# Patient Record
Sex: Female | Born: 1935 | Race: White | Hispanic: No | Smoking: Never smoker
Health system: Southern US, Community
[De-identification: ages and names within clinical notes are randomized; demographics above are authoritative.]

## PROBLEM LIST (undated history)

## (undated) DIAGNOSIS — E43 Unspecified severe protein-calorie malnutrition: Secondary | ICD-10-CM

## (undated) DIAGNOSIS — K449 Diaphragmatic hernia without obstruction or gangrene: Secondary | ICD-10-CM

## (undated) DIAGNOSIS — D369 Benign neoplasm, unspecified site: Secondary | ICD-10-CM

## (undated) DIAGNOSIS — E785 Hyperlipidemia, unspecified: Secondary | ICD-10-CM

## (undated) DIAGNOSIS — Z800 Family history of malignant neoplasm of digestive organs: Secondary | ICD-10-CM

## (undated) DIAGNOSIS — I10 Essential (primary) hypertension: Secondary | ICD-10-CM

## (undated) DIAGNOSIS — I4892 Unspecified atrial flutter: Secondary | ICD-10-CM

## (undated) DIAGNOSIS — Z950 Presence of cardiac pacemaker: Secondary | ICD-10-CM

## (undated) DIAGNOSIS — I495 Sick sinus syndrome: Secondary | ICD-10-CM

## (undated) DIAGNOSIS — K5730 Diverticulosis of large intestine without perforation or abscess without bleeding: Secondary | ICD-10-CM

## (undated) DIAGNOSIS — I480 Paroxysmal atrial fibrillation: Secondary | ICD-10-CM

## (undated) HISTORY — DX: Hyperlipidemia, unspecified: E78.5

## (undated) HISTORY — DX: Presence of cardiac pacemaker: Z95.0

## (undated) HISTORY — PX: ABDOMINAL SURGERY: SHX537

## (undated) HISTORY — DX: Unspecified atrial flutter: I48.92

## (undated) HISTORY — DX: Paroxysmal atrial fibrillation: I48.0

## (undated) HISTORY — DX: Sick sinus syndrome: I49.5

## (undated) HISTORY — PX: ESOPHAGOGASTRODUODENOSCOPY: SHX1529

## (undated) HISTORY — DX: Family history of malignant neoplasm of digestive organs: Z80.0

## (undated) HISTORY — DX: Diverticulosis of large intestine without perforation or abscess without bleeding: K57.30

## (undated) HISTORY — DX: Diaphragmatic hernia without obstruction or gangrene: K44.9

## (undated) HISTORY — DX: Benign neoplasm, unspecified site: D36.9

## (undated) HISTORY — DX: Essential (primary) hypertension: I10

## (undated) HISTORY — PX: PARTIAL HYSTERECTOMY: SHX80

---

## 2001-06-19 ENCOUNTER — Ambulatory Visit (HOSPITAL_COMMUNITY): Admission: RE | Admit: 2001-06-19 | Discharge: 2001-06-19 | Payer: Self-pay | Admitting: Family Medicine

## 2001-06-19 ENCOUNTER — Encounter: Payer: Self-pay | Admitting: Family Medicine

## 2001-11-23 ENCOUNTER — Inpatient Hospital Stay (HOSPITAL_COMMUNITY): Admission: EM | Admit: 2001-11-23 | Discharge: 2001-11-25 | Payer: Self-pay | Admitting: Emergency Medicine

## 2001-11-24 ENCOUNTER — Encounter: Payer: Self-pay | Admitting: Cardiology

## 2001-11-28 ENCOUNTER — Ambulatory Visit (HOSPITAL_COMMUNITY): Admission: RE | Admit: 2001-11-28 | Discharge: 2001-11-28 | Payer: Self-pay | Admitting: Internal Medicine

## 2002-05-09 ENCOUNTER — Ambulatory Visit (HOSPITAL_COMMUNITY): Admission: RE | Admit: 2002-05-09 | Discharge: 2002-05-09 | Payer: Self-pay | Admitting: Internal Medicine

## 2002-06-26 ENCOUNTER — Ambulatory Visit (HOSPITAL_COMMUNITY): Admission: RE | Admit: 2002-06-26 | Discharge: 2002-06-26 | Payer: Self-pay | Admitting: Family Medicine

## 2002-06-26 ENCOUNTER — Encounter: Payer: Self-pay | Admitting: Family Medicine

## 2003-08-28 ENCOUNTER — Encounter: Payer: Self-pay | Admitting: Family Medicine

## 2003-08-28 ENCOUNTER — Ambulatory Visit (HOSPITAL_COMMUNITY): Admission: RE | Admit: 2003-08-28 | Discharge: 2003-08-28 | Payer: Self-pay | Admitting: Family Medicine

## 2003-11-14 HISTORY — PX: HIATAL HERNIA REPAIR: SHX195

## 2004-03-16 ENCOUNTER — Ambulatory Visit (HOSPITAL_COMMUNITY): Admission: RE | Admit: 2004-03-16 | Discharge: 2004-03-16 | Payer: Self-pay | Admitting: Internal Medicine

## 2004-09-09 ENCOUNTER — Ambulatory Visit (HOSPITAL_COMMUNITY): Admission: RE | Admit: 2004-09-09 | Discharge: 2004-09-09 | Payer: Self-pay | Admitting: Family Medicine

## 2005-08-25 ENCOUNTER — Ambulatory Visit (HOSPITAL_COMMUNITY): Admission: RE | Admit: 2005-08-25 | Discharge: 2005-08-25 | Payer: Self-pay | Admitting: Family Medicine

## 2005-10-27 ENCOUNTER — Ambulatory Visit (HOSPITAL_COMMUNITY): Admission: RE | Admit: 2005-10-27 | Discharge: 2005-10-27 | Payer: Self-pay | Admitting: Family Medicine

## 2005-11-17 ENCOUNTER — Encounter: Admission: RE | Admit: 2005-11-17 | Discharge: 2005-11-17 | Payer: Self-pay | Admitting: Surgery

## 2005-11-28 ENCOUNTER — Emergency Department (HOSPITAL_COMMUNITY): Admission: EM | Admit: 2005-11-28 | Discharge: 2005-11-28 | Payer: Self-pay | Admitting: Emergency Medicine

## 2006-04-03 ENCOUNTER — Inpatient Hospital Stay (HOSPITAL_COMMUNITY): Admission: RE | Admit: 2006-04-03 | Discharge: 2006-04-06 | Payer: Self-pay | Admitting: Surgery

## 2006-06-28 HISTORY — PX: RADIOFREQUENCY ABLATION: SHX2290

## 2006-11-02 ENCOUNTER — Ambulatory Visit (HOSPITAL_COMMUNITY): Admission: RE | Admit: 2006-11-02 | Discharge: 2006-11-02 | Payer: Self-pay | Admitting: Family Medicine

## 2007-02-08 ENCOUNTER — Ambulatory Visit (HOSPITAL_COMMUNITY): Admission: RE | Admit: 2007-02-08 | Discharge: 2007-02-08 | Payer: Self-pay | Admitting: Family Medicine

## 2008-04-30 ENCOUNTER — Ambulatory Visit (HOSPITAL_COMMUNITY): Admission: RE | Admit: 2008-04-30 | Discharge: 2008-04-30 | Payer: Self-pay | Admitting: Family Medicine

## 2009-01-08 ENCOUNTER — Ambulatory Visit (HOSPITAL_COMMUNITY): Admission: RE | Admit: 2009-01-08 | Discharge: 2009-01-08 | Payer: Self-pay | Admitting: Family Medicine

## 2009-02-17 ENCOUNTER — Encounter (INDEPENDENT_AMBULATORY_CARE_PROVIDER_SITE_OTHER): Payer: Self-pay | Admitting: *Deleted

## 2009-04-22 ENCOUNTER — Ambulatory Visit (HOSPITAL_COMMUNITY): Admission: RE | Admit: 2009-04-22 | Discharge: 2009-04-22 | Payer: Self-pay | Admitting: Family Medicine

## 2010-07-29 ENCOUNTER — Ambulatory Visit: Payer: Self-pay | Admitting: Internal Medicine

## 2010-08-15 ENCOUNTER — Ambulatory Visit (HOSPITAL_COMMUNITY): Admission: RE | Admit: 2010-08-15 | Discharge: 2010-08-15 | Payer: Self-pay | Admitting: Family Medicine

## 2010-08-17 ENCOUNTER — Ambulatory Visit (HOSPITAL_COMMUNITY): Admission: RE | Admit: 2010-08-17 | Discharge: 2010-08-17 | Payer: Self-pay | Admitting: Internal Medicine

## 2010-08-17 ENCOUNTER — Ambulatory Visit: Payer: Self-pay | Admitting: Internal Medicine

## 2010-08-17 DIAGNOSIS — K219 Gastro-esophageal reflux disease without esophagitis: Secondary | ICD-10-CM | POA: Insufficient documentation

## 2010-08-17 HISTORY — PX: COLONOSCOPY: SHX174

## 2010-08-19 ENCOUNTER — Encounter: Payer: Self-pay | Admitting: Internal Medicine

## 2010-08-23 ENCOUNTER — Ambulatory Visit (HOSPITAL_COMMUNITY): Admission: RE | Admit: 2010-08-23 | Discharge: 2010-08-23 | Payer: Self-pay | Admitting: Internal Medicine

## 2010-08-30 ENCOUNTER — Encounter: Payer: Self-pay | Admitting: Internal Medicine

## 2010-12-04 ENCOUNTER — Encounter: Payer: Self-pay | Admitting: Family Medicine

## 2010-12-13 NOTE — Letter (Signed)
Summary: EGD/TCS ORDER  EGD/TCS ORDER   Imported By: Ave Filter 07/29/2010 14:33:45  _____________________________________________________________________  External Attachment:    Type:   Image     Comment:   External Document

## 2010-12-13 NOTE — Letter (Signed)
Summary: DR Carolynn Sayers REFERRAL  DR Carolynn Sayers REFERRAL   Imported By: Ave Filter 08/30/2010 10:08:12  _____________________________________________________________________  External Attachment:    Type:   Image     Comment:   External Document

## 2010-12-13 NOTE — Letter (Signed)
Summary: Patient Notice, Colon Biopsy Results  [facility]  [address]   [place], [place] [address]   Phone: [phone]  Fax: [phone]       August 19, 2010   Anne Anthony [address] [place], [place]  [address] 07-27-1936    Dear [name],  I am pleased to inform you that the biopsies taken during your recent colonoscopy did not show any evidence of cancer upon pathologic examination.  Additional information/recommendations:  You should have a repeat colonoscopy examination  in 5 years.  Please call us if you are having persistent problems or have questions about your condition that have not been fully answered at this time.  Sincerely,    R. Roetta Sessions MD, FACP [facility] Ph: [phone]    Fax: [phone]   Appended Document: Patient Notice, Colon Biopsy Results letter mailed to pt  Appended Document: Patient Notice, Colon Biopsy Results REMINDER IN COMPUTER

## 2010-12-13 NOTE — Assessment & Plan Note (Signed)
Summary: GERD/ACID REFLUX/SS   Visit Type:  Initial Consult Primary Care Provider:  golding  Chief Complaint:  gerd.  History of Present Illness: 75 year old lady referred by Dr. Phillips Odor  to further evaluate "heartburn". Long history of GERD. History of hiatal hernia and paraesophageal hernia. Status post attempted laparoscopic repair by Dr. Carolynn Sayers at [facility] back in 2005; procedure had to be aborted because of  ST segment changes on EKG caused by pneumoperitoneum. A gastropexy was performed with a fixation type gastrostomy tube as a fall back maneuver..  Prior history of dysphagia / status post Maloney dilation by me. Currently not having any  dysphagia whatsoever. She denies typical reflux symptoms. She does state that over the past year or so she's had trouble with intermittent episodes of postprandial vomiting and nausea. The latter symptom may persist for a day or so. She cites 2-3 episodes monthly. At other times, she remains really entirely asymptomatic. She has been on Prilosec 20 mg orally daily for the last year or so and this agent  has not affected her symptoms in any way. She denies constipation,  rectal bleeding or melena. She has personal history of a colonic adenoma removed back in 2003 by me. She has a positive family history of a  brother diagnosed with colon cancer at [place] age. It is notable she has not had a followup colonoscopy.  Preventive Screening-Counseling & Management  Alcohol-Tobacco     Smoking Status: never  Current Medications (verified): 1)  Simvastatin 40 Mg Tabs (Simvastatin) .... Once Daily 2)  Amlodipine Besylate 5 Mg Tabs (Amlodipine Besylate) .... Once Daily 3)  Metformin Hcl 500 Mg Tabs (Metformin Hcl) .... Once Daily 4)  Lisinopril 10 Mg Tabs (Lisinopril) .... Once Daily 5)  Aspirin 81 Mg Tbec (Aspirin) .... Once Daily 6)  Prilosec 20 Mg Cpdr (Omeprazole) .... Once Daily 7)  Fish Oil 1000 Mg Caps (Omega-3 Fatty Acids) .... Once Daily 8)  Vitamin D  3  400iu .... Once Daily  Allergies (verified): No Known Drug Allergies  Past History:  Past Medical History: skin cancer Diabetes GERD Hyperlipidemia Hypertension  Past Surgical History: tubal ligation partial hyst diaphram ? heart surgery [facility]cs 6 years ago  Family History: Father:deceased  Mother:deceased  Siblings: 4 brothers, no sisters No FH of Colon Cancer:  Social History: Marital Status: widow Children: 4 Occupation: retired Patient has never smoked.  Alcohol Use - no Smoking Status:  never  Vital Signs:  Patient profile:   75 year old female Height:      64 inches Weight:      121 pounds BMI:     20.84 Temp:     98.8 degrees F oral Pulse rate:   80 / minute BP sitting:   128 / 80  (left arm) Cuff size:   regular  Vitals Entered By: Hendricks Limes LPN (July 29, 2010 11:05 AM)  Physical Exam  General:  Well developed, well nourished, no acute distress. Eyes:  no sckeral icterus Neck:  Supple; no masses or thyromegaly. Lungs:  clear Heart:  rrs m/g/r Abdomen:  flat; bs , soft, nontendrr s mass or organomeagaly  Impression & Recommendations: Impression: Very pleasant 75 year old lady with a history of intermittent postprandial vomiting and protracted nausea. She is status post partial repair of hiatal hernia and paraesophageal  hernia with a gastropexy and fixation gastrostomy over at [facility] several years ago. She has no dysphagia. Her symptoms are somewhat unusual for gastroesophageal reflux disease although she may well have this  disease. Her symptoms really do not seem biliary to me. She has no lower GI tract symptoms. She is overdue for surveillance colonoscopy.  Recommendations: Diagnostic EGD and surveillance colonoscopy in the very near future. The risks, benefits, limitations, alternatives and imponderables have been reviewed. Her questions were answered. She is agreeable. We'll make further recommendations in the very near future.  Thank  to Dr. Phillips Odor for his kind referral of this nicely lady once again.  Appended Document: Orders Update    Clinical Lists Changes  Problems: Added new problem of GERD (ICD-530.81) Added new problem of NEOPLASM, MALIGNANT, COLORECTAL, FAMILY HX (ICD-V16.0) Orders: Added new Service order of New Patient Level IV (61607) - Signed

## 2011-03-31 NOTE — Discharge Summary (Signed)
NAME:  Anne Anthony, Anne Anthony               ACCOUNT NO.:  [id number]   MEDICAL RECORD NO.:  [id number]          PATIENT TYPE:  INP   LOCATION:  1608                         FACILITY:  [facility]   PHYSICIAN:  Sandria Bales. Ezzard Standing, M.D.  DATE OF BIRTH:  August 29, 1936   DATE OF ADMISSION:  04/03/2006  DATE OF DISCHARGE:  04/06/2006                                 DISCHARGE SUMMARY   DISCHARGE DIAGNOSES:  1.  Large hiatal hernia.  2.  Paroxysmal atrial flutter.  3.  Hypertension.  4.  Hyperlipidemia.   OPERATIONS PERFORMED:  Patient had laparoscopic repair of hiatal hernia with  biologic mesh and upper endoscopy on the 22nd of May 2007.   HISTORY OF ILLNESS:  Anne Anthony is a 75 year old white female who  underwent an attempt at hiatal hernia repair by Dr. Francee Gentile at [facility] in May of 2005.  Apparently during the procedure she had a cardiac  difficulty which limited the procedure he could do.  He ended up putting a  single stitch on the posterior crura and endoscopic gastrostomy, but could  not actually repair her hernia.   At that time the hiatal hernia measured 4 x 7 cm.  She has, however, had  progressive symptoms of dysphagia, bloating, nausea, and upper GI which  shows that about 80% of her stomach is now in this hiatal hernia.   She comes for intensive hiatal hernia repair during this hospitalization.  She has been evaluated by Dr. Yates Decamp and found to have paroxysmal atrial  tachycardia/fibrillation.  Has been placed on Coumadin for this which she  stopped about five days before surgery.   On the day of surgery she presented to the hospital.  Her coags were checked  and returned to normal with a PT of 59, INR 1.2, PTT of 27.  Her hemoglobin  was 14, hematocrit 42.   She was taken to the operating room where she underwent attempted  laparoscopic repair of her hiatal hernia.  What was found at the time of  surgery was a large hiatal defect which measured about 6 x 7 cm in  size.  Her stomach probably 70-80% of it was above this hiatus.  The gastrostomy  tube that had been placed stretch on the stomach but it had done very little  as far as preventing herniation of the stomach above the diaphragm into the  hiatal hernia.   I spent several hours trying to get down the stomach and mobilize and  visualize the esophagus but basically the esophagus and proximal stomach  were densely adhered in the mediastinum well above the upper part of the  heart and working through this hiatus laparoscopically.  It seemed  impractical to me to try to reduce the entire stomach or even lengthen the  esophagus enough to get it down to the hiatus.  I did endoscope the patient  during the procedure.  This showed an angulation of the esophagus into the  stomach and actually retained food material in the proximal stomach ouch.   Therefore, I reduced as much of the stomach down  as I could below the  hiatus.  I was estimating that I had about 70-80% of the stomach below the  hiatus, maybe 20-30% above the hiatus, and then closed the hiatus with an 8  x 12 cm patch of MTF mesh which is a biologic mesh.   This was also sewn to the stomach to try to prevent it from herniating back  up into this hiatus.   The patient tolerated procedure well, but was kept in the intensive care  unit for 24 hours because of her prior cardiac difficulty.  On the first  postoperative day I did an upper GI.  This showed poor distal gastric  motility, angulation of her distal esophagus, a majority of her stomach  still remained above her hiatus.  So what stomach I thought I had reduced  was not reduce as much as I thought but the stomach did seem to empty  through the hiatal repair.   She was started on liquids on the second postoperative day.  By the third  postoperative day I placed her on a puree diet.  I think she is ready to go  home.  Her temperature is 98.1, pulse is 66, blood pressure is 137/71.   Her  white blood count was down to 11,200, hemoglobin 11.6, hematocrit 34.   Her lungs are clear.  Her incisions look good.  She is, again, tolerating  the puree diet and she is ready for discharge.   DISCHARGE INSTRUCTIONS:  Resuming her home medications which include her  Coumadin on a previous dose, the Toprol XL 100 mg p.o. daily, Lipitor 20 mg  q.h.s., and Prevacid 30 mg twice a day.  She is given Vicodin for pain.  She  is going to see me back in a week for staple removal.  She should make  appointment to see Dr. Jacinto Halim within seven to 14 days.  I think she has an  appointment the 2nd of June at [facility] for check her coags.   I certainly think as an overall summary that an abdominal approach to her  hiatal hernia is impracticable and if there was ever a consideration for  trying to reduce or get her stomach back down below her diaphragm it would  take a thoracic approach which at her age with her cardiac disease I do not  think it would be very desirable and probably keeping her some type of  modified diet which is liquid and pureed food may be the best as far as to  keep her stomach empty and limit her symptoms.  I think she also needs to be  on long-term proton-pump inhibitors and we discussed this with her.      Sandria Bales. Ezzard Standing, M.D.  Electronically Signed     DHN/MEDQ  D:  04/06/2006  T:  04/06/2006  Job:  [id number]   cc:   Patrica Duel, M.D.  Fax: [phone]   Cristy Hilts. Jacinto Halim, MD  Fax: [phone]

## 2011-03-31 NOTE — Op Note (Signed)
NAME:  Anne Anthony, Anne Anthony                         ACCOUNT NO.:  [id number]   MEDICAL RECORD NO.:  [id number]                   PATIENT TYPE:  AMB   LOCATION:  DAY                                  FACILITY:  [facility]   PHYSICIAN:  R. Roetta Sessions, M.D.              DATE OF BIRTH:  Mar 12, 1936   DATE OF PROCEDURE:  03/16/2004  DATE OF DISCHARGE:                                 OPERATIVE REPORT   PROCEDURE:  Diagnostic esophagogastroduodenoscopy.   INDICATIONS FOR PROCEDURE:  Patient is a 75 year old lady with a history of  progressive early satiety and dysphagia.  In January, 2003 I saw this lady  for dysphagia.  I felt she had an elastic-appearing LES on EGD.  She  underwent passage of a 33 French Maloney dilator with good improvement until  the past few months.  EGD is now being done.  This approach has been  discussed with the patient at length.  The potential risks, benefits and  alternatives have been reviewed, questions answered.   PROCEDURE NOTE:  The O2 saturation, blood pressure, pulse, respirations were  monitored throughout the entire procedure.   CONSCIOUS SEDATION:  1. Versed 2 mg IV.  2. Demerol 50 mg IV.  3. Cetacaine spray for topical oropharyngeal anesthesia.   INSTRUMENT:  Olympus video endoscopy system.   FINDINGS ON EXAMINATION OF THE TUBULAR ESOPHAGUS:  Revealed subjectively a  dilated esophagus and with lack of peristalsis the EG junction was wide  open.  There was no evidence of ring, stricture, neoplasm.  The mucosa  appeared normal.  EG junction easily traversed.  The remaining stomach,  colon, gastric cavity was empty, it insufflated.  There appeared to be a  large at least diaphragmatic hernia with a diaphragmatic hiatus noted down  at the level of the antrum.  There was a good part of the partial proximal  stomach, folded up back, best seen retroflexed proximally.  The mucosa  appeared normal.  The pylorus was identified and appeared patent, was easily  traversed.  Examination of the bulb and second portion revealed no  abnormalities.   THERAPEUTIC/DIAGNOSTIC MANEUVERS PERFORMED:  None.   Patient tolerated the procedure well, was __________ endoscopy.   IMPRESSION:  1. Somewhat dilated aperistaltic esophagus (endoscopic observation), normal     appearing mucosa.  2. Large hernia.  She may have a mixed hiatal and paraesophageal hernia.     Mucosa of the stomach appeared normal with patent pylorus, normal D1 and     D2.   RECOMMENDATIONS:  1. __________ esophagram/upper GI series to further evaluate the gross     anatomy of her upper GI tract.  Will plan for later today.  2. Will make further recommendations in the near future.      ___________________________________________  Jonathon Bellows, M.D.   RMR/MEDQ  D:  03/16/2004  T:  03/16/2004  Job:  [id number]   cc:   Patrica Duel, M.D.  [address], [address]  [place]  [place] [address]  Fax: [phone]

## 2011-03-31 NOTE — Op Note (Signed)
[facility]  Patient:    Anne Anthony, Anne Anthony Visit Number: [id number] MRN: [id number]          Service Type: END Location: DAY Attending Physician:  Jonathon Bellows Dictated by:   Roetta Sessions, M.D. Proc. Date: 05/09/02 Admit Date:  05/09/2002 Discharge Date: 05/09/2002   CC:         Patrica Duel, M.D.   Operative Report  PROCEDURE:  Colonoscopy and snare polypectomy.  ENDOSCOPIST:  Roetta Sessions, M.D.  INDICATIONS FOR PROCEDURE:  The patient is a 75 year old lady with no lower GI tract symptoms, no prior imaging of her colon, whose younger brother has a history of undergoing a colonic resection for colorectal carcinoma. Colonoscopy is now being done as a high-risk screening maneuver.  This approach has been discussed with the patient previously and again today at the bedside.  Potential risks, benefits and alternatives have been reviewed; the patient is agreeable.  Please see my handwritten H&P for more information.  I feel she is low risk for conscious sedation.  DESCRIPTION OF PROCEDURE:  O2 saturation, blood pressure, pulse and respirations were monitored throughout the entire procedure.  CONSCIOUS SEDATION:  Versed 3 mg IV, Demerol 75 mg IV in divided doses.  INSTRUMENT:  Olympus video chip colonoscopy.  FINDINGS:  Digital rectal exam revealed no abnormalities.  ENDOSCOPIC FINDINGS:  Prep was good.  Rectum:  Examination of the rectal mucosa including a retroflexed view of the anal verge revealed only internal hemorrhoids.  Colon:  Colonic mucosa was surveyed from the rectosigmoid junction through the left, transverse and right colon to the area of the appendiceal orifice, ileocecal valve and cecum.  These structures were well-seen and photographed. From this level, the scope was slowly withdrawn and all previously mentioned mucosal surfaces were again seen.  At 45 cm, there was a 0.75-cm sessile polyp on a fold; please see photos.   This was engaged with a snare and removed with snare cautery and recovered through the scope without difficulty.  No other abnormalities were observed.  The patient tolerated the procedure well and was reacted in endoscopy.  IMPRESSION: 1. Internal hemorrhoids, otherwise, normal rectum. 2. Polyp at 45 cm, removed with snare cautery.  Remainder of colonic mucosa    appeared normal.  RECOMMENDATIONS: 1. No aspirin or arthritis medications for the next 10 days. 2. Follow up on pathology. 3. Further recommendations to follow. Dictated by:   Roetta Sessions, M.D. Attending Physician:  Jonathon Bellows DD:  05/09/02 TD:  05/12/02 Job: [id number]

## 2011-03-31 NOTE — H&P (Signed)
[place]. [facility]  Patient:    Anne Anthony, Anne Anthony Visit Number: [id number] MRN: [id number]          Service Type: MED Location: [phone] Attending Physician:  Ruta Hinds Dictated by:   [facility], [facility]. Admit Date:  11/23/2001 Discharge Date: 11/25/2001   CC:         Jonell Cluck, M.D.  Dr. Marcy Salvo, GI, [place], [place]  Delrae Rend, M.D.  at [facility]   History and Physical  ADMITTING DIAGNOSES: 1. Unstable angina. 2. Hyperlipidemia. 3. Electrocardiogram going in and out of left bundle branch block.  DISCHARGE DIAGNOSES: 1. Chest pain questionable etiology.    a. Status post cardiac catheterization by Dr. Pearletha Furl. Alanda Amass on       November 25, 2001 with no significant coronary artery disease and normal       left ventricular function. 2. Hyperlipidemia. 3. Electrocardiogram going in and out of left bundle branch block.  HISTORY OF PRESENT ILLNESS:  Anne Anthony is a very pleasant 75 year old white female with no known CAD who was seen by Dr. Jacinto Halim several months ago in follow-up of an abnormal EKG.  She says that after seeing him she had an electrocardiogram and Cardiolite both performed, which apparently were negative.  She has no hypertension, no diabetes, no tobacco use, no family history of heart disease but does have hyperlipidemia on Lipitor.  She presented on November 23, 2001.  During that previous night at about 7 p.m., she was eating dinner when she had the onset of chest pain under both breasts with a drawing sensation up into her neck.  She did have some diaphoresis but no shortness of breath.  She developed some nausea and then had two episodes of emesis.  Pain continued under the left breast for about 30 to 60 minutes duration.  She had gone to the bathroom to remove her bra because it felt like it was tight and squeezing.  At that point, she had been at a restaurant  eating dinner.  However, they then went home.  After she got home, she felt fine until she went to bed.  She then woke up at some point during the night but was not having any chest pain or other symptoms.  When she woke up on the morning of November 23, 2001, she felt fine but her daughters wanted her to see Dr. Jonell Cluck and follow up of her episode. Her performed EKG and was concerned about her EKG and chest pain and planned for transfer to [facility].  In [facility] Emergency Room, she was without complaints at that point.  She said that she was having some more episodes of these about every two weeks.  She says they always occur while eating.  She works in [employer] and never has any chest discomfort or other associated symptoms when she is physically exerting herself there.  On exam at that time, her heart rate was 70, blood pressure 138/71.  No significant findings on her physical exam.  The EKG from June 2002 showed left bundle branch block.  The EKG from earlier the morning of admission on November 23, 2001 at 11:47 showed no left bundle branch block but did showed deep T-wave inversions in leads, II, III, aVF, V3 through V6.  Follow-up EKG later on November 23, 2001 on 12:19 showed a left bundle branch block similar to the one on May 06, 2001.  At that point, labs were pending.  At this point, she was seen and evaluated by Dr. Pearletha Furl. Alanda Amass.  It was felt that her symptoms were quite concerning for angina.  EKG showed her going in and out of her left bundle branch block.  As well as she was without bundle branch block, she had deep T-waves which were most likely secondary to polarization changes but could not rule out ischemia.  It was felt that given her significant symptoms and EKG changes that definitive cardiac catheterization was necessary.  As well, she had a negative Cardiolite this past summer and continued  to have problems with chest pain.  At that point, she was treated with aspirin, Plavix, IV heparin, Lopressor, and Lipitor, as well as prophylactically treated with a proton pump.  We checked serial enzymes to rule out MI and she was scheduled for cardiac catheterization on Monday.  HOSPITAL COURSE:  On November 24, 2001, she was without complaints and had no further chest pain.  Laboratories remained stable including negative enzymes. Exam remained benign.  She remained on medications and was awaiting her catheterization on Monday.  On November 25, 2001, Anne Anthony underwent cardiac catheterization by Dr. Pearletha Furl. Alanda Amass.  She was found to have an essentially normal coronaries. She does have some calcifications of the ostial LAD, as well as a 30% proximal mid-LAD lesion.  No other significant disease.  Normal LV function with an EF of 55%.  No MR.  At that time, it was felt that Dr. Pearletha Furl. Alanda Amass that her chest pain was most likely GI in etiology.  He felt that she did not need follow up with cardiology.  She continued her bed rest postcatheterization.  If her groin remained stable, she will be discharged home later this afternoon.  She will need to follow up with Dr. Jonell Cluck and also with gastroenterology in [place], [place].  We will send her home with a baby aspirin, add a proton pump inhibitor in addition to her Lipitor, which was her only previous medication.  She can follow up with [place] only on a p.r.n. basis.  CONSULTANTS:  None.  PROCEDURES:  Cardiac catheterization November 25, 2001 by Dr. Pearletha Furl. Alanda Amass.  This showed essentially normal coronary arteries.  There was some calcification of the proximal LAD.  There was a 30% stenosis in the proximal  mid-LAD.  No other significant CAD.  She had normal LV function with EF of 50-55%.  No MR.  LABORATORY DATA:  TSH normal at 0.909.  Lipid profile shows total cholesterol of 151, triglycerides  76, HDL 51, and LDL 85.  Cardiac enzymes negative x 3 with CKs of 77, 69, 58.  MB of 1.0, 1.2, and less than 0.3.  Troponin is 0.02, 0.01, and 0.01.  On admission, white count 6.9, hemoglobin 13.1, hematocrit 38.5, platelet 313,000.  These remained stable throughout the hospitalization. On September 15, 2002, PT 14.3, INR 1.2, and PTT 32.  On admission, sodium 140, potassium 3.7, glucose 101, BUN 12, creatinine 0.8.  These values all remained stable and normal throughout the hospitalization.  EKG showed intermittent left bundle branch block.  At the time of admission, her EKG from earlier that morning at Dr. Azzie Glatter office, which had been obtained on November 23, 2001 at 11:47, showed no left bundle branch block but did show deep T-wave inversion in leads II, III, aVF, as well as V3 through V6.  Follow-up EKG in  the emergency room on November 23, 2001 at 12:19 shows a left bundle branch block with no significant T-wave changes.  No significant compared to old EKG of May 06, 2001.  Chest x-ray November 24, 2001 shows COPD, slight cardiomegaly, and probable large hiatal hernia.  DISCHARGE MEDICATIONS: 1. Aspirin 81 mg q.d. 2. Lipitor 20 mg q.d. 3. Nexium 40 mg q.d.  ACTIVITY:  No strenuous activity or lifting greater than five pounds or driving for three days.  DIET:  Low salt, low fat, low cholesterol diet.  WOUND CARE:  May gently wash the groin with water and soap.  Call the office at [phone] if any bloody increase or pain in the groin.  FOLLOW-UP:  Follow up with Dr. Jacinto Halim only on a p.r.n. basis.  Call Dr. Azzie Glatter office today to get an appointment to see him in the next one to two weeks.  Also call Dr. Marcy Salvo for gastroenterology evaluation for the first available appointment. Dictated by:   [facility], [facility]. Attending Physician:  Ruta Hinds DD:  11/25/01 TD:  11/25/01 Job: [id number]

## 2011-03-31 NOTE — Op Note (Signed)
NAME:  Anne Anthony, Anne Anthony               ACCOUNT NO.:  [id number]   MEDICAL RECORD NO.:  [id number]          PATIENT TYPE:  INP   LOCATION:  0160                         FACILITY:  [facility]   PHYSICIAN:  Sandria Bales. Ezzard Standing, M.D.  DATE OF BIRTH:  1935-12-19   DATE OF PROCEDURE:  04/03/2006  DATE OF DISCHARGE:                                 OPERATIVE REPORT   PREOPERATIVE DIAGNOSIS:  Large recurrent hiatal hernia.   POSTOPERATIVE DIAGNOSIS:  Large recurrent hiatal hernia with extensive  mediastinal adhesions and approximately 6 x 7 cm hiatal hernia.   PROCEDURE:  Laparoscopic repair of hiatal hernia with 8 x 12 cm piece of MTF  mesh and upper endoscopy.   SURGEON:  Dr. Ezzard Standing   FIRST ASSISTANT:  Dr. Wenda Low   ANESTHESIA:  General with approximately 40 mL of 0.25% Marcaine in the port  sites.   INDICATION FOR PROCEDURE:  Anne Anthony is a 75 year old white female, who  underwent an attempted laparoscopic hiatal hernia repair by Dr. Francee Gentile  in May 2005.  During the procedure, apparently she had some cardiac  difficulty which had made him terminate the procedure before he could  complete it.  He was able to reduce the hiatal hernia.  He put a single  stitch in the posterior crura and then put an endoscopic gastrostomy tube  but could do no other repair of the hernia.   At that time, the hernia measured approximately 4 x 7 cm in his operative  note.  The patient has continued to have symptoms with nausea, vomiting,  burping, and upper GI shows approximately 80% of the stomach through a  hiatal hernia.  She now comes for attempted repair of this hiatal hernia and  reduction of her stomach back into her abdominal cavity.  The indication and  potential risks were explained to the patient.   Potential risks include but are not limited to bleeding, infection,  perforation or injury to the bowel, the inability to reduce the hiatal  hernia, the possibility of another gastrostomy tube,  and she understands  cardiac potential risks as she has been seen by Dr. Zetta Bills, who feels  comfortable she will be able to tolerate surgery.   OPERATIVE NOTE:  The patient placed in a supine position, given a general  endotracheal anesthetic.  Her abdomen was prepped with Betadine solution and  drapes.  She had a Foley catheter in place.  I accessed the abdominal cavity  through an infraumbilical laparoscopic incision in which I placed a 10 mm  laparoscope through a 12 mm Hasson trocar which I secured with a 0 Vicryl  suture.  I then placed a 10 mm port in her right upper quadrant, a 10 mm  port in her right paramedian quadrant, a 10 mm port in her left paramedian  at the old gastrostomy site, and a 5 mm port lateral.  Before placing the  port at the gastrostomy site, I did laparoscope her and took down her prior  gastrostomy tube placement with harmonic scalpel.  It was noted this had  done very little  to prevent the stomach from herniating back up into the  chest.   Then I took down adhesions which were actually not very significant in her  upper abdomen which involved both the undersurface of the liver, up around  the hiatus, and then I got into the mediastinum, and she had a fairly large  hiatal hernia.   I estimated measurement of the hiatal hernia was about 6 x 7 cm (this is the  defect in the diaphragm - the mediastinal sac that the stomach occupied was  much larger), so this was larger than what Dr. Carolynn Sayers had seen 2 years ago.  I was able to reduce approximately 60% of her stomach into her abdominal  cavity, but a lot of her stomach remained densely adhesed in her  mediastinum.  I first went along the lesser curvature side of the stomach  and dissected this way for awhile.  Then I went onto the greater curvature  of her stomach.  I then encircled her stomach and used the penrose for  exposure and dissection.  Somewhat interestingly it appeared that her left  gastric  vessels were pulled up through her hiatus, and this was all  plastered posteriorly.   We probably spent some 2-1/2 to 3 hours dissecting the stomach from the  mediastinum, going first up the lesser curvature, then up the greater  curvature in the mediastinum trying to get the stomach down, but I was never  able to mobilize the upper edges of the stomach or her esophagus which were  extremely high in her medistinum.   At this time, to help identify the esophagogastric junction, Dr. Daphine Deutscher  broke scrub and upper endoscoped the patient.  We switched places and I  upper endoscoped the patient.   I found several things.  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  Secondly, she had a lot of retained  food in her stomach, though she had been NPO.  I am guessing that a large  amount of her stomach in her chest had been very poor at gastric emptying  because of its tortuous nature.  Thirdly, she did have some hyperplastic  polyps in this proximal gastric pouch probably from chronic stasis.  I did  not biopsy or do anything to these polyps because of [place] doing our dissection  on the outside of the stomach.  Fourthly, I was able to get down to the  duodenum and cannulate what I think was the first portion of the duodenum  which was fairly unremarkable.  Her distal stomach was unremarkable, too.  Where I had taken down the gastrostomy, too, was not appreciated on  endoscopy.   At this point, it was clear that her esophagus was really high, probably up  midway in her mediastinum.  It would not be practicable to mobilize the  esophagus enough to get the esophagus down to the hiatus of her diaphragm.  We also thought there was a significant risk of causing an injury either to  the esophagus or stomach in doing so, and then we probably dissected 80% of the stomach free but again,  this prompt top 20% was really an almost  inaccessible area by going laparoscopically through her hiatal hernia.  So  at this point, I felt that the best position was to take what we had as far  as reduced stomach, use  a piece of absorbable mesh around her hiatus, and  make this close the hiatal hernia and tack this down to the stomach to  hopefully fix this piece of stomach so there was only a small part of the  stomach above her diaphragm.  This would then empty better, in a straighter  position when she sits up and eats.   I used an 8 x 12 cm piece of MTF mesh.  I tacked this using Tevdek sutures  along the diaphragm anteriorly but above the right crus laterally.  I then  actually clipped sort of a stellate star in the mesh and passed it  posteriorly so actually it encircled this whole kind of gastric pouch.   There appeared to be plenty of room that I had not made it so tight that  actually it was in itself was going to cause an obstruction.  I then tacked  it anteriorly to the stomach, again, hoping to fix even though I was leaving  some of the stomach above the diaphragm; I thought I had reduced at least  80% and tried to leave this as tube of stomach so this would then empty into  a more distal stomach which hopefully would empty.   I had some thoughts about placing a gastrostomy tube, but my concern was  since I had this mesh in there, I did a fairly large dissection of her  mediastinum which would have some fluid which would accumulate that seat  either the mesh or the fluid and contaminate it, as we had not had any  obvious esophageal or gastric injury and thought that a gastrostomy tube  would add very little as far as fixating the stomach, though it would give  [place] access to her stomach if she were to have some problems with this repair.   For the same reason that the gastrostomy tube did not seem to work, I  thought pexing her stomach would add nothing either and, again, we  were  really hoping that her distal stomach would empty in a normal fashion as  long as food presented to itself that far down.   So I then irrigated out the abdominal cavity.  Again, I had closed this; I  used the MTF mesh with the 2 row Tevdek suture, and I used 2-0 Vicryl suture  on the anterior stomach wall.   The patient tolerated the procedure well, was transported to the recovery  room in good condition.  Sponge and needle counts were correct at the end of  the case.  Each trocar was removed in turn.  The Nathanson retractor which  was used to retract the liver was removed.  The umbilical port closed with 0  Vicryl suture, the skin port was closed with skin staples and injected with  0.25% Marcaine.      Sandria Bales. Ezzard Standing, M.D.  Electronically Signed     DHN/MEDQ  D:  04/03/2006  T:  04/03/2006  Job:  [id number]   cc:   Patrica Duel, M.D.  Fax: [phone]   Cristy Hilts. Jacinto Halim, MD  Fax: [phone]

## 2011-03-31 NOTE — Cardiovascular Report (Signed)
[place]. [facility]  Patient:    Anne Anthony, Anne Anthony Visit Number: [id number] MRN: [id number]          Service Type: MED Location: [phone] Attending Physician:  Ruta Hinds Dictated by:   Pearletha Furl Alanda Amass, M.D. Proc. Date: 11/25/01 Admit Date:  11/23/2001   CC:         Jonell Cluck, M.D., c/o [facility], [place], [place]  Dr. Marline Backbone, c/o [facility], [place], [place]  Dr. Ilene Qua, [place] Office, [facility] Heart   Cardiac Catheterization  PROCEDURES:  Retrograde central aortic catheterization; selective coronary angiography by Judkins technique; left ventricular angiogram, RAO, LAO projection; abdominal aortic angiogram, midstream PA projection.  DESCRIPTION OF PROCEDURE:  The patient was brought to the second floor CHEST PAIN lab in a postabsorptive state after 5 mg Valium p.o. premedication.  She was given 2 mg of Nubain for sedation in the laboratory.  Catheterization was done with 6 [place], 4 cm tapered preformed Cordis coronary and pigtail catheters.  The procedure was done through a 6 [place] short Daig sidearm sheath that we placed in the CFRA with a single anterior puncture under modified Seldinger technique under 1% Xylocaine anesthesia.  LV angiogram was done in the RAO and LAO projection at 25 cc, 14 cc per second, and 20 cc, 12 cc per second, and pullback pressure at the CA showing no gradient across the aortic valve.  Abdominal aortic angiogram in the midstream PA projection was done at 25 cc, 20 cc per second above the level of the renal arteries and demonstrated patent celiac and SMA axis.  Normal single renal arteries bilaterally.  Very minimal atherosclerotic change in the mid-infrarenal abdominal aorta but otherwise smooth, no aneurysm or stenosis.  Normal common and external iliacs bilaterally with good runoff and intact hypogastric arteries.  The renal arteries were single and normal  bilaterally.  Catheters were removed, sidearm sheath was flushed.  The patient was brought to the holding area for sheath removal and pressure hemostasis.  Heparin was on hold at the catheterization lab, and ACT was 160 post procedure.  She tolerated the procedure well.  RESULTS:  Pressures:  LV:  134/0; LVEDP 16 mmHg.  CA:  134/55 mmHg.  There was no gradient across the aortic valve on catheter pullback.  Fluoroscopy revealed 2+ segmental calcification of the proximal LAD that was nonobstructive.  There was no significant coronary or intracardiac calcification.  The main left coronary was normal.  The left anterior descending was widely patent and smooth with no significant obstruction.  There was a mild 30% eccentric, smooth narrowing at the level of the second septal perforator at the junction of the proximal third of the LAD. There was good flow to the remainder of the LAD, which coursed to the apex of the heart and was normal.  There was a large twin first diagonal arising from the proximal third of the LAD just after its origin.  It trifurcated distally and was widely patent and normal.  The circumflex was a nondominant vessel with a moderate-sized normal OM-1 from the proximal portion, a small OM-2 from the midportion, and a normal distal circumflex and PAVG branch.  The right coronary was a moderately large, dominant vessel with a normal PDA arising just after the acute margin, normal PLA giving off an AV node branch, and normal RV branches from the midportion.  It was a widely patent, smooth, normal artery.  There was no coronary spasm visualized,  and there was good flow throughout the coronary tree bilaterally.  DISCUSSION:  This 75 year old married mother of four with four grandchildren is a nonsmoker.  She has a history of mild hypertension, hyperlipidemia on therapy, and is a nonsmoker.  She was seen by Dr. Nobie Putnam for atypical chest pain and predominant upper  GI reflux symptoms and possible symptoms of esophageal spasm or stricture.  She did, however, have intermittent left bundle branch block on EKG and with normal conduction had marked T-wave inversion.  It is not clear whether this was secondary to repolarization from her intermittent LBBB or related to ischemia.  Fortunately, the patient has essentially normal coronary arteries.  Left ventricle is essentially normal with an EF of 50-55% and no mitral regurgitation.  Chest x-ray shows a large hiatal hernia.  I would recommend empiric GI therapy at this point, reassurance about her coronary status, and probable consideration for GI consultation.  CATHETERIZATION DIAGNOSES: 1. Chest pain, etiology not determined, probable upper GI etiology. 2. Hiatal hernia on x-ray with symptoms of gastroesophageal reflux disease and    possible stricture. 3. Mild hypertension with normal renal arteries. 4. Essentially normal coronary arteries and left ventricle. 5. Hyperlipidemia, normalized on medical therapy. Dictated by:   Pearletha Furl Alanda Amass, M.D. Attending Physician:  Ruta Hinds DD:  11/26/71 TD:  11/25/01 Job: [id number]

## 2011-03-31 NOTE — Op Note (Signed)
[facility]  Patient:    Anne Anthony, Anne Anthony Visit Number: [id number] MRN: [id number]          Service Type: END Location: DAY Attending Physician:  Jonathon Bellows Dictated by:   Roetta Sessions, M.D. Proc. Date: 11/28/01 Admit Date:  11/28/2001 Discharge Date: 11/28/2001   CC:         Patrica Duel, M.D.   Operative Report  PROCEDURE:  Esophagogastroduodenoscopy with [facility] dilation.  GASTROENTEROLOGIST:  Roetta Sessions, M.D.  INDICATIONS:  The patient is an 75 year old lady who complains of intermittent esophageal silence and has had some recent atypical chest pain.  She was seen by cardiology and describes undergoing cardiac catheterization just three days ago down in [place], and she was told that she did not have any significant coronary disease.  She was started on Nexium three days ago.  She cannot tell any difference in her symptoms.  She currently does not have any chest pain this morning.  She denies any typical reflux symptoms.  Gallbladder remains intact.  Recent labs demonstrate essentially normal CBC and normal LFTs.  EGD is now being done to further evaluate here chest pain and esophageal dysphagia.  [name].  Potential risks, benefits, and alternatives have been reviewed and questions answered. She is agreeable.  Please see my handwritten H&P for more information.  PROCEDURE NOTE:  O2 saturation, blood pressure, and pulse for this patient were monitored throughout the entire procedure.  CONSCIOUS SEDATION:  Versed 3 mg IV, Demerol 50 mg IV in divided doses.  INSTRUMENT:  Olympus video chip gastroscope.  FINDINGS:   Examination of the tubular esophagus revealed no mucosal abnormality.  EG junction did have somewhat of an elastic feel, but no definite ring stricture was seen.  There was no evidence of esophagitis, neoplasm, Barretts esophagus.  STOMACH:  Gastric cavity was insufflated  well with air and was empty. Thorough examination of gastric mucosa including a retroflexed view of the proximal stomach, esophagogastric junction demonstrated only a moderate size hiatal hernia.  Pylorus was patent.  DUODENUM: Examination of the bulb revealed posterior bulbar erosions, no frank ulcer or crater.  Second portion appeared normal.  THERAPEUTIC/DIAGNOSTIC MANEUVERS PERFORMED:  A 56-French Maloney dilator was placed to full insertion with good patient tolerance without blood on the dilator.  Look back revealed on apparent complications related to passage of the dilator.  The patient tolerated the procedure well and was reacted in endoscopy.  IMPRESSION: 1. Slightly elastic-appearing ileus.  No definite ring or stricture seen.    The remainder of the esophagus appeared normal status post passage of    56-French [facility] dilator. 2. Moderately large size hiatal hernia.  The remainder of her stomach appeared    normal. 3. Posterior bulbar erosions of uncertain significance.  Remainder of the    bulb and second portion appeared normal.  RECOMMENDATIONS: 1. Continue Nexium 40 mg orally daily. 2. Check H. pylori serologies today. 3. Followup appointment with [place] in two to three weeks to assess her progress. 4. Further recommendations to follow.Dictated by:   Roetta Sessions, M.D.  Attending Physician:  Jonathon Bellows DD:  11/28/01 TD:  11/30/01 Job: [id number]

## 2011-03-31 NOTE — H&P (Signed)
NAME:  Anne Anthony, Anne Anthony                           ACCOUNT NO.:  [id number]   MEDICAL RECORD NO.:  [id number]                  PATIENT TYPE:   LOCATION:                                       FACILITY:   PHYSICIAN:  R. Roetta Sessions, M.D.              DATE OF BIRTH:  10-26-1936   DATE OF ADMISSION:  03/15/2004  DATE OF DISCHARGE:                                HISTORY & PHYSICAL   CHIEF COMPLAINT:  Early satiety, nausea, and regurgitation.   HISTORY:  [name] is a 75 year old lady who came to see me with a  three-month history of progressive early satiety and dysphagia, who feels  that food does not go all the way down, pointing to her lower retrosternal  area.  She has to regurgitate and vomit.  She says the Nexium is not  working.  She does not really feel that she has typical reflux symptoms.  I  last saw this lady on May 09, 2002, at which time I performed a colonoscopy  for high-risk colorectal cancer screening.  She was found to have a polyp at  45 cm that turned up to be an adenoma.  She is due for surveillance exam in  2006.  Prior to that, EGD with [facility] dilation for dysphagia on November 28, 2001 demonstrated slightly elastic-appearing LES.  No definite ring or  stricture.  I passed a 44 French Maloney dilator, and this was associated  with improvement in dysphagia until the past few months.  It is notable that  she had a large hiatal hernia.  H. pylori serologies were negative.  LFTs  were normal back at that time.  Gallbladder is out.  Has not had any melena  or rectal bleeding.  No odynophagia.  Has not lose any weight, per her  report.  She actually weighs the same as she did in February, 2003.   PAST MEDICAL HISTORY:  Significant for chest pain with negative cardiac  workup.  Past medical history otherwise hypercholesterolemia.  Status post  tubal ligation, partial hysterectomy previously.  Colonoscopy as outlined  above.   CURRENT MEDICATIONS:  1. Lipitor  10 mg daily.  2. Nexium 40 mg daily.  3. Toprol XL 50 mg daily.   CARDIOLOGIST:  Richard A. Alanda Amass, M.D.   FAMILY HISTORY:  Negative for chronic GI or liver disease except for one  brother who had a resection for colon cancer.   SOCIAL HISTORY:  Patient has been married for 46 years with four children.  She has been employed with [employer].  No tobacco or alcohol.   REVIEW OF SYSTEMS:  No chest pain on exertion.  No dyspnea.  No fever,  chills, change in weight.   PHYSICAL EXAMINATION:  VITAL SIGNS:  Weight 153.  Temp 97.4, BP 130/62,  pulse 60.  GENERAL:  A pleasant 75 year old lady resting comfortably.  SKIN:  Warm and dry.  HEENT:  No scleral icterus.  NECK:  JVD is not prominent.  LUNGS:  Clear to auscultation.  HEART:  Regular rate and rhythm without murmur, rub or gallop.  ABDOMEN:  Nondistended.  Positive bowel sounds.  Soft, nontender.  No  appreciable mass or organomegaly.  EXTREMITIES:  No edema.   IMPRESSION:  [name] is a 75 year old lady with recurrent  esophageal dysphagia, early satiety.  Nexium not making much difference with  her symptomatology.  I was concerned in 2003 about the possibility of early  evolving achalasia, but she did have a nice response to passing of a Maloney  dilator, so barium-filled esophagram, or other studies were held off.  In  this setting, she needs to go ahead and have another EGD, depending on what  is found.  May need further evaluation in the way of a barium-filled  esophagram +/- esophageal manometry.  Further recommendations to follow.     ___________________________________________                                         Jonathon Bellows, M.D.   RMR/MEDQ  D:  03/15/2004  T:  03/15/2004  Job:  [id number]   cc:   Patrica Duel, M.D.  [address]  [place]  [place] [address]  Fax: [phone]

## 2011-06-14 HISTORY — PX: OTHER SURGICAL HISTORY: SHX169

## 2011-06-26 ENCOUNTER — Other Ambulatory Visit (HOSPITAL_COMMUNITY): Payer: Self-pay | Admitting: Cardiovascular Disease

## 2011-06-26 ENCOUNTER — Ambulatory Visit (HOSPITAL_COMMUNITY)
Admission: RE | Admit: 2011-06-26 | Discharge: 2011-06-26 | Disposition: A | Discharge status: Home / Self Care | Payer: No Typology Code available for payment source | Source: Ambulatory Visit | Attending: Cardiovascular Disease | Admitting: Cardiovascular Disease

## 2011-06-26 DIAGNOSIS — Z01810 Encounter for preprocedural cardiovascular examination: Secondary | ICD-10-CM

## 2011-06-26 DIAGNOSIS — Z01818 Encounter for other preprocedural examination: Secondary | ICD-10-CM | POA: Insufficient documentation

## 2011-06-29 ENCOUNTER — Ambulatory Visit (HOSPITAL_COMMUNITY)
Admission: RE | Admit: 2011-06-29 | Discharge: 2011-06-30 | Disposition: A | Discharge status: Home / Self Care | Payer: No Typology Code available for payment source | Source: Ambulatory Visit | Attending: Cardiovascular Disease | Admitting: Cardiovascular Disease

## 2011-06-29 DIAGNOSIS — I495 Sick sinus syndrome: Secondary | ICD-10-CM | POA: Insufficient documentation

## 2011-06-29 DIAGNOSIS — Z01818 Encounter for other preprocedural examination: Secondary | ICD-10-CM | POA: Insufficient documentation

## 2011-06-29 DIAGNOSIS — I471 Supraventricular tachycardia, unspecified: Secondary | ICD-10-CM | POA: Insufficient documentation

## 2011-06-29 DIAGNOSIS — I10 Essential (primary) hypertension: Secondary | ICD-10-CM | POA: Insufficient documentation

## 2011-06-29 DIAGNOSIS — E785 Hyperlipidemia, unspecified: Secondary | ICD-10-CM | POA: Insufficient documentation

## 2011-06-29 DIAGNOSIS — E119 Type 2 diabetes mellitus without complications: Secondary | ICD-10-CM | POA: Insufficient documentation

## 2011-06-29 LAB — GLUCOSE, CAPILLARY
Glucose-Capillary: 113 mg/dL — ABNORMAL HIGH (ref 70–99)
Glucose-Capillary: 106 mg/dL — ABNORMAL HIGH (ref 70–99)
Glucose-Capillary: 101 mg/dL — ABNORMAL HIGH (ref 70–99)

## 2011-06-29 LAB — SURGICAL PCR SCREEN: Staphylococcus aureus: NEGATIVE

## 2011-06-30 ENCOUNTER — Ambulatory Visit (HOSPITAL_COMMUNITY): Payer: No Typology Code available for payment source

## 2011-06-30 LAB — GLUCOSE, CAPILLARY
Glucose-Capillary: 124 mg/dL — ABNORMAL HIGH (ref 70–99)
Glucose-Capillary: 100 mg/dL — ABNORMAL HIGH (ref 70–99)

## 2011-07-03 NOTE — Op Note (Signed)
NAME:  Anne Anthony, Anne Anthony               ACCOUNT NO.:  [id number]  MEDICAL RECORD NO.:  [id number]  LOCATION:  2009                         FACILITY:  [facility]  PHYSICIAN:  Thurmon Fair, MD     DATE OF BIRTH:  [birth year]  DATE OF PROCEDURE: DATE OF DISCHARGE:                              OPERATIVE REPORT   PROCEDURES PERFORMED: 1. Implantation of new dual-chamber permanent pacemaker. 2. Left upper extremity venogram. 3. Fluoroscopy. 4. Moderate sedation.  REASON FOR THE PROCEDURE: 1. Sinus node dysfunction with symptomatic bradycardia. 2. Paroxysmal atrial tachycardia with the bradycardia due to necessary     medications. 3. Tachycardia-bradycardia syndrome.  [name] is a 75 year old woman with recurrent episodes of paroxysmal supraventricular tachycardia, the treatment of which has been difficult due to marked bradycardia and sinus pauses in response to necessary medications.  After risks and benefits of the procedure were discussed, the patient provided informed consent, was brought to the cardiac cath lab in the fasting state.  The left pectoral area was prepped and draped in usual sterile fashion.  Local anesthesia of 1% lidocaine was administered.  A 6-cm horizontal incision was made parallel with and roughly 2-3 cm caudal to the left clavicle.  Using electrocautery and blunt dissection, a prepectoral pocket was created to the level of the pectoralis major muscle fascia.  The pocket was inspected for hemostasis which was found to be excellent.  An antibiotic soaked sponge was placed in the pocket.  Attempts at access to the subclavian vein were unsuccessful and on one occasion suspicion of a pulmonary injury with air being pulled back then the syringe occurred.  A left upper extremity venogram was performed and subsequently successful access to the left subclavian vein was obtained with two separate venipunctures.  Two separate J-tipped guidewires were placed and  subsequently exchanged for 7-French safe sheath pocket.  The ventricular lead was advanced to level of the mid to apical right ventricular septum.  The active fixation helix was deployed and there was prominent current of injury.  Satisfactory sensing and pacing parameters were noted.  The safe sheath was peeled away and the lead was secured in place using 2-0 silk.  The pacing at maximum device output did not produce any diaphragmatic/phrenic nerve stimulation.  In the similar fashion, the right atrial lead was advanced to level of right atrial appendage.  The active fixation helix was deployed and there was prominent current of injury.  Pacing at maximum device output did not produce any diaphragmatic/phrenic nerve stimulation. Satisfactory sensing and pacing parameters were encountered.  The safe sheath was peeled away and the lead was secured in place using 2-0 silk.  The antibiotic soaked sponge was removed from the pocket and the pocket was flushed with copious amounts of antibiotic solution after which reinspection fills showed excellent hemostasis.  The pacemaker generator was connected to ventricular lead with appropriate ventricular pacing and subsequently to the atrial lead with atrial sensed ventricular paced rhythm being noted.  The generator was then placed in the pocket with care being taken that the leads be located deep to the generator than at the entire system have a comfortable position.  The incision was then closed  in layers using two layers of 2-0 silk and cutaneous staples.  A sterile pressure dressing was then applied.  No immediate complications occurred.  At the end of the procedure, the following electronic parameters were encountered.  Right atrial lead sensed P-waves 2.1 mV, impedance 727 ohms, threshold 0.4 volts at 0.5 milliseconds pulse width.  Right ventricular lead sensed R-waves 10.3 mV, impedance 841 ohms, threshold 0.6 volts at 0.4  milliseconds pulse width.     Thurmon Fair, MD     MC/MEDQ  D:  06/29/2011  T:  06/29/2011  Job:  [id number]  cc:   [facility] [place]  Electronically Signed by Thurmon Fair M.D. on 07/03/2011 06:32:48 PM

## 2011-07-12 NOTE — Discharge Summary (Signed)
  NAME:  Anne Anthony, YTUARTE               ACCOUNT NO.:  [id number]  MEDICAL RECORD NO.:  [id number]  LOCATION:  2009                         FACILITY:  [facility]  PHYSICIAN:  Thurmon Fair, MD     DATE OF BIRTH:  27-Jul-1936  DATE OF ADMISSION:  06/29/2011 DATE OF DISCHARGE:  06/30/2011                              DISCHARGE SUMMARY   DISCHARGE DIAGNOSES: 1. Sick sinus syndrome. 2. Elective Medtronic pacemaker implant this admission. 3. Treated dyslipidemia. 4. Treated hypertension. 5. Type 2 non-insulin-dependent diabetes.  HOSPITAL COURSE:  The patient is a pleasant 75 year old female who has had episodes of presyncope.  She has had remote history of atrial flutter and ablation performed in 2006.  Echocardiogram as an outpatient showed good LV function.  She had a CardioNet monitor as an outpatient that showed bradycardia with heart rates in the 40s.  It was felt that she would be best served with a pacemaker of her sick sinus syndrome. She also had episode of PAT while wearing the monitor.  She was admitted on June 29, 2011, for an elective pacemaker implant.  This was done by [name].  She tolerated the procedure well.  Postoperatively, device checked out fine.  Chest x-ray shows no pneumothorax.  We feel she can be discharged on June 30, 2011.  She will follow up with [name] in a week for site check.  I should note that she did have a Myoview in 2010, that showed no significant ischemia.  LABS:  Chest x-ray at discharge shows pacemaker well positioned, no complications.  Telemetry shows atrial pacing.  DISPOSITION:  The patient is discharged in stable condition and will follow up with [name] as an outpatient for site check in a week.     Abelino Derrick, P.A.   ______________________________ Thurmon Fair, MD    LKK/MEDQ  D:  06/30/2011  T:  06/30/2011  Job:  [id number]  Electronically Signed by Corine Shelter P.A. on 07/10/2011 09:34:18 AM Electronically  Signed by Thurmon Fair M.D. on 07/12/2011 04:56:26 PM

## 2011-09-25 ENCOUNTER — Other Ambulatory Visit (HOSPITAL_COMMUNITY): Payer: Self-pay | Admitting: Family Medicine

## 2011-09-25 DIAGNOSIS — Z139 Encounter for screening, unspecified: Secondary | ICD-10-CM

## 2011-09-29 ENCOUNTER — Other Ambulatory Visit (HOSPITAL_COMMUNITY): Payer: PRIVATE HEALTH INSURANCE

## 2011-11-15 ENCOUNTER — Other Ambulatory Visit (HOSPITAL_COMMUNITY): Payer: PRIVATE HEALTH INSURANCE

## 2012-01-11 ENCOUNTER — Ambulatory Visit (HOSPITAL_COMMUNITY)
Admission: RE | Admit: 2012-01-11 | Discharge: 2012-01-11 | Disposition: A | Discharge status: Home / Self Care | Payer: No Typology Code available for payment source | Source: Ambulatory Visit | Attending: Family Medicine | Admitting: Family Medicine

## 2012-01-11 DIAGNOSIS — M810 Age-related osteoporosis without current pathological fracture: Secondary | ICD-10-CM | POA: Insufficient documentation

## 2012-01-11 DIAGNOSIS — Z13820 Encounter for screening for osteoporosis: Secondary | ICD-10-CM | POA: Insufficient documentation

## 2012-01-11 DIAGNOSIS — Z139 Encounter for screening, unspecified: Secondary | ICD-10-CM

## 2012-01-11 DIAGNOSIS — Z780 Asymptomatic menopausal state: Secondary | ICD-10-CM | POA: Insufficient documentation

## 2012-01-11 DIAGNOSIS — Z9079 Acquired absence of other genital organ(s): Secondary | ICD-10-CM | POA: Insufficient documentation

## 2013-01-19 ENCOUNTER — Ambulatory Visit: Payer: Self-pay | Admitting: Cardiovascular Disease

## 2013-01-19 DIAGNOSIS — I4819 Other persistent atrial fibrillation: Secondary | ICD-10-CM | POA: Insufficient documentation

## 2013-01-19 DIAGNOSIS — Z7901 Long term (current) use of anticoagulants: Secondary | ICD-10-CM

## 2013-01-19 DIAGNOSIS — Z79899 Other long term (current) drug therapy: Secondary | ICD-10-CM | POA: Insufficient documentation

## 2013-01-19 DIAGNOSIS — I4891 Unspecified atrial fibrillation: Secondary | ICD-10-CM

## 2013-03-03 ENCOUNTER — Encounter: Payer: Self-pay | Admitting: *Deleted

## 2013-04-15 ENCOUNTER — Ambulatory Visit (INDEPENDENT_AMBULATORY_CARE_PROVIDER_SITE_OTHER): Payer: No Typology Code available for payment source | Admitting: Cardiovascular Disease

## 2013-04-15 ENCOUNTER — Ambulatory Visit (INDEPENDENT_AMBULATORY_CARE_PROVIDER_SITE_OTHER): Payer: 59 | Admitting: Pharmacist Clinician (PhC)/ Clinical Pharmacy Specialist

## 2013-04-15 ENCOUNTER — Other Ambulatory Visit: Payer: Self-pay | Admitting: Cardiovascular Disease

## 2013-04-15 ENCOUNTER — Encounter: Payer: Self-pay | Admitting: Cardiovascular Disease

## 2013-04-15 VITALS — BP 110/60 | HR 66 | Resp 16 | Ht 64.0 in | Wt 130.7 lb

## 2013-04-15 DIAGNOSIS — I4891 Unspecified atrial fibrillation: Secondary | ICD-10-CM

## 2013-04-15 DIAGNOSIS — Z950 Presence of cardiac pacemaker: Secondary | ICD-10-CM

## 2013-04-15 DIAGNOSIS — Z7901 Long term (current) use of anticoagulants: Secondary | ICD-10-CM

## 2013-04-15 DIAGNOSIS — I480 Paroxysmal atrial fibrillation: Secondary | ICD-10-CM

## 2013-04-15 DIAGNOSIS — E785 Hyperlipidemia, unspecified: Secondary | ICD-10-CM

## 2013-04-15 DIAGNOSIS — I495 Sick sinus syndrome: Secondary | ICD-10-CM

## 2013-04-15 DIAGNOSIS — I10 Essential (primary) hypertension: Secondary | ICD-10-CM | POA: Insufficient documentation

## 2013-04-15 LAB — PACEMAKER DEVICE OBSERVATION

## 2013-04-15 LAB — POCT INR: INR: 1.4

## 2013-04-15 NOTE — Patient Instructions (Signed)
Your physician recommends that you schedule a follow-up appointment in: June 2015, and a pacemaker check in September 2014.

## 2013-04-15 NOTE — Progress Notes (Signed)
In clinic pacemaker check. Normal device function. No changes made this session. 

## 2013-04-16 ENCOUNTER — Ambulatory Visit: Payer: No Typology Code available for payment source

## 2013-04-17 LAB — PACEMAKER DEVICE OBSERVATION
AL IMPEDENCE PM: 548 Ohm
BATTERY VOLTAGE: 2.79 V
RV LEAD AMPLITUDE: 11.2 mv
RV LEAD IMPEDENCE PM: 567 Ohm

## 2013-04-20 ENCOUNTER — Encounter: Payer: Self-pay | Admitting: Cardiovascular Disease

## 2013-04-20 NOTE — Assessment & Plan Note (Signed)
Medtronic Adapta ADDRL1, serial number [id number], implanted on October 16th 2012. Normal device function by in office check today. Please see the separate office pacemaker check noted. Estimated battery longevity approximately 13 years. 22 episodes of mode switch recorded were all very brief under 60 seconds in duration, all consistent with atrial tachycardia. There is 87% atrial pacing in 0.1% ventricular pacing threshold directly tested today but there were no changes made to the device settings.

## 2013-04-20 NOTE — Assessment & Plan Note (Signed)
Not sure when last lipid profile was performed, will check with Dr. Phillips Odor

## 2013-04-20 NOTE — Progress Notes (Signed)
Patient ID: Jaquelyn Bitter, female   DOB: 11/06/1936, 77 y.o.   MRN: [id number] .    Reason for office visit Mrs. Krouse returns in followup for hyperlipidemia, hypertension, paroxysmal atrial fibrillation and a pacemaker check (device implant for tachycardia-bradycardia syndrome in 2012)  Been no major changes in her health since her last appointment. Chest not had any episodes of syncope, changes in exercise tolerance or complaints of chest pain.    No Known Allergies  Current Outpatient Prescriptions  Medication Sig Dispense Refill  . alendronate (FOSAMAX) 35 MG tablet Take 35 mg by mouth every 7 (seven) days. Take with a full glass of water on an empty stomach.      Marland Kitchen atenolol (TENORMIN) 25 MG tablet Take 25 mg by mouth daily.      . cholecalciferol (VITAMIN D) 400 UNITS TABS Take by mouth.      . fish oil-omega-3 fatty acids 1000 MG capsule Take 1 g by mouth daily.      Marland Kitchen lisinopril (PRINIVIL,ZESTRIL) 20 MG tablet Take 20 mg by mouth daily.      . metFORMIN (GLUCOPHAGE) 500 MG tablet Take 500 mg by mouth 2 (two) times daily with a meal. Takes 1/2 tablet daily      . omeprazole (PRILOSEC) 20 MG capsule Take 20 mg by mouth daily.      . simvastatin (ZOCOR) 20 MG tablet Take 20 mg by mouth every evening.      . warfarin (COUMADIN) 5 MG tablet Take 5 mg by mouth daily. Takes 1/2 tablet daily except for 5mg  every Tuesday and Thursday       No current facility-administered medications for this visit.    Past Medical History  Diagnosis Date  . Atrial flutter     successful radiofrequency ablation 2007  . Paroxysmal atrial fibrillation   . Tachycardia-bradycardia syndrome     permament pacermaker, dual chamber Medtronic on 06/2011  . Hypertension   . Hyperlipidemia   . Pacemaker     Past Surgical History  Procedure Laterality Date  . Permament pacemaker  06/2011    dual chamber , Medtronic Adapta serial # [id number]  last checked 02/05/2013  . Hiatal hernia repair  2005  .  Radiofrequency ablation  06/28/2006    No family history on file.  History   Social History  . Marital Status: Widowed    Spouse Name: [name]    Number of Children: 4  . Years of Education: [name]   Occupational History  . Not on file.   Social History Main Topics  . Smoking status: Never Smoker   . Smokeless tobacco: Not on file  . Alcohol Use: No  . Drug Use: No  . Sexually Active: Not on file   Other Topics Concern  . Not on file   Social History Narrative  . No narrative on file    Review of systems: The patient specifically denies any chest pain at rest exertion, dyspnea at rest or with exertion, orthopnea, paroxysmal nocturnal dyspnea, syncope, palpitations, focal neurological deficits, intermittent claudication, lower extremity edema, unexplained weight gain, cough, hemoptysis or wheezing.   PHYSICAL EXAM BP 110/60  Pulse 66  Resp 16  Ht 5\' 4"  (1.626 m)  Wt 59.285 kg (130 lb 11.2 oz)  BMI 22.42 kg/m2  General: Alert, oriented x3, no distress Head: no evidence of trauma, PERRL, EOMI, no exophtalmos or lid lag, no myxedema, no xanthelasma; normal ears, nose and oropharynx Neck: normal jugular venous pulsations and no hepatojugular  reflux; brisk carotid pulses without delay and no carotid bruits Chest: clear to auscultation, no signs of consolidation by percussion or palpation, normal fremitus, symmetrical and full respiratory excursions; left subclavian pacemaker site appears to be healthy Cardiovascular: normal position and quality of the apical impulse, regular rhythm, normal first and second heart sounds, no murmurs, rubs or gallops Abdomen: no tenderness or distention, no masses by palpation, no abnormal pulsatility or arterial bruits, normal bowel sounds, no hepatosplenomegaly Extremities: no clubbing, cyanosis or edema; 2+ radial, ulnar and brachial pulses bilaterally; 2+ right femoral, posterior tibial and dorsalis pedis pulses; 2+ left femoral, posterior tibial  and dorsalis pedis pulses; no subclavian or femoral bruits Neurological: grossly nonfocal   EKG: Atrial paced ventricular sensed rhythm; chronic right bundle branch block no changes to suggest acute coronary insufficiency   ASSESSMENT AND PLAN Atrial fibrillation Overall prevalence is low. On warfarin anticoagulation without bleeding complications. Antiarrhythmics are not indicated. Note remote history of successful atrial flutter ablation. Little in the way of structural heart disease. Normal myocardial perfusion study in 2002, has never had coronary angiography. Normal left extrasystolic function and questionable evidence of diastolic dysfunction by echocardiography in 2012. Normal left atrial size.  Hypertension Good control on current meds.  Hyperlipidemia Not sure when last lipid profile was performed, will check with Dr. Phillips Odor  Pacemaker Medtronic Adapta ADDRL1, serial number [id number], implanted on October 16th 2012. Normal device function by in office check today. Please see the separate office pacemaker check noted. Estimated battery longevity approximately 13 years. 22 episodes of mode switch recorded were all very brief under 60 seconds in duration, all consistent with atrial tachycardia. There is 87% atrial pacing in 0.1% ventricular pacing threshold directly tested today but there were no changes made to the device settings.    Junious Silk, MD, [facility] [phone] office [phone] pager

## 2013-04-20 NOTE — Assessment & Plan Note (Signed)
Good control on current meds. 

## 2013-04-20 NOTE — Assessment & Plan Note (Addendum)
Overall prevalence is low. On warfarin anticoagulation without bleeding complications. Antiarrhythmics are not indicated. Note remote history of successful atrial flutter ablation. Little in the way of structural heart disease. Normal myocardial perfusion study in 2002, has never had coronary angiography. Normal left extrasystolic function and questionable evidence of diastolic dysfunction by echocardiography in 2012. Normal left atrial size.

## 2013-04-23 ENCOUNTER — Ambulatory Visit: Payer: No Typology Code available for payment source | Admitting: Cardiovascular Disease

## 2013-04-29 ENCOUNTER — Ambulatory Visit (INDEPENDENT_AMBULATORY_CARE_PROVIDER_SITE_OTHER): Payer: 59 | Admitting: Pharmacist Clinician (PhC)/ Clinical Pharmacy Specialist

## 2013-04-29 VITALS — BP 144/72 | HR 84

## 2013-04-29 DIAGNOSIS — Z7901 Long term (current) use of anticoagulants: Secondary | ICD-10-CM

## 2013-04-29 DIAGNOSIS — I4891 Unspecified atrial fibrillation: Secondary | ICD-10-CM

## 2013-04-29 LAB — POCT INR: INR: 2.2

## 2013-05-21 ENCOUNTER — Encounter: Payer: Self-pay | Admitting: Cardiovascular Disease

## 2013-05-27 ENCOUNTER — Ambulatory Visit (INDEPENDENT_AMBULATORY_CARE_PROVIDER_SITE_OTHER): Payer: 59 | Admitting: Pharmacist Clinician (PhC)/ Clinical Pharmacy Specialist

## 2013-05-27 VITALS — BP 128/70 | HR 80

## 2013-05-27 DIAGNOSIS — I4891 Unspecified atrial fibrillation: Secondary | ICD-10-CM

## 2013-05-27 DIAGNOSIS — Z7901 Long term (current) use of anticoagulants: Secondary | ICD-10-CM

## 2013-05-27 LAB — POCT INR: INR: 3

## 2013-06-22 ENCOUNTER — Other Ambulatory Visit: Payer: Self-pay | Admitting: Cardiovascular Disease

## 2013-06-24 ENCOUNTER — Ambulatory Visit (INDEPENDENT_AMBULATORY_CARE_PROVIDER_SITE_OTHER): Payer: Medicare Other | Admitting: Pharmacist Clinician (PhC)/ Clinical Pharmacy Specialist

## 2013-06-24 VITALS — BP 144/80 | HR 84

## 2013-06-24 DIAGNOSIS — I4891 Unspecified atrial fibrillation: Secondary | ICD-10-CM

## 2013-06-24 DIAGNOSIS — Z7901 Long term (current) use of anticoagulants: Secondary | ICD-10-CM

## 2013-06-24 LAB — POCT INR: INR: 2.9

## 2013-06-26 NOTE — Telephone Encounter (Addendum)
Called patient to inquire about Atenolol dosage, LMTCB.

## 2013-06-26 NOTE — Telephone Encounter (Signed)
Called patient again to inquire dosage. Patient has been on Atenolol 25 mg since 07/2011. Per pt, Dr. [name] refilled her rx for Atenolol 50 mg. Informed patient that I would talk to Dr. Salena Saner about dosage and call her back.  Per Dr. Salena Saner, she should still be on Atenolol 25 mg. Pt will cut the 50 mg tablets she already has in half.

## 2013-07-22 ENCOUNTER — Ambulatory Visit (INDEPENDENT_AMBULATORY_CARE_PROVIDER_SITE_OTHER): Payer: Medicare Other | Admitting: Pharmacist Clinician (PhC)/ Clinical Pharmacy Specialist

## 2013-07-22 ENCOUNTER — Other Ambulatory Visit: Payer: Self-pay | Admitting: Cardiovascular Disease

## 2013-07-22 ENCOUNTER — Encounter: Payer: Self-pay | Admitting: Cardiovascular Disease

## 2013-07-22 ENCOUNTER — Ambulatory Visit (INDEPENDENT_AMBULATORY_CARE_PROVIDER_SITE_OTHER): Payer: Medicare Other | Admitting: Cardiovascular Disease

## 2013-07-22 VITALS — BP 130/80 | HR 68 | Resp 16 | Ht 64.0 in | Wt 134.6 lb

## 2013-07-22 DIAGNOSIS — I480 Paroxysmal atrial fibrillation: Secondary | ICD-10-CM

## 2013-07-22 DIAGNOSIS — I4891 Unspecified atrial fibrillation: Secondary | ICD-10-CM

## 2013-07-22 DIAGNOSIS — Z950 Presence of cardiac pacemaker: Secondary | ICD-10-CM

## 2013-07-22 DIAGNOSIS — R0609 Other forms of dyspnea: Secondary | ICD-10-CM

## 2013-07-22 DIAGNOSIS — R0600 Dyspnea, unspecified: Secondary | ICD-10-CM

## 2013-07-22 DIAGNOSIS — I10 Essential (primary) hypertension: Secondary | ICD-10-CM

## 2013-07-22 DIAGNOSIS — I495 Sick sinus syndrome: Secondary | ICD-10-CM

## 2013-07-22 DIAGNOSIS — E785 Hyperlipidemia, unspecified: Secondary | ICD-10-CM

## 2013-07-22 DIAGNOSIS — Z7901 Long term (current) use of anticoagulants: Secondary | ICD-10-CM

## 2013-07-22 LAB — PACEMAKER DEVICE OBSERVATION
AL AMPLITUDE: 4 mv
AL IMPEDENCE PM: 539 Ohm
ATRIAL PACING PM: 87.2
RV LEAD IMPEDENCE PM: 544 Ohm
VENTRICULAR PACING PM: 0.1

## 2013-07-22 NOTE — Progress Notes (Signed)
In office pacemaker interrogation. Normal device function. No changes made this session. 

## 2013-07-22 NOTE — Patient Instructions (Addendum)
Your physician has requested that you have an echocardiogram. Echocardiography is a painless test that uses sound waves to create images of your heart. It provides your doctor with information about the size and shape of your heart and how well your heart's chambers and valves are working. This procedure takes approximately one hour. There are no restrictions for this procedure.   Your physician recommends that you schedule a follow-up appointment in: 3 months for a pacemaker check, and in 12 months with [facility]

## 2013-07-27 ENCOUNTER — Encounter: Payer: Self-pay | Admitting: Cardiovascular Disease

## 2013-07-27 NOTE — Progress Notes (Signed)
Patient ID: Anne Anthony, female   DOB: June 04, 1936, 77 y.o.   MRN: [id number]     Reason for office visit Atrial fibrillation, dyspnea, pacemaker  Anne Anthony returns for followup for atrial tachycardia arrhythmias and pacemaker therapy. She was initially diagnosed with atrial flutter and had successful radiofrequency ablation for several years later returned with paroxysmal atrial fibrillation and required a dual-chamber permanent pacemaker for tachycardia-bradycardia syndrome (Medtronic).  She returns for followup and complains of dyspnea over the last 3 months. This occurs with exertion and appears to fit into an NYHA functional class II status. She also has episodes of dyspnea that appear to occur around me. She complains of occasional vertigo. She denies angina or syncope.  Previous workup includes an echocardiogram in 2012 that showed equivocal findings. There was normal left ventricular wall thickness but there was a suggestion of abnormal relaxation and borderline increase in left ventricular filling pressures. The left atrium was described as small. The right atrium was normal in size. There were no significant valvular abnormalities. She has a systolic murmur that appears to be secondary to aortic valve sclerosis. She is well treated systemic hypertension and is a very healthy weight. She has very mild diabetes on metformin monotherapy and has treated hyperlipidemia. A nuclear stress test was performed in 2002 and was normal. She has never undergone coronary angiography.  A full pacemaker check was performed in the clinic at the time of her visit. Overall findings are normal. There is roughly 87% atrial pacing which is comparable to his historical trends. There is virtually no ventricular pacing. She had occasional mode switches secondary to atrial tachyarrhythmia but the longest one lasted for only 5 seconds. There was one episode of nonsustained ventricular tachycardia or as all the  others appear to be atrial tachycardia. The episodes are much too brief to be expected to cause symptoms and did not appear to have correlation with her complaint   No Known Allergies  Current Outpatient Prescriptions  Medication Sig Dispense Refill  . alendronate (FOSAMAX) 35 MG tablet Take 35 mg by mouth every 7 (seven) days. Take with a full glass of water on an empty stomach.      Marland Kitchen atenolol (TENORMIN) 25 MG tablet Take 25 mg by mouth daily.      . cholecalciferol (VITAMIN D) 400 UNITS TABS Take by mouth.      . diazepam (VALIUM) 2 MG tablet Take 2 mg by mouth as needed.      . fish oil-omega-3 fatty acids 1000 MG capsule Take 1 g by mouth daily.      Marland Kitchen lisinopril (PRINIVIL,ZESTRIL) 20 MG tablet Take 20 mg by mouth daily.      Marland Kitchen omeprazole (PRILOSEC) 20 MG capsule Take 20 mg by mouth daily.      . simvastatin (ZOCOR) 20 MG tablet Take 20 mg by mouth every evening.      . warfarin (COUMADIN) 5 MG tablet Take 5 mg by mouth daily. Takes 1/2 tablet daily except for 5mg  every Tuesday and Thursday       No current facility-administered medications for this visit.    Past Medical History  Diagnosis Date  . Atrial flutter     successful radiofrequency ablation 2007  . Paroxysmal atrial fibrillation   . Tachycardia-bradycardia syndrome     permament pacermaker, dual chamber Medtronic on 06/2011  . Hypertension   . Hyperlipidemia   . Pacemaker     Past Surgical History  Procedure Laterality Date  .  Permament pacemaker  06/2011    dual chamber , Medtronic Adapta serial # [id number]  last checked 02/05/2013  . Hiatal hernia repair  2005  . Radiofrequency ablation  06/28/2006    No family history on file.  History   Social History  . Marital Status: Widowed    Spouse Name: [name]    Number of Children: 4  . Years of Education: [name]   Occupational History  . Not on file.   Social History Main Topics  . Smoking status: Never Smoker   . Smokeless tobacco: Not on file  . Alcohol  Use: No  . Drug Use: No  . Sexual Activity: Not on file   Other Topics Concern  . Not on file   Social History Narrative  . No narrative on file    Review of systems: The patient specifically denies any chest pain at rest or with exertion, dyspnea at rest or with exertion, orthopnea, paroxysmal nocturnal dyspnea, syncope, palpitations, focal neurological deficits, intermittent claudication, lower extremity edema, unexplained weight gain, cough, hemoptysis or wheezing.  The patient also denies abdominal pain, nausea, vomiting, dysphagia, diarrhea, constipation, polyuria, polydipsia, dysuria, hematuria, frequency, urgency, abnormal bleeding or bruising, fever, chills, unexpected weight changes, mood swings, change in skin or hair texture, change in voice quality, auditory or visual problems, allergic reactions or rashes, new musculoskeletal complaints other than usual "aches and pains".   PHYSICAL EXAM BP 130/80  Pulse 68  Resp 16  Ht 5\' 4"  (1.626 m)  Wt 134 lb 9.6 oz (61.054 kg)  BMI 23.09 kg/m2  General: Alert, oriented x3, no distress  Head: no evidence of trauma, PERRL, EOMI, no exophtalmos or lid lag, no myxedema, no xanthelasma; normal ears, nose and oropharynx  Neck: normal jugular venous pulsations and no hepatojugular reflux; brisk carotid pulses without delay and no carotid bruits  Chest: clear to auscultation, no signs of consolidation by percussion or palpation, normal fremitus, symmetrical and full respiratory excursions; left subclavian pacemaker site appears to be healthy  Cardiovascular: normal position and quality of the apical impulse, regular rhythm, normal first and second heart sounds, no murmurs, rubs or gallops  Abdomen: no tenderness or distention, no masses by palpation, no abnormal pulsatility or arterial bruits, normal bowel sounds, no hepatosplenomegaly  Extremities: no clubbing, cyanosis or edema; 2+ radial, ulnar and brachial pulses bilaterally; 2+ right  femoral, posterior tibial and dorsalis pedis pulses; 2+ left femoral, posterior tibial and dorsalis pedis pulses; no subclavian or femoral bruits  Neurological: grossly nonfocal  EKG: Atrial paced ventricular sensed rhythm with incomplete right bundle branch block    ASSESSMENT AND PLAN It is uncertain whether her dyspnea has cardiac etiology but the borderline findings on her previous echocardiogram are intriguing. I have recommended that we repeat the echocardiogram. By clinical exam I do not detect findings to suggest structural heart disease. She has normal left ventricular systolic function. She has well treated hypertension. If the echocardiogram does not explain her complaints consider and evaluation for coronary insufficiency. Dyspnea could be an angina equivalent.  Heart rate histogram distribution is fair. Her complaints do not appear to be related to chronotropic incompetence.  She has not had recent episodes of atrial fibrillation but atrial tachycardia a\is frequently recorded. The most recent clearcut episode of paroxysmal atrial fibrillation that I can find occurred roughly 2 years ago in August of 2012.  The most recent chest x-ray report that I can identify is also from August of 2012 and describes a "very large  hiatal hernia. One wonders whether this could have anything to do with her dyspnea.     Orders Placed This Encounter  Procedures  . 2D Echocardiogram with contrast   Meds ordered this encounter  Medications  . diazepam (VALIUM) 2 MG tablet    Sig: Take 2 mg by mouth as needed.    Junious Silk, MD, [facility] [phone] office [phone] pager

## 2013-08-05 ENCOUNTER — Ambulatory Visit (HOSPITAL_COMMUNITY)
Admission: RE | Admit: 2013-08-05 | Discharge: 2013-08-05 | Disposition: A | Discharge status: Home / Self Care | Payer: Medicare Other | Source: Ambulatory Visit | Attending: Cardiology | Admitting: Cardiology

## 2013-08-05 ENCOUNTER — Ambulatory Visit (INDEPENDENT_AMBULATORY_CARE_PROVIDER_SITE_OTHER): Payer: Medicare Other | Admitting: Pharmacist Clinician (PhC)/ Clinical Pharmacy Specialist

## 2013-08-05 VITALS — BP 122/70 | HR 72

## 2013-08-05 DIAGNOSIS — R0600 Dyspnea, unspecified: Secondary | ICD-10-CM

## 2013-08-05 DIAGNOSIS — I4891 Unspecified atrial fibrillation: Secondary | ICD-10-CM

## 2013-08-05 DIAGNOSIS — Z7901 Long term (current) use of anticoagulants: Secondary | ICD-10-CM

## 2013-08-05 DIAGNOSIS — R0602 Shortness of breath: Secondary | ICD-10-CM

## 2013-08-11 ENCOUNTER — Telehealth: Payer: Self-pay | Admitting: Pharmacist Clinician (PhC)/ Clinical Pharmacy Specialist

## 2013-09-02 ENCOUNTER — Ambulatory Visit (INDEPENDENT_AMBULATORY_CARE_PROVIDER_SITE_OTHER): Payer: Medicare Other | Admitting: Pharmacist Clinician (PhC)/ Clinical Pharmacy Specialist

## 2013-09-02 VITALS — BP 130/70

## 2013-09-02 DIAGNOSIS — I4891 Unspecified atrial fibrillation: Secondary | ICD-10-CM

## 2013-09-02 DIAGNOSIS — Z7901 Long term (current) use of anticoagulants: Secondary | ICD-10-CM

## 2013-09-02 LAB — POCT INR
INR: 2
INR: 2

## 2013-09-30 ENCOUNTER — Ambulatory Visit (INDEPENDENT_AMBULATORY_CARE_PROVIDER_SITE_OTHER): Payer: Medicare Other | Admitting: Pharmacist Clinician (PhC)/ Clinical Pharmacy Specialist

## 2013-09-30 VITALS — BP 124/70 | HR 76

## 2013-09-30 DIAGNOSIS — Z7901 Long term (current) use of anticoagulants: Secondary | ICD-10-CM

## 2013-09-30 DIAGNOSIS — I4891 Unspecified atrial fibrillation: Secondary | ICD-10-CM

## 2013-10-28 ENCOUNTER — Ambulatory Visit (INDEPENDENT_AMBULATORY_CARE_PROVIDER_SITE_OTHER): Payer: Medicare Other | Admitting: Pharmacist Clinician (PhC)/ Clinical Pharmacy Specialist

## 2013-10-28 VITALS — BP 124/70 | HR 76

## 2013-10-28 DIAGNOSIS — I4891 Unspecified atrial fibrillation: Secondary | ICD-10-CM

## 2013-10-28 DIAGNOSIS — Z7901 Long term (current) use of anticoagulants: Secondary | ICD-10-CM

## 2013-11-18 ENCOUNTER — Ambulatory Visit (INDEPENDENT_AMBULATORY_CARE_PROVIDER_SITE_OTHER): Payer: Medicare Other | Admitting: Pharmacist Clinician (PhC)/ Clinical Pharmacy Specialist

## 2013-11-18 VITALS — BP 142/66 | HR 76

## 2013-11-18 DIAGNOSIS — Z7901 Long term (current) use of anticoagulants: Secondary | ICD-10-CM

## 2013-11-18 DIAGNOSIS — I4891 Unspecified atrial fibrillation: Secondary | ICD-10-CM

## 2013-11-18 LAB — POCT INR: INR: 4.2

## 2013-11-20 ENCOUNTER — Encounter: Payer: Self-pay | Admitting: *Deleted

## 2013-11-28 ENCOUNTER — Telehealth: Payer: Self-pay | Admitting: *Deleted

## 2013-11-28 NOTE — Telephone Encounter (Signed)
Pt called stating that she received a letter to have her pacer checked. I don't see where she would need it bc I did not see a recall.   [place]

## 2013-11-28 NOTE — Telephone Encounter (Signed)
Appointment scheduled.

## 2013-12-02 ENCOUNTER — Ambulatory Visit (INDEPENDENT_AMBULATORY_CARE_PROVIDER_SITE_OTHER): Payer: Medicare Other | Admitting: Pharmacist Clinician (PhC)/ Clinical Pharmacy Specialist

## 2013-12-02 VITALS — BP 134/66 | HR 84

## 2013-12-02 DIAGNOSIS — I4891 Unspecified atrial fibrillation: Secondary | ICD-10-CM

## 2013-12-02 DIAGNOSIS — Z7901 Long term (current) use of anticoagulants: Secondary | ICD-10-CM

## 2013-12-02 LAB — POCT INR: INR: 2.1

## 2013-12-18 ENCOUNTER — Ambulatory Visit (INDEPENDENT_AMBULATORY_CARE_PROVIDER_SITE_OTHER): Payer: Medicare Other | Admitting: *Deleted

## 2013-12-18 DIAGNOSIS — I4891 Unspecified atrial fibrillation: Secondary | ICD-10-CM

## 2013-12-18 DIAGNOSIS — I495 Sick sinus syndrome: Secondary | ICD-10-CM

## 2013-12-18 NOTE — Patient Instructions (Addendum)
Your physician recommends that you schedule a follow-up appointment in: 3 months with the device clinic  

## 2013-12-19 LAB — MDC_IDC_ENUM_SESS_TYPE_INCLINIC
Battery Remaining Longevity: 13
Battery Voltage: 2.79 V
Brady Statistic AP VS Percent: 83.7 %
Lead Channel Pacing Threshold Amplitude: 0.5 V
Lead Channel Pacing Threshold Pulse Width: 0.4 ms
Lead Channel Sensing Intrinsic Amplitude: 11.2 mV
Lead Channel Setting Pacing Amplitude: 1.5 V
Lead Channel Setting Pacing Pulse Width: 0.4 ms
Lead Channel Setting Sensing Sensitivity: 4 mV
MDC IDC MSMT BATTERY IMPEDANCE: 136 Ohm
MDC IDC MSMT LEADCHNL RA IMPEDANCE VALUE: 574 Ohm
MDC IDC MSMT LEADCHNL RA PACING THRESHOLD AMPLITUDE: 0.5 V
MDC IDC MSMT LEADCHNL RA PACING THRESHOLD PULSEWIDTH: 0.4 ms
MDC IDC MSMT LEADCHNL RA SENSING INTR AMPL: 4 mV
MDC IDC MSMT LEADCHNL RV IMPEDANCE VALUE: 567 Ohm
MDC IDC SET LEADCHNL RV PACING AMPLITUDE: 2 V
MDC IDC STAT BRADY AP VP PERCENT: 0.1 %
MDC IDC STAT BRADY AS VP PERCENT: 0.1 % — AB
MDC IDC STAT BRADY AS VS PERCENT: 16.2 %

## 2013-12-19 NOTE — Progress Notes (Signed)
Pacemaker check in clinic. Normal device function. Thresholds, sensing, impedances consistent with previous measurements. Device programmed to maximize longevity. 48 mode switches (all <30 sec, no EGMs). 3 high ventricular rates noted---max dur. "5 sec", Max V 183---AT/NSVT (x8 bts noted). Device programmed at appropriate safety margins. Histogram distribution appropriate for patient activity level. Device programmed to optimize intrinsic conduction. Estimated longevity 13 years. Patient will follow up with the device clinic in 3 months.

## 2013-12-29 ENCOUNTER — Ambulatory Visit: Payer: Medicare Other | Admitting: Pharmacist Clinician (PhC)/ Clinical Pharmacy Specialist

## 2014-01-05 ENCOUNTER — Ambulatory Visit (INDEPENDENT_AMBULATORY_CARE_PROVIDER_SITE_OTHER): Payer: Medicare Other | Admitting: Pharmacist Clinician (PhC)/ Clinical Pharmacy Specialist

## 2014-01-05 VITALS — BP 110/60 | HR 72

## 2014-01-05 DIAGNOSIS — Z7901 Long term (current) use of anticoagulants: Secondary | ICD-10-CM

## 2014-01-05 DIAGNOSIS — I4891 Unspecified atrial fibrillation: Secondary | ICD-10-CM

## 2014-01-05 LAB — POCT INR: INR: 2.7

## 2014-02-02 ENCOUNTER — Ambulatory Visit (INDEPENDENT_AMBULATORY_CARE_PROVIDER_SITE_OTHER): Payer: Medicare Other | Admitting: Pharmacist Clinician (PhC)/ Clinical Pharmacy Specialist

## 2014-02-02 DIAGNOSIS — Z7901 Long term (current) use of anticoagulants: Secondary | ICD-10-CM

## 2014-02-02 DIAGNOSIS — I4891 Unspecified atrial fibrillation: Secondary | ICD-10-CM

## 2014-02-02 LAB — POCT INR: INR: 2.5

## 2014-02-18 ENCOUNTER — Emergency Department (HOSPITAL_COMMUNITY): Payer: Medicare Other

## 2014-02-18 ENCOUNTER — Encounter (HOSPITAL_COMMUNITY): Payer: Self-pay | Admitting: Emergency Medicine

## 2014-02-18 ENCOUNTER — Emergency Department (HOSPITAL_COMMUNITY)
Admission: EM | Admit: 2014-02-18 | Discharge: 2014-02-18 | Disposition: A | Discharge status: Home / Self Care | Payer: Medicare Other | Attending: Emergency Medicine | Admitting: Emergency Medicine

## 2014-02-18 DIAGNOSIS — M5430 Sciatica, unspecified side: Secondary | ICD-10-CM

## 2014-02-18 DIAGNOSIS — Z8739 Personal history of other diseases of the musculoskeletal system and connective tissue: Secondary | ICD-10-CM | POA: Insufficient documentation

## 2014-02-18 DIAGNOSIS — I4891 Unspecified atrial fibrillation: Secondary | ICD-10-CM | POA: Insufficient documentation

## 2014-02-18 DIAGNOSIS — Z950 Presence of cardiac pacemaker: Secondary | ICD-10-CM | POA: Insufficient documentation

## 2014-02-18 DIAGNOSIS — E785 Hyperlipidemia, unspecified: Secondary | ICD-10-CM | POA: Insufficient documentation

## 2014-02-18 DIAGNOSIS — Z79899 Other long term (current) drug therapy: Secondary | ICD-10-CM | POA: Insufficient documentation

## 2014-02-18 DIAGNOSIS — Z7901 Long term (current) use of anticoagulants: Secondary | ICD-10-CM | POA: Insufficient documentation

## 2014-02-18 DIAGNOSIS — I10 Essential (primary) hypertension: Secondary | ICD-10-CM | POA: Insufficient documentation

## 2014-02-18 MED ORDER — TRAMADOL HCL 50 MG PO TABS: 50.0000 mg | ORAL_TABLET | Freq: Four times a day (QID) | ORAL | Status: DC | PRN

## 2014-02-18 MED ORDER — PREDNISONE 10 MG PO TABS: 20.0000 mg | ORAL_TABLET | Freq: Two times a day (BID) | ORAL | Status: DC

## 2014-02-18 NOTE — ED Provider Notes (Signed)
CSN: [id number]     Arrival date & time 02/18/14  1106 History  This chart was scribed for Veryl Speak, MD by Zettie Pho, ED Scribe. This patient was seen in room APA07/APA07 and the patient's care was started at 12:03 PM.    Chief Complaint  Patient presents with  . Leg Pain   The history is provided by the patient. No language interpreter was used.   HPI Comments: Anne Anthony is a 78 y.o. female with a history of arthritis who presents to the Emergency Department complaining of an intermittent pain to the left, upper leg that she states has been ongoing for the past 2-3 weeks, but has been progressively worsening. Patient states that the pain is exacerbated with sitting up, but alleviated with rest. She denies any recent falls or potential injury or trauma to the area. She denies back pain, bowel or bladder incontinence, erythema, or swelling. Patient has a history of atrial flutter, paroxysmal atrial fibrillation, tachycardia-bradycardia syndrome, HTN, hyperlipidemia, and a pacemaker in-place.   Past Medical History  Diagnosis Date  . Atrial flutter     successful radiofrequency ablation 2007  . Paroxysmal atrial fibrillation   . Tachycardia-bradycardia syndrome     permament pacermaker, dual chamber Medtronic on 06/2011  . Hypertension   . Hyperlipidemia   . Pacemaker    Past Surgical History  Procedure Laterality Date  . Permament pacemaker  06/2011    dual chamber , Medtronic Adapta serial # [id number]  last checked 02/05/2013  . Hiatal hernia repair  2005  . Radiofrequency ablation  06/28/2006   No family history on file. History  Substance Use Topics  . Smoking status: Never Smoker   . Smokeless tobacco: Not on file  . Alcohol Use: No   OB History   Grav Para Term Preterm Abortions TAB SAB Ect Mult Living                 Review of Systems  A complete 10 system review of systems was obtained and all systems are negative except as noted in the HPI and PMH.     Allergies  Review of patient's allergies indicates no known allergies.  Home Medications   Current Outpatient Rx  Name  Route  Sig  Dispense  Refill  . atenolol (TENORMIN) 25 MG tablet   Oral   Take 25 mg by mouth daily.         Marland Kitchen Bioflavonoid Products (ESTER C PO)   Oral   Take by mouth daily.         . Calcium Carb-Cholecalciferol (CALCIUM 600 + D PO)   Oral   Take by mouth daily.         Marland Kitchen CHERATUSSIN AC 100-10 MG/5ML syrup   Oral   Take 5 mLs by mouth as needed.         . fish oil-omega-3 fatty acids 1000 MG capsule   Oral   Take 1 g by mouth daily.         . fluconazole (DIFLUCAN) 150 MG tablet   Oral   Take 1 tablet by mouth as needed.         Marland Kitchen lisinopril (PRINIVIL,ZESTRIL) 20 MG tablet   Oral   Take 20 mg by mouth daily.         Marland Kitchen omeprazole (PRILOSEC) 20 MG capsule   Oral   Take 20 mg by mouth daily.         . simvastatin (ZOCOR) 20  MG tablet   Oral   Take 20 mg by mouth every evening.         . warfarin (COUMADIN) 5 MG tablet   Oral   Take 5 mg by mouth daily. Takes 1/2 tablet daily except for 5mg  every Tuesday and Thursday          Triage Vitals: BP 162/94  Pulse 73  Temp(Src) 97.8 F (36.6 C) (Oral)  Resp 16  Ht 5\' 4"  (1.626 m)  Wt 126 lb (57.153 kg)  BMI 21.62 kg/m2  SpO2 98%  Physical Exam  Nursing note and vitals reviewed. Constitutional: She is oriented to person, place, and time. She appears well-developed and well-nourished. No distress.  HENT:  Head: Normocephalic and atraumatic.  Eyes: Conjunctivae are normal.  Neck: Normal range of motion. Neck supple.  Cardiovascular: Intact distal pulses.   Pulses:      Dorsalis pedis pulses are 2+ on the right side, and 2+ on the left side.  Pulmonary/Chest: Effort normal. No respiratory distress.  Abdominal: Soft. She exhibits no distension. There is no tenderness. There is no guarding.  Musculoskeletal: Normal range of motion.  Full range of motion of the left  leg without pain. No tenderness to palpation along the length of the leg.   Neurological: She is alert and oriented to person, place, and time.  Normal strength and sensation of bilateral lower extremities.   Skin: Skin is warm and dry.  Psychiatric: She has a normal mood and affect. Her behavior is normal.    ED Course  Procedures (including critical care time)  DIAGNOSTIC STUDIES: Oxygen Saturation is 98% on room air, normal by my interpretation.    COORDINATION OF CARE: 12:09 PM- Will order x-rays of the pelvis and left femur. Offered patient pain medication, but she declined and states it is unnecessary at this time. Discussed treatment plan with patient at bedside and patient verbalized agreement.   1:48 PM- Discussed that x-ray results indicate some degenerative changes, but are otherwise normal. Discussed that symptoms may be due to sciatica. Will discharge patient with steroids and pain medication to manage symptoms. Return precautions given. Discussed treatment plan with patient at bedside and patient verbalized agreement.    Labs Review Labs Reviewed - No data to display  Imaging Review Dg Pelvis 1-2 Views  02/18/2014   CLINICAL DATA:  pain  EXAM: PELVIS - 1-2 VIEW  COMPARISON:  None.  FINDINGS: Degenerative changes within the hips and lower lumbar spine. No evidence of acute fracture or dislocation. Phleboliths in the pelvis.  IMPRESSION: No acute osseous abnormalities.  Degenerative changes.   Electronically Signed   By: Margaree Mackintosh M.D.   On: 02/18/2014 12:59   Dg Femur Left  02/18/2014   CLINICAL DATA:  pain pain  EXAM: LEFT FEMUR - 2 VIEW  COMPARISON:  DG PELVIS 1-2 VIEWS dated 02/18/2014  FINDINGS: There is no evidence of fracture or other focal bone lesions. Joint space narrowing, mild periarticular hypertrophic spurring within the hip.  IMPRESSION: Degenerative changes without acute osseous abnormalities.   Electronically Signed   By: Margaree Mackintosh M.D.   On: 02/18/2014  13:01     EKG Interpretation None      MDM   Final diagnoses:  None    Symptoms, exam consistent with sciatica. Her x-rays are negative for other pathology. Patient is a hardy on Coumadin and I strongly doubt a DVT. She will be treated with prednisone and followups if not improving.  I personally  performed the services described in this documentation, which was scribed in my presence. The recorded information has been reviewed and is accurate.       Veryl Speak, MD 02/18/14 1409

## 2014-02-18 NOTE — Discharge Instructions (Signed)
Prednisone as prescribed.  And tramadol as prescribed as needed for pain.  If you're not improving in the next week, followup with your primary Dr. for further evaluation and return to the ER if your symptoms substantially worsen or change   Sciatica Sciatica is pain, weakness, numbness, or tingling along the path of the sciatic nerve. The nerve starts in the lower back and runs down the back of each leg. The nerve controls the muscles in the lower leg and in the back of the knee, while also providing sensation to the back of the thigh, lower leg, and the sole of your foot. Sciatica is a symptom of another medical condition. For instance, nerve damage or certain conditions, such as a herniated disk or bone spur on the spine, pinch or put pressure on the sciatic nerve. This causes the pain, weakness, or other sensations normally associated with sciatica. Generally, sciatica only affects one side of the body. CAUSES   Herniated or slipped disc.  Degenerative disk disease.  A pain disorder involving the narrow muscle in the buttocks (piriformis syndrome).  Pelvic injury or fracture.  Pregnancy.  Tumor (rare). SYMPTOMS  Symptoms can vary from mild to very severe. The symptoms usually travel from the low back to the buttocks and down the back of the leg. Symptoms can include:  Mild tingling or dull aches in the lower back, leg, or hip.  Numbness in the back of the calf or sole of the foot.  Burning sensations in the lower back, leg, or hip.  Sharp pains in the lower back, leg, or hip.  Leg weakness.  Severe back pain inhibiting movement. These symptoms may get worse with coughing, sneezing, laughing, or prolonged sitting or standing. Also, being overweight may worsen symptoms. DIAGNOSIS  Your caregiver will perform a physical exam to look for common symptoms of sciatica. He or she may ask you to do certain movements or activities that would trigger sciatic nerve pain. Other tests may  be performed to find the cause of the sciatica. These may include:  Blood tests.  X-rays.  Imaging tests, such as an MRI or CT scan. TREATMENT  Treatment is directed at the cause of the sciatic pain. Sometimes, treatment is not necessary and the pain and discomfort goes away on its own. If treatment is needed, your caregiver may suggest:  Over-the-counter medicines to relieve pain.  Prescription medicines, such as anti-inflammatory medicine, muscle relaxants, or narcotics.  Applying heat or ice to the painful area.  Steroid injections to lessen pain, irritation, and inflammation around the nerve.  Reducing activity during periods of pain.  Exercising and stretching to strengthen your abdomen and improve flexibility of your spine. Your caregiver may suggest losing weight if the extra weight makes the back pain worse.  Physical therapy.  Surgery to eliminate what is pressing or pinching the nerve, such as a bone spur or part of a herniated disk. HOME CARE INSTRUCTIONS   Only take over-the-counter or prescription medicines for pain or discomfort as directed by your caregiver.  Apply ice to the affected area for 20 minutes, 3 4 times a day for the first 48 72 hours. Then try heat in the same way.  Exercise, stretch, or perform your usual activities if these do not aggravate your pain.  Attend physical therapy sessions as directed by your caregiver.  Keep all follow-up appointments as directed by your caregiver.  Do not wear high heels or shoes that do not provide proper support.  Check  your mattress to see if it is too soft. A firm mattress may lessen your pain and discomfort. SEEK IMMEDIATE MEDICAL CARE IF:   You lose control of your bowel or bladder (incontinence).  You have increasing weakness in the lower back, pelvis, buttocks, or legs.  You have redness or swelling of your back.  You have a burning sensation when you urinate.  You have pain that gets worse when you  lie down or awakens you at night.  Your pain is worse than you have experienced in the past.  Your pain is lasting longer than 4 weeks.  You are suddenly losing weight without reason. MAKE SURE YOU:  Understand these instructions.  Will watch your condition.  Will get help right away if you are not doing well or get worse. Document Released: 10/24/2001 Document Revised: 04/30/2012 Document Reviewed: 03/10/2012 [facility] Patient Information 2014 [place].

## 2014-02-18 NOTE — ED Notes (Signed)
Patient is resting comfortably. 

## 2014-02-18 NOTE — ED Notes (Signed)
Reports radiating from left side of buttock down left leg.  Denies injury.

## 2014-03-04 NOTE — Telephone Encounter (Signed)
Encounter closed---TP 03/04/14 

## 2014-03-06 ENCOUNTER — Ambulatory Visit (INDEPENDENT_AMBULATORY_CARE_PROVIDER_SITE_OTHER): Payer: Medicare Other | Admitting: Pharmacist Clinician (PhC)/ Clinical Pharmacy Specialist

## 2014-03-06 DIAGNOSIS — I4891 Unspecified atrial fibrillation: Secondary | ICD-10-CM

## 2014-03-06 DIAGNOSIS — Z7901 Long term (current) use of anticoagulants: Secondary | ICD-10-CM

## 2014-03-06 LAB — POCT INR: INR: 4.2

## 2014-03-12 ENCOUNTER — Ambulatory Visit (INDEPENDENT_AMBULATORY_CARE_PROVIDER_SITE_OTHER): Payer: Medicare Other | Admitting: Internal Medicine

## 2014-03-12 ENCOUNTER — Encounter: Payer: Self-pay | Admitting: Internal Medicine

## 2014-03-12 VITALS — BP 137/76 | HR 79 | Temp 97.3°F | Ht 64.0 in | Wt 124.2 lb

## 2014-03-12 DIAGNOSIS — K449 Diaphragmatic hernia without obstruction or gangrene: Secondary | ICD-10-CM

## 2014-03-12 DIAGNOSIS — Z860100 Personal history of colon polyps, unspecified: Secondary | ICD-10-CM

## 2014-03-12 DIAGNOSIS — K219 Gastro-esophageal reflux disease without esophagitis: Secondary | ICD-10-CM

## 2014-03-12 NOTE — Patient Instructions (Signed)
Stop omeprazole; trial of Dexilant 60 mg daily x 3 weeks; lets [place] know in 3 weeks how it worked  Return stool sample for blood testing  May benefit from a trial of low dose Reglan in the future. - discussed side effects  Colonoscopy in 2016  May need further studies including X-rays depending on how you do with above plan.

## 2014-03-12 NOTE — Progress Notes (Signed)
Primary Care Physician:  Purvis Kilts, MD Primary Gastroenterologist:  Dr. Gala Romney  Pre-Procedure History & Physical: HPI:  Anne Anthony is a 78 y.o. female here for followup of large hiatal hernia organoaxial gastric volvulus-failed attempt at repair at [facility]., GERD. Last seen in 2011 which underwent EGD colonoscopy small adenoma removed; with family history, due for surveillance examination 2016. She has no dysphagia or odynophagia. She does have "sick attacks" from time to time when she eats sometimes -  it may last most of the day. She denies any episodes of severe abdominal pain. No melena rectal bleeding. Moves her bowels regularly. Weight 124. Has been 130 in the past. Denies any recent weight loss. She tells me labs to Dr. Delanna Ahmadi office looked good recently. No recent stool testing for occult blood. Takes omeprazole 40 mg daily with control of her typical reflux symptoms.  Past Medical History  Diagnosis Date  . Atrial flutter     successful radiofrequency ablation 2007  . Paroxysmal atrial fibrillation   . Tachycardia-bradycardia syndrome     permament pacermaker, dual chamber Medtronic on 06/2011  . Hypertension   . Hyperlipidemia   . Pacemaker   . Tubular adenoma   . Hiatal hernia   . Diverticula of colon   . Family history of colon cancer     brother    Past Surgical History  Procedure Laterality Date  . Permament pacemaker  06/2011    dual chamber , Medtronic Adapta serial # [id number]  last checked 02/05/2013  . Hiatal hernia repair  2005  . Radiofrequency ablation  06/28/2006  . Colonoscopy  08/17/2010    Dr. Gala Romney- internal hemorrhoids, o/w normal rectum, L sided diverticula, tubular adenoma  . Esophagogastroduodenoscopy  10/502011    Dr. Gala Romney- huge diaphragmatic/hiatial hernia, fundal gland polyps not manipulated o/w noral appearing gastric mucosa, patent pylorus, normal D1 & D2    Prior to Admission medications   Medication Sig Start Date End  Date Taking? Authorizing Provider  atenolol (TENORMIN) 25 MG tablet Take 25 mg by mouth daily.   Yes Historical Provider, MD  Bioflavonoid Products (ESTER C PO) Take 1 tablet by mouth daily.    Yes Historical Provider, MD  Calcium Carb-Cholecalciferol (CALCIUM 600 + D PO) Take 1 tablet by mouth daily.    Yes Historical Provider, MD  fish oil-omega-3 fatty acids 1000 MG capsule Take 1 g by mouth daily.   Yes Historical Provider, MD  lisinopril (PRINIVIL,ZESTRIL) 20 MG tablet Take 20 mg by mouth daily.   Yes Historical Provider, MD  omeprazole (PRILOSEC) 20 MG capsule Take 40 mg by mouth daily.    Yes Historical Provider, MD  simvastatin (ZOCOR) 20 MG tablet Take 20 mg by mouth every evening.   Yes Historical Provider, MD  warfarin (COUMADIN) 5 MG tablet Take 2.5-5 mg by mouth daily. Takes 1/2 tablet daily except for 5mg  every Tuesday and Friday.   Yes Historical Provider, MD  predniSONE (DELTASONE) 10 MG tablet Take 2 tablets (20 mg total) by mouth 2 (two) times daily. 02/18/14   Veryl Speak, MD  traMADol (ULTRAM) 50 MG tablet Take 1 tablet (50 mg total) by mouth every 6 (six) hours as needed. 02/18/14   Veryl Speak, MD    Allergies as of 03/12/2014  . (No Known Allergies)    No family history on file.  History   Social History  . Marital Status: Widowed    Spouse Name: [name]    Number of Children: 4  .  Years of Education: [name]   Occupational History  . Not on file.   Social History Main Topics  . Smoking status: Never Smoker   . Smokeless tobacco: Not on file  . Alcohol Use: No  . Drug Use: No  . Sexual Activity: Not on file   Other Topics Concern  . Not on file   Social History Narrative  . No narrative on file    Review of Systems: See HPI, otherwise negative ROS  Physical Exam: BP 137/76  Pulse 79  Temp(Src) 97.3 F (36.3 C) (Oral)  Ht 5\' 4"  (1.626 m)  Wt 124 lb 3.2 oz (56.337 kg)  BMI 21.31 kg/m2 General:   Alert,  Well-developed, well-nourished, pleasant and  cooperative in NAD Skin:  Intact without significant lesions or rashes. Eyes:  Sclera clear, no icterus.   Conjunctiva pink. Ears:  Normal auditory acuity. Nose:  No deformity, discharge,  or lesions. Mouth:  No deformity or lesions. Neck:  Supple; no masses or thyromegaly. No significant cervical adenopathy. Lungs:  Clear throughout to auscultation.   No wheezes, crackles, or rhonchi. No acute distress. Heart:  Regular rate and rhythm; no murmurs, clicks, rubs,  or gallops. Abdomen: Non-distended, normal bowel sounds.  Soft and nontender without appreciable mass or hepatosplenomegaly.  Pulses:  Normal pulses noted. Extremities:  Without clubbing or edema.  Impression/Plan:  Pleasant 78 year old lady with a large hiatal hernia-most of her stomach is in her chest and noted to have an organic axial rotation on upper GI series in 2011. I suspect he is experiencing symptoms related to her aberrant anatomy. Do not believe this is a surgical candidate at all. Symptoms do not sound like biliary or other etiology. No use of NSAIDs. Doubt peptic ulcer disease at this time. We mutually favored a conservative approach at the improving on her symptoms.  Recommendations:  Stop omeprazole; trial of Dexilant 60 mg daily x 3 weeks; lets [place] know in 3 weeks how it worked  Return stool sample for blood testing  May benefit from a trial of low dose Reglan in the future. - discussed side effects  Colonoscopy in 2016  May need further studies including X-rays depending on how you do with above plan.  If you ever develops severe upper abdominal or chest pain, go directly to the nearest emergency department.

## 2014-03-20 ENCOUNTER — Ambulatory Visit (INDEPENDENT_AMBULATORY_CARE_PROVIDER_SITE_OTHER): Payer: Medicare Other | Admitting: Pharmacist Clinician (PhC)/ Clinical Pharmacy Specialist

## 2014-03-20 DIAGNOSIS — I4891 Unspecified atrial fibrillation: Secondary | ICD-10-CM

## 2014-03-20 DIAGNOSIS — Z7901 Long term (current) use of anticoagulants: Secondary | ICD-10-CM

## 2014-03-20 LAB — POCT INR: INR: 3.1

## 2014-03-23 ENCOUNTER — Ambulatory Visit (INDEPENDENT_AMBULATORY_CARE_PROVIDER_SITE_OTHER): Payer: Medicare Other | Admitting: Internal Medicine

## 2014-03-23 DIAGNOSIS — Z860100 Personal history of colon polyps, unspecified: Secondary | ICD-10-CM

## 2014-03-23 LAB — IFOBT (OCCULT BLOOD): IMMUNOLOGICAL FECAL OCCULT BLOOD TEST: NEGATIVE

## 2014-03-23 NOTE — Progress Notes (Signed)
Pt return IFOBT test and it was NEGATIVE. 

## 2014-04-07 ENCOUNTER — Telehealth: Payer: Self-pay | Admitting: Internal Medicine

## 2014-04-07 MED ORDER — DEXLANSOPRAZOLE 60 MG PO CPDR
60.0000 mg | DELAYED_RELEASE_CAPSULE | Freq: Every day | ORAL | Status: DC
Start: 2014-04-07 — End: 2015-04-26

## 2014-04-07 NOTE — Telephone Encounter (Signed)
Spoke with pt- she said the dexilant was working well. She is requesting an rx sent to [place].   Dr. Gala Romney- do you want me to send this in? How many refills?

## 2014-04-07 NOTE — Telephone Encounter (Signed)
rx sent to the pharmacy. 

## 2014-04-07 NOTE — Telephone Encounter (Signed)
Pt called asking for JL. I told her that JL would be in later today after 1pm. Pt asked if I could help her with her medicines and I told her no, but I could take a message and have JL call her back. Pt said that she would call back around 130. [phone]

## 2014-04-07 NOTE — Telephone Encounter (Signed)
Refill 30 doses +11 additional refills. Thanks.

## 2014-04-10 ENCOUNTER — Ambulatory Visit (INDEPENDENT_AMBULATORY_CARE_PROVIDER_SITE_OTHER): Payer: Medicare Other | Admitting: Pharmacist Clinician (PhC)/ Clinical Pharmacy Specialist

## 2014-04-10 DIAGNOSIS — I4891 Unspecified atrial fibrillation: Secondary | ICD-10-CM

## 2014-04-10 DIAGNOSIS — Z7901 Long term (current) use of anticoagulants: Secondary | ICD-10-CM

## 2014-04-10 LAB — POCT INR: INR: 2.2

## 2014-04-21 ENCOUNTER — Other Ambulatory Visit (HOSPITAL_COMMUNITY): Payer: Self-pay | Admitting: Family Medicine

## 2014-04-21 DIAGNOSIS — Z139 Encounter for screening, unspecified: Secondary | ICD-10-CM

## 2014-05-01 ENCOUNTER — Ambulatory Visit (HOSPITAL_COMMUNITY)
Admission: RE | Admit: 2014-05-01 | Discharge: 2014-05-01 | Disposition: A | Discharge status: Home / Self Care | Payer: Medicare Other | Source: Ambulatory Visit | Attending: Family Medicine | Admitting: Family Medicine

## 2014-05-01 DIAGNOSIS — Z139 Encounter for screening, unspecified: Secondary | ICD-10-CM

## 2014-05-01 DIAGNOSIS — Z1231 Encounter for screening mammogram for malignant neoplasm of breast: Secondary | ICD-10-CM | POA: Insufficient documentation

## 2014-05-08 ENCOUNTER — Ambulatory Visit (INDEPENDENT_AMBULATORY_CARE_PROVIDER_SITE_OTHER): Payer: Medicare Other | Admitting: Pharmacist Clinician (PhC)/ Clinical Pharmacy Specialist

## 2014-05-08 DIAGNOSIS — I4891 Unspecified atrial fibrillation: Secondary | ICD-10-CM

## 2014-05-08 DIAGNOSIS — Z7901 Long term (current) use of anticoagulants: Secondary | ICD-10-CM

## 2014-05-08 LAB — POCT INR: INR: 2.3

## 2014-05-14 ENCOUNTER — Telehealth: Payer: Self-pay | Admitting: Family Medicine

## 2014-05-14 ENCOUNTER — Ambulatory Visit (INDEPENDENT_AMBULATORY_CARE_PROVIDER_SITE_OTHER): Payer: Medicare Other | Admitting: Neurology

## 2014-05-14 ENCOUNTER — Encounter: Payer: Self-pay | Admitting: Neurology

## 2014-05-14 ENCOUNTER — Telehealth: Payer: Self-pay | Admitting: Neurology

## 2014-05-14 VITALS — BP 108/68 | HR 79 | Resp 16 | Ht 63.5 in | Wt 121.0 lb

## 2014-05-14 DIAGNOSIS — M5442 Lumbago with sciatica, left side: Secondary | ICD-10-CM

## 2014-05-14 DIAGNOSIS — M549 Dorsalgia, unspecified: Secondary | ICD-10-CM | POA: Insufficient documentation

## 2014-05-14 DIAGNOSIS — M5430 Sciatica, unspecified side: Secondary | ICD-10-CM

## 2014-05-14 MED ORDER — METHYLPREDNISOLONE (PAK) 4 MG PO TABS: ORAL_TABLET | ORAL | Status: DC

## 2014-05-14 MED ORDER — GABAPENTIN 300 MG PO CAPS: ORAL_CAPSULE | ORAL | Status: DC

## 2014-05-14 NOTE — Patient Instructions (Signed)
1. Start Gabapentin 300mg : Take 1 cap at bedtime for 1 week, then increase to 1 cap twice a day 2. Start steroid pack, take as instructed on package 3. Continue muscle relaxant (Flexeril) 4. Start physical therapy 5. Followup in 6-8 weeks, call for any problems

## 2014-05-14 NOTE — Telephone Encounter (Signed)
I called patient to make her aware of physical therapy appt. She is scheduled on 05/20/2014 at 9:45am needs to arrive at 9:15am. [facility] in [place] [phone] ph [phone] fax. Order faxed/attn: Nira Conn.

## 2014-05-14 NOTE — Progress Notes (Signed)
NEUROLOGY CONSULTATION NOTE  Anne Anthony MRN: [id number] DOB: 1936/06/09  Referring provider: Dr. Sharilyn Sites Primary care provider: Dr. Sharilyn Sites  Reason for consult:  Nerve pain  Dear Dr Hilma Favors:  Thank you for your kind referral of Anne Anthony for consultation of the above symptoms. Although her history is well known to you, please allow me to reiterate it for the purpose of our medical record. The patient was accompanied to the clinic by her daughter who also provides collateral information. Records and images were personally reviewed where available.  HISTORY OF PRESENT ILLNESS: This is a very pleasant 78 year old right-handed woman with a history of paroxysmal atrial fibrillation, tachy-brady syndrome s/p pacemaker on chronic anticoagulation with Coumadin, hypertension, hyperlipidemia, presenting for severe left buttock pain radiating behind the left leg down to her ankle. Symptoms started in April 2015 when she woke up in severe pain unable to walk. She was brought to [facility] ER where xrays of the left femur and pelvis were done, showing degenerative changes without acute abnormalities.  She was given a prescription for Prednisone and Tramadol, unclear if the patient took this, she reports taking Tylenol twice a day with no relief.  The pain is worse when lying down and bending down. She reports that she has always had trouble with her back that is usually helped by chiropractor visits, however recently this has not helped.  She also has always had trouble with both legs, but more with the left leg recently.  She has intermittent numbness in the soles and toes of both feet for several years.  She denies any bowel/bladder involvement, no perineal numbness.  She denies any neck pain or arm involvement.  She denies any recent falls or heavy lifting. She denies prior similar symptoms. She denies any headaches, dizziness, diplopia, dysarthria, dysphagia.  She was started on Flexeril 10mg   TID last week but this has not helped.  She had seen her chiropractor last week and tells me an xray done of the spine had shown arthritis changes.    Laboratory Data: Lab Results  Component Value Date   INR 2.3 05/08/2014   INR 2.2 04/10/2014   INR 3.1 03/20/2014     PAST MEDICAL HISTORY: Past Medical History  Diagnosis Date  . Atrial flutter     successful radiofrequency ablation 2007  . Paroxysmal atrial fibrillation   . Tachycardia-bradycardia syndrome     permament pacermaker, dual chamber Medtronic on 06/2011  . Hypertension   . Hyperlipidemia   . Pacemaker   . Tubular adenoma   . Hiatal hernia   . Diverticula of colon   . Family history of colon cancer     brother    PAST SURGICAL HISTORY: Past Surgical History  Procedure Laterality Date  . Permament pacemaker  06/2011    dual chamber , Medtronic Adapta serial # [id number]  last checked 02/05/2013  . Hiatal hernia repair  2005  . Radiofrequency ablation  06/28/2006  . Colonoscopy  08/17/2010    Dr. Gala Romney- internal hemorrhoids, o/w normal rectum, L sided diverticula, tubular adenoma  . Esophagogastroduodenoscopy  10/502011    Dr. Gala Romney- huge diaphragmatic/hiatial hernia, fundal gland polyps not manipulated o/w noral appearing gastric mucosa, patent pylorus, normal D1 & D2  . Partial hysterectomy      MEDICATIONS: Current Outpatient Prescriptions on File Prior to Visit  Medication Sig Dispense Refill  . atenolol (TENORMIN) 25 MG tablet Take 25 mg by mouth daily.      Marland Kitchen  Bioflavonoid Products (ESTER C PO) Take 1 tablet by mouth daily.       . Calcium Carb-Cholecalciferol (CALCIUM 600 + D PO) Take 1 tablet by mouth daily.       Marland Kitchen dexlansoprazole (DEXILANT) 60 MG capsule Take 1 capsule (60 mg total) by mouth daily.  30 capsule  11  . fish oil-omega-3 fatty acids 1000 MG capsule Take 1 g by mouth daily.      Marland Kitchen lisinopril (PRINIVIL,ZESTRIL) 20 MG tablet Take 20 mg by mouth daily.      . simvastatin (ZOCOR) 20 MG tablet  Take 20 mg by mouth every evening.      . warfarin (COUMADIN) 5 MG tablet Take 2.5-5 mg by mouth daily. Takes 1/2 tablet daily except for 5mg  every Tuesday and Friday.       No current facility-administered medications on file prior to visit.    ALLERGIES: No Known Allergies  FAMILY HISTORY: Family History  Problem Relation Age of Onset  . Diabetes Mother     SOCIAL HISTORY: History   Social History  . Marital Status: Widowed    Spouse Name: [name]    Number of Children: 4  . Years of Education: [name]   Occupational History  . Not on file.   Social History Main Topics  . Smoking status: Never Smoker   . Smokeless tobacco: Not on file  . Alcohol Use: No  . Drug Use: No  . Sexual Activity: Not on file   Other Topics Concern  . Not on file   Social History Narrative  . No narrative on file    REVIEW OF SYSTEMS: Constitutional: No fevers, chills, or sweats, no generalized fatigue, change in appetite Eyes: No visual changes, double vision, eye pain Ear, nose and throat: No hearing loss, ear pain, nasal congestion, sore throat Cardiovascular: No chest pain, palpitations Respiratory:  No shortness of breath at rest or with exertion, wheezes GastrointestinaI: No nausea, vomiting, diarrhea, abdominal pain, fecal incontinence Genitourinary:  No dysuria, urinary retention or frequency Musculoskeletal:  No neck pain, +back pain Integumentary: No rash, pruritus, skin lesions Neurological: as above Psychiatric: No depression, insomnia, anxiety Endocrine: No palpitations, fatigue, diaphoresis, mood swings, change in appetite, change in weight, increased thirst Hematologic/Lymphatic:  No anemia, purpura, petechiae. Allergic/Immunologic: no itchy/runny eyes, nasal congestion, recent allergic reactions, rashes  PHYSICAL EXAM: Filed Vitals:   05/14/14 1058  BP: 108/68  Pulse: 79  Resp: 16   General: No acute distress. Pain worsened as she started to lie down and tried to sit  up. Head:  Normocephalic/atraumatic Eyes: Fundoscopic exam shows bilateral sharp discs, no vessel changes, exudates, or hemorrhages Neck: supple, no paraspinal tenderness, full range of motion Back: + tenderness to palpation on the left lower back and upper buttock region Heart: regular rate and rhythm Lungs: Clear to auscultation bilaterally. Vascular: No carotid bruits. Skin/Extremities: No rash, no edema Neurological Exam: Mental status: alert and oriented to person, place, and time, no dysarthria or aphasia, Fund of knowledge is appropriate.  Recent and remote memory are intact.  Attention and concentration are normal.    Able to name objects and repeat phrases. Cranial nerves: CN I: not tested CN II: pupils equal, round and reactive to light, visual fields intact, fundi unremarkable. CN III, IV, VI:  full range of motion, no nystagmus, no ptosis CN V: facial sensation intact CN VII: upper and lower face symmetric CN VIII: hearing intact to finger rub CN IX, X: gag intact, uvula midline CN  XI: sternocleidomastoid and trapezius muscles intact CN XII: tongue midline Bulk & Tone: normal, no fasciculations. Motor: 5/5 throughout with no pronator drift. Sensation: decreased vibration sense up to ankles bilaterally, otherwise intact to light touch, cold, pin, joint position sense.  No extinction to double simultaneous stimulation.  Romberg test negative Deep Tendon Reflexes: brisk +2 throughout, no ankle clonus, negative Hoffman's sign Plantar responses: downgoing bilaterally Cerebellar: no incoordination on finger to nose testing Gait: slow and cautious, slightly wide-based without ataxia, some difficulty with tandem walk but able Tremor: none Negative Straight leg raise test on both legs.  IMPRESSION: This is a pleasant 78 year old right-handed woman with a history of paroxysmal atrial fibrillation, tachy-brady syndrome s/p pacemaker on chronic anticoagulation with Coumadin,  presenting with a 3 month history of left buttock pain radiating behind the left leg. Motor and sensory exam normal except for mild bilateral length-dependent peripheral neuropathy.  Her reflexes are brisk throughout.  History is suggestive of left-sided sciatica, likely secondary to degenerative lumbar disease.  Physical therapy will be ordered.  Symptomatic treatment with a short course of steroids (Medrol dose pack), and maintenance with membrane stabilizing agent gabapentin will be started. She will take 300mg  qhs with slow uptitration as tolerated. Side effects were discussed. She will continue Flexeril for now, however if this continues to be ineffective, tizanidine will be considered.  If symptoms continue despite the above measures, CT lumbar and EMG/NCV will be ordered on her follow-up visit in 6-8 weeks.    Thank you for allowing me to participate in the care of this patient. Please do not hesitate to call for any questions or concerns.   Ellouise Newer, M.D.  CC: Dr. Hilma Favors

## 2014-05-14 NOTE — Telephone Encounter (Signed)
Pt called wanting to see if applying ice would be okay. C/B [phone]

## 2014-05-18 ENCOUNTER — Telehealth: Payer: Self-pay | Admitting: Neurology

## 2014-05-18 NOTE — Telephone Encounter (Signed)
Please let her know that from our office, I would increase the gabapentin earlier, take 1 cap three times a day. Our office does not prescribe any narcotics, she should call her PCP regarding this. Thanks

## 2014-05-18 NOTE — Telephone Encounter (Signed)
Pt would like something for pain she states that she is in pain wants it called in to [facility] in [place] pt phone number is [phone]

## 2014-05-18 NOTE — Telephone Encounter (Signed)
Patient was advised 05/14/14 ok to apply heat and ice alternating

## 2014-05-18 NOTE — Telephone Encounter (Signed)
Please advise 

## 2014-05-19 NOTE — Telephone Encounter (Signed)
I spoke w/ pt this morning and informed her to take 1 CAP 3 times a day for the gabapentin. Later her daughter called and asked if there is anything else she can take for the pain. She states her mother is in tears. C/b [phone]

## 2014-05-19 NOTE — Telephone Encounter (Signed)
Spoke with patients daughter advised her that we do not prescribe narcotics in this office also advised of your recommendation to increase the Gabapentin. She states that she will call he pcp for something foe pain

## 2014-05-20 ENCOUNTER — Ambulatory Visit: Payer: Medicare Other | Attending: Neurology | Admitting: Physical Therapy

## 2014-05-20 DIAGNOSIS — M545 Low back pain, unspecified: Secondary | ICD-10-CM | POA: Insufficient documentation

## 2014-05-20 DIAGNOSIS — Z950 Presence of cardiac pacemaker: Secondary | ICD-10-CM | POA: Diagnosis not present

## 2014-05-20 DIAGNOSIS — R293 Abnormal posture: Secondary | ICD-10-CM | POA: Diagnosis not present

## 2014-05-20 DIAGNOSIS — I10 Essential (primary) hypertension: Secondary | ICD-10-CM | POA: Diagnosis not present

## 2014-05-20 DIAGNOSIS — R5381 Other malaise: Secondary | ICD-10-CM | POA: Diagnosis not present

## 2014-05-20 DIAGNOSIS — M5430 Sciatica, unspecified side: Secondary | ICD-10-CM | POA: Diagnosis not present

## 2014-05-20 DIAGNOSIS — IMO0001 Reserved for inherently not codable concepts without codable children: Secondary | ICD-10-CM | POA: Insufficient documentation

## 2014-05-23 ENCOUNTER — Emergency Department (HOSPITAL_COMMUNITY)
Admission: EM | Admit: 2014-05-23 | Discharge: 2014-05-23 | Disposition: A | Discharge status: Home / Self Care | Payer: Medicare Other | Attending: Emergency Medicine | Admitting: Emergency Medicine

## 2014-05-23 ENCOUNTER — Encounter (HOSPITAL_COMMUNITY): Payer: Self-pay | Admitting: Emergency Medicine

## 2014-05-23 ENCOUNTER — Emergency Department (HOSPITAL_COMMUNITY): Payer: Medicare Other

## 2014-05-23 DIAGNOSIS — Z8719 Personal history of other diseases of the digestive system: Secondary | ICD-10-CM | POA: Insufficient documentation

## 2014-05-23 DIAGNOSIS — I4891 Unspecified atrial fibrillation: Secondary | ICD-10-CM | POA: Insufficient documentation

## 2014-05-23 DIAGNOSIS — Z950 Presence of cardiac pacemaker: Secondary | ICD-10-CM | POA: Insufficient documentation

## 2014-05-23 DIAGNOSIS — Z7901 Long term (current) use of anticoagulants: Secondary | ICD-10-CM | POA: Insufficient documentation

## 2014-05-23 DIAGNOSIS — E785 Hyperlipidemia, unspecified: Secondary | ICD-10-CM | POA: Insufficient documentation

## 2014-05-23 DIAGNOSIS — M545 Low back pain, unspecified: Secondary | ICD-10-CM | POA: Insufficient documentation

## 2014-05-23 DIAGNOSIS — I10 Essential (primary) hypertension: Secondary | ICD-10-CM | POA: Insufficient documentation

## 2014-05-23 DIAGNOSIS — B029 Zoster without complications: Secondary | ICD-10-CM

## 2014-05-23 DIAGNOSIS — Z79899 Other long term (current) drug therapy: Secondary | ICD-10-CM | POA: Insufficient documentation

## 2014-05-23 DIAGNOSIS — M5442 Lumbago with sciatica, left side: Secondary | ICD-10-CM

## 2014-05-23 DIAGNOSIS — M5430 Sciatica, unspecified side: Secondary | ICD-10-CM | POA: Insufficient documentation

## 2014-05-23 MED ORDER — HYDROCODONE-ACETAMINOPHEN 5-325 MG PO TABS
1.0000 | ORAL_TABLET | Freq: Once | ORAL | Status: AC
  Administered 2014-05-23: 1 via ORAL
  Filled 2014-05-23: qty 1

## 2014-05-23 MED ORDER — HYDROCODONE-ACETAMINOPHEN 5-325 MG PO TABS: 1.0000 | ORAL_TABLET | Freq: Four times a day (QID) | ORAL | Status: DC | PRN

## 2014-05-23 MED ORDER — ACYCLOVIR 400 MG PO TABS: 800.0000 mg | ORAL_TABLET | Freq: Every day | ORAL | Status: DC

## 2014-05-23 NOTE — ED Provider Notes (Signed)
CSN: [id number]     Arrival date & time 05/23/14  7564 History   First MD Initiated Contact with Patient 05/23/14 [id number]     Chief Complaint  Patient presents with  . Back Pain  . Leg Pain     (Consider location/radiation/quality/duration/timing/severity/associated sxs/prior Treatment) HPI Comments: Patient presents to the ED with a chief complaint of left-sided low back pain that radiates to the left leg.  She states that she has had chronic low back pain, but this pain has been worse for the past month.  She states that she has seen a neurologist and was prescribed a medrol dose pak and gabapentin with no relief.  She denies any bowel or bladder incontinence, saddle anesthesia.  Denies any fevers or chills.  The symptoms were aggravated with physical therapy.   The history is provided by the patient. No language interpreter was used.    Past Medical History  Diagnosis Date  . Atrial flutter     successful radiofrequency ablation 2007  . Paroxysmal atrial fibrillation   . Tachycardia-bradycardia syndrome     permament pacermaker, dual chamber Medtronic on 06/2011  . Hypertension   . Hyperlipidemia   . Pacemaker   . Tubular adenoma   . Hiatal hernia   . Diverticula of colon   . Family history of colon cancer     brother   Past Surgical History  Procedure Laterality Date  . Permament pacemaker  06/2011    dual chamber , Medtronic Adapta serial # [id number]  last checked 02/05/2013  . Hiatal hernia repair  2005  . Radiofrequency ablation  06/28/2006  . Colonoscopy  08/17/2010    Dr. Gala Romney- internal hemorrhoids, o/w normal rectum, L sided diverticula, tubular adenoma  . Esophagogastroduodenoscopy  10/502011    Dr. Gala Romney- huge diaphragmatic/hiatial hernia, fundal gland polyps not manipulated o/w noral appearing gastric mucosa, patent pylorus, normal D1 & D2  . Partial hysterectomy     Family History  Problem Relation Age of Onset  . Diabetes Mother    History  Substance Use  Topics  . Smoking status: Never Smoker   . Smokeless tobacco: Not on file  . Alcohol Use: No   OB History   Grav Para Term Preterm Abortions TAB SAB Ect Mult Living                 Review of Systems  All other systems reviewed and are negative.     Allergies  Review of patient's allergies indicates no known allergies.  Home Medications   Prior to Admission medications   Medication Sig Start Date End Date Taking? Authorizing Provider  atenolol (TENORMIN) 50 MG tablet Take 25 mg by mouth daily.   Yes Historical Provider, MD  Bioflavonoid Products (ESTER C PO) Take 1 tablet by mouth daily.    Yes Historical Provider, MD  Calcium Carb-Cholecalciferol (CALCIUM 600 + D PO) Take 1 tablet by mouth daily.    Yes Historical Provider, MD  dexlansoprazole (DEXILANT) 60 MG capsule Take 1 capsule (60 mg total) by mouth daily. 04/07/14  Yes Daneil Dolin, MD  fish oil-omega-3 fatty acids 1000 MG capsule Take 1 g by mouth daily.   Yes Historical Provider, MD  gabapentin (NEURONTIN) 300 MG capsule Take 300 mg by mouth 2 (two) times daily.   Yes Historical Provider, MD  lisinopril (PRINIVIL,ZESTRIL) 20 MG tablet Take 20 mg by mouth daily.   Yes Historical Provider, MD  simvastatin (ZOCOR) 20 MG tablet Take 20 mg by  mouth every evening.   Yes Historical Provider, MD  warfarin (COUMADIN) 5 MG tablet Take 2.5-5 mg by mouth daily. Takes 1/2 tablet daily except for 5mg  every Tuesday   Yes Historical Provider, MD   BP 117/70  Pulse 72  Temp(Src) 98 F (36.7 C) (Oral)  Resp 18  SpO2 100% Physical Exam  Nursing note and vitals reviewed. Constitutional: She is oriented to person, place, and time. She appears well-developed and well-nourished. No distress.  HENT:  Head: Normocephalic and atraumatic.  Eyes: Conjunctivae and EOM are normal. Right eye exhibits no discharge. Left eye exhibits no discharge. No scleral icterus.  Neck: Normal range of motion. Neck supple. No tracheal deviation present.   Cardiovascular: Normal rate, regular rhythm and normal heart sounds.  Exam reveals no gallop and no friction rub.   No murmur heard. Pulmonary/Chest: Effort normal and breath sounds normal. No respiratory distress. She has no wheezes.  Abdominal: Soft. She exhibits no distension. There is no tenderness.  Musculoskeletal: Normal range of motion.  Left lumbar paraspinal muscles tender to palpation, no bony tenderness, step-offs, or gross abnormality or deformity of spine, patient is able to ambulate, moves all extremities  Bilateral great toe extension intact Bilateral plantar/dorsiflexion intact  Neurological: She is alert and oriented to person, place, and time. She has normal reflexes.  Sensation and strength intact bilaterally Symmetrical reflexes  Skin: Skin is warm. She is not diaphoretic.  Macular rash of LLE, following dermatomal distribution, no vesicles, but possibly mild shingles.  Psychiatric: She has a normal mood and affect. Her behavior is normal. Judgment and thought content normal.    ED Course  Procedures (including critical care time) Labs Review Labs Reviewed - No data to display  Imaging Review Ct Lumbar Spine Wo Contrast  05/23/2014   CLINICAL DATA:  Low back pain and bilateral leg pain.  EXAM: CT LUMBAR SPINE WITHOUT CONTRAST  TECHNIQUE: Multidetector CT imaging of the lumbar spine was performed without intravenous contrast administration. Multiplanar CT image reconstructions were also generated.  COMPARISON:  None.  FINDINGS: The alignment is anatomic. There is disc space narrowing most pronounced at L2-L3. Moderate atheromatous change of the aortoiliac system. No worrisome osseous lesions.  Moderate osseous spurring laterally to the right at L1-2. No stenosis.  Moderate disc space narrowing with endplate sclerosis at J8-8. Foraminal protrusion on the right with far lateral spurring. Right L2 or right L3 nerve root impingement are possible.  Central protrusion at L3-4  with vacuum phenomenon on the left, posterior element hypertrophy, and mild central canal stenosis. This potentially affects the left greater than right L4 nerve roots. Foraminal narrowing is worse on the right.  Central and leftward protrusion at L4-5. Moderate posterior element hypertrophy. Moderate central canal stenosis with left greater than right L5 nerve root impingement. Slight bilateral foraminal narrowing.  Central and leftward protrusion L5-S1. Mild facet arthropathy. No definite impingement.  IMPRESSION: Multilevel spondylosis as described. No acute compression deformity or traumatic subluxation.  Potentially symptomatic neural impingement at L2-3, L3-4, and or L4-5.   Electronically Signed   By: Rolla Flatten M.D.   On: 05/23/2014 10:18     EKG Interpretation None      MDM   Final diagnoses:  Left-sided low back pain with left-sided sciatica  Shingles    Patient with low-back pain and sciatica, also with possible shingles on the LLE.  Patient seen by and discussed with Dr. Ashok Cordia.  Will order CT scan.  Treat pain.  Consider shingles, however  unknown time frame for rash appearance.  11:07 AM Back pain significantly improved with one Vicodin. Will discharge the patient to home with a prescription. We'll also treat with acyclovir or for possible shingles. Precautions given regarding shingles, and high risk populations. Recommend primary care followup. Patient is stable and ready for discharge.   Montine Circle, PA-C 05/23/14 1108

## 2014-05-23 NOTE — ED Notes (Signed)
Pt transported to CT ?

## 2014-05-23 NOTE — ED Provider Notes (Signed)
Medical screening examination/treatment/procedure(s) were conducted as a shared visit with non-physician practitioner(s) and myself.  I personally evaluated the patient during the encounter.  Pt notes hx chronic pain in legs, no acute change. States in past couple weeks increased pain left posterior/lat leg from buttock to ankle.  On exam, very erythematous lesions along region of pain, unilateral, felt c/w mild shingles rash.  Spine nt.    Mirna Mires, MD 05/23/14 [phone]

## 2014-05-23 NOTE — Discharge Instructions (Signed)
Shingles °Shingles (herpes zoster) is an infection that is caused by the same virus that causes chickenpox (varicella). The infection causes a painful skin rash and fluid-filled blisters, which eventually break open, crust over, and heal. It may occur in any area of the body, but it usually affects only one side of the body or face. The pain of shingles usually lasts about 1 month. However, some people with shingles may develop long-term (chronic) pain in the affected area of the body. °Shingles often occurs many years after the person had chickenpox. It is more common: °· In people older than 50 years. °· In people with weakened immune systems, such as those with HIV, AIDS, or cancer. °· In people taking medicines that weaken the immune system, such as transplant medicines. °· In people under great stress. °CAUSES  °Shingles is caused by the varicella zoster virus (VZV), which also causes chickenpox. After a person is infected with the virus, it can remain in the person's body for years in an inactive state (dormant). To cause shingles, the virus reactivates and breaks out as an infection in a nerve root. °The virus can be spread from person to person (contagious) through contact with open blisters of the shingles rash. It will only spread to people who have not had chickenpox. When these people are exposed to the virus, they may develop chickenpox. They will not develop shingles. Once the blisters scab over, the person is no longer contagious and cannot spread the virus to others. °SYMPTOMS  °Shingles shows up in stages. The initial symptoms may be pain, itching, and tingling in an area of the skin. This pain is usually described as burning, stabbing, or throbbing. In a few days or weeks, a painful red rash will appear in the area where the pain, itching, and tingling were felt. The rash is usually on one side of the body in a band or belt-like pattern. Then, the rash usually turns into fluid-filled blisters. They  will scab over and dry up in approximately 2-3 weeks. °Flu-like symptoms may also occur with the initial symptoms, the rash, or the blisters. These may include: °· Fever. °· Chills. °· Headache. °· Upset stomach. °DIAGNOSIS  °Your caregiver will perform a skin exam to diagnose shingles. Skin scrapings or fluid samples may also be taken from the blisters. This sample will be examined under a microscope or sent to a lab for further testing. °TREATMENT  °There is no specific cure for shingles. Your caregiver will likely prescribe medicines to help you manage the pain, recover faster, and avoid long-term problems. This may include antiviral drugs, anti-inflammatory drugs, and pain medicines. °HOME CARE INSTRUCTIONS  °· Take a cool bath or apply cool compresses to the area of the rash or blisters as directed. This may help with the pain and itching.   °· Only take over-the-counter or prescription medicines as directed by your caregiver.   °· Rest as directed by your caregiver. °· Keep your rash and blisters clean with mild soap and cool water or as directed by your caregiver.  °· Do not pick your blisters or scratch your rash. Apply an anti-itch cream or numbing creams to the affected area as directed by your caregiver. °· Keep your shingles rash covered with a loose bandage (dressing). °· Avoid skin contact with: °¨ Babies.   °¨ Pregnant women.   °¨ Children with eczema.   °¨ Elderly people with transplants.   °¨ People with chronic illnesses, such as leukemia or AIDS.   °· Wear loose-fitting clothing to help ease the   pain of material rubbing against the rash.  Keep all follow-up appointments with your caregiver.If the area involved is on your face, you may receive a referral for follow-up to a specialist, such as an eye doctor (ophthalmologist) or an ear, nose, and throat (ENT) doctor. Keeping all follow-up appointments will help you avoid eye complications, chronic pain, or disability.  SEEK IMMEDIATE MEDICAL  CARE IF:   You have facial pain, pain around the eye area, or loss of feeling on one side of your face.  You have ear pain or ringing in your ear.  You have loss of taste.  Your pain is not relieved with prescribed medicines.   Your redness or swelling spreads.   You have more pain and swelling.  Your condition is worsening or has changed.   You have a feveror persistent symptoms for more than 2-3 days.  You have a fever and your symptoms suddenly get worse. MAKE SURE YOU:  Understand these instructions.  Will watch your condition.  Will get help right away if you are not doing well or get worse. Document Released: 10/30/2005 Document Revised: 07/24/2012 Document Reviewed: 06/13/2012 [facility] Patient Information 2015 [place], [place]. This information is not intended to replace advice given to you by your health care provider. Make sure you discuss any questions you have with your health care provider. Sciatica Sciatica is pain, weakness, numbness, or tingling along the path of the sciatic nerve. The nerve starts in the lower back and runs down the back of each leg. The nerve controls the muscles in the lower leg and in the back of the knee, while also providing sensation to the back of the thigh, lower leg, and the sole of your foot. Sciatica is a symptom of another medical condition. For instance, nerve damage or certain conditions, such as a herniated disk or bone spur on the spine, pinch or put pressure on the sciatic nerve. This causes the pain, weakness, or other sensations normally associated with sciatica. Generally, sciatica only affects one side of the body. CAUSES   Herniated or slipped disc.  Degenerative disk disease.  A pain disorder involving the narrow muscle in the buttocks (piriformis syndrome).  Pelvic injury or fracture.  Pregnancy.  Tumor (rare). SYMPTOMS  Symptoms can vary from mild to very severe. The symptoms usually travel from the low back to  the buttocks and down the back of the leg. Symptoms can include:  Mild tingling or dull aches in the lower back, leg, or hip.  Numbness in the back of the calf or sole of the foot.  Burning sensations in the lower back, leg, or hip.  Sharp pains in the lower back, leg, or hip.  Leg weakness.  Severe back pain inhibiting movement. These symptoms may get worse with coughing, sneezing, laughing, or prolonged sitting or standing. Also, being overweight may worsen symptoms. DIAGNOSIS  Your caregiver will perform a physical exam to look for common symptoms of sciatica. He or she may ask you to do certain movements or activities that would trigger sciatic nerve pain. Other tests may be performed to find the cause of the sciatica. These may include:  Blood tests.  X-rays.  Imaging tests, such as an MRI or CT scan. TREATMENT  Treatment is directed at the cause of the sciatic pain. Sometimes, treatment is not necessary and the pain and discomfort goes away on its own. If treatment is needed, your caregiver may suggest:  Over-the-counter medicines to relieve pain.  Prescription medicines, such  as anti-inflammatory medicine, muscle relaxants, or narcotics.  Applying heat or ice to the painful area.  Steroid injections to lessen pain, irritation, and inflammation around the nerve.  Reducing activity during periods of pain.  Exercising and stretching to strengthen your abdomen and improve flexibility of your spine. Your caregiver may suggest losing weight if the extra weight makes the back pain worse.  Physical therapy.  Surgery to eliminate what is pressing or pinching the nerve, such as a bone spur or part of a herniated disk. HOME CARE INSTRUCTIONS   Only take over-the-counter or prescription medicines for pain or discomfort as directed by your caregiver.  Apply ice to the affected area for 20 minutes, 3-4 times a day for the first 48-72 hours. Then try heat in the same  way.  Exercise, stretch, or perform your usual activities if these do not aggravate your pain.  Attend physical therapy sessions as directed by your caregiver.  Keep all follow-up appointments as directed by your caregiver.  Do not wear high heels or shoes that do not provide proper support.  Check your mattress to see if it is too soft. A firm mattress may lessen your pain and discomfort. SEEK IMMEDIATE MEDICAL CARE IF:   You lose control of your bowel or bladder (incontinence).  You have increasing weakness in the lower back, pelvis, buttocks, or legs.  You have redness or swelling of your back.  You have a burning sensation when you urinate.  You have pain that gets worse when you lie down or awakens you at night.  Your pain is worse than you have experienced in the past.  Your pain is lasting longer than 4 weeks.  You are suddenly losing weight without reason. MAKE SURE YOU:  Understand these instructions.  Will watch your condition.  Will get help right away if you are not doing well or get worse. Document Released: 10/24/2001 Document Revised: 04/30/2012 Document Reviewed: 03/10/2012 [facility] Patient Information 2015 [place], [place]. This information is not intended to replace advice given to you by your health care provider. Make sure you discuss any questions you have with your health care provider.

## 2014-05-23 NOTE — ED Notes (Signed)
PT c/o lt lower back pain that radiates down legs.  Pt seeing neurologist for this pain.  Was given neurontin and steroids.  States that they aren't helping. States this pain has been going on for greater than 1 month.

## 2014-05-23 NOTE — ED Notes (Signed)
Pt reports that she noticed a rash on her left leg today. Red rash to lower left leg and left upper thigh.

## 2014-05-26 ENCOUNTER — Encounter: Payer: Medicare Other | Admitting: Physical Therapy

## 2014-05-26 ENCOUNTER — Telehealth: Payer: Self-pay | Admitting: Neurology

## 2014-05-26 NOTE — Telephone Encounter (Signed)
I reviewed CT lumbar, showing potential nerve impingement in several areas, likely the cause of her pain. Continue gabapentin, we can increase if she is tolerating the dose.

## 2014-05-26 NOTE — Telephone Encounter (Signed)
Spoke with patients daughter she states that her mother currently has shingles and can not continue her PT until she is cleared of that however she did not feel as though it was helping anyway. Patients daughter also thinks she needs to move forward with a Mri. Please advise

## 2014-05-26 NOTE — Telephone Encounter (Signed)
Patient already had a CT lumbar spine

## 2014-05-26 NOTE — Telephone Encounter (Signed)
Daughter called, wants to provide update on pt. CB# [phone] / Sherri S.

## 2014-05-26 NOTE — Telephone Encounter (Signed)
She has already had a Ct lumbar spine

## 2014-05-26 NOTE — Telephone Encounter (Signed)
She cannot get MRI due to pacemaker, pls schedule CT lumbar spine without contrast for back pain. Thanks

## 2014-05-28 ENCOUNTER — Encounter: Payer: Medicare Other | Admitting: Physical Therapy

## 2014-06-02 ENCOUNTER — Ambulatory Visit: Payer: Medicare Other | Admitting: Physical Therapy

## 2014-06-02 DIAGNOSIS — IMO0001 Reserved for inherently not codable concepts without codable children: Secondary | ICD-10-CM | POA: Diagnosis not present

## 2014-06-02 NOTE — Telephone Encounter (Signed)
Spoke with patient she had not yet increased to the two tablets daily advised patient to go ahead and increase to two tablets daily and give [place] a call in a week or so

## 2014-06-05 ENCOUNTER — Ambulatory Visit: Payer: Medicare Other | Admitting: Pharmacist Clinician (PhC)/ Clinical Pharmacy Specialist

## 2014-06-05 ENCOUNTER — Ambulatory Visit: Payer: Medicare Other | Admitting: *Deleted

## 2014-06-05 DIAGNOSIS — IMO0001 Reserved for inherently not codable concepts without codable children: Secondary | ICD-10-CM | POA: Diagnosis not present

## 2014-06-08 ENCOUNTER — Ambulatory Visit (INDEPENDENT_AMBULATORY_CARE_PROVIDER_SITE_OTHER): Payer: Medicare Other | Admitting: Pharmacist Clinician (PhC)/ Clinical Pharmacy Specialist

## 2014-06-08 DIAGNOSIS — I4891 Unspecified atrial fibrillation: Secondary | ICD-10-CM

## 2014-06-08 DIAGNOSIS — Z7901 Long term (current) use of anticoagulants: Secondary | ICD-10-CM

## 2014-06-08 LAB — POCT INR: INR: 5.7

## 2014-06-09 ENCOUNTER — Ambulatory Visit: Payer: Medicare Other | Admitting: Physical Therapy

## 2014-06-09 DIAGNOSIS — IMO0001 Reserved for inherently not codable concepts without codable children: Secondary | ICD-10-CM | POA: Diagnosis not present

## 2014-06-11 ENCOUNTER — Ambulatory Visit: Payer: Medicare Other | Admitting: Physical Therapy

## 2014-06-12 ENCOUNTER — Encounter (HOSPITAL_COMMUNITY): Payer: Self-pay | Admitting: Emergency Medicine

## 2014-06-12 ENCOUNTER — Emergency Department (HOSPITAL_COMMUNITY)
Admission: EM | Admit: 2014-06-12 | Discharge: 2014-06-12 | Disposition: A | Discharge status: Home / Self Care | Payer: Medicare Other | Attending: Emergency Medicine | Admitting: Emergency Medicine

## 2014-06-12 ENCOUNTER — Emergency Department (HOSPITAL_COMMUNITY): Payer: Medicare Other

## 2014-06-12 DIAGNOSIS — I4891 Unspecified atrial fibrillation: Secondary | ICD-10-CM | POA: Insufficient documentation

## 2014-06-12 DIAGNOSIS — E785 Hyperlipidemia, unspecified: Secondary | ICD-10-CM | POA: Diagnosis not present

## 2014-06-12 DIAGNOSIS — R791 Abnormal coagulation profile: Secondary | ICD-10-CM

## 2014-06-12 DIAGNOSIS — N390 Urinary tract infection, site not specified: Secondary | ICD-10-CM

## 2014-06-12 DIAGNOSIS — Z90711 Acquired absence of uterus with remaining cervical stump: Secondary | ICD-10-CM | POA: Insufficient documentation

## 2014-06-12 DIAGNOSIS — R079 Chest pain, unspecified: Secondary | ICD-10-CM | POA: Insufficient documentation

## 2014-06-12 DIAGNOSIS — I509 Heart failure, unspecified: Secondary | ICD-10-CM

## 2014-06-12 DIAGNOSIS — Z7901 Long term (current) use of anticoagulants: Secondary | ICD-10-CM | POA: Insufficient documentation

## 2014-06-12 DIAGNOSIS — K449 Diaphragmatic hernia without obstruction or gangrene: Secondary | ICD-10-CM

## 2014-06-12 DIAGNOSIS — Z9889 Other specified postprocedural states: Secondary | ICD-10-CM | POA: Insufficient documentation

## 2014-06-12 DIAGNOSIS — Z79899 Other long term (current) drug therapy: Secondary | ICD-10-CM | POA: Diagnosis not present

## 2014-06-12 DIAGNOSIS — I10 Essential (primary) hypertension: Secondary | ICD-10-CM | POA: Diagnosis not present

## 2014-06-12 DIAGNOSIS — Z950 Presence of cardiac pacemaker: Secondary | ICD-10-CM | POA: Insufficient documentation

## 2014-06-12 DIAGNOSIS — K3189 Other diseases of stomach and duodenum: Secondary | ICD-10-CM

## 2014-06-12 LAB — I-STAT TROPONIN, ED: TROPONIN I, POC: 0.01 ng/mL (ref 0.00–0.08)

## 2014-06-12 LAB — PROTIME-INR
INR: 1.65 — AB (ref 0.00–1.49)
PROTHROMBIN TIME: 19.5 s — AB (ref 11.6–15.2)

## 2014-06-12 LAB — URINALYSIS, ROUTINE W REFLEX MICROSCOPIC
BILIRUBIN URINE: NEGATIVE
Glucose, UA: NEGATIVE mg/dL
Hgb urine dipstick: NEGATIVE
KETONES UR: NEGATIVE mg/dL
NITRITE: NEGATIVE
PROTEIN: NEGATIVE mg/dL
Specific Gravity, Urine: 1.009 (ref 1.005–1.030)
UROBILINOGEN UA: 0.2 mg/dL (ref 0.0–1.0)
pH: 7 (ref 5.0–8.0)

## 2014-06-12 LAB — CBC WITH DIFFERENTIAL/PLATELET
Basophils Absolute: 0 10*3/uL (ref 0.0–0.1)
Basophils Relative: 0 % (ref 0–1)
Eosinophils Absolute: 0.1 10*3/uL (ref 0.0–0.7)
Eosinophils Relative: 1 % (ref 0–5)
HCT: 40.2 % (ref 36.0–46.0)
Hemoglobin: 13.5 g/dL (ref 12.0–15.0)
LYMPHS PCT: 27 % (ref 12–46)
Lymphs Abs: 1.9 10*3/uL (ref 0.7–4.0)
MCH: 33 pg (ref 26.0–34.0)
MCHC: 33.6 g/dL (ref 30.0–36.0)
MCV: 98.3 fL (ref 78.0–100.0)
Monocytes Absolute: 0.5 10*3/uL (ref 0.1–1.0)
Monocytes Relative: 7 % (ref 3–12)
Neutro Abs: 4.5 10*3/uL (ref 1.7–7.7)
Neutrophils Relative %: 65 % (ref 43–77)
PLATELETS: 247 10*3/uL (ref 150–400)
RBC: 4.09 MIL/uL (ref 3.87–5.11)
RDW: 13.3 % (ref 11.5–15.5)
WBC: 7 10*3/uL (ref 4.0–10.5)

## 2014-06-12 LAB — URINE MICROSCOPIC-ADD ON

## 2014-06-12 LAB — COMPREHENSIVE METABOLIC PANEL
ALT: 23 U/L (ref 0–35)
AST: 30 U/L (ref 0–37)
Albumin: 3.7 g/dL (ref 3.5–5.2)
Alkaline Phosphatase: 55 U/L (ref 39–117)
Anion gap: 14 (ref 5–15)
BUN: 18 mg/dL (ref 6–23)
CALCIUM: 10 mg/dL (ref 8.4–10.5)
CHLORIDE: 101 meq/L (ref 96–112)
CO2: 26 meq/L (ref 19–32)
Creatinine, Ser: 1.1 mg/dL (ref 0.50–1.10)
GFR calc Af Amer: 55 mL/min — ABNORMAL LOW (ref >90)
GFR, EST NON AFRICAN AMERICAN: 48 mL/min — AB (ref >90)
Glucose, Bld: 117 mg/dL — ABNORMAL HIGH (ref 70–99)
Potassium: 4.7 mEq/L (ref 3.7–5.3)
SODIUM: 141 meq/L (ref 137–147)
Total Bilirubin: 0.4 mg/dL (ref 0.3–1.2)
Total Protein: 7.1 g/dL (ref 6.0–8.3)

## 2014-06-12 LAB — LIPASE, BLOOD: Lipase: 19 U/L (ref 11–59)

## 2014-06-12 LAB — PRO B NATRIURETIC PEPTIDE: PRO B NATRI PEPTIDE: 857.5 pg/mL — AB (ref 0–450)

## 2014-06-12 MED ORDER — CIPROFLOXACIN HCL 500 MG PO TABS
500.0000 mg | ORAL_TABLET | Freq: Once | ORAL | Status: AC
  Administered 2014-06-12: 500 mg via ORAL
  Filled 2014-06-12: qty 1

## 2014-06-12 MED ORDER — CIPROFLOXACIN HCL 500 MG PO TABS: 500.0000 mg | ORAL_TABLET | Freq: Two times a day (BID) | ORAL | Status: DC

## 2014-06-12 MED ORDER — FUROSEMIDE 20 MG PO TABS: 20.0000 mg | ORAL_TABLET | Freq: Every day | ORAL | Status: DC

## 2014-06-12 MED ORDER — SODIUM CHLORIDE 0.9 % IV BOLUS (SEPSIS)
1000.0000 mL | Freq: Once | INTRAVENOUS | Status: AC
  Administered 2014-06-12: 1000 mL via INTRAVENOUS

## 2014-06-12 MED ORDER — IOHEXOL 350 MG/ML SOLN
80.0000 mL | Freq: Once | INTRAVENOUS | Status: AC | PRN
  Administered 2014-06-12: 80 mL via INTRAVENOUS

## 2014-06-12 MED ORDER — FUROSEMIDE 40 MG PO TABS
20.0000 mg | ORAL_TABLET | Freq: Once | ORAL | Status: AC
  Administered 2014-06-12: 20 mg via ORAL
  Filled 2014-06-12: qty 1

## 2014-06-12 NOTE — ED Notes (Signed)
Per Lab: BNP to be added to labs already collected.

## 2014-06-12 NOTE — ED Notes (Signed)
Complaining of Pain in Both shoulder blades. Pain did not start until patient ate.

## 2014-06-12 NOTE — Discharge Instructions (Signed)
Take cipro for 7 days.   Take lasix 20 mg daily for 2 days.   You have large hiatal hernia. Eat small frequent meals.   You need echo with your cardiologist.   You need to see your GI doctor and regular doctor.   Return to ER if you have worse chest pain, vomiting, fevers.

## 2014-06-12 NOTE — ED Notes (Signed)
Bed: FX25 Expected date:  Expected time:  Means of arrival:  Comments: EMS-shoulder blade pain

## 2014-06-12 NOTE — ED Provider Notes (Signed)
CSN: [id number]     Arrival date & time 06/12/14  1626 History   First MD Initiated Contact with Patient 06/12/14 1632     Chief Complaint  Patient presents with  . Back Pain    Upper back near shoulder blades.     (Consider location/radiation/quality/duration/timing/severity/associated sxs/prior Treatment) The history is provided by the patient.  Anne Anthony is a 78 y.o. female hx of aflutter on coumadin, HTN, pacemaker, hiatal hernia here with chest pain. She's been having intermittent chest pain and shortness of breath over the last several weeks. It is generally associated with food but does have some exertional component occasionally. She said the pain sometimes radiates to her shoulder blades. He also has generalized weakness. No abdominal pain.     Past Medical History  Diagnosis Date  . Atrial flutter     successful radiofrequency ablation 2007  . Paroxysmal atrial fibrillation   . Tachycardia-bradycardia syndrome     permament pacermaker, dual chamber Medtronic on 06/2011  . Hypertension   . Hyperlipidemia   . Pacemaker   . Tubular adenoma   . Hiatal hernia   . Diverticula of colon   . Family history of colon cancer     brother   Past Surgical History  Procedure Laterality Date  . Permament pacemaker  06/2011    dual chamber , Medtronic Adapta serial # [id number]  last checked 02/05/2013  . Hiatal hernia repair  2005  . Radiofrequency ablation  06/28/2006  . Colonoscopy  08/17/2010    Dr. Gala Romney- internal hemorrhoids, o/w normal rectum, L sided diverticula, tubular adenoma  . Esophagogastroduodenoscopy  10/502011    Dr. Gala Romney- huge diaphragmatic/hiatial hernia, fundal gland polyps not manipulated o/w noral appearing gastric mucosa, patent pylorus, normal D1 & D2  . Partial hysterectomy     Family History  Problem Relation Age of Onset  . Diabetes Mother    History  Substance Use Topics  . Smoking status: Never Smoker   . Smokeless tobacco: Not on file  .  Alcohol Use: No   OB History   Grav Para Term Preterm Abortions TAB SAB Ect Mult Living                 Review of Systems  Respiratory: Positive for shortness of breath.   Cardiovascular: Positive for chest pain.  Musculoskeletal: Positive for back pain.  All other systems reviewed and are negative.     Allergies  Review of patient's allergies indicates no known allergies.  Home Medications   Prior to Admission medications   Medication Sig Start Date End Date Taking? Authorizing Provider  acyclovir (ZOVIRAX) 400 MG tablet Take 2 tablets (800 mg total) by mouth 5 (five) times daily. 05/23/14   Montine Circle, PA-C  atenolol (TENORMIN) 50 MG tablet Take 25 mg by mouth daily.    Historical Provider, MD  Bioflavonoid Products (ESTER C PO) Take 1 tablet by mouth daily.     Historical Provider, MD  Calcium Carb-Cholecalciferol (CALCIUM 600 + D PO) Take 1 tablet by mouth daily.     Historical Provider, MD  dexlansoprazole (DEXILANT) 60 MG capsule Take 1 capsule (60 mg total) by mouth daily. 04/07/14   Daneil Dolin, MD  fish oil-omega-3 fatty acids 1000 MG capsule Take 1 g by mouth daily.    Historical Provider, MD  gabapentin (NEURONTIN) 300 MG capsule Take 300 mg by mouth 2 (two) times daily.    Historical Provider, MD  HYDROcodone-acetaminophen (NORCO/VICODIN) 5-325 MG per  tablet Take 1 tablet by mouth every 6 (six) hours as needed for moderate pain or severe pain. 05/23/14   Montine Circle, PA-C  lisinopril (PRINIVIL,ZESTRIL) 20 MG tablet Take 20 mg by mouth daily.    Historical Provider, MD  simvastatin (ZOCOR) 20 MG tablet Take 20 mg by mouth every evening.    Historical Provider, MD  warfarin (COUMADIN) 5 MG tablet Take 2.5-5 mg by mouth daily. Takes 1/2 tablet daily except for 5mg  every Tuesday    Historical Provider, MD   BP 125/55  Pulse 66  Temp(Src) 97.9 F (36.6 C) (Oral)  Resp 21  SpO2 96% Physical Exam  Nursing note and vitals reviewed. Constitutional: She is  oriented to person, place, and time.  Chronically ill, NAD   HENT:  Head: Normocephalic.  Mouth/Throat: Oropharynx is clear and moist.  Eyes: Conjunctivae are normal. Pupils are equal, round, and reactive to light.  R medial eye with subconjunctival hemorrhage, no hyphema   Neck: Normal range of motion. Neck supple.  Cardiovascular: Normal rate, regular rhythm and normal heart sounds.   Pulmonary/Chest: Effort normal and breath sounds normal. No respiratory distress. She has no wheezes. She has no rales.  Abdominal: Soft. Bowel sounds are normal. She exhibits no distension. There is no tenderness. There is no rebound and no guarding.  Musculoskeletal: Normal range of motion. She exhibits no edema.  Neurological: She is alert and oriented to person, place, and time. No cranial nerve deficit. Coordination normal.  Skin: Skin is warm and dry.  Psychiatric: She has a normal mood and affect. Her behavior is normal. Judgment and thought content normal.    ED Course  Procedures (including critical care time) Labs Review Labs Reviewed  COMPREHENSIVE METABOLIC PANEL - Abnormal; Notable for the following:    Glucose, Bld 117 (*)    GFR calc non Af Amer 48 (*)    GFR calc Af Amer 55 (*)    All other components within normal limits  PROTIME-INR - Abnormal; Notable for the following:    Prothrombin Time 19.5 (*)    INR 1.65 (*)    All other components within normal limits  URINALYSIS, ROUTINE W REFLEX MICROSCOPIC - Abnormal; Notable for the following:    Leukocytes, UA TRACE (*)    All other components within normal limits  URINE MICROSCOPIC-ADD ON - Abnormal; Notable for the following:    Bacteria, UA FEW (*)    All other components within normal limits  PRO B NATRIURETIC PEPTIDE - Abnormal; Notable for the following:    Pro B Natriuretic peptide (BNP) 857.5 (*)    All other components within normal limits  URINE CULTURE  CBC WITH DIFFERENTIAL  LIPASE, BLOOD  I-STAT TROPOININ, ED     Imaging Review Dg Chest 2 View  06/12/2014   CLINICAL DATA:  Pain and shortness of breath  EXAM: CHEST  2 VIEW  COMPARISON:  06/30/2011  FINDINGS: A large hiatal hernia is again identified with considerable food stuffs within the stomach. A pacing device is again seen. Mild cardiomegaly is again noted. No focal infiltrate or sizable effusion is seen  IMPRESSION: Large hiatal hernia.  No other focal abnormality is noted.   Electronically Signed   By: Inez Catalina M.D.   On: 06/12/2014 16:53   Ct Angio Chest Pe W/cm &/or Wo Cm  06/12/2014   CLINICAL DATA:  Bilateral shoulder blade pain (post prandial). Past history of DVT.  EXAM: CT ANGIOGRAPHY CHEST WITH CONTRAST  TECHNIQUE: Multidetector CT  imaging of the chest was performed using the standard protocol during bolus administration of intravenous contrast. Multiplanar CT image reconstructions and MIPs were obtained to evaluate the vascular anatomy.  CONTRAST:  77mL OMNIPAQUE IOHEXOL 350 MG/ML SOLN  COMPARISON:  Chest x-ray obtained earlier today 06/12/2014; CT lumbar spine 05/23/2014  FINDINGS: Mediastinum: Massive hiatal hernia. Nearly completely intra thoracic stomach. The intrathoracic portion of the stomach is diffusely distended with flocculent material. Difficult to assess for gastric volvulus in the absence of intravenous contrast. Given the large dilatation of the stomach proximal to the gastric antrum, volvulus is suspected. Unremarkable appearance of the thyroid gland. No mediastinal adenopathy.  Heart/Vascular: Adequate opacification of the pulmonary arteries to the segmental level. No central filling defect to suggest acute pulmonary embolus. Normal caliber main and central pulmonary arteries. The heart is compressed anteriorly secondary to the massively dilated hiatal hernia. Right heart dilatation. No pericardial effusion. Left subclavian approach cardiac rhythm maintenance device. Leads project over the right atrium and right ventricular  apex. Scattered atherosclerotic vascular calcifications without aneurysmal dilatation or evidence of aortic dissection.  Lungs/Pleura: Small right pleural effusion. Compressive atelectasis in the medial aspect of all lobes of both lungs secondary to the massive hiatal hernia. Right apical pleural parenchymal scarring. Interlobular septal thickening and subtle ground-glass attenuation opacity in the upper lungs most consistent with a mild interstitial pulmonary edema. No focal airspace consolidation.  Bones/Soft Tissues: No acute fracture or aggressive appearing lytic or blastic osseous lesion. T1 hemangioma.  Upper Abdomen: As above.  Otherwise, unremarkable.  Review of the MIP images confirms the above findings.  IMPRESSION: 1. Massive sliding-type hiatal hernia. Suspect an element of a gastric volvulus given the marked dilatation and fluid and gas filled into thoracic stomach proximal to the gastric antrum which is in the region of the esophageal hiatus. 2. Compressive atelectasis in the medial aspect of all lobes of the lungs secondary to the massive and dilated intra thoracic stomach. 3. Right heart enlargement. 4. Mild interstitial pulmonary edema suggests mild CHF. 5. Small right pleural effusion.   Electronically Signed   By: Jacqulynn Cadet M.D.   On: 06/12/2014 18:55     EKG Interpretation None      MDM   Final diagnoses:  None    KAYLANNI EZELLE is a 78 y.o. female here with SOB, chest pain. Consider ACS vs reflux from hiatal hernia. On coumadin so will check INR. If therapeutic, unlikely to have PE.  Will get labs, CXR, trop x 1 (symptoms for several days).   8:54 PM Xray showed large hiatal hernia. CT ordered sine INR subtherapeutic and didn't show PE. But there is massive hiatal hernia with gastric volvulus. I called [facility], who states that if she is able to tolerate PO and not hypokalemic then doesn't need admission. Tolerated fluids. BNP slightly elevated, will give 3 days of  lasix. Also possible UTI and patient has some lower abdominal pressure sensation. Will give cipro empirically and send off culture.        Wandra Arthurs, MD 06/12/14 2056

## 2014-06-14 LAB — URINE CULTURE
Colony Count: NO GROWTH
Culture: NO GROWTH

## 2014-06-15 ENCOUNTER — Encounter: Payer: Self-pay | Admitting: Cardiovascular Disease

## 2014-06-15 ENCOUNTER — Ambulatory Visit (INDEPENDENT_AMBULATORY_CARE_PROVIDER_SITE_OTHER): Payer: Medicare Other | Admitting: Pharmacist Clinician (PhC)/ Clinical Pharmacy Specialist

## 2014-06-15 ENCOUNTER — Ambulatory Visit (INDEPENDENT_AMBULATORY_CARE_PROVIDER_SITE_OTHER): Payer: Medicare Other | Admitting: *Deleted

## 2014-06-15 ENCOUNTER — Telehealth: Payer: Self-pay | Admitting: Internal Medicine

## 2014-06-15 ENCOUNTER — Ambulatory Visit (INDEPENDENT_AMBULATORY_CARE_PROVIDER_SITE_OTHER): Payer: Medicare Other | Admitting: Cardiovascular Disease

## 2014-06-15 VITALS — BP 110/60 | HR 68 | Resp 16 | Ht 63.0 in | Wt 117.3 lb

## 2014-06-15 DIAGNOSIS — I10 Essential (primary) hypertension: Secondary | ICD-10-CM

## 2014-06-15 DIAGNOSIS — R0602 Shortness of breath: Secondary | ICD-10-CM

## 2014-06-15 DIAGNOSIS — Z7901 Long term (current) use of anticoagulants: Secondary | ICD-10-CM

## 2014-06-15 DIAGNOSIS — I495 Sick sinus syndrome: Secondary | ICD-10-CM

## 2014-06-15 DIAGNOSIS — I4891 Unspecified atrial fibrillation: Secondary | ICD-10-CM

## 2014-06-15 DIAGNOSIS — I480 Paroxysmal atrial fibrillation: Secondary | ICD-10-CM

## 2014-06-15 DIAGNOSIS — Z950 Presence of cardiac pacemaker: Secondary | ICD-10-CM

## 2014-06-15 DIAGNOSIS — I482 Chronic atrial fibrillation, unspecified: Secondary | ICD-10-CM

## 2014-06-15 DIAGNOSIS — E785 Hyperlipidemia, unspecified: Secondary | ICD-10-CM

## 2014-06-15 LAB — MDC_IDC_ENUM_SESS_TYPE_INCLINIC
Battery Remaining Longevity: 11.5
Battery Voltage: 2.79 V
Brady Statistic AP VP Percent: 2.8 %
Brady Statistic AS VP Percent: 0.1 % — CL
Brady Statistic AS VS Percent: 16.4 %
Lead Channel Impedance Value: 545 Ohm
Lead Channel Impedance Value: 560 Ohm
Lead Channel Pacing Threshold Pulse Width: 0.4 ms
Lead Channel Sensing Intrinsic Amplitude: 11.2 mV
Lead Channel Sensing Intrinsic Amplitude: 5.6 mV
Lead Channel Setting Pacing Amplitude: 1.5 V
Lead Channel Setting Pacing Pulse Width: 0.4 ms
Lead Channel Setting Sensing Sensitivity: 4 mV
MDC IDC MSMT BATTERY IMPEDANCE: 184 Ohm
MDC IDC MSMT LEADCHNL RA PACING THRESHOLD AMPLITUDE: 0.5 V
MDC IDC MSMT LEADCHNL RA PACING THRESHOLD PULSEWIDTH: 0.4 ms
MDC IDC MSMT LEADCHNL RV PACING THRESHOLD AMPLITUDE: 0.75 V
MDC IDC SET LEADCHNL RV PACING AMPLITUDE: 2 V
MDC IDC STAT BRADY AP VS PERCENT: 80.9 %

## 2014-06-15 LAB — POCT INR: INR: 1.3

## 2014-06-15 NOTE — Telephone Encounter (Signed)
The daughter Lincoln Brigham) called to make OV for patient regarding a large hiatal hernia. Pt was seen recently at [facility] ED. Daughter is aware of OV with RMR ONLY per patient on 8/25 at 430. Daughter is requesting a diet sheet of foods the patient is able and not able to eat and asked for [place] to mail to her address [address], [place], [place] [address]

## 2014-06-15 NOTE — Progress Notes (Signed)
Patient ID: Anne Anthony, female   DOB: [birth year], 78 y.o.   MRN: [id number]      Reason for office visit S/P ER visit for dyspnea, atrial fibrillation, pacemaker follow up  Anne Anthony was seen in the emergency room on July 31 with complaints of interscapular pain and dyspnea for several weeks. The note states that the pain was associated with food, but that it could sometimes have an exertional component. The radiological studies did not show evidence of congestive heart failure but the BNP was mildly elevated. She was given a prescription for furosemide for 3 days and has questionable improvement.    The most striking abnormality that was identified was a very large hiatal hernia with possible gastric volvulus. This was seen on a CT angiogram of the chest that did not show evidence of pulmonary embolism. Anne Anthony has had 2 previous attempts at surgical repair of a large diaphragmatic hernia, she has been told that future surgeries would be impossible/unsuccessful. Empirical treatment for possible urinary tract infection was also provided, but the urine culture was normal.  Echocardiography performed roughly one year ago showed a left ventricular ejection fraction of 55-60% and normal diastolic function with very mild valvular abnormalities. There was no evidence of elevated filling pressures.  She was initially diagnosed with atrial flutter and had successful radiofrequency caval tricuspid isthmus ablation, but several years later returned with paroxysmal atrial fibrillation and required a dual-chamber permanent pacemaker for tachycardia-bradycardia syndrome (Medtronic, implanted August 2012).  Interrogation of her pacemaker today shows normal device function. She has approximately 84% atrial pacing and less than 3% ventricular pacing. Heart rate histogram is excellent suggesting that there is good treatment for chronotropic incompetence. She has had a low prevalence of atrial  fibrillation with an overall burden of 0.2%. Most of the episodes are measured in a few seconds or minutes, with 1 episode on July 16 that lasted for over 2 hours. Ventricular rate control is slightly subpar with an average ventricular rate of 115 bpm during the longer episodes. Of note there is no connection between the recent episodes of atrial fibrillation and her symptoms. No atrial fibrillation had occurred during the 2 weeks preceding her emergency room visit.     No Known Allergies  Current Outpatient Prescriptions  Medication Sig Dispense Refill  . atenolol (TENORMIN) 50 MG tablet Take 25 mg by mouth daily.      Marland Kitchen Bioflavonoid Products (ESTER C PO) Take 1 tablet by mouth daily.       . Calcium Carb-Cholecalciferol (CALCIUM 600 + D PO) Take 1 tablet by mouth daily.       . ciprofloxacin (CIPRO) 500 MG tablet Take 1 tablet (500 mg total) by mouth 2 (two) times daily. One po bid x 7 days  14 tablet  0  . dexlansoprazole (DEXILANT) 60 MG capsule Take 1 capsule (60 mg total) by mouth daily.  30 capsule  11  . fish oil-omega-3 fatty acids 1000 MG capsule Take 1 g by mouth daily.      . furosemide (LASIX) 20 MG tablet Take 1 tablet (20 mg total) by mouth daily.  2 tablet  0  . gabapentin (NEURONTIN) 300 MG capsule Take 300 mg by mouth 2 (two) times daily.      Marland Kitchen HYDROcodone-acetaminophen (NORCO/VICODIN) 5-325 MG per tablet Take 1 tablet by mouth every 6 (six) hours as needed for moderate pain or severe pain.  15 tablet  0  . lisinopril (PRINIVIL,ZESTRIL) 20 MG tablet Take  20 mg by mouth daily.      . simvastatin (ZOCOR) 20 MG tablet Take 20 mg by mouth every evening.      . warfarin (COUMADIN) 5 MG tablet Take 2.5-5 mg by mouth daily. Takes 1/2 tablet daily except for 5mg  every Tuesday       No current facility-administered medications for this visit.    Past Medical History  Diagnosis Date  . Atrial flutter     successful radiofrequency ablation 2007  . Paroxysmal atrial fibrillation     . Tachycardia-bradycardia syndrome     permament pacermaker, dual chamber Medtronic on 06/2011  . Hypertension   . Hyperlipidemia   . Pacemaker   . Tubular adenoma   . Hiatal hernia   . Diverticula of colon   . Family history of colon cancer     brother    Past Surgical History  Procedure Laterality Date  . Permament pacemaker  06/2011    dual chamber , Medtronic Adapta serial # [id number]  last checked 02/05/2013  . Hiatal hernia repair  2005  . Radiofrequency ablation  06/28/2006  . Colonoscopy  08/17/2010    Dr. Gala Romney- internal hemorrhoids, o/w normal rectum, L sided diverticula, tubular adenoma  . Esophagogastroduodenoscopy  10/502011    Dr. Gala Romney- huge diaphragmatic/hiatial hernia, fundal gland polyps not manipulated o/w noral appearing gastric mucosa, patent pylorus, normal D1 & D2  . Partial hysterectomy      Family History  Problem Relation Age of Onset  . Diabetes Mother     History   Social History  . Marital Status: Widowed    Spouse Name: [name]    Number of Children: 4  . Years of Education: [name]   Occupational History  . Not on file.   Social History Main Topics  . Smoking status: Never Smoker   . Smokeless tobacco: Not on file  . Alcohol Use: No  . Drug Use: No  . Sexual Activity: Not on file   Other Topics Concern  . Not on file   Social History Narrative  . No narrative on file    Review of systems: Dyspnea at rest, often after eating associated with chest discomfort that radiates between the scapulae. She is unaware of palpitations and denies dizziness or syncope. She does not have exertional chest pain on my interview. His problems with acid reflux and early satiety. Shee denies lower extremity edema. She has not had serious bleeding problems or focal neurological events. She has had a small right subconjunctival hemorrhage. Denies mood problems, major weight changes or recent allergic problem  PHYSICAL EXAM BP 110/60  Pulse 68  Resp 16  Ht  5\' 3"  (1.6 m)  Wt 117 lb 4.8 oz (53.207 kg)  BMI 20.78 kg/m2  General: Alert, oriented x3, no distress Head: no evidence of trauma, PERRL, EOMI, no exophtalmos or lid lag, no myxedema, no xanthelasma; normal ears, nose and oropharynx Neck: normal jugular venous pulsations and no hepatojugular reflux; brisk carotid pulses without delay and no carotid bruits Chest: clear to auscultation, no signs of consolidation by percussion or palpation, normal fremitus, symmetrical and full respiratory excursions Cardiovascular: normal position and quality of the apical impulse, regular rhythm, normal first and second heart sounds, no murmurs, rubs or gallops Abdomen: no tenderness or distention, no masses by palpation, no abnormal pulsatility or arterial bruits, normal bowel sounds, no hepatosplenomegaly Extremities: no clubbing, cyanosis or edema; 2+ radial, ulnar and brachial pulses bilaterally; 2+ right femoral, posterior tibial and dorsalis  pedis pulses; 2+ left femoral, posterior tibial and dorsalis pedis pulses; no subclavian or femoral bruits Neurological: grossly nonfocal   EKG: Paced ventricular sense nonspecific ST segment changes unchanged from previous tracings QTC 438 ms  Lipid Panel  No results found for this basename: chol, trig, hdl, cholhdl, vldl, ldlcalc    BMET    Component Value Date/Time   NA 141 06/12/2014 1710   K 4.7 06/12/2014 1710   CL 101 06/12/2014 1710   CO2 26 06/12/2014 1710   GLUCOSE 117* 06/12/2014 1710   BUN 18 06/12/2014 1710   CREATININE 1.10 06/12/2014 1710   CALCIUM 10.0 06/12/2014 1710   GFRNONAA 48* 06/12/2014 1710   GFRAA 55* 06/12/2014 1710     ASSESSMENT AND PLAN  In my judgment, it is unlikely that Anne Anthony's symptoms are related to her heart and is much more likely that her chest discomfort and knee are related to extremely large hiatal hernia that occupies virtually the entire bottom thirds to one half of her thoracic cavity. On the CT scan one sees  the body of the stomach interposed between the esophagus and the heart, containing large quantities of fluid and food remnants.  There is no radiological evidence of fluid overload. The only abnormality that points towards possible congestive heart failure the elevated BNP, which can be falsely elevated in older women.   It is not unreasonable to repeat an echocardiogram to confirm that her new complaints are not related to a change in cardiac status. I don't think invasive procedures either solution.  Although ventricular rate control is not perfect, the overall burden of atrial fibrillation is very low and there is no connection between her symptoms and the arrhythmic events. I don't think it is necessary to make changes to her cardiac medications. I am skeptical that long-term diuretic therapy would provide any benefit.  She has been told that further surgery for diaphragmatic hernia with the unlikely to help or impossible (one surgeon told her that her esophagus is too short to allow retraction of the stomach into her abdomen). I wonder if there are certain dietary recommendations or medications that could accelerate gastric emptying, which might help her complaints  Orders Placed This Encounter  Procedures  . EKG 12-Lead  . 2D Echocardiogram without contrast   No orders of the defined types were placed in this encounter.    Holli Humbles, MD, [place] [phone] office [phone] pager

## 2014-06-15 NOTE — Patient Instructions (Addendum)
Your physician has requested that you have an echocardiogram. Echocardiography is a painless test that uses sound waves to create images of your heart. It provides your doctor with information about the size and shape of your heart and how well your heart's chambers and valves are working. This procedure takes approximately one hour. There are no restrictions for this procedure.  Follow-up with the [facility] in 6 months - February 2016.  Dr. Sallyanne Kuster recommends that you schedule a follow-up appointment in: Yearly.

## 2014-06-15 NOTE — Telephone Encounter (Signed)
Information mailed to the pt.

## 2014-06-16 ENCOUNTER — Ambulatory Visit: Payer: Medicare Other | Attending: Neurology | Admitting: Physical Therapy

## 2014-06-16 DIAGNOSIS — I10 Essential (primary) hypertension: Secondary | ICD-10-CM | POA: Diagnosis not present

## 2014-06-16 DIAGNOSIS — M545 Low back pain, unspecified: Secondary | ICD-10-CM | POA: Insufficient documentation

## 2014-06-16 DIAGNOSIS — M5430 Sciatica, unspecified side: Secondary | ICD-10-CM | POA: Diagnosis not present

## 2014-06-16 DIAGNOSIS — R293 Abnormal posture: Secondary | ICD-10-CM | POA: Diagnosis not present

## 2014-06-16 DIAGNOSIS — R5381 Other malaise: Secondary | ICD-10-CM | POA: Diagnosis not present

## 2014-06-16 DIAGNOSIS — Z950 Presence of cardiac pacemaker: Secondary | ICD-10-CM | POA: Diagnosis not present

## 2014-06-16 DIAGNOSIS — IMO0001 Reserved for inherently not codable concepts without codable children: Secondary | ICD-10-CM | POA: Diagnosis present

## 2014-06-17 ENCOUNTER — Ambulatory Visit: Payer: Medicare Other | Admitting: Physical Therapy

## 2014-06-17 ENCOUNTER — Telehealth: Payer: Self-pay

## 2014-06-17 DIAGNOSIS — IMO0001 Reserved for inherently not codable concepts without codable children: Secondary | ICD-10-CM | POA: Diagnosis not present

## 2014-06-17 NOTE — Telephone Encounter (Signed)
It looks like she only wants to see RMR. If she is willing to see me or Magda Paganini, can put her in an urgent spot.

## 2014-06-17 NOTE — Telephone Encounter (Signed)
Pt's daughter is calling to see if we can move her appointment closer then Aug 25th. I told her RMR did not have anything sooner. All we have are urgent spots with the NP/PA. They have had to call [phone] twice for her. The hospital keeps telling her that it is something with her stomach.Please advise if you would like to bring her in sooner.

## 2014-06-19 NOTE — Progress Notes (Signed)
Pacemaker check in clinic. Normal device function. Thresholds, sensing, impedances consistent with previous measurements. Device programmed to maximize longevity. 355 mode switch episodes (0.2%)---max dur. 2 hrs 15 mins, Max A >400, Max V 208 + Warfarin. 10 high ventricular rates noted---max dur. 33 sec, Max V 197. Device programmed at appropriate safety margins. Histogram distribution appropriate for patient activity level. Device programmed to optimize intrinsic conduction. Estimated longevity 11.5 years. Patient will follow up with the [facility] in 6 months and with [facility] in 12 months.

## 2014-06-23 ENCOUNTER — Ambulatory Visit (HOSPITAL_COMMUNITY)
Admission: RE | Admit: 2014-06-23 | Discharge: 2014-06-23 | Disposition: A | Discharge status: Home / Self Care | Payer: Medicare Other | Source: Ambulatory Visit | Attending: Cardiology | Admitting: Cardiology

## 2014-06-23 DIAGNOSIS — R0989 Other specified symptoms and signs involving the circulatory and respiratory systems: Secondary | ICD-10-CM | POA: Diagnosis not present

## 2014-06-23 DIAGNOSIS — R0602 Shortness of breath: Secondary | ICD-10-CM

## 2014-06-23 DIAGNOSIS — I369 Nonrheumatic tricuspid valve disorder, unspecified: Secondary | ICD-10-CM

## 2014-06-23 DIAGNOSIS — R0609 Other forms of dyspnea: Secondary | ICD-10-CM | POA: Insufficient documentation

## 2014-06-23 NOTE — Telephone Encounter (Signed)
Pt only was RMR. She will wait for her appointment with him

## 2014-06-23 NOTE — Progress Notes (Signed)
2D Echocardiogram Complete.  06/23/2014   Anne Anthony, [place]

## 2014-06-24 ENCOUNTER — Ambulatory Visit: Payer: Medicare Other | Admitting: Physical Therapy

## 2014-06-24 DIAGNOSIS — IMO0001 Reserved for inherently not codable concepts without codable children: Secondary | ICD-10-CM | POA: Diagnosis not present

## 2014-06-29 ENCOUNTER — Ambulatory Visit (INDEPENDENT_AMBULATORY_CARE_PROVIDER_SITE_OTHER): Payer: Medicare Other | Admitting: Pharmacist Clinician (PhC)/ Clinical Pharmacy Specialist

## 2014-06-29 DIAGNOSIS — I4891 Unspecified atrial fibrillation: Secondary | ICD-10-CM

## 2014-06-29 DIAGNOSIS — Z7901 Long term (current) use of anticoagulants: Secondary | ICD-10-CM

## 2014-06-29 LAB — POCT INR: INR: 2.1

## 2014-07-01 ENCOUNTER — Ambulatory Visit: Payer: Medicare Other | Admitting: Physical Therapy

## 2014-07-01 DIAGNOSIS — IMO0001 Reserved for inherently not codable concepts without codable children: Secondary | ICD-10-CM | POA: Diagnosis not present

## 2014-07-07 ENCOUNTER — Encounter: Payer: Self-pay | Admitting: Internal Medicine

## 2014-07-07 ENCOUNTER — Ambulatory Visit (INDEPENDENT_AMBULATORY_CARE_PROVIDER_SITE_OTHER): Payer: Medicare Other | Admitting: Internal Medicine

## 2014-07-07 ENCOUNTER — Ambulatory Visit: Payer: Medicare Other | Admitting: Internal Medicine

## 2014-07-07 VITALS — BP 105/64 | HR 79 | Temp 97.9°F | Ht 64.0 in | Wt 117.4 lb

## 2014-07-07 DIAGNOSIS — K219 Gastro-esophageal reflux disease without esophagitis: Secondary | ICD-10-CM

## 2014-07-07 DIAGNOSIS — K3189 Other diseases of stomach and duodenum: Secondary | ICD-10-CM

## 2014-07-07 DIAGNOSIS — K319 Disease of stomach and duodenum, unspecified: Secondary | ICD-10-CM

## 2014-07-07 DIAGNOSIS — K449 Diaphragmatic hernia without obstruction or gangrene: Secondary | ICD-10-CM

## 2014-07-07 NOTE — Patient Instructions (Signed)
   Continue Dexilant 60 mg daily  Gastroparesis diet  I will make arrangements to refer you back to a hiatal hernia surgery expert at [facility]

## 2014-07-07 NOTE — Progress Notes (Signed)
Primary Care Physician:  Purvis Kilts, MD Primary Gastroenterologist:  Dr. Gala Romney  Pre-Procedure History & Physical: HPI:  Anne Anthony is a 78 y.o. female here for further evaluation of recurrent chest/back pain felt to be secondary to a huge hiatal hernia with organoaxial gastric rotation/chronic gastric volvulus. This lady has had long-standing issues with this problem. There was an attempt at repair over at [facility]  several years ago. Surgery had to be aborted because of cardiac instability. She subsequently had RFA ablation for atrial fibrillation. She's had a pacemaker placed. More recently, Dr. Alphonsa Overall attempted laparoscopic repair. He found the esophagus to be too short to complete definitive surgery by family report (I cannot locate those records in [facility] at this time)   She remains on Coumadin. She has symptoms of chronic early satiety and intermittent retrosternal chest pain. She's had a thorough cardiac evaluation her symptoms not felt to be cardiac in etiology.  She does have a typical GERD symptoms which are well controlled on Dexilant 60 mg daily. Her weight has dropped further from 124 back in April of this year down to 117 today. Patient reports getting full after just a couple of mouthfuls  Past Medical History  Diagnosis Date  . Atrial flutter     successful radiofrequency ablation 2007  . Paroxysmal atrial fibrillation   . Tachycardia-bradycardia syndrome     permament pacermaker, dual chamber Medtronic on 06/2011  . Hypertension   . Hyperlipidemia   . Pacemaker   . Tubular adenoma   . Hiatal hernia   . Diverticula of colon   . Family history of colon cancer     brother    Past Surgical History  Procedure Laterality Date  . Permament pacemaker  06/2011    dual chamber , Medtronic Adapta serial # [id number]  last checked 02/05/2013  . Hiatal hernia repair  2005  . Radiofrequency ablation  06/28/2006  . Colonoscopy  08/17/2010    Dr. Gala Romney- internal  hemorrhoids, o/w normal rectum, L sided diverticula, tubular adenoma  . Esophagogastroduodenoscopy  10/502011    Dr. Gala Romney- huge diaphragmatic/hiatial hernia, fundal gland polyps not manipulated o/w noral appearing gastric mucosa, patent pylorus, normal D1 & D2  . Partial hysterectomy      Prior to Admission medications   Medication Sig Start Date End Date Taking? Authorizing Provider  atenolol (TENORMIN) 50 MG tablet Take 25 mg by mouth daily.   Yes Historical Provider, MD  Bioflavonoid Products (ESTER C PO) Take 1 tablet by mouth daily.    Yes Historical Provider, MD  Calcium Carb-Cholecalciferol (CALCIUM 600 + D PO) Take 1 tablet by mouth daily.    Yes Historical Provider, MD  ciprofloxacin (CIPRO) 500 MG tablet Take 1 tablet (500 mg total) by mouth 2 (two) times daily. One po bid x 7 days 06/12/14  Yes Wandra Arthurs, MD  dexlansoprazole (DEXILANT) 60 MG capsule Take 1 capsule (60 mg total) by mouth daily. 04/07/14  Yes Daneil Dolin, MD  fish oil-omega-3 fatty acids 1000 MG capsule Take 1 g by mouth daily.   Yes Historical Provider, MD  furosemide (LASIX) 20 MG tablet Take 1 tablet (20 mg total) by mouth daily. 06/12/14  Yes Wandra Arthurs, MD  gabapentin (NEURONTIN) 300 MG capsule Take 300 mg by mouth 2 (two) times daily.   Yes Historical Provider, MD  HYDROcodone-acetaminophen (NORCO/VICODIN) 5-325 MG per tablet Take 1 tablet by mouth every 6 (six) hours as needed for moderate pain  or severe pain. 05/23/14  Yes Montine Circle, PA-C  lisinopril (PRINIVIL,ZESTRIL) 20 MG tablet Take 20 mg by mouth daily.   Yes Historical Provider, MD  simvastatin (ZOCOR) 20 MG tablet Take 20 mg by mouth every evening.   Yes Historical Provider, MD  warfarin (COUMADIN) 5 MG tablet Take 2.5-5 mg by mouth daily. Takes 1/2 tablet daily except for 5mg  every Tuesday   Yes Historical Provider, MD    Allergies as of 07/07/2014  . (No Known Allergies)    Family History  Problem Relation Age of Onset  . Diabetes Mother      History   Social History  . Marital Status: Widowed    Spouse Name: [name]    Number of Children: 4  . Years of Education: [name]   Occupational History  . Not on file.   Social History Main Topics  . Smoking status: Never Smoker   . Smokeless tobacco: Not on file  . Alcohol Use: No  . Drug Use: No  . Sexual Activity: Not on file   Other Topics Concern  . Not on file   Social History Narrative  . No narrative on file    Review of Systems: See HPI, otherwise negative ROS  Physical Exam: BP 105/64  Pulse 79  Temp(Src) 97.9 F (36.6 C) (Oral)  Ht 5\' 4"  (1.626 m)  Wt 117 lb 6.4 oz (53.252 kg)  BMI 20.14 kg/m2 General:   Alert,  pleasant and cooperative elderly lady in NAD Skin:  Intact without significant lesions or rashes. Eyes:  Sclera clear, no icterus.   Conjunctiva pink. Ears:  Normal auditory acuity. Nose:  No deformity, discharge,  or lesions. Mouth:  No deformity or lesions. Neck:  Supple; no masses or thyromegaly. No significant cervical adenopathy. Lungs:  Clear throughout to auscultation.   No wheezes, crackles, or rhonchi. No acute distress. Heart:  Regular rate and rhythm; no murmurs, clicks, rubs,  or gallops. Abdomen: Non-distended, normal bowel sounds.  Soft and nontender without appreciable mass or hepatosplenomegaly.  Pulses:  Normal pulses noted. Extremities:  Without clubbing or edema.  Impression: Very pleasant 78 year old lady with a huge hiatal hernia and organoaxial volvulus. I suspect her chest and back symptoms are related to this entity. Attempts at surgical repair previously aborted secondary to cardiac instability. Early satiety and weight loss are likely related symptoms.  Ideally, she needs a surgical repair. Her cardiac status has improved since her last attempt at surgical repair at [facility]. She likely does have a shortened esophagus. I wonder she would be a candidate for a Collis lengthening procedure.  Dietary modification and continued  acid suppression therapy will likely provide partial, and overall inadequate, control her symptoms. I feel prokinetic therapy would be contraindicated in this setting.   Recommendations:   I told her and her daughter that it would be worthwhile for her to go back over to Dr. Abran Richard to see if another attempt at surgical repair be feasible We will facilitate that referral.  In the interim, continue Dexilant 60 mg daily and a gastroparesis diet      Notice: This dictation was prepared with Dragon dictation along with smaller phrase technology. Any transcriptional errors that result from this process are unintentional and may not be corrected upon review.

## 2014-07-08 ENCOUNTER — Encounter: Payer: Medicare Other | Admitting: Physical Therapy

## 2014-07-08 NOTE — Progress Notes (Signed)
Patient is schedule with Dr. Judeth Cornfield at [facility] on Wednesday September 23rd at 9:00 am daughter is aware

## 2014-07-09 ENCOUNTER — Ambulatory Visit (INDEPENDENT_AMBULATORY_CARE_PROVIDER_SITE_OTHER): Payer: Medicare Other | Admitting: Neurology

## 2014-07-09 ENCOUNTER — Ambulatory Visit: Payer: Medicare Other | Admitting: Cardiovascular Disease

## 2014-07-09 ENCOUNTER — Encounter: Payer: Self-pay | Admitting: Neurology

## 2014-07-09 VITALS — BP 110/64 | HR 86 | Ht 63.5 in | Wt 118.0 lb

## 2014-07-09 DIAGNOSIS — M5430 Sciatica, unspecified side: Secondary | ICD-10-CM

## 2014-07-09 DIAGNOSIS — M5442 Lumbago with sciatica, left side: Secondary | ICD-10-CM

## 2014-07-09 MED ORDER — GABAPENTIN 300 MG PO CAPS: ORAL_CAPSULE | ORAL | Status: DC

## 2014-07-09 NOTE — Progress Notes (Signed)
Done

## 2014-07-09 NOTE — Patient Instructions (Signed)
1. Continue gabapentin 300mg  at bedtime 2. Continue physical therapy exercises at home 3. Follow-up in 6 months

## 2014-07-09 NOTE — Progress Notes (Signed)
NEUROLOGY FOLLOW UP OFFICE NOTE  ELYSABETH Anthony [id number]  HISTORY OF PRESENT ILLNESS: I had the pleasure of seeing Anne Anthony in follow-up in the neurology clinic on 07/09/2014.  The patient was last seen 6 weeks ago for severe left buttock pain radiating behind her left leg to the ankle. CT lumbar spine showed multilevel spondylosis with right L2 or L3 possible nerve root impingement, left greater than right poetntial L4 nerve root impingement, left greater than right L5 nerve root impingement.  She underwent PT and states that the pain in her left buttock and leg is much better. She only takes the gabapentin 300mg  at bedtime if there is pain, no side effects.  She denies any focal numbness/tingling/weakness.  No bowel/bladder incontinence.  She has been evaluated for chest/upper back pain in the ER, by Cardiology and GI, symptoms felt to be due to organoaxial gastric rotation/chronic gastric volvulus. She is scheduled to see a hiatal hernia surgeon at [facility] next month.  HPI:  This is [name] with a history of paroxysmal atrial fibrillation, tachy-brady syndrome s/p pacemaker on chronic anticoagulation with Coumadin, hypertension, hyperlipidemia, who presented for severe left buttock pain radiating behind the left leg down to her ankle. Symptoms started in April 2015 when she woke up in severe pain unable to walk. She was brought to [facility] ER where xrays of the left femur and pelvis were done, showing degenerative changes without acute abnormalities. She was given a prescription for Prednisone and Tramadol, unclear if the patient took this, she reports taking Tylenol twice a day with no relief. The pain is worse when lying down and bending down. She reports that she has always had trouble with her back that is usually helped by chiropractor visits, however recently this has not helped. She also has always had trouble with both legs, but more with the left leg recently. She has  intermittent numbness in the soles and toes of both feet for several years. She denies any bowel/bladder involvement, no perineal numbness. She denies any neck pain or arm involvement. She denies any recent falls or heavy lifting. She denies prior similar symptoms. She denies any headaches, dizziness, diplopia, dysarthria, dysphagia. She was started on Flexeril 10mg  TID last week but this has not helped. She had seen her chiropractor last week and tells me an xray done of the spine had shown arthritis changes.   PAST MEDICAL HISTORY: Past Medical History  Diagnosis Date  . Atrial flutter     successful radiofrequency ablation 2007  . Paroxysmal atrial fibrillation   . Tachycardia-bradycardia syndrome     permament pacermaker, dual chamber Medtronic on 06/2011  . Hypertension   . Hyperlipidemia   . Pacemaker   . Tubular adenoma   . Hiatal hernia   . Diverticula of colon   . Family history of colon cancer     brother    MEDICATIONS: Current Outpatient Prescriptions on File Prior to Visit  Medication Sig Dispense Refill  . atenolol (TENORMIN) 50 MG tablet Take 25 mg by mouth daily.      Marland Kitchen Bioflavonoid Products (ESTER C PO) Take 1 tablet by mouth daily.       . Calcium Carb-Cholecalciferol (CALCIUM 600 + D PO) Take 1 tablet by mouth daily.       Marland Kitchen dexlansoprazole (DEXILANT) 60 MG capsule Take 1 capsule (60 mg total) by mouth daily.  30 capsule  11  . fish oil-omega-3 fatty acids 1000 MG capsule  Take 1 g by mouth daily.      . furosemide (LASIX) 20 MG tablet Take 1 tablet (20 mg total) by mouth daily.  2 tablet  0  . lisinopril (PRINIVIL,ZESTRIL) 20 MG tablet Take 20 mg by mouth daily.      . simvastatin (ZOCOR) 20 MG tablet Take 20 mg by mouth every evening.      . warfarin (COUMADIN) 5 MG tablet Take 2.5-5 mg by mouth daily. Takes 1/2 tablet daily except for 5mg  every Tuesday      . gabapentin (NEURONTIN) 300 MG capsule Take 300 mg by mouth 2 (two) times daily.      Marland Kitchen  HYDROcodone-acetaminophen (NORCO/VICODIN) 5-325 MG per tablet Take 1 tablet by mouth every 6 (six) hours as needed for moderate pain or severe pain.  15 tablet  0   No current facility-administered medications on file prior to visit.    ALLERGIES: No Known Allergies  FAMILY HISTORY: Family History  Problem Relation Age of Onset  . Diabetes Mother     SOCIAL HISTORY: History   Social History  . Marital Status: Widowed    Spouse Name: [name]    Number of Children: 4  . Years of Education: [name]   Occupational History  . Not on file.   Social History Main Topics  . Smoking status: Never Smoker   . Smokeless tobacco: Not on file  . Alcohol Use: No  . Drug Use: No  . Sexual Activity: Not on file   Other Topics Concern  . Not on file   Social History Narrative  . No narrative on file    REVIEW OF SYSTEMS: Constitutional: No fevers, chills, or sweats, no generalized fatigue, change in appetite Eyes: No visual changes, double vision, eye pain Ear, nose and throat: No hearing loss, ear pain, nasal congestion, sore throat Cardiovascular: No chest pain, palpitations Respiratory:  No shortness of breath at rest or with exertion, wheezes GastrointestinaI: No nausea, vomiting, diarrhea, abdominal pain, fecal incontinence Genitourinary:  No dysuria, urinary retention or frequency Musculoskeletal:  No neck pain, back pain Integumentary: No rash, pruritus, skin lesions Neurological: as above Psychiatric: No depression, insomnia, anxiety Endocrine: No palpitations, fatigue, diaphoresis, mood swings, change in appetite, change in weight, increased thirst Hematologic/Lymphatic:  No anemia, purpura, petechiae. Allergic/Immunologic: no itchy/runny eyes, nasal congestion, recent allergic reactions, rashes  PHYSICAL EXAM: Filed Vitals:   07/09/14 1137  BP: 110/64  Pulse: 86   General: No acute distress Head:  Normocephalic/atraumatic Neck: supple, no paraspinal tenderness, full  range of motion Heart:  Regular rate and rhythm Lungs:  Clear to auscultation bilaterally Back: No paraspinal tenderness Skin/Extremities: No rash, no edema Neurological Exam: alert and oriented to person, place, and time. No aphasia or dysarthria. Fund of knowledge is appropriate.  Recent and remote memory are intact.  Attention and concentration are normal.    Able to name objects and repeat phrases. Cranial nerves: Pupils equal, round, reactive to light.  Fundoscopic exam unremarkable, no papilledema. Extraocular movements intact with no nystagmus. Visual fields full. Facial sensation intact. No facial asymmetry. Tongue, uvula, palate midline.  Motor: Bulk and tone normal, muscle strength 5/5 throughout with no pronator drift.  Sensation to light touch.  No extinction to double simultaneous stimulation.  Deep tendon reflexes brisk 2+ throughout, toes downgoing.  Finger to nose testing intact.  Gait narrow-based and steady, able to tandem walk adequately.  Romberg negative.  IMPRESSION: This is a pleasant 78 yo RH woman with a history  of paroxysmal atrial fibrillation, tachy-brady syndrome s/p pacemaker on chronic anticoagulation with Coumadin, who presented with symptoms suggestive of  left-sided sciatica with left buttock pain radiating behind the left leg. Her CT lumbar spine shows multilevel spondylosis with nerve impingement L>R at L4, 5.  She has had good response to physical therapy and states pain is much improved.  She will continue to take prn gabapentin 300mg  at bedtime for now.  She will follow-up in 6 months or earlier if needed.  Thank you for allowing me to participate in her care.  Please do not hesitate to call for any questions or concerns.  The duration of this appointment visit was 15 minutes of face-to-face time with the patient.  Greater than 50% of this time was spent in counseling, explanation of diagnosis, planning of further management, and coordination of care.   Anne Anthony, M.D.   CC: Dr. Hilma Favors

## 2014-07-10 ENCOUNTER — Ambulatory Visit: Payer: Medicare Other | Admitting: Physical Therapy

## 2014-07-10 DIAGNOSIS — IMO0001 Reserved for inherently not codable concepts without codable children: Secondary | ICD-10-CM | POA: Diagnosis not present

## 2014-07-15 ENCOUNTER — Ambulatory Visit: Payer: Medicare Other | Attending: Neurology | Admitting: *Deleted

## 2014-07-15 DIAGNOSIS — M5430 Sciatica, unspecified side: Secondary | ICD-10-CM | POA: Diagnosis not present

## 2014-07-15 DIAGNOSIS — IMO0001 Reserved for inherently not codable concepts without codable children: Secondary | ICD-10-CM | POA: Diagnosis present

## 2014-07-15 DIAGNOSIS — R293 Abnormal posture: Secondary | ICD-10-CM | POA: Insufficient documentation

## 2014-07-15 DIAGNOSIS — M545 Low back pain, unspecified: Secondary | ICD-10-CM | POA: Diagnosis not present

## 2014-07-15 DIAGNOSIS — I10 Essential (primary) hypertension: Secondary | ICD-10-CM | POA: Diagnosis not present

## 2014-07-15 DIAGNOSIS — R5381 Other malaise: Secondary | ICD-10-CM | POA: Diagnosis not present

## 2014-07-15 DIAGNOSIS — Z950 Presence of cardiac pacemaker: Secondary | ICD-10-CM | POA: Diagnosis not present

## 2014-07-21 ENCOUNTER — Encounter: Payer: Medicare Other | Admitting: Cardiovascular Disease

## 2014-07-22 ENCOUNTER — Ambulatory Visit: Payer: Medicare Other | Admitting: Physical Therapy

## 2014-07-22 DIAGNOSIS — IMO0001 Reserved for inherently not codable concepts without codable children: Secondary | ICD-10-CM | POA: Diagnosis not present

## 2014-07-27 ENCOUNTER — Ambulatory Visit (INDEPENDENT_AMBULATORY_CARE_PROVIDER_SITE_OTHER): Payer: Medicare Other | Admitting: Pharmacist Clinician (PhC)/ Clinical Pharmacy Specialist

## 2014-07-27 DIAGNOSIS — Z7901 Long term (current) use of anticoagulants: Secondary | ICD-10-CM

## 2014-07-27 DIAGNOSIS — I4891 Unspecified atrial fibrillation: Secondary | ICD-10-CM

## 2014-07-27 LAB — POCT INR: INR: 1.9

## 2014-07-29 ENCOUNTER — Ambulatory Visit: Payer: Medicare Other | Admitting: Physical Therapy

## 2014-07-29 DIAGNOSIS — IMO0001 Reserved for inherently not codable concepts without codable children: Secondary | ICD-10-CM | POA: Diagnosis not present

## 2014-08-12 ENCOUNTER — Encounter: Payer: Self-pay | Admitting: Cardiovascular Disease

## 2014-08-17 ENCOUNTER — Telehealth: Payer: Self-pay | Admitting: Neurology

## 2014-08-17 DIAGNOSIS — M5442 Lumbago with sciatica, left side: Secondary | ICD-10-CM

## 2014-08-17 NOTE — Telephone Encounter (Signed)
Pt called would like referral back to PT, she says her back is hurting again. CB# [phone] / Sherri S.

## 2014-08-17 NOTE — Telephone Encounter (Signed)
Yes please, thanks

## 2014-08-17 NOTE — Telephone Encounter (Signed)
Spoke with her. She has been through therapy and was d/c'd with HEP. She states she feels better today, but had a bad weekend. Do you want to refer her to ortho?

## 2014-08-20 ENCOUNTER — Encounter (HOSPITAL_COMMUNITY): Payer: Self-pay | Admitting: Emergency Medicine

## 2014-08-20 ENCOUNTER — Emergency Department (HOSPITAL_COMMUNITY)
Admission: EM | Admit: 2014-08-20 | Discharge: 2014-08-20 | Disposition: A | Discharge status: Home / Self Care | Payer: Medicare Other | Attending: Emergency Medicine | Admitting: Emergency Medicine

## 2014-08-20 DIAGNOSIS — Z85038 Personal history of other malignant neoplasm of large intestine: Secondary | ICD-10-CM | POA: Insufficient documentation

## 2014-08-20 DIAGNOSIS — Z8719 Personal history of other diseases of the digestive system: Secondary | ICD-10-CM | POA: Insufficient documentation

## 2014-08-20 DIAGNOSIS — M25552 Pain in left hip: Secondary | ICD-10-CM | POA: Diagnosis present

## 2014-08-20 DIAGNOSIS — Z950 Presence of cardiac pacemaker: Secondary | ICD-10-CM | POA: Insufficient documentation

## 2014-08-20 DIAGNOSIS — M5432 Sciatica, left side: Secondary | ICD-10-CM

## 2014-08-20 DIAGNOSIS — E785 Hyperlipidemia, unspecified: Secondary | ICD-10-CM | POA: Diagnosis not present

## 2014-08-20 DIAGNOSIS — Z79899 Other long term (current) drug therapy: Secondary | ICD-10-CM | POA: Insufficient documentation

## 2014-08-20 DIAGNOSIS — Z7901 Long term (current) use of anticoagulants: Secondary | ICD-10-CM | POA: Insufficient documentation

## 2014-08-20 DIAGNOSIS — I10 Essential (primary) hypertension: Secondary | ICD-10-CM | POA: Diagnosis not present

## 2014-08-20 DIAGNOSIS — I480 Paroxysmal atrial fibrillation: Secondary | ICD-10-CM | POA: Insufficient documentation

## 2014-08-20 LAB — CBC WITH DIFFERENTIAL/PLATELET
BASOS ABS: 0 10*3/uL (ref 0.0–0.1)
Basophils Relative: 1 % (ref 0–1)
EOS ABS: 0.1 10*3/uL (ref 0.0–0.7)
Eosinophils Relative: 1 % (ref 0–5)
HEMATOCRIT: 39.4 % (ref 36.0–46.0)
Hemoglobin: 13.3 g/dL (ref 12.0–15.0)
Lymphocytes Relative: 22 % (ref 12–46)
Lymphs Abs: 1.7 10*3/uL (ref 0.7–4.0)
MCH: 33.1 pg (ref 26.0–34.0)
MCHC: 33.8 g/dL (ref 30.0–36.0)
MCV: 98 fL (ref 78.0–100.0)
MONO ABS: 0.8 10*3/uL (ref 0.1–1.0)
Monocytes Relative: 10 % (ref 3–12)
Neutro Abs: 5 10*3/uL (ref 1.7–7.7)
Neutrophils Relative %: 66 % (ref 43–77)
Platelets: 231 10*3/uL (ref 150–400)
RBC: 4.02 MIL/uL (ref 3.87–5.11)
RDW: 12.6 % (ref 11.5–15.5)
WBC: 7.7 10*3/uL (ref 4.0–10.5)

## 2014-08-20 LAB — COMPREHENSIVE METABOLIC PANEL
ALT: 21 U/L (ref 0–35)
AST: 32 U/L (ref 0–37)
Albumin: 3.9 g/dL (ref 3.5–5.2)
Alkaline Phosphatase: 62 U/L (ref 39–117)
Anion gap: 13 (ref 5–15)
BUN: 17 mg/dL (ref 6–23)
CO2: 25 mEq/L (ref 19–32)
Calcium: 9.4 mg/dL (ref 8.4–10.5)
Chloride: 100 mEq/L (ref 96–112)
Creatinine, Ser: 1.45 mg/dL — ABNORMAL HIGH (ref 0.50–1.10)
GFR calc Af Amer: 39 mL/min — ABNORMAL LOW (ref >90)
GFR calc non Af Amer: 34 mL/min — ABNORMAL LOW (ref >90)
Glucose, Bld: 118 mg/dL — ABNORMAL HIGH (ref 70–99)
Potassium: 4.7 mEq/L (ref 3.7–5.3)
Sodium: 138 mEq/L (ref 137–147)
Total Bilirubin: 0.4 mg/dL (ref 0.3–1.2)
Total Protein: 7.3 g/dL (ref 6.0–8.3)

## 2014-08-20 LAB — ACETAMINOPHEN LEVEL: Acetaminophen (Tylenol), Serum: 26.8 ug/mL (ref 10–30)

## 2014-08-20 MED ORDER — OXYCODONE HCL 5 MG PO TABS: 5.0000 mg | ORAL_TABLET | Freq: Three times a day (TID) | ORAL | Status: DC | PRN

## 2014-08-20 MED ORDER — HYDROMORPHONE HCL 1 MG/ML IJ SOLN
1.0000 mg | Freq: Once | INTRAMUSCULAR | Status: AC
  Administered 2014-08-20: 1 mg via INTRAVENOUS
  Filled 2014-08-20: qty 1

## 2014-08-20 NOTE — ED Notes (Signed)
Patient presents from home [facility] for intermittent left sciatic nerve pain. Patient reports taking hydrocodone without relief. Reports pain 9/10. A&O x4.   VS 152/87, 80Hr, 16Resp, 98%.

## 2014-08-20 NOTE — ED Notes (Signed)
Bed: FV43 Expected date: 08/20/14 Expected time: 4:32 AM Means of arrival: Ambulance Comments: Sciatic nerve pain

## 2014-08-20 NOTE — Telephone Encounter (Signed)
Pt and her family would like to hold off on Orthopedic evaluation for now due to pt getting ready to have some abdominal surgery next month. I have cancelled appt I set up with Dr. Lynann Bologna @ [facility]. Patient would like to try therapy again before she goes in for surgery since that helped her before. I will put in another order in Epic to [facility] in [place]. They will call pt to schedule appt.

## 2014-08-20 NOTE — Discharge Instructions (Signed)
Back Exercises [name], you were seen today for sciatic nerve pain. Continue to take oxycodone at home as needed for pain. Do not take more than 3000 mg of Tylenol per day, that is less than 10 Vicodin per day. Followup with your neurologist for continued treatment. If your symptoms worsen or you develop muscle weakness, numbness or tingling, return to the emergency department immediately for repeat evaluation. Thank you. Back exercises help treat and prevent back injuries. The goal is to increase your strength in your belly (abdominal) and back muscles. These exercises can also help with flexibility. Start these exercises when told by your doctor. HOME CARE Back exercises include: Pelvic Tilt.  Lie on your back with your knees bent. Tilt your pelvis until the lower part of your back is against the floor. Hold this position 5 to 10 sec. Repeat this exercise 5 to 10 times. Knee to Chest.  Pull 1 knee up against your chest and hold for 20 to 30 seconds. Repeat this with the other knee. This may be done with the other leg straight or bent, whichever feels better. Then, pull both knees up against your chest. Sit-Ups or Curl-Ups.  Bend your knees 90 degrees. Start with tilting your pelvis, and do a partial, slow sit-up. Only lift your upper half 30 to 45 degrees off the floor. Take at least 2 to 3 seonds for each sit-up. Do not do sit-ups with your knees out straight. If partial sit-ups are difficult, simply do the above but with only tightening your belly (abdominal) muscles and holding it as told. Hip-Lift.  Lie on your back with your knees flexed 90 degrees. Push down with your feet and shoulders as you raise your hips 2 inches off the floor. Hold for 10 seconds, repeat 5 to 10 times. Back Arches.  Lie on your stomach. Prop yourself up on bent elbows. Slowly press on your hands, causing an arch in your low back. Repeat 3 to 5 times. Shoulder-Lifts.  Lie face down with arms beside your body.  Keep hips and belly pressed to floor as you slowly lift your head and shoulders off the floor. Do not overdo your exercises. Be careful in the beginning. Exercises may cause you some mild back discomfort. If the pain lasts for more than 15 minutes, stop the exercises until you see your doctor. Improvement with exercise for back problems is slow.  Document Released: 12/02/2010 Document Revised: 01/22/2012 Document Reviewed: 08/31/2011 [facility] Patient Information 2015 [place], [place]. This information is not intended to replace advice given to you by your health care provider. Make sure you discuss any questions you have with your health care provider. Sciatica Sciatica is pain, weakness, numbness, or tingling along your sciatic nerve. The nerve starts in the lower back and runs down the back of each leg. Nerve damage or certain conditions pinch or put pressure on the sciatic nerve. This causes the pain, weakness, and other discomforts of sciatica. HOME CARE  Only take medicine as told by your doctor. Apply ice to the affected area for 20 minutes. Do this 3-4 times a day for the first 48-72 hours. Then try heat in the same way. Exercise, stretch, or do your usual activities if these do not make your pain worse. Go to physical therapy as told by your doctor. Keep all doctor visits as told. Do not wear high heels or shoes that are not supportive. Get a firm mattress if your mattress is too soft to lessen pain and discomfort.  GET HELP RIGHT AWAY IF:  You cannot control when you poop (bowel movement) or pee (urinate). You have more weakness in your lower back, lower belly (pelvis), butt (buttocks), or legs. You have redness or puffiness (swelling) of your back. You have a burning feeling when you pee. You have pain that gets worse when you lie down. You have pain that wakes you from your sleep. Your pain is worse than past pain. Your pain lasts longer than 4 weeks. You are suddenly losing weight  without reason. MAKE SURE YOU:  Understand these instructions. Will watch this condition. Will get help right away if you are not doing well or get worse. Document Released: 08/08/2008 Document Revised: 04/30/2012 Document Reviewed: 03/10/2012 [facility] Patient Information 2015 [place], [place]. This information is not intended to replace advice given to you by your health care provider. Make sure you discuss any questions you have with your health care provider.

## 2014-08-20 NOTE — ED Notes (Signed)
Patient presents with left sciatic nerve pain x1-2 weeks. Pain 10/10. Patient denies injury/trauma to same. Patient denies back pain, reports tingling to same earlier yesterday morning, denies weakness.

## 2014-08-20 NOTE — ED Provider Notes (Addendum)
CSN: [id number]     Arrival date & time 08/20/14  6384 History   First MD Initiated Contact with Patient 08/20/14 0455     Chief Complaint  Patient presents with  . Hip Pain     (Consider location/radiation/quality/duration/timing/severity/associated sxs/prior Treatment) HPI Anne Anthony is a 78 y.o. female with a past medical history of hypertension hyperlipidemia, atrial fibrillation, sciatica coming in with left hip pain. Patient states this feels consistent with her normal sciatica nerve pain. She went to rehabilitation a week ago in which her pain improved. However over the last couple days it has returned despite taking Vicodin. She states she was taking it every hour yesterday without any relief. She was also taking Tylenol on top of that. Pain is sharp radiates down her leg all the way to her toes. It starts at her gluteus maximus. She denies any history of abdominal aneurysm. She denies any fevers or recent infections. Patient has no chest pain or shortness of breath, she has no further complaints.  10 Systems reviewed and are negative for acute change except as noted in the HPI.     Past Medical History  Diagnosis Date  . Atrial flutter     successful radiofrequency ablation 2007  . Paroxysmal atrial fibrillation   . Tachycardia-bradycardia syndrome     permament pacermaker, dual chamber Medtronic on 06/2011  . Hypertension   . Hyperlipidemia   . Pacemaker   . Tubular adenoma   . Hiatal hernia   . Diverticula of colon   . Family history of colon cancer     brother   Past Surgical History  Procedure Laterality Date  . Permament pacemaker  06/2011    dual chamber , Medtronic Adapta serial # [id number]  last checked 02/05/2013  . Hiatal hernia repair  2005  . Radiofrequency ablation  06/28/2006  . Colonoscopy  08/17/2010    Dr. Gala Romney- internal hemorrhoids, o/w normal rectum, L sided diverticula, tubular adenoma  . Esophagogastroduodenoscopy  10/502011    Dr. Gala Romney-  huge diaphragmatic/hiatial hernia, fundal gland polyps not manipulated o/w noral appearing gastric mucosa, patent pylorus, normal D1 & D2  . Partial hysterectomy     Family History  Problem Relation Age of Onset  . Diabetes Mother    History  Substance Use Topics  . Smoking status: Never Smoker   . Smokeless tobacco: Not on file  . Alcohol Use: No   OB History   Grav Para Term Preterm Abortions TAB SAB Ect Mult Living                 Review of Systems    Allergies  Review of patient's allergies indicates no known allergies.  Home Medications   Prior to Admission medications   Medication Sig Start Date End Date Taking? Authorizing Provider  atenolol (TENORMIN) 50 MG tablet Take 25 mg by mouth daily.   Yes Historical Provider, MD  Calcium Carb-Cholecalciferol (CALCIUM 600 + D PO) Take 1 tablet by mouth daily.    Yes Historical Provider, MD  dexlansoprazole (DEXILANT) 60 MG capsule Take 1 capsule (60 mg total) by mouth daily. 04/07/14  Yes Daneil Dolin, MD  fish oil-omega-3 fatty acids 1000 MG capsule Take 1 g by mouth daily.   Yes Historical Provider, MD  furosemide (LASIX) 20 MG tablet Take 1 tablet (20 mg total) by mouth daily. 06/12/14  Yes Wandra Arthurs, MD  gabapentin (NEURONTIN) 300 MG capsule 1 cap at bedtime 07/09/14  Yes Lezlie Octave  Delice Lesch, MD  HYDROcodone-acetaminophen (NORCO/VICODIN) 5-325 MG per tablet Take 1 tablet by mouth every 6 (six) hours as needed for moderate pain or severe pain. 05/23/14  Yes Montine Circle, PA-C  lisinopril (PRINIVIL,ZESTRIL) 20 MG tablet Take 20 mg by mouth daily.   Yes Historical Provider, MD  simvastatin (ZOCOR) 20 MG tablet Take 20 mg by mouth every evening.   Yes Historical Provider, MD  warfarin (COUMADIN) 5 MG tablet Take 2.5 mg by mouth daily.    Yes Historical Provider, MD   BP 170/81  Pulse 78  Temp(Src) 98.2 F (36.8 C) (Oral)  Resp 22  SpO2 100% Physical Exam  Nursing note and vitals reviewed. Constitutional: She is oriented to  person, place, and time. She appears well-developed and well-nourished. She appears distressed.  HENT:  Head: Normocephalic and atraumatic.  Nose: Nose normal.  Mouth/Throat: Oropharynx is clear and moist. No oropharyngeal exudate.  Eyes: Conjunctivae and EOM are normal. Pupils are equal, round, and reactive to light. No scleral icterus.  Neck: Normal range of motion. Neck supple. No JVD present. No tracheal deviation present. No thyromegaly present.  Cardiovascular: Normal rate, regular rhythm and normal heart sounds.  Exam reveals no gallop and no friction rub.   No murmur heard. Pulmonary/Chest: Effort normal and breath sounds normal. No respiratory distress. She has no wheezes. She exhibits no tenderness.  Abdominal: Soft. Bowel sounds are normal. She exhibits no distension and no mass. There is no tenderness. There is no rebound and no guarding.  Musculoskeletal: Normal range of motion. She exhibits no edema and no tenderness.  Positive straight leg raise test bilaterally.  Lymphadenopathy:    She has no cervical adenopathy.  Neurological: She is alert and oriented to person, place, and time. No cranial nerve deficit. She exhibits normal muscle tone.  5 out of 5 strength x4 extremities. Normal sensation x4 extremities.  Skin: Skin is warm and dry. No rash noted. She is not diaphoretic. No erythema. No pallor.    ED Course  Procedures (including critical care time) Labs Review Labs Reviewed  COMPREHENSIVE METABOLIC PANEL - Abnormal; Notable for the following:    Glucose, Bld 118 (*)    Creatinine, Ser 1.45 (*)    GFR calc non Af Amer 34 (*)    GFR calc Af Amer 39 (*)    All other components within normal limits  CBC WITH DIFFERENTIAL  ACETAMINOPHEN LEVEL    Imaging Review No results found.   EKG Interpretation None      MDM   Final diagnoses:  None    Patient presents emergency department for hip pain. She was initially in exquisite pain, and unable to sit still in  bed. She was given Dilaudid and upon my repeat assessment she states her pain significantly improved. She is now relaxing in the bed, in no acute distress. Patient was educated on medicines for neuropathic pain and encouraged to followup with her neurologist. Due to the risk of, overdose, patient will be given oxycodone prescription to use at home. She is also educated on maximum daily dose. Her vital signs remain within her normal limits and she is safe for discharge.  Tylenol level 26.8. Nontoxic.   Everlene Balls, MD 08/20/14 [place], MD 08/20/14 [phone]

## 2014-08-21 ENCOUNTER — Telehealth: Payer: Self-pay | Admitting: Family Medicine

## 2014-08-21 ENCOUNTER — Ambulatory Visit: Payer: Medicare Other | Attending: Neurology | Admitting: Physical Therapy

## 2014-08-21 DIAGNOSIS — I10 Essential (primary) hypertension: Secondary | ICD-10-CM | POA: Insufficient documentation

## 2014-08-21 DIAGNOSIS — M5442 Lumbago with sciatica, left side: Secondary | ICD-10-CM | POA: Insufficient documentation

## 2014-08-21 DIAGNOSIS — Z950 Presence of cardiac pacemaker: Secondary | ICD-10-CM | POA: Insufficient documentation

## 2014-08-21 DIAGNOSIS — R293 Abnormal posture: Secondary | ICD-10-CM | POA: Diagnosis not present

## 2014-08-21 DIAGNOSIS — R5381 Other malaise: Secondary | ICD-10-CM | POA: Insufficient documentation

## 2014-08-21 NOTE — Telephone Encounter (Signed)
Pts daughter called yesterday asking if we could reschedule pts orthopedic appt for her back after they decided to try PT again 1st. Patient ended up going to the ED due to her back on Wed night. I did call MW and got patient another appt for Monday 08/24/14 @ 2:30 with Dr. Alfonso Ramus.  I called Vicky and left all appt information on her vm. I did advise if this appt date & time didn't work for them that she could call and reschedule.

## 2014-08-21 NOTE — Telephone Encounter (Signed)
Error

## 2014-08-24 ENCOUNTER — Ambulatory Visit (INDEPENDENT_AMBULATORY_CARE_PROVIDER_SITE_OTHER): Payer: Medicare Other | Admitting: Pharmacist Clinician (PhC)/ Clinical Pharmacy Specialist

## 2014-08-24 ENCOUNTER — Encounter: Payer: Medicare Other | Admitting: Physical Therapy

## 2014-08-24 DIAGNOSIS — I4891 Unspecified atrial fibrillation: Secondary | ICD-10-CM

## 2014-08-24 DIAGNOSIS — Z7901 Long term (current) use of anticoagulants: Secondary | ICD-10-CM

## 2014-08-24 LAB — POCT INR: INR: 4.2

## 2014-08-25 ENCOUNTER — Other Ambulatory Visit: Payer: Self-pay | Admitting: Sports Medicine

## 2014-08-25 DIAGNOSIS — M5126 Other intervertebral disc displacement, lumbar region: Secondary | ICD-10-CM

## 2014-08-26 ENCOUNTER — Ambulatory Visit: Payer: Medicare Other | Admitting: Physical Therapy

## 2014-08-26 DIAGNOSIS — M5442 Lumbago with sciatica, left side: Secondary | ICD-10-CM | POA: Diagnosis not present

## 2014-08-28 ENCOUNTER — Ambulatory Visit: Payer: Medicare Other | Admitting: *Deleted

## 2014-08-28 DIAGNOSIS — M5442 Lumbago with sciatica, left side: Secondary | ICD-10-CM | POA: Diagnosis not present

## 2014-09-02 ENCOUNTER — Ambulatory Visit: Payer: Medicare Other | Admitting: Pharmacist Clinician (PhC)/ Clinical Pharmacy Specialist

## 2014-09-02 ENCOUNTER — Other Ambulatory Visit: Payer: Medicare Other

## 2014-09-02 ENCOUNTER — Ambulatory Visit: Payer: Medicare Other | Admitting: Physical Therapy

## 2014-09-02 DIAGNOSIS — M5442 Lumbago with sciatica, left side: Secondary | ICD-10-CM | POA: Diagnosis not present

## 2014-09-04 ENCOUNTER — Ambulatory Visit: Payer: Medicare Other | Admitting: Pharmacist Clinician (PhC)/ Clinical Pharmacy Specialist

## 2014-09-07 ENCOUNTER — Ambulatory Visit
Admission: RE | Admit: 2014-09-07 | Discharge: 2014-09-07 | Disposition: A | Discharge status: Home / Self Care | Payer: 59 | Source: Ambulatory Visit | Attending: Sports Medicine | Admitting: Sports Medicine

## 2014-09-07 ENCOUNTER — Ambulatory Visit (INDEPENDENT_AMBULATORY_CARE_PROVIDER_SITE_OTHER): Payer: 59 | Admitting: Pharmacist Clinician (PhC)/ Clinical Pharmacy Specialist

## 2014-09-07 ENCOUNTER — Other Ambulatory Visit: Payer: Medicare Other

## 2014-09-07 DIAGNOSIS — I4891 Unspecified atrial fibrillation: Secondary | ICD-10-CM

## 2014-09-07 DIAGNOSIS — M5126 Other intervertebral disc displacement, lumbar region: Secondary | ICD-10-CM

## 2014-09-07 DIAGNOSIS — Z7901 Long term (current) use of anticoagulants: Secondary | ICD-10-CM

## 2014-09-07 LAB — POCT INR: INR: 1.1

## 2014-09-07 MED ORDER — METHYLPREDNISOLONE ACETATE 40 MG/ML INJ SUSP (RADIOLOG
120.0000 mg | Freq: Once | INTRAMUSCULAR | Status: AC
  Administered 2014-09-07: 120 mg via EPIDURAL

## 2014-09-07 MED ORDER — IOHEXOL 180 MG/ML  SOLN
1.0000 mL | Freq: Once | INTRAMUSCULAR | Status: AC | PRN
  Administered 2014-09-07: 1 mL via EPIDURAL

## 2014-09-07 NOTE — Discharge Instructions (Signed)
Post Procedure Spinal Discharge Instruction Sheet  1. You may resume a regular diet and any medications that you routinely take (including pain medications).  2. No driving day of procedure.  3. Light activity throughout the rest of the day.  Do not do any strenuous work, exercise, bending or lifting.  The day following the procedure, you can resume normal physical activity but you should refrain from exercising or physical therapy for at least three days thereafter.   Common Side Effects:   Headaches- take your usual medications as directed by your physician.  Increase your fluid intake.  Caffeinated beverages may be helpful.  Lie flat in bed until your headache resolves.   Restlessness or inability to sleep- you may have trouble sleeping for the next few days.  Ask your referring physician if you need any medication for sleep.   Facial flushing or redness- should subside within a few days.   Increased pain- a temporary increase in pain a day or two following your procedure is not unusual.  Take your pain medication as prescribed by your referring physician.   Leg cramps  Please contact our office at [phone] for the following symptoms:  Fever greater than 100 degrees.  Headaches unresolved with medication after 2-3 days.  Increased swelling, pain, or redness at injection site.  Thank you for visiting our office.   May resume coumadin today. 

## 2014-10-05 ENCOUNTER — Ambulatory Visit (INDEPENDENT_AMBULATORY_CARE_PROVIDER_SITE_OTHER): Payer: Medicare Other | Admitting: Pharmacist Clinician (PhC)/ Clinical Pharmacy Specialist

## 2014-10-05 DIAGNOSIS — I4891 Unspecified atrial fibrillation: Secondary | ICD-10-CM

## 2014-10-05 DIAGNOSIS — Z7901 Long term (current) use of anticoagulants: Secondary | ICD-10-CM

## 2014-10-05 LAB — POCT INR: INR: 4.6

## 2014-10-13 ENCOUNTER — Ambulatory Visit (INDEPENDENT_AMBULATORY_CARE_PROVIDER_SITE_OTHER): Payer: Medicare Other | Admitting: Cardiology

## 2014-10-13 ENCOUNTER — Ambulatory Visit (INDEPENDENT_AMBULATORY_CARE_PROVIDER_SITE_OTHER): Payer: Medicare Other | Admitting: *Deleted

## 2014-10-13 ENCOUNTER — Encounter: Payer: Self-pay | Admitting: Cardiology

## 2014-10-13 VITALS — BP 120/70 | HR 74 | Ht 64.0 in | Wt 109.0 lb

## 2014-10-13 DIAGNOSIS — I480 Paroxysmal atrial fibrillation: Secondary | ICD-10-CM

## 2014-10-13 DIAGNOSIS — Z950 Presence of cardiac pacemaker: Secondary | ICD-10-CM

## 2014-10-13 DIAGNOSIS — Z7901 Long term (current) use of anticoagulants: Secondary | ICD-10-CM

## 2014-10-13 DIAGNOSIS — I495 Sick sinus syndrome: Secondary | ICD-10-CM

## 2014-10-13 DIAGNOSIS — E785 Hyperlipidemia, unspecified: Secondary | ICD-10-CM

## 2014-10-13 DIAGNOSIS — I4891 Unspecified atrial fibrillation: Secondary | ICD-10-CM

## 2014-10-13 LAB — POCT INR: INR: 5.2

## 2014-10-13 NOTE — Assessment & Plan Note (Signed)
Pacer check maintaining SR.

## 2014-10-13 NOTE — Patient Instructions (Signed)
Your physician recommends that you schedule a follow-up appointment in: 3 months with Dr. Hochrein  

## 2014-10-13 NOTE — Assessment & Plan Note (Signed)
controlled 

## 2014-10-13 NOTE — Progress Notes (Signed)
10/13/2014   PCP: Purvis Kilts, MD   Chief Complaint  Patient presents with  . Shortness of Breath    Primary Cardiologist:  HPI:  78 year old female is here for follow up post hospital.  Echocardiography performed roughly one year ago showed a left ventricular ejection fraction of 55-60% and normal diastolic function with very mild valvular abnormalities. There was no evidence of elevated filling pressures. More recent Echo 06/2014 with Left ventricle: The cavity size was normal. Wall thickness was normal. Systolic function was normal. The estimated ejection fraction was in the range of 50% to 55%. Wall motion was normal; there were no regional wall motion abnormalities. Left ventricular diastolic function parameters were normal. - Left atrium: The atrium was mildly dilated. - Pericardium, extracardiac: A trivial pericardial effusion was identified. Impressions: - Low normal LV function; mild LAE; trivial pericardial effusion; there appears to be compression of LV from increased intraabdominal pressure. Compared to 08/05/13, no significantchange.    She was initially diagnosed with atrial flutter and had successful radiofrequency caval tricuspid isthmus ablation, but several years later returned with paroxysmal atrial fibrillation and required a dual-chamber permanent pacemaker for tachycardia-bradycardia syndrome (Medtronic, implanted August 2012).  S/p Paraesophageal hernia repair. - gastrostomy tube placement. 09/22/14.  She had A fib with RVR post procedure and was in the ICU.  Total hospital stay was 9 days.  Her amiodarone was increased for several days to 400 mg BID then back to 200 mg daily.  SR today.   Family concerned about mild pl effusion seen in August and present prior to surgery.  She has mild SOB but lungs are clear.  They will call if increased DOE.   No Known Allergies  Current Outpatient Prescriptions  Medication Sig  Dispense Refill  . amiodarone (PACERONE) 200 MG tablet Take 200 mg by mouth daily.    . Calcium Carb-Cholecalciferol (CALCIUM 600 + D PO) Take 1 tablet by mouth daily.     Marland Kitchen dexlansoprazole (DEXILANT) 60 MG capsule Take 1 capsule (60 mg total) by mouth daily. 30 capsule 11  . escitalopram (LEXAPRO) 10 MG tablet Take 5 mg by mouth daily.    . fish oil-omega-3 fatty acids 1000 MG capsule Take 1 g by mouth daily.    . furosemide (LASIX) 20 MG tablet Take 1 tablet (20 mg total) by mouth daily. 2 tablet 0  . gabapentin (NEURONTIN) 300 MG capsule 1 cap at bedtime 30 capsule 6  . HYDROcodone-acetaminophen (NORCO/VICODIN) 5-325 MG per tablet Take 1 tablet by mouth every 6 (six) hours as needed for moderate pain or severe pain. 15 tablet 0  . metoprolol tartrate (LOPRESSOR) 25 MG tablet Take 25 mg by mouth 2 (two) times daily.    Marland Kitchen oxyCODONE (OXY IR/ROXICODONE) 5 MG immediate release tablet Take 1 tablet (5 mg total) by mouth 3 (three) times daily as needed for severe pain. 21 tablet 0  . simvastatin (ZOCOR) 20 MG tablet Take 20 mg by mouth every evening.    . warfarin (COUMADIN) 5 MG tablet Take 2.5 mg by mouth daily.      No current facility-administered medications for this visit.    Past Medical History  Diagnosis Date  . Atrial flutter     successful radiofrequency ablation 2007  . Paroxysmal atrial fibrillation   . Tachycardia-bradycardia syndrome     permament pacermaker, dual chamber Medtronic on 06/2011  . Hypertension   . Hyperlipidemia   .  Pacemaker   . Tubular adenoma   . Hiatal hernia   . Diverticula of colon   . Family history of colon cancer     brother    Past Surgical History  Procedure Laterality Date  . Permament pacemaker  06/2011    dual chamber , Medtronic Adapta serial # [id number]  last checked 02/05/2013  . Hiatal hernia repair  2005  . Radiofrequency ablation  06/28/2006  . Colonoscopy  08/17/2010    Dr. Gala Romney- internal hemorrhoids, o/w normal rectum, L sided  diverticula, tubular adenoma  . Esophagogastroduodenoscopy  10/502011    Dr. Gala Romney- huge diaphragmatic/hiatial hernia, fundal gland polyps not manipulated o/w noral appearing gastric mucosa, patent pylorus, normal D1 & D2  . Partial hysterectomy      LTJ:[id number]:RA colds or fevers, weight decrease, poor appetite post procedure. Skin:no rashes or ulcers HEENT:no blurred vision, no congestion CV:see HPI PUL:see HPI GI:no diarrhea constipation or melena, no indigestion GU:no hematuria, no dysuria MS:no joint pain, no claudication Neuro:no syncope, no lightheadedness Endo:no diabetes, no thyroid disease  Wt Readings from Last 3 Encounters:  10/13/14 109 lb (49.442 kg)  07/09/14 118 lb (53.524 kg)  07/07/14 117 lb 6.4 oz (53.252 kg)    PHYSICAL EXAM BP 120/70 mmHg  Pulse 74  Ht 5\' 4"  (1.626 m)  Wt 109 lb (49.442 kg)  BMI 18.70 kg/m2 General:Pleasant affect, NAD Skin:Warm and dry, brisk capillary refill HEENT:normocephalic, sclera clear, mucus membranes moist Neck:supple, no JVD, no bruits  Heart:S1S2 RRR without murmur, gallup, rub or click Lungs:clear without rales, rhonchi, or wheezes QTM:AUQJ, non tender, + BS, do not palpate liver spleen or masses, site of chest tube with mild drainage, GU dressing stable.  Ext:no lower ext edema, 2+ pedal pulses, 2+ radial pulses Neuro:alert and oriented X 3, MAE, follows commands, + facial symmetry EKG:SR rate of 74, no acute changes  ASSESSMENT AND PLAN Paroxysmal atrial fibrillation Pt with rapid a fib. Post op, had increased amiodarone for several days and has maintained SR since the 16th of Nov. Now on amiodarone at 200 mg daily.   Pacemaker Pacer check maintaining SR.  Long term current use of anticoagulant therapy INR elevated today, instructions given.  Hyperlipidemia controlled

## 2014-10-13 NOTE — Assessment & Plan Note (Signed)
Pt with rapid a fib. Post op, had increased amiodarone for several days and has maintained SR since the 16th of Nov. Now on amiodarone at 200 mg daily.

## 2014-10-13 NOTE — Assessment & Plan Note (Signed)
INR elevated today, instructions given.

## 2014-10-15 LAB — MDC_IDC_ENUM_SESS_TYPE_INCLINIC
Battery Impedance: 160 Ohm
Battery Voltage: 2.78 V
Brady Statistic AP VP Percent: 1 %
Brady Statistic AP VS Percent: 94 %
Brady Statistic AS VP Percent: 0 %
Brady Statistic AS VS Percent: 5 %
Date Time Interrogation Session: 20151201151614
Lead Channel Impedance Value: 555 Ohm
Lead Channel Pacing Threshold Amplitude: 0.5 V
Lead Channel Pacing Threshold Amplitude: 0.75 V
Lead Channel Pacing Threshold Pulse Width: 0.4 ms
Lead Channel Setting Pacing Amplitude: 2 V
Lead Channel Setting Pacing Pulse Width: 0.4 ms
MDC IDC MSMT BATTERY REMAINING LONGEVITY: 141 mo
MDC IDC MSMT LEADCHNL RA IMPEDANCE VALUE: 508 Ohm
MDC IDC MSMT LEADCHNL RV PACING THRESHOLD PULSEWIDTH: 0.4 ms
MDC IDC MSMT LEADCHNL RV SENSING INTR AMPL: 8 mV
MDC IDC SET LEADCHNL RA PACING AMPLITUDE: 1.5 V
MDC IDC SET LEADCHNL RV SENSING SENSITIVITY: 4 mV

## 2014-10-15 NOTE — Progress Notes (Signed)
Pacemaker check in clinic w/[facility]. Normal device function. Thresholds, sensing, impedances consistent with previous measurements. Device programmed to maximize longevity. 13 mode switches (3.7%)---6 AHR episodes---max dur. 5 hrs 6 mins, Max A >400, Max Avg V 116, last 11/16 + Warfarin. No high ventricular rates noted. Device programmed at appropriate safety margins. Histogram distribution appropriate for patient activity level. Device programmed to optimize intrinsic conduction. Estimated longevity 11.5 years. Patient will follow up with [facility] in 6 months.

## 2014-10-19 ENCOUNTER — Ambulatory Visit (INDEPENDENT_AMBULATORY_CARE_PROVIDER_SITE_OTHER): Payer: Medicare Other | Admitting: Pharmacist Clinician (PhC)/ Clinical Pharmacy Specialist

## 2014-10-19 DIAGNOSIS — I480 Paroxysmal atrial fibrillation: Secondary | ICD-10-CM

## 2014-10-19 DIAGNOSIS — Z7901 Long term (current) use of anticoagulants: Secondary | ICD-10-CM

## 2014-10-19 LAB — POCT INR: INR: 2.4

## 2014-10-23 NOTE — Addendum Note (Signed)
Addended by: Vear Clock on: 10/23/2014 01:26 PM   Modules accepted: Orders

## 2014-11-02 ENCOUNTER — Encounter: Payer: Self-pay | Admitting: Cardiovascular Disease

## 2014-11-02 ENCOUNTER — Ambulatory Visit (INDEPENDENT_AMBULATORY_CARE_PROVIDER_SITE_OTHER): Payer: Medicare Other | Admitting: Pharmacist Clinician (PhC)/ Clinical Pharmacy Specialist

## 2014-11-02 DIAGNOSIS — I4891 Unspecified atrial fibrillation: Secondary | ICD-10-CM

## 2014-11-02 DIAGNOSIS — Z7901 Long term (current) use of anticoagulants: Secondary | ICD-10-CM

## 2014-11-02 LAB — POCT INR: INR: 3.5

## 2014-11-14 ENCOUNTER — Encounter (HOSPITAL_COMMUNITY): Payer: Self-pay | Admitting: *Deleted

## 2014-11-14 ENCOUNTER — Inpatient Hospital Stay (HOSPITAL_COMMUNITY)
Admission: EM | Admit: 2014-11-14 | Discharge: 2014-11-18 | DRG: 469 | Disposition: A | Discharge status: Skilled Nursing Facility | Payer: Medicare Other | Attending: Internal Medicine | Admitting: Internal Medicine

## 2014-11-14 ENCOUNTER — Emergency Department (HOSPITAL_COMMUNITY): Payer: Medicare Other

## 2014-11-14 DIAGNOSIS — Z7901 Long term (current) use of anticoagulants: Secondary | ICD-10-CM | POA: Insufficient documentation

## 2014-11-14 DIAGNOSIS — K219 Gastro-esophageal reflux disease without esophagitis: Secondary | ICD-10-CM | POA: Diagnosis present

## 2014-11-14 DIAGNOSIS — D509 Iron deficiency anemia, unspecified: Secondary | ICD-10-CM | POA: Diagnosis present

## 2014-11-14 DIAGNOSIS — R52 Pain, unspecified: Secondary | ICD-10-CM

## 2014-11-14 DIAGNOSIS — M25552 Pain in left hip: Secondary | ICD-10-CM | POA: Diagnosis present

## 2014-11-14 DIAGNOSIS — Z681 Body mass index (BMI) 19 or less, adult: Secondary | ICD-10-CM

## 2014-11-14 DIAGNOSIS — W010XXA Fall on same level from slipping, tripping and stumbling without subsequent striking against object, initial encounter: Secondary | ICD-10-CM | POA: Diagnosis present

## 2014-11-14 DIAGNOSIS — I10 Essential (primary) hypertension: Secondary | ICD-10-CM | POA: Diagnosis present

## 2014-11-14 DIAGNOSIS — Z950 Presence of cardiac pacemaker: Secondary | ICD-10-CM | POA: Diagnosis not present

## 2014-11-14 DIAGNOSIS — S72002A Fracture of unspecified part of neck of left femur, initial encounter for closed fracture: Secondary | ICD-10-CM | POA: Diagnosis present

## 2014-11-14 DIAGNOSIS — E43 Unspecified severe protein-calorie malnutrition: Secondary | ICD-10-CM | POA: Diagnosis present

## 2014-11-14 DIAGNOSIS — I495 Sick sinus syndrome: Secondary | ICD-10-CM | POA: Diagnosis present

## 2014-11-14 DIAGNOSIS — I480 Paroxysmal atrial fibrillation: Secondary | ICD-10-CM

## 2014-11-14 DIAGNOSIS — I482 Chronic atrial fibrillation, unspecified: Secondary | ICD-10-CM | POA: Diagnosis present

## 2014-11-14 DIAGNOSIS — Y92009 Unspecified place in unspecified non-institutional (private) residence as the place of occurrence of the external cause: Secondary | ICD-10-CM

## 2014-11-14 DIAGNOSIS — Z79899 Other long term (current) drug therapy: Secondary | ICD-10-CM | POA: Diagnosis not present

## 2014-11-14 DIAGNOSIS — E785 Hyperlipidemia, unspecified: Secondary | ICD-10-CM | POA: Diagnosis present

## 2014-11-14 DIAGNOSIS — S72009A Fracture of unspecified part of neck of unspecified femur, initial encounter for closed fracture: Secondary | ICD-10-CM | POA: Insufficient documentation

## 2014-11-14 DIAGNOSIS — Z96649 Presence of unspecified artificial hip joint: Secondary | ICD-10-CM

## 2014-11-14 HISTORY — DX: Unspecified severe protein-calorie malnutrition: E43

## 2014-11-14 LAB — TYPE AND SCREEN
ABO/RH(D): O POS
ANTIBODY SCREEN: NEGATIVE

## 2014-11-14 LAB — TROPONIN I: Troponin I: <0.03 ng/mL (ref <0.031)

## 2014-11-14 LAB — COMPREHENSIVE METABOLIC PANEL
ALT: 16 U/L (ref 0–35)
ANION GAP: 14 (ref 5–15)
AST: 22 U/L (ref 0–37)
Albumin: 3.7 g/dL (ref 3.5–5.2)
Alkaline Phosphatase: 47 U/L (ref 39–117)
BILIRUBIN TOTAL: 0.8 mg/dL (ref 0.3–1.2)
BUN: 19 mg/dL (ref 6–23)
CO2: 20 mmol/L (ref 19–32)
Calcium: 8.3 mg/dL — ABNORMAL LOW (ref 8.4–10.5)
Chloride: 104 mEq/L (ref 96–112)
Creatinine, Ser: 1 mg/dL (ref 0.50–1.10)
GFR calc Af Amer: 62 mL/min — ABNORMAL LOW (ref >90)
GFR, EST NON AFRICAN AMERICAN: 53 mL/min — AB (ref >90)
Glucose, Bld: 141 mg/dL — ABNORMAL HIGH (ref 70–99)
Potassium: 4.2 mmol/L (ref 3.5–5.1)
SODIUM: 138 mmol/L (ref 135–145)
Total Protein: 6.7 g/dL (ref 6.0–8.3)

## 2014-11-14 LAB — ABO/RH: ABO/RH(D): O POS

## 2014-11-14 LAB — PROTIME-INR
INR: 2.97 — AB (ref 0.00–1.49)
Prothrombin Time: 31.1 seconds — ABNORMAL HIGH (ref 11.6–15.2)

## 2014-11-14 LAB — CBC WITH DIFFERENTIAL/PLATELET
BASOS ABS: 0 10*3/uL (ref 0.0–0.1)
Basophils Relative: 0 % (ref 0–1)
Eosinophils Absolute: 0.1 10*3/uL (ref 0.0–0.7)
Eosinophils Relative: 1 % (ref 0–5)
HCT: 37.9 % (ref 36.0–46.0)
HEMOGLOBIN: 11.9 g/dL — AB (ref 12.0–15.0)
LYMPHS ABS: 1.2 10*3/uL (ref 0.7–4.0)
Lymphocytes Relative: 11 % — ABNORMAL LOW (ref 12–46)
MCH: 33.2 pg (ref 26.0–34.0)
MCHC: 31.4 g/dL (ref 30.0–36.0)
MCV: 105.9 fL — ABNORMAL HIGH (ref 78.0–100.0)
MONO ABS: 1.1 10*3/uL — AB (ref 0.1–1.0)
MONOS PCT: 10 % (ref 3–12)
NEUTROS ABS: 8.9 10*3/uL — AB (ref 1.7–7.7)
NEUTROS PCT: 78 % — AB (ref 43–77)
Platelets: 243 10*3/uL (ref 150–400)
RBC: 3.58 MIL/uL — ABNORMAL LOW (ref 3.87–5.11)
RDW: 14.8 % (ref 11.5–15.5)
WBC: 11.3 10*3/uL — ABNORMAL HIGH (ref 4.0–10.5)

## 2014-11-14 MED ORDER — ZOLPIDEM TARTRATE 5 MG PO TABS
5.0000 mg | ORAL_TABLET | Freq: Once | ORAL | Status: AC
  Administered 2014-11-15: 5 mg via ORAL
  Filled 2014-11-14: qty 1

## 2014-11-14 MED ORDER — METOPROLOL TARTRATE 25 MG PO TABS
25.0000 mg | ORAL_TABLET | Freq: Once | ORAL | Status: AC
  Administered 2014-11-14: 25 mg via ORAL
  Filled 2014-11-14: qty 1

## 2014-11-14 MED ORDER — MORPHINE SULFATE 2 MG/ML IJ SOLN
0.5000 mg | INTRAMUSCULAR | Status: DC | PRN
  Administered 2014-11-15 – 2014-11-17 (×6): 0.5 mg via INTRAVENOUS
  Filled 2014-11-14 (×6): qty 1

## 2014-11-14 MED ORDER — SODIUM CHLORIDE 0.9 % IV SOLN
INTRAVENOUS | Status: DC
  Administered 2014-11-14: 21:00:00 via INTRAVENOUS

## 2014-11-14 MED ORDER — ONDANSETRON HCL 4 MG/2ML IJ SOLN
4.0000 mg | Freq: Once | INTRAMUSCULAR | Status: AC
  Administered 2014-11-14: 4 mg via INTRAVENOUS
  Filled 2014-11-14: qty 2

## 2014-11-14 MED ORDER — SODIUM CHLORIDE 0.9 % IV SOLN
INTRAVENOUS | Status: DC
  Administered 2014-11-15: via INTRAVENOUS

## 2014-11-14 MED ORDER — HYDROCODONE-ACETAMINOPHEN 5-325 MG PO TABS
1.0000 | ORAL_TABLET | Freq: Four times a day (QID) | ORAL | Status: DC | PRN
  Administered 2014-11-15: 1 via ORAL
  Administered 2014-11-15: 2 via ORAL
  Filled 2014-11-14: qty 2
  Filled 2014-11-14: qty 1

## 2014-11-14 MED ORDER — METOPROLOL TARTRATE 1 MG/ML IV SOLN
5.0000 mg | Freq: Three times a day (TID) | INTRAVENOUS | Status: DC
  Administered 2014-11-15 – 2014-11-18 (×8): 5 mg via INTRAVENOUS
  Filled 2014-11-14 (×15): qty 5

## 2014-11-14 MED ORDER — MORPHINE SULFATE 4 MG/ML IJ SOLN
4.0000 mg | Freq: Once | INTRAMUSCULAR | Status: AC
  Administered 2014-11-14: 4 mg via INTRAVENOUS
  Filled 2014-11-14: qty 1

## 2014-11-14 NOTE — ED Notes (Signed)
Per EMS report: pt had a fall yesterday at home and today pt was walking to the table and heard a "pop" and went down to the ground. Pt denies any LOC. EMS found pt with a left leg shortening with extreme pain.  EMS gave pt 10mg  IV morphine and pt is now more comfortable on arrival to ED.  Pt has a pacemaker and does take coumadin. Pt a/o x 4 and normally ambulatory.

## 2014-11-14 NOTE — ED Provider Notes (Signed)
CSN: [id number]     Arrival date & time 11/14/14  1929 History   First MD Initiated Contact with Patient 11/14/14 1933     Chief Complaint  Patient presents with  . Fall  . Hip Pain     (Consider location/radiation/quality/duration/timing/severity/associated sxs/prior Treatment) HPI The patient fell yesterday in her home. At that time she did have some pain in her left hip. She however seemed to feel better and had been able to get up and move around. She did not seek treatment in the emergency department as it did not seem to be too severe. Today however she had been sitting at the table and put weight on the hip and heard a pop. Immediately she had severe pain and was unable to bear weight. She did fall to the ground however she denies any other associated injury. She denies that she struck her head. Denies that she has a headache. Denies that she has any chest pain. She however has not been able to move her leg without severe pain. The patient does take Coumadin. Family members and patient were asked several times about any episode of hitting her head or headache. They unequivocally denied that this had occurred at any point in time through the history of this injury. Past Medical History  Diagnosis Date  . Atrial flutter     successful radiofrequency ablation 2007  . Paroxysmal atrial fibrillation   . Tachycardia-bradycardia syndrome     permament pacermaker, dual chamber Medtronic on 06/2011  . Hypertension   . Hyperlipidemia   . Pacemaker   . Tubular adenoma   . Hiatal hernia   . Diverticula of colon   . Family history of colon cancer     brother   Past Surgical History  Procedure Laterality Date  . Permament pacemaker  06/2011    dual chamber , Medtronic Adapta serial # [id number]  last checked 02/05/2013  . Hiatal hernia repair  2005  . Radiofrequency ablation  06/28/2006  . Colonoscopy  08/17/2010    Dr. Gala Romney- internal hemorrhoids, o/w normal rectum, L sided diverticula,  tubular adenoma  . Esophagogastroduodenoscopy  10/502011    Dr. Gala Romney- huge diaphragmatic/hiatial hernia, fundal gland polyps not manipulated o/w noral appearing gastric mucosa, patent pylorus, normal D1 & D2  . Partial hysterectomy    . Abdominal surgery     Family History  Problem Relation Age of Onset  . Diabetes Mother    History  Substance Use Topics  . Smoking status: Never Smoker   . Smokeless tobacco: Never Used  . Alcohol Use: No   OB History    No data available     Review of Systems 10 Systems reviewed and are negative for acute change except as noted in the HPI.    Allergies  Review of patient's allergies indicates no known allergies.  Home Medications   Prior to Admission medications   Medication Sig Start Date End Date Taking? Authorizing Provider  amiodarone (PACERONE) 200 MG tablet Take 200 mg by mouth daily. 10/02/14  Yes Historical Provider, MD  dexlansoprazole (DEXILANT) 60 MG capsule Take 1 capsule (60 mg total) by mouth daily. 04/07/14  Yes Daneil Dolin, MD  escitalopram (LEXAPRO) 10 MG tablet Take 5 mg by mouth daily. 10/02/14  Yes Historical Provider, MD  metoprolol tartrate (LOPRESSOR) 25 MG tablet Take 25 mg by mouth 2 (two) times daily. 10/02/14  Yes Historical Provider, MD  simvastatin (ZOCOR) 20 MG tablet Take 20 mg by mouth every  evening.   Yes Historical Provider, MD  warfarin (COUMADIN) 2 MG tablet Take 4 mg by mouth daily.   Yes Historical Provider, MD  zolpidem (AMBIEN) 5 MG tablet Take 5 mg by mouth at bedtime as needed for sleep.   Yes Historical Provider, MD  furosemide (LASIX) 20 MG tablet Take 1 tablet (20 mg total) by mouth daily. Patient not taking: Reported on 11/14/2014 06/12/14   Wandra Arthurs, MD  gabapentin (NEURONTIN) 300 MG capsule 1 cap at bedtime Patient not taking: Reported on 11/14/2014 07/09/14   Cameron Sprang, MD  HYDROcodone-acetaminophen (NORCO/VICODIN) 5-325 MG per tablet Take 1 tablet by mouth every 6 (six) hours as needed  for moderate pain or severe pain. Patient not taking: Reported on 11/14/2014 05/23/14   Montine Circle, PA-C  oxyCODONE (OXY IR/ROXICODONE) 5 MG immediate release tablet Take 1 tablet (5 mg total) by mouth 3 (three) times daily as needed for severe pain. Patient not taking: Reported on 11/14/2014 08/20/14   Everlene Balls, MD   BP 120/63 mmHg  Pulse 70  Temp(Src) 97.8 F (36.6 C) (Oral)  Resp 14  SpO2 97% Physical Exam  Constitutional: She is oriented to person, place, and time. She appears well-developed and well-nourished. No distress.  HENT:  Head: Normocephalic and atraumatic.  Eyes: EOM are normal. Pupils are equal, round, and reactive to light.  Neck: Neck supple.  No cervical spine tenderness to palpation.  Cardiovascular: Normal rate, regular rhythm, normal heart sounds and intact distal pulses.   Pulmonary/Chest: Effort normal and breath sounds normal.  Anterior pulmonary auscultation.  Abdominal: Soft. Bowel sounds are normal. She exhibits no distension. There is no tenderness.  Musculoskeletal: She exhibits no edema.  Patient's left leg is shortened and externally rotated. Dorsalis pedis pulses 2+ and strong. The foot is warm and dry. Patient is able to move her toes to command.  Neurological: She is alert and oriented to person, place, and time. She has normal strength. No cranial nerve deficit. Coordination normal. GCS eye subscore is 4. GCS verbal subscore is 5. GCS motor subscore is 6.  Skin: Skin is warm, dry and intact.  Psychiatric: She has a normal mood and affect.    ED Course  Procedures (including critical care time) Labs Review Labs Reviewed  COMPREHENSIVE METABOLIC PANEL - Abnormal; Notable for the following:    Glucose, Bld 141 (*)    Calcium 8.3 (*)    GFR calc non Af Amer 53 (*)    GFR calc Af Amer 62 (*)    All other components within normal limits  CBC WITH DIFFERENTIAL - Abnormal; Notable for the following:    WBC 11.3 (*)    RBC 3.58 (*)    Hemoglobin  11.9 (*)    MCV 105.9 (*)    Neutrophils Relative % 78 (*)    Neutro Abs 8.9 (*)    Lymphocytes Relative 11 (*)    Monocytes Absolute 1.1 (*)    All other components within normal limits  PROTIME-INR - Abnormal; Notable for the following:    Prothrombin Time 31.1 (*)    INR 2.97 (*)    All other components within normal limits  TROPONIN I  URINALYSIS, ROUTINE W REFLEX MICROSCOPIC  TYPE AND SCREEN  ABO/RH    Imaging Review Dg Chest 1 View  11/14/2014   CLINICAL DATA:  Status post two falls, yesterday and today. Concern for chest injury. Initial encounter.  EXAM: CHEST - 1 VIEW  COMPARISON:  Chest radiograph  and CTA of the chest performed 06/12/2014  FINDINGS: The lungs are well-aerated. Mild vascular congestion is noted. No pleural effusion or pneumothorax is seen. Mild scarring is noted at the lung apices.  The cardiomediastinal silhouette is borderline normal in size. A pacemaker is noted overlying the left chest wall, with leads ending overlying the right atrium and right ventricle. The patient's very large hiatal hernia is partially filled with solid material, with surrounding atelectasis. No acute osseous abnormalities are seen.  IMPRESSION: 1. No displaced rib fracture seen. 2. Mild vascular congestion noted. Mild scarring at the lung apices. 3. Very large hiatal hernia is partially filled with solid material, with surrounding atelectasis.   Electronically Signed   By: Garald Balding M.D.   On: 11/14/2014 20:28   Dg Hip Complete Left  11/14/2014   CLINICAL DATA:  Initial encounter for falls yesterday and today in a pop in her hip while walking in her house today.  EXAM: LEFT HIP - COMPLETE 2+ VIEW  COMPARISON:  02/28/2014  FINDINGS: Bones are diffusely demineralized. No evidence for sacral fracture. Symphysis pubis is unremarkable.  AP and cross-table lateral views of the left hip show a subcapital femoral neck fracture with bony over riding and varus angulation.  IMPRESSION: Left femoral  neck fracture with varus angulation.   Electronically Signed   By: Misty Stanley M.D.   On: 11/14/2014 20:29     EKG Interpretation   Date/Time:  Saturday November 14 2014 19:34:59 EST Ventricular Rate:  70 PR Interval:  217 QRS Duration: 117 QT Interval:  441 QTC Calculation: 476 R Axis:   75 Text Interpretation:  Atrial-paced rhythm Incomplete right bundle branch  block agree. no STEMI. no change from old. Confirmed by Johnney Killian, MD,  Jeannie Done [phone]) on 11/14/2014 8:27:32 PM     Consult: Dr. Sharol Given consult requested transfer to [facility] for surgery tomorrow. Consult: Hospitalist for admission. Family members wished patient to stay at [facility] for surgery. Hospitalist has arranged alternate orthopedic surgeon for surgery at [facility]. MDM   Final diagnoses:  Left displaced femoral neck fracture, closed, initial encounter  Paroxysmal atrial fibrillation  Anticoagulated on Coumadin   Patient presents with a fall yesterday where upon she had hip pain but was ambulatory. Today with weightbearing the hip fractured completely. They deny any other associated injury. By physical examination there is no suggestion of other associated injury. Patient has complicated medical history of being anticoagulate on Coumadin for paroxysmal atrial fibrillation. Baseline medical conditions do appear stable at this point.    Charlesetta Shanks, MD 11/14/14 [phone]

## 2014-11-14 NOTE — ED Notes (Signed)
Bed: QB16 Expected date:  Expected time:  Means of arrival:  Comments: Fall, shortening of left leg

## 2014-11-14 NOTE — H&P (Signed)
[facility] History and Physical  YETZALI WELD QBH:[id number] DOB: [birth year] DOA: 11/14/2014  Referring physician: ER physician. PCP: Purvis Kilts, MD   Chief Complaint: Fall. Left hip pain.  HPI: JALEESA CERVI is a 79 y.o. female with history of atrial fibrillation, history of tachybradycardia syndrome status post pacemaker placement, hyperlipidemia who has had a recent surgery for hiatal hernia at [facility] 2 months ago was brought to the ER after patient had a fall yesterday. Patient also had a fall today following which patient started noticing increasing pain in the left hip. X-rays reveal left hip fracture and orthopedic surgeon Dr. Alvan Dame was consulted and patient will be admitted for further management of the left hip fracture. Patient states she slipped and fell denies any loss of consciousness chest pain palpitations nausea vomiting abdominal pain or diarrhea. Denies any shortness of breath fever chills. Denies hitting her head. Patient has had recent surgery for hiatal hernia 2 months ago and her PEG tube which was placed for her hiatal hernia was just removed last week.  Review of Systems: As presented in the history of presenting illness, rest negative.  Past Medical History  Diagnosis Date  . Atrial flutter     successful radiofrequency ablation 2007  . Paroxysmal atrial fibrillation   . Tachycardia-bradycardia syndrome     permament pacermaker, dual chamber Medtronic on 06/2011  . Hypertension   . Hyperlipidemia   . Pacemaker   . Tubular adenoma   . Hiatal hernia   . Diverticula of colon   . Family history of colon cancer     brother   Past Surgical History  Procedure Laterality Date  . Permament pacemaker  06/2011    dual chamber , Medtronic Adapta serial # [id number]  last checked 02/05/2013  . Hiatal hernia repair  2005  . Radiofrequency ablation  06/28/2006  . Colonoscopy  08/17/2010    Dr. Gala Romney- internal hemorrhoids, o/w normal rectum, L  sided diverticula, tubular adenoma  . Esophagogastroduodenoscopy  10/502011    Dr. Gala Romney- huge diaphragmatic/hiatial hernia, fundal gland polyps not manipulated o/w noral appearing gastric mucosa, patent pylorus, normal D1 & D2  . Partial hysterectomy    . Abdominal surgery     Social History:  reports that she has never smoked. She has never used smokeless tobacco. She reports that she does not drink alcohol or use illicit drugs. Where does patient live home. Can patient participate in ADLs? Yes.  No Known Allergies  Family History:  Family History  Problem Relation Age of Onset  . Diabetes Mother       Prior to Admission medications   Medication Sig Start Date End Date Taking? Authorizing Provider  amiodarone (PACERONE) 200 MG tablet Take 200 mg by mouth daily. 10/02/14  Yes Historical Provider, MD  dexlansoprazole (DEXILANT) 60 MG capsule Take 1 capsule (60 mg total) by mouth daily. 04/07/14  Yes Daneil Dolin, MD  escitalopram (LEXAPRO) 10 MG tablet Take 5 mg by mouth daily. 10/02/14  Yes Historical Provider, MD  metoprolol tartrate (LOPRESSOR) 25 MG tablet Take 25 mg by mouth 2 (two) times daily. 10/02/14  Yes Historical Provider, MD  simvastatin (ZOCOR) 20 MG tablet Take 20 mg by mouth every evening.   Yes Historical Provider, MD  warfarin (COUMADIN) 2 MG tablet Take 4 mg by mouth daily.   Yes Historical Provider, MD  zolpidem (AMBIEN) 5 MG tablet Take 5 mg by mouth at bedtime as needed for sleep.   Yes Historical  Provider, MD  furosemide (LASIX) 20 MG tablet Take 1 tablet (20 mg total) by mouth daily. Patient not taking: Reported on 11/14/2014 06/12/14   Wandra Arthurs, MD  gabapentin (NEURONTIN) 300 MG capsule 1 cap at bedtime Patient not taking: Reported on 11/14/2014 07/09/14   Cameron Sprang, MD  HYDROcodone-acetaminophen (NORCO/VICODIN) 5-325 MG per tablet Take 1 tablet by mouth every 6 (six) hours as needed for moderate pain or severe pain. Patient not taking: Reported on 11/14/2014  05/23/14   Montine Circle, PA-C  oxyCODONE (OXY IR/ROXICODONE) 5 MG immediate release tablet Take 1 tablet (5 mg total) by mouth 3 (three) times daily as needed for severe pain. Patient not taking: Reported on 11/14/2014 08/20/14   Everlene Balls, MD    Physical Exam: Filed Vitals:   11/14/14 1935 11/14/14 2050 11/14/14 2115 11/14/14 2230  BP: 157/82 120/63  118/71  Pulse: 70 70 70 70  Temp: 97.8 F (36.6 C)     TempSrc: Oral     Resp: 15 19 14 12   SpO2: 95% 97% 97% 95%     General:  Moderately built and nourished.  Eyes: Anicteric no pallor.  ENT: No discharge from the ears eyes nose mouth.  Neck: No mass felt.  Cardiovascular: S1 and S2 heard.  Respiratory: No rhonchi or crepitations.  Abdomen: Soft nontender bowel sounds present. PEG tube site has been dressed.  Skin: No rash.  Musculoskeletal: No edema. Pain ongoing left hip.  Psychiatric: Appears normal.  Neurologic: Alert awake oriented to time place and person. Moves all extremities.  Labs on Admission:  Basic Metabolic Panel:  Recent Labs Lab 11/14/14 2044  NA 138  K 4.2  CL 104  CO2 20  GLUCOSE 141*  BUN 19  CREATININE 1.00  CALCIUM 8.3*   Liver Function Tests:  Recent Labs Lab 11/14/14 2044  AST 22  ALT 16  ALKPHOS 47  BILITOT 0.8  PROT 6.7  ALBUMIN 3.7   No results for input(s): LIPASE, AMYLASE in the last 168 hours. No results for input(s): AMMONIA in the last 168 hours. CBC:  Recent Labs Lab 11/14/14 2044  WBC 11.3*  NEUTROABS 8.9*  HGB 11.9*  HCT 37.9  MCV 105.9*  PLT 243   Cardiac Enzymes:  Recent Labs Lab 11/14/14 2044  TROPONINI <0.03    BNP (last 3 results)  Recent Labs  06/12/14 1918  PROBNP 857.5*   CBG: No results for input(s): GLUCAP in the last 168 hours.  Radiological Exams on Admission: Dg Chest 1 View  11/14/2014   CLINICAL DATA:  Status post two falls, yesterday and today. Concern for chest injury. Initial encounter.  EXAM: CHEST - 1 VIEW   COMPARISON:  Chest radiograph and CTA of the chest performed 06/12/2014  FINDINGS: The lungs are well-aerated. Mild vascular congestion is noted. No pleural effusion or pneumothorax is seen. Mild scarring is noted at the lung apices.  The cardiomediastinal silhouette is borderline normal in size. A pacemaker is noted overlying the left chest wall, with leads ending overlying the right atrium and right ventricle. The patient's very large hiatal hernia is partially filled with solid material, with surrounding atelectasis. No acute osseous abnormalities are seen.  IMPRESSION: 1. No displaced rib fracture seen. 2. Mild vascular congestion noted. Mild scarring at the lung apices. 3. Very large hiatal hernia is partially filled with solid material, with surrounding atelectasis.   Electronically Signed   By: Garald Balding M.D.   On: 11/14/2014 20:28   Dg  Hip Complete Left  11/14/2014   CLINICAL DATA:  Initial encounter for falls yesterday and today in a pop in her hip while walking in her house today.  EXAM: LEFT HIP - COMPLETE 2+ VIEW  COMPARISON:  02/28/2014  FINDINGS: Bones are diffusely demineralized. No evidence for sacral fracture. Symphysis pubis is unremarkable.  AP and cross-table lateral views of the left hip show a subcapital femoral neck fracture with bony over riding and varus angulation.  IMPRESSION: Left femoral neck fracture with varus angulation.   Electronically Signed   By: Misty Stanley M.D.   On: 11/14/2014 20:29    EKG: Independently reviewed. Paced rhythm.  Assessment/Plan Principal Problem:   Closed left hip fracture Active Problems:   Tachycardia-bradycardia syndrome   Hyperlipidemia   Chronic atrial fibrillation   1. Left hip fracture status post mechanical fall - I have discussed with on-call orthopedic surgeon Dr. Alvan Dame will be seeing patient in consult. Patient will be kept nothing by mouth after breakfast as advised by Dr. Alvan Dame. Patient is on Coumadin and at this time Dr. Alvan Dame  has advised to hold Coumadin but not to reverse it. Check INR in a.m. Type and screen and hold for FFP if required. Continue pain relief medications. Patient is medically stable for surgery. 2. Paroxysmal atrial fibrillation/history of atrial flutter status post ablation - Coumadin on hold see #1. Since patient is going to be nothing by mouth from morning I have placed patient on scheduled dose of metoprolol 5 mg IV every 8 hourly from morning and patient's night dose of metoprolol as been already ordered. Closely follow patient's heart rate. 3. History of tachybradycardia syndrome status post pacemaker placement. 4. Macrocytic anemia - will need further workup as outpatient. 5. Hyperlipidemia - continue statins when patient can take orally.   DVT Prophylaxis SCDs. Patient is on Coumadin which will be held for surgery for now. Closely follow INR.  Code Status: Full code.  Family Communication: Patient's daughters.  Disposition Plan: Admit to inpatient.    Ousmane Seeman N. [facility] Pager [phone].  If 7PM-7AM, please contact night-coverage [email or web address] Password [facility] 11/14/2014, 10:57 PM

## 2014-11-14 NOTE — ED Notes (Signed)
Hospitalist at bedside 

## 2014-11-14 NOTE — ED Notes (Signed)
Pt given a gingerale and [place] sandwich.

## 2014-11-15 DIAGNOSIS — E785 Hyperlipidemia, unspecified: Secondary | ICD-10-CM

## 2014-11-15 DIAGNOSIS — S72002A Fracture of unspecified part of neck of left femur, initial encounter for closed fracture: Secondary | ICD-10-CM | POA: Insufficient documentation

## 2014-11-15 LAB — COMPREHENSIVE METABOLIC PANEL
ALT: 15 U/L (ref 0–35)
AST: 18 U/L (ref 0–37)
Albumin: 3.4 g/dL — ABNORMAL LOW (ref 3.5–5.2)
Alkaline Phosphatase: 45 U/L (ref 39–117)
Anion gap: 3 — ABNORMAL LOW (ref 5–15)
BUN: 21 mg/dL (ref 6–23)
CO2: 27 mmol/L (ref 19–32)
Calcium: 8.3 mg/dL — ABNORMAL LOW (ref 8.4–10.5)
Chloride: 104 mEq/L (ref 96–112)
Creatinine, Ser: 0.93 mg/dL (ref 0.50–1.10)
GFR calc Af Amer: 67 mL/min — ABNORMAL LOW (ref >90)
GFR calc non Af Amer: 58 mL/min — ABNORMAL LOW (ref >90)
GLUCOSE: 111 mg/dL — AB (ref 70–99)
Potassium: 4.2 mmol/L (ref 3.5–5.1)
Sodium: 134 mmol/L — ABNORMAL LOW (ref 135–145)
Total Bilirubin: 0.6 mg/dL (ref 0.3–1.2)
Total Protein: 6.3 g/dL (ref 6.0–8.3)

## 2014-11-15 LAB — CBC WITH DIFFERENTIAL/PLATELET
Basophils Absolute: 0 10*3/uL (ref 0.0–0.1)
Basophils Relative: 0 % (ref 0–1)
Eosinophils Absolute: 0 10*3/uL (ref 0.0–0.7)
Eosinophils Relative: 0 % (ref 0–5)
HCT: 36.8 % (ref 36.0–46.0)
HEMOGLOBIN: 11.3 g/dL — AB (ref 12.0–15.0)
LYMPHS ABS: 1.5 10*3/uL (ref 0.7–4.0)
Lymphocytes Relative: 16 % (ref 12–46)
MCH: 32.8 pg (ref 26.0–34.0)
MCHC: 30.7 g/dL (ref 30.0–36.0)
MCV: 107 fL — ABNORMAL HIGH (ref 78.0–100.0)
Monocytes Absolute: 0.9 10*3/uL (ref 0.1–1.0)
Monocytes Relative: 10 % (ref 3–12)
NEUTROS ABS: 7 10*3/uL (ref 1.7–7.7)
Neutrophils Relative %: 74 % (ref 43–77)
Platelets: 206 10*3/uL (ref 150–400)
RBC: 3.44 MIL/uL — ABNORMAL LOW (ref 3.87–5.11)
RDW: 14.9 % (ref 11.5–15.5)
WBC: 9.4 10*3/uL (ref 4.0–10.5)

## 2014-11-15 LAB — PROTIME-INR
INR: 3.11 — ABNORMAL HIGH (ref 0.00–1.49)
INR: 1.6 — AB (ref 0.00–1.49)
Prothrombin Time: 32.2 seconds — ABNORMAL HIGH (ref 11.6–15.2)
Prothrombin Time: 19.2 seconds — ABNORMAL HIGH (ref 11.6–15.2)

## 2014-11-15 LAB — URINALYSIS, ROUTINE W REFLEX MICROSCOPIC
Bilirubin Urine: NEGATIVE
GLUCOSE, UA: NEGATIVE mg/dL
Hgb urine dipstick: NEGATIVE
Ketones, ur: NEGATIVE mg/dL
Leukocytes, UA: NEGATIVE
NITRITE: NEGATIVE
Protein, ur: NEGATIVE mg/dL
SPECIFIC GRAVITY, URINE: 1.034 — AB (ref 1.005–1.030)
Urobilinogen, UA: 0.2 mg/dL (ref 0.0–1.0)
pH: 5 (ref 5.0–8.0)

## 2014-11-15 MED ORDER — SODIUM CHLORIDE 0.9 % IV SOLN
INTRAVENOUS | Status: AC
  Administered 2014-11-15 – 2014-11-16 (×2): via INTRAVENOUS

## 2014-11-15 MED ORDER — AMIODARONE HCL 200 MG PO TABS
200.0000 mg | ORAL_TABLET | Freq: Every day | ORAL | Status: DC
  Administered 2014-11-15 – 2014-11-18 (×4): 200 mg via ORAL
  Filled 2014-11-15 (×4): qty 1

## 2014-11-15 MED ORDER — MORPHINE SULFATE 15 MG PO TABS
15.0000 mg | ORAL_TABLET | ORAL | Status: DC | PRN
  Administered 2014-11-15 – 2014-11-16 (×3): 15 mg via ORAL
  Filled 2014-11-15 (×3): qty 1

## 2014-11-15 MED ORDER — VITAMIN K1 10 MG/ML IJ SOLN
5.0000 mg | Freq: Once | INTRAVENOUS | Status: AC
  Administered 2014-11-15: 5 mg via INTRAVENOUS
  Filled 2014-11-15: qty 0.5

## 2014-11-15 MED ORDER — ESCITALOPRAM OXALATE 5 MG PO TABS
5.0000 mg | ORAL_TABLET | Freq: Every day | ORAL | Status: DC
  Administered 2014-11-15 – 2014-11-18 (×4): 5 mg via ORAL
  Filled 2014-11-15 (×4): qty 1

## 2014-11-15 MED ORDER — ZOLPIDEM TARTRATE 5 MG PO TABS
5.0000 mg | ORAL_TABLET | Freq: Every evening | ORAL | Status: DC | PRN
  Administered 2014-11-15 – 2014-11-17 (×2): 5 mg via ORAL
  Filled 2014-11-15 (×2): qty 1

## 2014-11-15 MED ORDER — PANTOPRAZOLE SODIUM 40 MG PO TBEC
40.0000 mg | DELAYED_RELEASE_TABLET | Freq: Every day | ORAL | Status: DC
  Filled 2014-11-15: qty 1

## 2014-11-15 MED ORDER — SIMVASTATIN 20 MG PO TABS
20.0000 mg | ORAL_TABLET | Freq: Every evening | ORAL | Status: DC
  Administered 2014-11-15 – 2014-11-17 (×2): 20 mg via ORAL
  Filled 2014-11-15 (×4): qty 1

## 2014-11-15 MED ORDER — ONDANSETRON HCL 4 MG/2ML IJ SOLN
4.0000 mg | Freq: Four times a day (QID) | INTRAMUSCULAR | Status: DC | PRN
  Administered 2014-11-15 – 2014-11-17 (×3): 4 mg via INTRAVENOUS
  Filled 2014-11-15 (×3): qty 2

## 2014-11-15 MED ORDER — GABAPENTIN 300 MG PO CAPS
300.0000 mg | ORAL_CAPSULE | Freq: Every day | ORAL | Status: DC
  Administered 2014-11-15 – 2014-11-17 (×3): 300 mg via ORAL
  Filled 2014-11-15 (×5): qty 1

## 2014-11-15 MED ORDER — MORPHINE SULFATE 15 MG PO TABS: 15.0000 mg | ORAL_TABLET | ORAL | Status: DC | PRN

## 2014-11-15 NOTE — Progress Notes (Signed)
[facility] PROGRESS NOTE  Anne Anthony TKW:[id number] DOB: 1935/11/27 DOA: 11/14/2014 PCP: Purvis Kilts, MD  Assessment/Plan: #1 left femur fracture Secondary to mechanical fall. Patient has been seen by orthopedics. Patient's INR is elevated and a such will fully may have surgery tomorrow once INR is reversed. Patient to receive vitamin K today. Per orthopedics.  #2 atrial fibrillation Currently rate controlled on IV metoprolol. Will resume home regimen of oral amiodarone. Coumadin on hold in preparation for probable surgery. INR is elevated at 3.11. Patient has been given some vitamin K. Repeat INR now at 1.60. Repeat INR in the morning. Continue to hold Coumadin.  #3 history of tachybradycardia syndrome status post PPM  #4 microcytic anemia Follow H&H. Outpatient follow-up.  # 5 hyperlipidemia Continue statin.  #6 gastroesophageal reflux disease PPI.  #7 prophylaxis PPI for GI prophylaxis. SCDs for DVT prophylaxis.  Code Status: Full Family Communication: Updated patient and daughter at bedside. Disposition Plan: Likely SNF pending surgery.   Consultants:  Orthopedics: Merla Riches, [place] 11/15/2014  Procedures:  Chest x-ray 11/14/2014  X-ray of the left hip 11/14/2014  Antibiotics:  None  HPI/Subjective: Patient complaining of left hip pain. Patient denies any chest pain. No shortness of breath.  Objective: Filed Vitals:   11/15/14 1407  BP: 109/67  Pulse: 69  Temp: 98.5 F (36.9 C)  Resp: 18    Intake/Output Summary (Last 24 hours) at 11/15/14 1414 Last data filed at 11/15/14 1000  Gross per 24 hour  Intake 499.33 ml  Output    500 ml  Net  -0.67 ml   Filed Weights   11/14/14 2347  Weight: 48.762 kg (107 lb 8 oz)    Exam:   General:  NAD  Cardiovascular: RRR  Respiratory: CTAB anterior lung fields  Abdomen: Soft, nontender, nondistended, positive bowel sounds.  Musculoskeletal: No clubbing cyanosis or edema.  Data  Reviewed: Basic Metabolic Panel:  Recent Labs Lab 11/14/14 2044 11/15/14 0540  NA 138 134*  K 4.2 4.2  CL 104 104  CO2 20 27  GLUCOSE 141* 111*  BUN 19 21  CREATININE 1.00 0.93  CALCIUM 8.3* 8.3*   Liver Function Tests:  Recent Labs Lab 11/14/14 2044 11/15/14 0540  AST 22 18  ALT 16 15  ALKPHOS 47 45  BILITOT 0.8 0.6  PROT 6.7 6.3  ALBUMIN 3.7 3.4*   No results for input(s): LIPASE, AMYLASE in the last 168 hours. No results for input(s): AMMONIA in the last 168 hours. CBC:  Recent Labs Lab 11/14/14 2044 11/15/14 0540  WBC 11.3* 9.4  NEUTROABS 8.9* 7.0  HGB 11.9* 11.3*  HCT 37.9 36.8  MCV 105.9* 107.0*  PLT 243 206   Cardiac Enzymes:  Recent Labs Lab 11/14/14 2044  TROPONINI <0.03   BNP (last 3 results)  Recent Labs  06/12/14 1918  PROBNP 857.5*   CBG: No results for input(s): GLUCAP in the last 168 hours.  No results found for this or any previous visit (from the past 240 hour(s)).   Studies: Dg Chest 1 View  11/14/2014   CLINICAL DATA:  Status post two falls, yesterday and today. Concern for chest injury. Initial encounter.  EXAM: CHEST - 1 VIEW  COMPARISON:  Chest radiograph and CTA of the chest performed 06/12/2014  FINDINGS: The lungs are well-aerated. Mild vascular congestion is noted. No pleural effusion or pneumothorax is seen. Mild scarring is noted at the lung apices.  The cardiomediastinal silhouette is borderline normal in size. A pacemaker is  noted overlying the left chest wall, with leads ending overlying the right atrium and right ventricle. The patient's very large hiatal hernia is partially filled with solid material, with surrounding atelectasis. No acute osseous abnormalities are seen.  IMPRESSION: 1. No displaced rib fracture seen. 2. Mild vascular congestion noted. Mild scarring at the lung apices. 3. Very large hiatal hernia is partially filled with solid material, with surrounding atelectasis.   Electronically Signed   By: Garald Balding M.D.   On: 11/14/2014 20:28   Dg Hip Complete Left  11/14/2014   CLINICAL DATA:  Initial encounter for falls yesterday and today in a pop in her hip while walking in her house today.  EXAM: LEFT HIP - COMPLETE 2+ VIEW  COMPARISON:  02/28/2014  FINDINGS: Bones are diffusely demineralized. No evidence for sacral fracture. Symphysis pubis is unremarkable.  AP and cross-table lateral views of the left hip show a subcapital femoral neck fracture with bony over riding and varus angulation.  IMPRESSION: Left femoral neck fracture with varus angulation.   Electronically Signed   By: Misty Stanley M.D.   On: 11/14/2014 20:29    Scheduled Meds: . metoprolol  5 mg Intravenous 3 times per day   Continuous Infusions: . sodium chloride 10 mL/hr at 11/15/14 0004  . sodium chloride 75 mL/hr (11/15/14 1410)    Principal Problem:   Closed left hip fracture Active Problems:   Tachycardia-bradycardia syndrome   Hyperlipidemia   Chronic atrial fibrillation    Time spent: 59 minutes    Yannis Broce M.D. [facility] Pager [phone]. If 7PM-7AM, please contact night-coverage at [email or web address], password [facility] 11/15/2014, 2:14 PM  LOS: 1 day

## 2014-11-15 NOTE — Progress Notes (Signed)
   Subjective:     Pt admitted this morning for ground level fall injuring left hip Diagnosed with left femur fracture and admitted for surgical management Pt currently on coumadin for afib and will continue to check INR  Currently INR 3.11; thus surgery will be tomorrow at the earliest Patient comfortable currently in supine position  Patient reports pain as mild.  Objective:   VITALS:   Filed Vitals:   11/15/14 0545  BP: 104/59  Pulse: 70  Temp: 97.9 F (36.6 C)  Resp: 18    Left lower extremity; nv intact distally Mild shortening and external rotation No rashes or edema  LABS  Recent Labs  11/14/14 2044 11/15/14 0540  HGB 11.9* 11.3*  HCT 37.9 36.8  WBC 11.3* 9.4  PLT 243 206     Recent Labs  11/14/14 2044 11/15/14 0540  NA 138 134*  K 4.2 4.2  BUN 19 21  CREATININE 1.00 0.93  GLUCOSE 141* 111*     Assessment/Plan:    Left femur fracture will elevated INR Plan to treat with vitamin K today and recheck INR Plan for surgery tomorrow Strict bedrest NPO after midnight, regular diet now Pain control as needed      Merla Riches, MPAS, PA-C  11/15/2014, 7:27 AM

## 2014-11-16 ENCOUNTER — Inpatient Hospital Stay (HOSPITAL_COMMUNITY): Payer: Medicare Other | Admitting: Certified Registered"

## 2014-11-16 ENCOUNTER — Inpatient Hospital Stay (HOSPITAL_COMMUNITY): Payer: Medicare Other

## 2014-11-16 ENCOUNTER — Encounter (HOSPITAL_COMMUNITY): Payer: Self-pay | Admitting: Certified Registered"

## 2014-11-16 ENCOUNTER — Encounter (HOSPITAL_COMMUNITY)
Admission: EM | Disposition: A | Discharge status: Skilled Nursing Facility | Payer: Self-pay | Source: Home / Self Care | Attending: Internal Medicine

## 2014-11-16 DIAGNOSIS — Z5181 Encounter for therapeutic drug level monitoring: Secondary | ICD-10-CM

## 2014-11-16 DIAGNOSIS — Z7901 Long term (current) use of anticoagulants: Secondary | ICD-10-CM

## 2014-11-16 HISTORY — PX: HIP ARTHROPLASTY: SHX981

## 2014-11-16 LAB — BASIC METABOLIC PANEL
Anion gap: 7 (ref 5–15)
BUN: 17 mg/dL (ref 6–23)
CO2: 25 mmol/L (ref 19–32)
Calcium: 8.5 mg/dL (ref 8.4–10.5)
Chloride: 101 mEq/L (ref 96–112)
Creatinine, Ser: 0.82 mg/dL (ref 0.50–1.10)
GFR calc Af Amer: 79 mL/min — ABNORMAL LOW (ref >90)
GFR calc non Af Amer: 68 mL/min — ABNORMAL LOW (ref >90)
Glucose, Bld: 94 mg/dL (ref 70–99)
Potassium: 4.2 mmol/L (ref 3.5–5.1)
SODIUM: 133 mmol/L — AB (ref 135–145)

## 2014-11-16 LAB — SURGICAL PCR SCREEN
MRSA, PCR: NEGATIVE
STAPHYLOCOCCUS AUREUS: NEGATIVE

## 2014-11-16 LAB — CBC
HEMATOCRIT: 34 % — AB (ref 36.0–46.0)
HEMOGLOBIN: 10.7 g/dL — AB (ref 12.0–15.0)
MCH: 33.5 pg (ref 26.0–34.0)
MCHC: 31.5 g/dL (ref 30.0–36.0)
MCV: 106.6 fL — AB (ref 78.0–100.0)
Platelets: 182 10*3/uL (ref 150–400)
RBC: 3.19 MIL/uL — ABNORMAL LOW (ref 3.87–5.11)
RDW: 14.6 % (ref 11.5–15.5)
WBC: 9.1 10*3/uL (ref 4.0–10.5)

## 2014-11-16 LAB — URINE CULTURE
COLONY COUNT: NO GROWTH
Culture: NO GROWTH

## 2014-11-16 LAB — PROTIME-INR
INR: 1.21 (ref 0.00–1.49)
Prothrombin Time: 15.5 seconds — ABNORMAL HIGH (ref 11.6–15.2)

## 2014-11-16 SURGERY — HEMIARTHROPLASTY, HIP, DIRECT ANTERIOR APPROACH, FOR FRACTURE
Anesthesia: General | Site: Hip | Laterality: Left

## 2014-11-16 MED ORDER — DOCUSATE SODIUM 100 MG PO CAPS
100.0000 mg | ORAL_CAPSULE | Freq: Two times a day (BID) | ORAL | Status: DC
  Administered 2014-11-16 – 2014-11-18 (×4): 100 mg via ORAL
  Filled 2014-11-16 (×2): qty 1

## 2014-11-16 MED ORDER — LIDOCAINE HCL (PF) 2 % IJ SOLN
INTRAMUSCULAR | Status: DC | PRN
  Administered 2014-11-16: 20 mg via INTRADERMAL

## 2014-11-16 MED ORDER — MEPERIDINE HCL 50 MG/ML IJ SOLN: 6.2500 mg | INTRAMUSCULAR | Status: DC | PRN

## 2014-11-16 MED ORDER — FENTANYL CITRATE 0.05 MG/ML IJ SOLN
INTRAMUSCULAR | Status: AC
  Filled 2014-11-16: qty 2

## 2014-11-16 MED ORDER — WARFARIN SODIUM 2 MG PO TABS
2.0000 mg | ORAL_TABLET | Freq: Once | ORAL | Status: AC
  Administered 2014-11-16: 2 mg via ORAL
  Filled 2014-11-16: qty 1

## 2014-11-16 MED ORDER — FERROUS SULFATE 325 (65 FE) MG PO TABS
325.0000 mg | ORAL_TABLET | Freq: Three times a day (TID) | ORAL | Status: DC
  Administered 2014-11-17 – 2014-11-18 (×4): 325 mg via ORAL
  Filled 2014-11-16 (×7): qty 1

## 2014-11-16 MED ORDER — PHENOL 1.4 % MT LIQD
1.0000 | OROMUCOSAL | Status: DC | PRN
  Filled 2014-11-16: qty 177

## 2014-11-16 MED ORDER — ENOXAPARIN SODIUM 40 MG/0.4ML ~~LOC~~ SOLN
40.0000 mg | SUBCUTANEOUS | Status: DC
  Administered 2014-11-17 – 2014-11-18 (×2): 40 mg via SUBCUTANEOUS
  Filled 2014-11-16 (×3): qty 0.4

## 2014-11-16 MED ORDER — BISACODYL 10 MG RE SUPP: 10.0000 mg | Freq: Every day | RECTAL | Status: DC | PRN

## 2014-11-16 MED ORDER — SODIUM CHLORIDE 0.9 % IV SOLN
INTRAVENOUS | Status: DC
  Administered 2014-11-17 – 2014-11-18 (×2): via INTRAVENOUS

## 2014-11-16 MED ORDER — EPHEDRINE SULFATE 50 MG/ML IJ SOLN
INTRAMUSCULAR | Status: DC | PRN
  Administered 2014-11-16: 10 mg via INTRAVENOUS

## 2014-11-16 MED ORDER — LORAZEPAM 2 MG/ML IJ SOLN: 0.5000 mg | Freq: Three times a day (TID) | INTRAMUSCULAR | Status: DC | PRN

## 2014-11-16 MED ORDER — MIDAZOLAM HCL 2 MG/2ML IJ SOLN
INTRAMUSCULAR | Status: AC
  Filled 2014-11-16: qty 2

## 2014-11-16 MED ORDER — ONDANSETRON HCL 4 MG/2ML IJ SOLN
INTRAMUSCULAR | Status: AC
  Filled 2014-11-16: qty 2

## 2014-11-16 MED ORDER — LIDOCAINE HCL (CARDIAC) 20 MG/ML IV SOLN
INTRAVENOUS | Status: AC
  Filled 2014-11-16: qty 5

## 2014-11-16 MED ORDER — MENTHOL 3 MG MT LOZG
1.0000 | LOZENGE | OROMUCOSAL | Status: DC | PRN
  Filled 2014-11-16: qty 9

## 2014-11-16 MED ORDER — FENTANYL CITRATE 0.05 MG/ML IJ SOLN
INTRAMUSCULAR | Status: DC | PRN
  Administered 2014-11-16: 100 ug via INTRAVENOUS

## 2014-11-16 MED ORDER — POLYETHYLENE GLYCOL 3350 17 G PO PACK
17.0000 g | PACK | Freq: Every day | ORAL | Status: DC | PRN
  Administered 2014-11-17: 17 g via ORAL
  Filled 2014-11-16: qty 1

## 2014-11-16 MED ORDER — PROMETHAZINE HCL 25 MG/ML IJ SOLN: 6.2500 mg | INTRAMUSCULAR | Status: DC | PRN

## 2014-11-16 MED ORDER — CEFAZOLIN SODIUM-DEXTROSE 2-3 GM-% IV SOLR
2.0000 g | Freq: Once | INTRAVENOUS | Status: AC
  Administered 2014-11-16: 2 g via INTRAVENOUS

## 2014-11-16 MED ORDER — METOCLOPRAMIDE HCL 10 MG PO TABS
5.0000 mg | ORAL_TABLET | Freq: Three times a day (TID) | ORAL | Status: DC | PRN
  Administered 2014-11-18: 10 mg via ORAL
  Filled 2014-11-16: qty 1

## 2014-11-16 MED ORDER — ACETAMINOPHEN 10 MG/ML IV SOLN
1000.0000 mg | Freq: Once | INTRAVENOUS | Status: AC
  Administered 2014-11-16: 1000 mg via INTRAVENOUS
  Filled 2014-11-16: qty 100

## 2014-11-16 MED ORDER — HYDROMORPHONE HCL 1 MG/ML IJ SOLN: 0.5000 mg | INTRAMUSCULAR | Status: DC | PRN

## 2014-11-16 MED ORDER — SUCCINYLCHOLINE CHLORIDE 20 MG/ML IJ SOLN
INTRAMUSCULAR | Status: DC | PRN
  Administered 2014-11-16: 100 mg via INTRAVENOUS

## 2014-11-16 MED ORDER — CEFAZOLIN SODIUM-DEXTROSE 2-3 GM-% IV SOLR
2.0000 g | Freq: Four times a day (QID) | INTRAVENOUS | Status: AC
  Administered 2014-11-17: 2 g via INTRAVENOUS
  Filled 2014-11-16 (×2): qty 50

## 2014-11-16 MED ORDER — PROPOFOL 10 MG/ML IV BOLUS
INTRAVENOUS | Status: AC
  Filled 2014-11-16: qty 20

## 2014-11-16 MED ORDER — CEFAZOLIN SODIUM-DEXTROSE 2-3 GM-% IV SOLR
INTRAVENOUS | Status: AC
  Filled 2014-11-16: qty 50

## 2014-11-16 MED ORDER — HYDROMORPHONE HCL 1 MG/ML IJ SOLN: 0.2500 mg | INTRAMUSCULAR | Status: DC | PRN

## 2014-11-16 MED ORDER — METOCLOPRAMIDE HCL 5 MG/ML IJ SOLN: 5.0000 mg | Freq: Three times a day (TID) | INTRAMUSCULAR | Status: DC | PRN

## 2014-11-16 MED ORDER — PROPOFOL 10 MG/ML IV BOLUS
INTRAVENOUS | Status: DC | PRN
  Administered 2014-11-16: 130 mg via INTRAVENOUS

## 2014-11-16 MED ORDER — 0.9 % SODIUM CHLORIDE (POUR BTL) OPTIME
TOPICAL | Status: DC | PRN
  Administered 2014-11-16: 1000 mL

## 2014-11-16 MED ORDER — ALUM & MAG HYDROXIDE-SIMETH 200-200-20 MG/5ML PO SUSP: 30.0000 mL | ORAL | Status: DC | PRN

## 2014-11-16 MED ORDER — MAGNESIUM CITRATE PO SOLN: 1.0000 | Freq: Once | ORAL | Status: AC | PRN

## 2014-11-16 MED ORDER — WARFARIN - PHARMACIST DOSING INPATIENT
Freq: Every day | Status: DC
Start: 2014-11-16 — End: 2014-11-18

## 2014-11-16 MED ORDER — LACTATED RINGERS IV SOLN
INTRAVENOUS | Status: AC
  Administered 2014-11-16: 1000 mL via INTRAVENOUS

## 2014-11-16 MED ORDER — PANTOPRAZOLE SODIUM 40 MG PO TBEC
40.0000 mg | DELAYED_RELEASE_TABLET | Freq: Every day | ORAL | Status: DC
  Administered 2014-11-16 – 2014-11-18 (×3): 40 mg via ORAL
  Filled 2014-11-16 (×4): qty 1

## 2014-11-16 MED ORDER — ONDANSETRON HCL 4 MG/2ML IJ SOLN
INTRAMUSCULAR | Status: DC | PRN
  Administered 2014-11-16: 4 mg via INTRAVENOUS

## 2014-11-16 SURGICAL SUPPLY — 50 items
BAG SPEC THK2 15X12 ZIP CLS (MISCELLANEOUS) ×1
BAG ZIPLOCK 12X15 (MISCELLANEOUS) ×3 IMPLANT
BLADE SAW SGTL 18X1.27X75 (BLADE) ×2 IMPLANT
BLADE SAW SGTL 18X1.27X75MM (BLADE) ×1
CAPT HIP HEMI 2 ×2 IMPLANT
CLOSURE WOUND 1/2 X4 (GAUZE/BANDAGES/DRESSINGS)
DRAPE INCISE IOBAN 85X60 (DRAPES) ×3 IMPLANT
DRAPE ORTHO SPLIT 77X108 STRL (DRAPES) ×6
DRAPE POUCH INSTRU U-SHP 10X18 (DRAPES) ×3 IMPLANT
DRAPE SURG 17X11 SM STRL (DRAPES) ×3 IMPLANT
DRAPE SURG ORHT 6 SPLT 77X108 (DRAPES) ×2 IMPLANT
DRAPE U-SHAPE 47X51 STRL (DRAPES) ×3 IMPLANT
DRSG AQUACEL AG ADV 3.5X10 (GAUZE/BANDAGES/DRESSINGS) ×3 IMPLANT
DRSG TEGADERM 4X4.75 (GAUZE/BANDAGES/DRESSINGS) ×1 IMPLANT
DURAPREP 26ML APPLICATOR (WOUND CARE) ×3 IMPLANT
ELECT BLADE TIP CTD 4 INCH (ELECTRODE) ×3 IMPLANT
ELECT REM PT RETURN 9FT ADLT (ELECTROSURGICAL) ×3
ELECTRODE REM PT RTRN 9FT ADLT (ELECTROSURGICAL) ×1 IMPLANT
EVACUATOR 1/8 PVC DRAIN (DRAIN) ×3 IMPLANT
FACESHIELD WRAPAROUND (MASK) ×12 IMPLANT
FACESHIELD WRAPAROUND OR TEAM (MASK) ×4 IMPLANT
GAUZE SPONGE 2X2 8PLY STRL LF (GAUZE/BANDAGES/DRESSINGS) ×1 IMPLANT
GLOVE BIOGEL PI IND STRL 7.5 (GLOVE) ×1 IMPLANT
GLOVE BIOGEL PI IND STRL 8.5 (GLOVE) ×1 IMPLANT
GLOVE BIOGEL PI INDICATOR 7.5 (GLOVE) ×2
GLOVE BIOGEL PI INDICATOR 8.5 (GLOVE) ×2
GLOVE ECLIPSE 8.0 STRL XLNG CF (GLOVE) IMPLANT
GLOVE ORTHO TXT STRL SZ7.5 (GLOVE) ×6 IMPLANT
GLOVE SURG ORTHO 8.0 STRL STRW (GLOVE) ×3 IMPLANT
GOWN SPEC L3 XXLG W/TWL (GOWN DISPOSABLE) ×6 IMPLANT
GOWN STRL REUS W/TWL LRG LVL3 (GOWN DISPOSABLE) ×3 IMPLANT
HANDPIECE INTERPULSE COAX TIP (DISPOSABLE)
IMMOBILIZER KNEE 20 (SOFTGOODS)
IMMOBILIZER KNEE 20 THIGH 36 (SOFTGOODS) IMPLANT
KIT BASIN OR (CUSTOM PROCEDURE TRAY) ×3 IMPLANT
LIQUID BAND (GAUZE/BANDAGES/DRESSINGS) ×3 IMPLANT
MANIFOLD NEPTUNE II (INSTRUMENTS) ×3 IMPLANT
PACK TOTAL JOINT (CUSTOM PROCEDURE TRAY) ×3 IMPLANT
POSITIONER SURGICAL ARM (MISCELLANEOUS) ×3 IMPLANT
SET HNDPC FAN SPRY TIP SCT (DISPOSABLE) IMPLANT
SPONGE GAUZE 2X2 STER 10/PKG (GAUZE/BANDAGES/DRESSINGS)
STRIP CLOSURE SKIN 1/2X4 (GAUZE/BANDAGES/DRESSINGS) ×2 IMPLANT
SUT ETHIBOND NAB CT1 #1 30IN (SUTURE) ×3 IMPLANT
SUT MNCRL AB 4-0 PS2 18 (SUTURE) ×3 IMPLANT
SUT VIC AB 1 CT1 36 (SUTURE) ×6 IMPLANT
SUT VIC AB 2-0 CT1 27 (SUTURE) ×6
SUT VIC AB 2-0 CT1 TAPERPNT 27 (SUTURE) ×2 IMPLANT
SUT VLOC 180 0 24IN GS25 (SUTURE) ×2 IMPLANT
TOWEL OR 17X26 10 PK STRL BLUE (TOWEL DISPOSABLE) ×6 IMPLANT
TRAY FOLEY CATH 14FRSI W/METER (CATHETERS) ×1 IMPLANT

## 2014-11-16 NOTE — Progress Notes (Addendum)
Patient ID: Anne Anthony, female   DOB: Dec 21, 1935, 79 y.o.   MRN: [id number]  INR normalized at 1.2 Ready for OR today NPO at MN last night  Reviewed with she and her daughter indications, post-operative course and expectations  To OR later this pm 2-3 days post-operatively prior to d/c to SNF versus home based on activity and safety  She will be weight bearing as tolerated

## 2014-11-16 NOTE — Progress Notes (Signed)
ANTICOAGULATION CONSULT NOTE - Initial Consult  Pharmacy Consult for warfarin Indication: atrial fibrillation and VTE prophylaxis  No Known Allergies  Patient Measurements: Height: 5\' 4"  (162.6 cm) Weight: 107 lb 8 oz (48.762 kg) IBW/kg (Calculated) : 54.7 Heparin Dosing Weight:   Vital Signs: Temp: 97.6 F (36.4 C) (01/04 2100) Temp Source: Oral (01/04 2100) BP: 99/52 mmHg (01/04 2100) Pulse Rate: 70 (01/04 2100)  Labs:  Recent Labs  11/14/14 2044 11/15/14 0540 11/15/14 1531 11/16/14 0500  HGB 11.9* 11.3*  --  10.7*  HCT 37.9 36.8  --  34.0*  PLT 243 206  --  182  LABPROT 31.1* 32.2* 19.2* 15.5*  INR 2.97* 3.11* 1.60* 1.21  CREATININE 1.00 0.93  --  0.82  TROPONINI <0.03  --   --   --     Estimated Creatinine Clearance: 45 mL/min (by C-G formula based on Cr of 0.82).   Medical History: Past Medical History  Diagnosis Date  . Atrial flutter     successful radiofrequency ablation 2007  . Paroxysmal atrial fibrillation   . Tachycardia-bradycardia syndrome     permament pacermaker, dual chamber Medtronic on 06/2011  . Hypertension   . Hyperlipidemia   . Pacemaker   . Tubular adenoma   . Hiatal hernia   . Diverticula of colon   . Family history of colon cancer     brother    Assessment: Anne Anthony admitted with femoral neck fracture s/p repair 1/4.  She takes warfarin for atrial fibrillation which was held for surgery.  Orders to resume post-op   Home warfarin dose: 2mg  daily except 1mg  on Friday (Med history states 4mg  daily per family)  Today, 11/16/2014   INR = 1.21 (received Vit 5mg  IV 1/3)  CBC: Hgb = 10.7 pre-op with pltc WNL  Drug interactions = amiodarone (chronic medication)  Goal of Therapy:  INR 2-3 Monitor platelets by anticoagulation protocol: Yes   Plan:   Coumadin 2mg  PO x 1 tonight  May have reduced requirements if not eating well  D/C enoxaparin when INR >= 1.8  Daily INR  Monitoring for bleeding  Doreene Eland, PharmD,  BCPS.   Pager: [phone]  11/16/2014,10:11 PM

## 2014-11-16 NOTE — Progress Notes (Signed)
[facility] PROGRESS NOTE  Anne Anthony WEX:[id number] DOB: 06-May-1936 DOA: 11/14/2014 PCP: Purvis Kilts, MD  Assessment/Plan: #1 left femur fracture Secondary to mechanical fall. Patient has been seen by orthopedics. Patient's INR is reversed. Patient for surgery today. Per orthopedics.  #2 atrial fibrillation Currently rate controlled on IV metoprolol and oral amiodarone. Coumadin on hold in preparation for probable surgery. INR is reversed and at elevated at 1.21. Continue to hold Coumadin.  #3 history of tachybradycardia syndrome status post PPM  #4 microcytic anemia Follow H&H. Outpatient follow-up.  # 5 hyperlipidemia Continue statin.  #6 gastroesophageal reflux disease PPI.  #7 prophylaxis PPI for GI prophylaxis. SCDs for DVT prophylaxis.  Code Status: Full Family Communication: Updated patient and daughter at bedside. Disposition Plan: Likely SNF pending surgery.   Consultants:  Orthopedics: Merla Riches, [place] 11/15/2014  Procedures:  Chest x-ray 11/14/2014  X-ray of the left hip 11/14/2014  Antibiotics:  None  HPI/Subjective: Patient states left hip pain controlled. Patient denies any chest pain. No shortness of breath.  Objective: Filed Vitals:   11/16/14 0500  BP: 147/67  Pulse: 70  Temp: 98.8 F (37.1 C)  Resp: 16    Intake/Output Summary (Last 24 hours) at 11/16/14 1109 Last data filed at 11/16/14 0925  Gross per 24 hour  Intake 1407.5 ml  Output   1150 ml  Net  257.5 ml   Filed Weights   11/14/14 2347  Weight: 48.762 kg (107 lb 8 oz)    Exam:   General:  NAD  Cardiovascular: RRR  Respiratory: CTAB anterior lung fields  Abdomen: Soft, nontender, nondistended, positive bowel sounds.  Musculoskeletal: No clubbing cyanosis or edema.  Data Reviewed: Basic Metabolic Panel:  Recent Labs Lab 11/14/14 2044 11/15/14 0540 11/16/14 0500  NA 138 134* 133*  K 4.2 4.2 4.2  CL 104 104 101  CO2 20 27 25   GLUCOSE  141* 111* 94  BUN 19 21 17   CREATININE 1.00 0.93 0.82  CALCIUM 8.3* 8.3* 8.5   Liver Function Tests:  Recent Labs Lab 11/14/14 2044 11/15/14 0540  AST 22 18  ALT 16 15  ALKPHOS 47 45  BILITOT 0.8 0.6  PROT 6.7 6.3  ALBUMIN 3.7 3.4*   No results for input(s): LIPASE, AMYLASE in the last 168 hours. No results for input(s): AMMONIA in the last 168 hours. CBC:  Recent Labs Lab 11/14/14 2044 11/15/14 0540 11/16/14 0500  WBC 11.3* 9.4 9.1  NEUTROABS 8.9* 7.0  --   HGB 11.9* 11.3* 10.7*  HCT 37.9 36.8 34.0*  MCV 105.9* 107.0* 106.6*  PLT 243 206 182   Cardiac Enzymes:  Recent Labs Lab 11/14/14 2044  TROPONINI <0.03   BNP (last 3 results)  Recent Labs  06/12/14 1918  PROBNP 857.5*   CBG: No results for input(s): GLUCAP in the last 168 hours.  No results found for this or any previous visit (from the past 240 hour(s)).   Studies: Dg Chest 1 View  11/14/2014   CLINICAL DATA:  Status post two falls, yesterday and today. Concern for chest injury. Initial encounter.  EXAM: CHEST - 1 VIEW  COMPARISON:  Chest radiograph and CTA of the chest performed 06/12/2014  FINDINGS: The lungs are well-aerated. Mild vascular congestion is noted. No pleural effusion or pneumothorax is seen. Mild scarring is noted at the lung apices.  The cardiomediastinal silhouette is borderline normal in size. A pacemaker is noted overlying the left chest wall, with leads ending overlying the right atrium and  right ventricle. The patient's very large hiatal hernia is partially filled with solid material, with surrounding atelectasis. No acute osseous abnormalities are seen.  IMPRESSION: 1. No displaced rib fracture seen. 2. Mild vascular congestion noted. Mild scarring at the lung apices. 3. Very large hiatal hernia is partially filled with solid material, with surrounding atelectasis.   Electronically Signed   By: Garald Balding M.D.   On: 11/14/2014 20:28   Dg Hip Complete Left  11/14/2014   CLINICAL  DATA:  Initial encounter for falls yesterday and today in a pop in her hip while walking in her house today.  EXAM: LEFT HIP - COMPLETE 2+ VIEW  COMPARISON:  02/28/2014  FINDINGS: Bones are diffusely demineralized. No evidence for sacral fracture. Symphysis pubis is unremarkable.  AP and cross-table lateral views of the left hip show a subcapital femoral neck fracture with bony over riding and varus angulation.  IMPRESSION: Left femoral neck fracture with varus angulation.   Electronically Signed   By: Misty Stanley M.D.   On: 11/14/2014 20:29    Scheduled Meds: . amiodarone  200 mg Oral Daily  . escitalopram  5 mg Oral Daily  . gabapentin  300 mg Oral QHS  . metoprolol  5 mg Intravenous 3 times per day  . pantoprazole  40 mg Oral Daily  . simvastatin  20 mg Oral QPM   Continuous Infusions: . sodium chloride 75 mL/hr at 11/15/14 2357    Principal Problem:   Closed left hip fracture Active Problems:   Tachycardia-bradycardia syndrome   Hyperlipidemia   Chronic atrial fibrillation   Left displaced femoral neck fracture    Time spent: 68 minutes    THOMPSON,DANIEL M.D. [facility] Pager [phone]. If 7PM-7AM, please contact night-coverage at [email or web address], password [facility] 11/16/2014, 11:09 AM  LOS: 2 days

## 2014-11-16 NOTE — Op Note (Signed)
NAME:  Anne Anthony, Anne Anthony                ACCOUNT NO.:  [id number]   MEDICAL RECORD NO.: [id number]   LOCATION:  0160                         FACILITY:  [facility]   DATE OF BIRTH:  2035-11-28  PHYSICIAN:  Pietro Cassis. Alvan Dame, M.D.     DATE OF PROCEDURE:  11/16/14                               OPERATIVE REPORT     PREOPERATIVE DIAGNOSIS:  Left displaced femoral neck fracture.   POSTOPERATIVE DIAGNOSIS:  Left displaced femoral neck fracture.   PROCEDURE:  Left hip hemiarthroplasty utilizing DePuy component, size 5  standard Tri-Lock stem with a 56mm unipolar ball with a +0 adapter.   SURGEON:  Pietro Cassis. Alvan Dame, MD   ASSISTANT:  Danae Orleans, PA-C.   ANESTHESIA:  General.   SPECIMENS:  None.   DRAINS:  One medium Hemovac.   BLOOD LOSS:  About 100 cc.   COMPLICATIONS:  None.   INDICATION OF PROCEDURE:  Anne Anthony is a pleasant 79 year old female who lives independently.  She unfortunately had a couple falls at her house within the few days prior to admission.  She recognized a pop in her hip before she lost the ability to stand and went to the ground.  She was admitted to the hospital after radiographs revealed a femoral neck fracture.  She takes Coumadin for anticoagulation for atrial fibrillation.  She required use of Vitamin K in order to normalize an INR from 3.11 to 1.2  She was seen and evaluated and was scheduled for surgery for fixation.  The necessity of surgical repair was discussed with she and her family.  Consent was obtained after reviewing risks of infection, DVT, component failure, and need for revision surgery.   PROCEDURE IN DETAIL:  The patient was brought to the operative theater. Once adequate anesthesia, preoperative antibiotics, 2 g of Ancef administered, the patient was positioned into the right lateral decubitus position with the left side up.  The left lower extremity was then prepped and draped in sterile fashion.  A time-out was performed identifying  the patient, planned procedure, and extremity.   A lateral incision was made off the proximal trochanter. Sharp dissection was carried down to the iliotibial band and gluteal fascia. The gluteal fascia was then incised for posterior approach.  The short external rotators were taken down separate from the posterior capsule. An L capsulotomy was made preserving the posterior leaflet for later anatomic repair. Fracture site was identified and after removing comminuted segments of the posterior femoral neck, the femoral head was removed without difficulty and measured on the back table  using the sizing rings and determined to be 46 mm in diameter.   The proximal femur was then exposed.  Retractors placed.  I then drilled, opened the proximal femur.  Then I hand reamed once and  Irrigated the canal to try to prevent fat emboli.  I began broaching the femur with a starting broach up to a size 5 broach with good medial and lateral metaphyseal fit without evidence of any torsion or movement.  A trial reduction was carried out with a standard neck and a +0 adapter with a 83mm head ball.  The hip reduced nicely.  The leg lengths appeared to be equal compared to the down leg.   The hip went through a range of motion without evidence of any subluxation or impingement.   Given these findings, the trial components removed.  The final 5 standardTri-Lock stem was opened.  After irrigating the canal, the final stem was impacted and sat at the level where the broach was. Based on this and the trial reduction, a +0 adapter was opened and impacted in the 61mm unipolar ball onto a clean and dry trunnion.  The hip had been irrigated throughout the case and again at this point.  I re- Approximated the posterior capsule to the superior leaflet using a  #1 Vicryl.  The remainder of the wound was closed with #1 Vicryl and #0 V-lock sutures in the iliotibial band and gluteal fascia, a  2-0 Vicryl in the sub-Q  tissue and a running 4-0 Monocryl in the skin.  The hip was cleaned, dried, and dressed sterilely using Dermabond and Aquacel dressing.  She was then brought to recovery room in stable condition, tolerating the procedure well.  Danae Orleans, PA-C was present and utilized as Environmental consultant for the entire case from  Preoperative positioning to management of the contralateral extremity and retractors to  General facilitation of the procedure.  He was also involved with primary wound closure.         Pietro Cassis Alvan Dame, M.D.

## 2014-11-16 NOTE — Anesthesia Preprocedure Evaluation (Addendum)
Anesthesia Evaluation  Patient identified by MRN, date of birth, ID band Patient awake    Reviewed: Allergy & Precautions, NPO status , Patient's Chart, lab work & pertinent test results  Airway Mallampati: II  TM Distance: >3 FB Neck ROM: Full    Dental no notable dental hx.    Pulmonary neg pulmonary ROS,  breath sounds clear to auscultation  Pulmonary exam normal       Cardiovascular hypertension, negative cardio ROS  + pacemaker Rhythm:Regular Rate:Normal  Pacer Interrogated 10/2014 [employer], DDDR. 94% AP-VS, 5% AS-VS, 1% AP-VP  Echo 06/2014 - Left ventricle: The cavity size was normal. Wall thickness wasnormal. Systolic function was normal. The estimated ejectionfraction was in the range of 50% to 55%. Wall motion was normal;there were no regional wall motion abnormalities. Leftventricular diastolic function parameters were normal. - Left atrium: The atrium was mildly dilated. - Pericardium, extracardiac: A trivial pericardial effusion wasidentified.   Neuro/Psych negative neurological ROS  negative psych ROS   GI/Hepatic Neg liver ROS, hiatal hernia, GERD-  ,  Endo/Other  negative endocrine ROS  Renal/GU negative Renal ROS     Musculoskeletal negative musculoskeletal ROS (+)   Abdominal   Peds  Hematology negative hematology ROS (+)   Anesthesia Other Findings   Reproductive/Obstetrics negative OB ROS                          Anesthesia Physical Anesthesia Plan  ASA: III  Anesthesia Plan: General   Post-op Pain Management:    Induction: Intravenous  Airway Management Planned: Oral ETT  Additional Equipment:   Intra-op Plan:   Post-operative Plan: Extubation in OR  Informed Consent: I have reviewed the patients History and Physical, chart, labs and discussed the procedure including the risks, benefits and alternatives for the proposed anesthesia with the patient  or authorized representative who has indicated his/her understanding and acceptance.   Dental advisory given  Plan Discussed with: CRNA  Anesthesia Plan Comments:         Anesthesia Quick Evaluation

## 2014-11-16 NOTE — Progress Notes (Deleted)
Clinical Social Work Department BRIEF PSYCHOSOCIAL ASSESSMENT 11/16/2014  Patient:  Anne Anthony, Anne Anthony     Account Number:  [id number]     Admit date:  11/14/2014  Clinical Social Worker:  Lacie Scotts  Date/Time:  11/16/2014 03:38 PM  Referred by:  Physician  Date Referred:  11/16/2014 Referred for  SNF Placement   Other Referral:   Interview type:  Patient Other interview type:    PSYCHOSOCIAL DATA Living Status:  HUSBAND Admitted from facility:   Level of care:   Primary support name:  Thayer Jew Primary support relationship to patient:  SPOUSE Degree of support available:   supportive    CURRENT CONCERNS Current Concerns  Post-Acute Placement   Other Concerns:    SOCIAL WORK ASSESSMENT / PLAN Pt is a 79 yr old female living at home with spouse prior to hospitalization. CSW consulted for SNF placement. PN reviewed. Pt fell at home and fx her hip. Surgery has been completed. PT is recommending ST rehab / 24 /7 supervision. CSW met with pt to offer assistance with rehab placement. Pt is willing to consider this option but would prefer to return home following hospital d/c. Pt allowed csw to initiated SNF search and bed offers her been reviewed. Pt will review options with spouse and contact csw with their decision.   Assessment/plan status:  Psychosocial Support/Ongoing Assessment of Needs Other assessment/ plan:   Information/referral to community resources:   Insurance coverage for SNF and ambulance transport reviewed.    PATIENT'S/FAMILY'S RESPONSE TO PLAN OF CARE: Pt reports that spouse is retired and available to provide 24/7 support. Benefits of rehab vs HHPT reviewed with pt . Pt will consider rehab option. CSW is available to assist with placement if pt chooses this plan.    Werner Lean LCSW [phone]

## 2014-11-16 NOTE — Progress Notes (Deleted)
Clinical Social Work Department CLINICAL SOCIAL WORK PLACEMENT NOTE 11/16/2014  Patient:  Anne Anthony, Anne Anthony  Account Number:  [id number] Admit date:  11/14/2014  Clinical Social Worker:  Werner Lean, LCSW  Date/time:  11/16/2014 03:50 PM  Clinical Social Work is seeking post-discharge placement for this patient at the following level of care:   SKILLED NURSING   (*CSW will update this form in Epic as items are completed)   11/16/2014  Patient/family provided with [place] Department of Clinical Social Work's list of facilities offering this level of care within the geographic area requested by the patient (or if unable, by the patient's family).  11/16/2014  Patient/family informed of their freedom to choose among providers that offer the needed level of care, that participate in Medicare, Medicaid or managed care program needed by the patient, have an available bed and are willing to accept the patient.    Patient/family informed of [facility]' ownership interest in [facility], as well as of the fact that they are under no obligation to receive care at this facility.  PASARR submitted to EDS on 11/16/2014 PASARR number received on 11/16/2014  FL2 transmitted to all facilities in geographic area requested by pt/family on  11/16/2014 FL2 transmitted to all facilities within larger geographic area on   Patient informed that his/her managed care company has contracts with or will negotiate with  certain facilities, including the following:     Patient/family informed of bed offers received:  11/16/2014 Patient chooses bed at  Physician recommends and patient chooses bed at    Patient to be transferred to  on   Patient to be transferred to facility by  Patient and family notified of transfer on  Name of family member notified:    The following physician request were entered in Epic:   Additional Comments:  Werner Lean LCSW [phone]

## 2014-11-16 NOTE — Clinical Documentation Improvement (Signed)
  Per 1/4 Registered Dietician eval patient "meets criteria for Severe protein calorie malnutrition in the context of acute illness/injury as evidenced by severe muscle depletion and 9% weight loss x 4 months". If you agree with "Severe PCM" please add to documentation to illustrate severity of illness and risk of mortality.  Marland Kitchen BMI 18.44 . currently is 89% of ideal body weight . lost 11 lbs since 8/15 . Muscle Wasting: Temple Region: severe depletion  Clavicle Bone Region: moderate depletion  Clavicle and Acromion Bone Region: moderate depletion  Scapular Bone Region: moderate depletion  Dorsal Hand: moderate depletion  Thank Dennis Bast Barrie Dunker RN CDIS [phone] HIM department

## 2014-11-16 NOTE — Anesthesia Postprocedure Evaluation (Signed)
Anesthesia Post Note  Patient: Anne Anthony  Procedure(s) Performed: Procedure(s) (LRB): ARTHROPLASTY BIPOLAR HIP (Left)  Anesthesia type: General  Patient location: PACU  Post pain: Pain level controlled  Post assessment: Post-op Vital signs reviewed  Last Vitals: BP 124/58 mmHg  Pulse 78  Temp(Src) 37.1 C (Oral)  Resp 15  Ht 5\' 4"  (1.626 m)  Wt 107 lb 8 oz (48.762 kg)  BMI 18.44 kg/m2  SpO2 94%  Post vital signs: Reviewed  Level of consciousness: sedated  Complications: No apparent anesthesia complications

## 2014-11-16 NOTE — Transfer of Care (Signed)
Immediate Anesthesia Transfer of Care Note  Patient: Anne Anthony  Procedure(s) Performed: Procedure(s) (LRB): ARTHROPLASTY BIPOLAR HIP (Left)  Patient Location: PACU  Anesthesia Type: General  Level of Consciousness: sedated, patient cooperative and responds to stimulation  Airway & Oxygen Therapy: Patient Spontanous Breathing and Patient connected to face mask oxgen  Post-op Assessment: Report given to PACU RN and Post -op Vital signs reviewed and stable  Post vital signs: Reviewed and stable  Complications: No apparent anesthesia complications

## 2014-11-16 NOTE — Consult Note (Signed)
This note is from my evaluation of her 1/3 am   Reason for Consult: Left hip fracture Referring Physician:  Grandville Silos, MD  Anne Anthony is an 79 y.o. female.  HPI:  79 yo female admitted to hospital after hearing her left hip pop then falling due to inability to bear weight.  She had had a fall 2 days prior and noticed initially doing ok but then her left leg feeling worse.  No other injuries to report other than general soreness.  History of a-fib on coumadin (admitting INR - 3.11)  Past Medical History  Diagnosis Date  . Atrial flutter     successful radiofrequency ablation 2007  . Paroxysmal atrial fibrillation   . Tachycardia-bradycardia syndrome     permament pacermaker, dual chamber Medtronic on 06/2011  . Hypertension   . Hyperlipidemia   . Pacemaker   . Tubular adenoma   . Hiatal hernia   . Diverticula of colon   . Family history of colon cancer     brother    Past Surgical History  Procedure Laterality Date  . Permament pacemaker  06/2011    dual chamber , Medtronic Adapta serial # [id number]  last checked 02/05/2013  . Hiatal hernia repair  2005  . Radiofrequency ablation  06/28/2006  . Colonoscopy  08/17/2010    Dr. Gala Romney- internal hemorrhoids, o/w normal rectum, L sided diverticula, tubular adenoma  . Esophagogastroduodenoscopy  10/502011    Dr. Gala Romney- huge diaphragmatic/hiatial hernia, fundal gland polyps not manipulated o/w noral appearing gastric mucosa, patent pylorus, normal D1 & D2  . Partial hysterectomy    . Abdominal surgery      Family History  Problem Relation Age of Onset  . Diabetes Mother     Social History:  reports that she has never smoked. She has never used smokeless tobacco. She reports that she does not drink alcohol or use illicit drugs.  Allergies: No Known Allergies  Medications:  I have reviewed the patient's current medications. Scheduled: . amiodarone  200 mg Oral Daily  . docusate sodium  100 mg Oral BID  . escitalopram   5 mg Oral Daily  . gabapentin  300 mg Oral QHS  . metoprolol  5 mg Intravenous 3 times per day  . pantoprazole  40 mg Oral Daily  . simvastatin  20 mg Oral QPM    Results for orders placed or performed during the hospital encounter of 11/14/14 (from the past 24 hour(s))  Protime-INR     Status: Abnormal   Collection Time: 11/15/14  3:31 PM  Result Value Ref Range   Prothrombin Time 19.2 (H) 11.6 - 15.2 seconds   INR 1.60 (H) 0.00 - 4.09  Basic metabolic panel     Status: Abnormal   Collection Time: 11/16/14  5:00 AM  Result Value Ref Range   Sodium 133 (L) 135 - 145 mmol/L   Potassium 4.2 3.5 - 5.1 mmol/L   Chloride 101 96 - 112 mEq/L   CO2 25 19 - 32 mmol/L   Glucose, Bld 94 70 - 99 mg/dL   BUN 17 6 - 23 mg/dL   Creatinine, Ser 0.82 0.50 - 1.10 mg/dL   Calcium 8.5 8.4 - 10.5 mg/dL   GFR calc non Af Amer 68 (L) >90 mL/min   GFR calc Af Amer 79 (L) >90 mL/min   Anion gap 7 5 - 15  CBC     Status: Abnormal   Collection Time: 11/16/14  5:00 AM  Result Value  Ref Range   WBC 9.1 4.0 - 10.5 K/uL   RBC 3.19 (L) 3.87 - 5.11 MIL/uL   Hemoglobin 10.7 (L) 12.0 - 15.0 g/dL   HCT 34.0 (L) 36.0 - 46.0 %   MCV 106.6 (H) 78.0 - 100.0 fL   MCH 33.5 26.0 - 34.0 pg   MCHC 31.5 30.0 - 36.0 g/dL   RDW 14.6 11.5 - 15.5 %   Platelets 182 150 - 400 K/uL  Protime-INR     Status: Abnormal   Collection Time: 11/16/14  5:00 AM  Result Value Ref Range   Prothrombin Time 15.5 (H) 11.6 - 15.2 seconds   INR 1.21 0.00 - 1.49    X-ray: CLINICAL DATA: Initial encounter for falls yesterday and today in a pop in her hip while walking in her house today.  EXAM: LEFT HIP - COMPLETE 2+ VIEW  COMPARISON: 02/28/2014  FINDINGS: Bones are diffusely demineralized. No evidence for sacral fracture. Symphysis pubis is unremarkable.  AP and cross-table lateral views of the left hip show a subcapital femoral neck fracture with bony over riding and varus angulation.  IMPRESSION: Left femoral neck  fracture with varus angulation.   Electronically Signed  By: Misty Stanley M.D.  On: 11/14/2014 20:29  ROS  Denies any shortness of breath fever chills. Denies hitting her head. Patient has had recent surgery for hiatal hernia 2 months ago and her PEG tube which was placed for her hiatal hernia was just removed last week  Other than reporting and history and above comment no other pertinent findings or concerns  Blood pressure 147/67, pulse 70, temperature 98.8 F (37.1 C), temperature source Oral, resp. rate 16, height 5\' 4"  (1.626 m), weight 48.762 kg (107 lb 8 oz), SpO2 97 %.  Physical Exam  Resting comfortable Awake alert  Left lower extremity NVI, pain with movement, limited due to radiographic findings  B UE normal Right leg normal  General medical exam reviewed   Assessment/Plan: Displaced left femoral neck fracture Plan for left hip hemiarthroplasty once INR normalized Regular diet for now, will change orders Ordered Vitamin K IV yesterday and will recheck INR 1/3 pm  Giacomo Valone D 11/16/2014, 1:13 PM

## 2014-11-16 NOTE — Progress Notes (Signed)
Clinical Social Work Department CLINICAL SOCIAL WORK PLACEMENT NOTE 11/16/2014  Patient:  GIZZELLE, LACOMB  Account Number:  [id number] Admit date:  11/14/2014  Clinical Social Worker:  Werner Lean, LCSW  Date/time:  11/16/2014 04:39 PM  Clinical Social Work is seeking post-discharge placement for this patient at the following level of care:   SKILLED NURSING   (*CSW will update this form in Epic as items are completed)   11/16/2014  Patient/family provided with [facility] [place] Department of Clinical Social Work's list of facilities offering this level of care within the geographic area requested by the patient (or if unable, by the patient's family).  11/16/2014  Patient/family informed of their freedom to choose among providers that offer the needed level of care, that participate in Medicare, Medicaid or managed care program needed by the patient, have an available bed and are willing to accept the patient.  11/16/2014  Patient/family informed of [facility]' ownership interest in [facility], as well as of the fact that they are under no obligation to receive care at this facility.  PASARR submitted to EDS on 11/16/2014 PASARR number received on 11/16/2014  FL2 transmitted to all facilities in geographic area requested by pt/family on  11/16/2014 FL2 transmitted to all facilities within larger geographic area on   Patient informed that his/her managed care company has contracts with or will negotiate with  certain facilities, including the following:     Patient/family informed of bed offers received:   Patient chooses bed at  Physician recommends and patient chooses bed at    Patient to be transferred to  on   Patient to be transferred to facility by  Patient and family notified of transfer on  Name of family member notified:    The following physician request were entered in Epic:   Additional Comments:  Werner Lean LCSW [phone]

## 2014-11-16 NOTE — Progress Notes (Signed)
INITIAL NUTRITION ASSESSMENT  DOCUMENTATION CODES Per approved criteria  -Severe malnutrition in the context of acute illness or injury -Underweight  Pt meets criteria for severe MALNUTRITION in the context of acute illness/injury as evidenced by severe muscle depletion and 9% weight loss x 4 months.   INTERVENTION: -Diet advancement per MD -When diet is advanced, provide daily snacks. -Provide Magic cup BID with meals, each supplement provides 290 kcal and 9 grams of protein -RD to continue to monitor  NUTRITION DIAGNOSIS: Increased nutrient (protein) needs related to healing for hip fracture as evidenced by estimated nutritional needs.   Goal: Pt to meet >/= 90% of their estimated nutrition needs   Monitor:  Diet advancement, weight, labs, I/O's  Reason for Assessment: Consulted via Hip Fracture Protocol  Admitting Dx: Closed left hip fracture  ASSESSMENT: 79 y.o. female with history of atrial fibrillation, history of tachybradycardia syndrome status post pacemaker placement, hyperlipidemia who has had a recent surgery for hiatal hernia at [facility] 2 months ago was brought to the ER after patient had a fall yesterday.  Pt and daughter in room during visit. Per daughter, pt's PO intake has been slowly improving since hiatal hernia surgery 2 months ago. PO intake: 95%  Per weight history documentation, pt has lost 11 lb since 8/27 (9% weight loss x 4 months, significant for time frame).  Pt currently NPO for surgery scheduled today.  Pt states that she does not like Ensure/Boost supplements. Likes the Ensure clear supplements but not [employer] which we provide. Pt's daughter states that she has ordered the pt the supplements that she will drink at home. Pt would like to receive daily snacks after her diet is advanced. RD to order these + magic cups BID.  Nutrition Focused Physical Exam:  Subcutaneous Fat:  Orbital Region: WNL Upper Arm Region: moderate  depletion Thoracic and Lumbar Region: NA  Muscle:  Temple Region: severe depletion Clavicle Bone Region: moderate depletion Clavicle and Acromion Bone Region: moderate depletion Scapular Bone Region: moderate depletion Dorsal Hand: moderate depletion Patellar Region: NA Anterior Thigh Region: NA Posterior Calf Region: NA  Edema: no LE edema  Labs reviewed: Low Na  Height: Ht Readings from Last 1 Encounters:  11/14/14 5\' 4"  (1.626 m)    Weight: Wt Readings from Last 1 Encounters:  11/14/14 107 lb 8 oz (48.762 kg)    Ideal Body Weight: 120 lb  % Ideal Body Weight: 89%  Wt Readings from Last 10 Encounters:  11/14/14 107 lb 8 oz (48.762 kg)  10/13/14 109 lb (49.442 kg)  07/09/14 118 lb (53.524 kg)  07/07/14 117 lb 6.4 oz (53.252 kg)  06/15/14 117 lb 4.8 oz (53.207 kg)  05/14/14 121 lb (54.885 kg)  03/12/14 124 lb 3.2 oz (56.337 kg)  02/18/14 126 lb (57.153 kg)  07/22/13 134 lb 9.6 oz (61.054 kg)  04/15/13 130 lb 11.2 oz (59.285 kg)    Usual Body Weight: 130 lb per pt  % Usual Body Weight: 82%  BMI:  Body mass index is 18.44 kg/(m^2).  Estimated Nutritional Needs: Kcal: 1500-1700 Protein: 70-80g Fluid: 1.5L/day  Skin: abdominal incision  Diet Order: Diet NPO time specified  EDUCATION NEEDS: -No education needs identified at this time   Intake/Output Summary (Last 24 hours) at 11/16/14 0927 Last data filed at 11/16/14 0925  Gross per 24 hour  Intake 1567.5 ml  Output   1350 ml  Net  217.5 ml    Last BM: 1/2  Labs:   Recent  Labs Lab 11/14/14 2044 11/15/14 0540 11/16/14 0500  NA 138 134* 133*  K 4.2 4.2 4.2  CL 104 104 101  CO2 20 27 25   BUN 19 21 17   CREATININE 1.00 0.93 0.82  CALCIUM 8.3* 8.3* 8.5  GLUCOSE 141* 111* 94    CBG (last 3)  No results for input(s): GLUCAP in the last 72 hours.  Scheduled Meds: . amiodarone  200 mg Oral Daily  . escitalopram  5 mg Oral Daily  . gabapentin  300 mg Oral QHS  . metoprolol  5 mg  Intravenous 3 times per day  . pantoprazole  40 mg Oral Daily  . simvastatin  20 mg Oral QPM    Continuous Infusions: . sodium chloride 75 mL/hr at 11/15/14 2357    Past Medical History  Diagnosis Date  . Atrial flutter     successful radiofrequency ablation 2007  . Paroxysmal atrial fibrillation   . Tachycardia-bradycardia syndrome     permament pacermaker, dual chamber Medtronic on 06/2011  . Hypertension   . Hyperlipidemia   . Pacemaker   . Tubular adenoma   . Hiatal hernia   . Diverticula of colon   . Family history of colon cancer     brother    Past Surgical History  Procedure Laterality Date  . Permament pacemaker  06/2011    dual chamber , Medtronic Adapta serial # [id number]  last checked 02/05/2013  . Hiatal hernia repair  2005  . Radiofrequency ablation  06/28/2006  . Colonoscopy  08/17/2010    Dr. Gala Romney- internal hemorrhoids, o/w normal rectum, L sided diverticula, tubular adenoma  . Esophagogastroduodenoscopy  10/502011    Dr. Gala Romney- huge diaphragmatic/hiatial hernia, fundal gland polyps not manipulated o/w noral appearing gastric mucosa, patent pylorus, normal D1 & D2  . Partial hysterectomy    . Abdominal surgery      Clayton Bibles, MS, RD, LDN Pager: [phone] After Hours Pager: [phone]

## 2014-11-16 NOTE — Progress Notes (Signed)
Clinical Social Work Department BRIEF PSYCHOSOCIAL ASSESSMENT 11/16/2014  Patient:  Anne Anthony, Anne Anthony     Account Number:  [id number]     Admit date:  11/14/2014  Clinical Social Worker:  Lacie Scotts  Date/Time:  11/16/2014 04:27 PM  Referred by:  Physician  Date Referred:  11/16/2014 Referred for  SNF Placement   Other Referral:   Interview type:  Family Other interview type:    PSYCHOSOCIAL DATA Living Status:  ALONE Admitted from facility:   Level of care:   Primary support name:  Anne Anthony / Anne Anthony Primary support relationship to patient:  FAMILY Degree of support available:   Pt's daughter's listed above. Both supportive.    CURRENT CONCERNS Current Concerns  Post-Acute Placement   Other Concerns:    SOCIAL WORK ASSESSMENT / PLAN Pt is a 79 yr old female living at home prior to hospitalization. CSW consulted for possible SNF placement. PN reviewed. Pt sleeping soundly during csw's visit this am. CSW met with daughter to assist with d/c planning. Pt presently in surgery for hip fx. Daughter reports pt will have family with her at home following hospital d/c. Pt may need ST Rehab depending on her progress with therapy. Daughter allowed csw to initiate SNF search. Bed offers are pending. CSW will continue to follow to assist with d/c planning.   Assessment/plan status:  Psychosocial Support/Ongoing Assessment of Needs Other assessment/ plan:   Information/referral to community resources:   insurance coverage for SNF and ambulance transport reviewed.    PATIENT'S/FAMILY'S RESPONSE TO PLAN OF CARE: D/c plans are unclear at this time. CSW will meet again with pt / family following PT eval. Bed offers will be reviewed, at that time, if rehab is recommended.    Werner Lean LCSW [phone]

## 2014-11-17 ENCOUNTER — Encounter (HOSPITAL_COMMUNITY): Payer: Self-pay | Admitting: Orthopedic Surgery

## 2014-11-17 DIAGNOSIS — E43 Unspecified severe protein-calorie malnutrition: Secondary | ICD-10-CM

## 2014-11-17 HISTORY — DX: Unspecified severe protein-calorie malnutrition: E43

## 2014-11-17 LAB — BASIC METABOLIC PANEL
ANION GAP: 7 (ref 5–15)
BUN: 17 mg/dL (ref 6–23)
CHLORIDE: 101 meq/L (ref 96–112)
CO2: 25 mmol/L (ref 19–32)
CREATININE: 1.05 mg/dL (ref 0.50–1.10)
Calcium: 8.7 mg/dL (ref 8.4–10.5)
GFR calc Af Amer: 58 mL/min — ABNORMAL LOW (ref >90)
GFR calc non Af Amer: 50 mL/min — ABNORMAL LOW (ref >90)
Glucose, Bld: 105 mg/dL — ABNORMAL HIGH (ref 70–99)
POTASSIUM: 4.3 mmol/L (ref 3.5–5.1)
Sodium: 133 mmol/L — ABNORMAL LOW (ref 135–145)

## 2014-11-17 LAB — CBC
HCT: 32.8 % — ABNORMAL LOW (ref 36.0–46.0)
Hemoglobin: 10.2 g/dL — ABNORMAL LOW (ref 12.0–15.0)
MCH: 33.2 pg (ref 26.0–34.0)
MCHC: 31.1 g/dL (ref 30.0–36.0)
MCV: 106.8 fL — ABNORMAL HIGH (ref 78.0–100.0)
PLATELETS: 194 10*3/uL (ref 150–400)
RBC: 3.07 MIL/uL — ABNORMAL LOW (ref 3.87–5.11)
RDW: 14.6 % (ref 11.5–15.5)
WBC: 10.5 10*3/uL (ref 4.0–10.5)

## 2014-11-17 LAB — VITAMIN D 25 HYDROXY (VIT D DEFICIENCY, FRACTURES): Vit D, 25-Hydroxy: 36 ng/mL (ref 30–100)

## 2014-11-17 LAB — PROTIME-INR
INR: 1.16 (ref 0.00–1.49)
Prothrombin Time: 14.9 seconds (ref 11.6–15.2)

## 2014-11-17 MED ORDER — TRAMADOL HCL 50 MG PO TABS: 50.0000 mg | ORAL_TABLET | Freq: Four times a day (QID) | ORAL | Status: DC | PRN

## 2014-11-17 MED ORDER — WARFARIN SODIUM 2 MG PO TABS
2.0000 mg | ORAL_TABLET | Freq: Once | ORAL | Status: AC
  Administered 2014-11-17: 2 mg via ORAL
  Filled 2014-11-17: qty 1

## 2014-11-17 MED ORDER — ACETAMINOPHEN 325 MG PO TABS
650.0000 mg | ORAL_TABLET | ORAL | Status: DC | PRN
  Administered 2014-11-18: 650 mg via ORAL
  Filled 2014-11-17: qty 2

## 2014-11-17 NOTE — Evaluation (Signed)
Occupational Therapy Evaluation Patient Details Name: Anne Anthony MRN: [id number] DOB: [birth year] Today's Date: 11/17/2014    History of Present Illness 79 yo female adm 11/14/14 with hip fx and underwent L hip hemiarthroplasty 11/16/14 per Dr. Alvan Dame; PMHx: HTN, pacemaker   Clinical Impression   Pt sustained a fall and underwent L hemiarthroplasty with posterior THPs.  She will benefit from skilled OT to increase safety and independence with ADLs following precautions.  Goals in acute are for min to mod A.  She currently needs mod to max A, +2 assistance.  Pt was mod I prior to admission and lived alone.    Follow Up Recommendations  SNF    Equipment Recommendations  3 in 1 bedside comode    Recommendations for Other Services       Precautions / Restrictions Precautions Precautions: Fall Precaution Comments: reviewed posterior THPs and sign hung in room Restrictions Weight Bearing Restrictions: No Other Position/Activity Restrictions: WBAT LLE      Mobility Bed Mobility Overal bed mobility: Needs Assistance Bed Mobility: Supine to Sit     Supine to sit: Mod assist;Max assist Sit to supine: +2 for physical assistance;Mod assist   General bed mobility comments: cues for technique.  Assist with LEs and guided trunk  Transfers Overall transfer level: Needs assistance Equipment used: Rolling walker (2 wheeled) Transfers: Sit to/from Stand Sit to Stand: +2 physical assistance;Mod assist         General transfer comment: cues for UE/LE placement    Balance Overall balance assessment: Needs assistance Sitting-balance support: Feet supported;Single extremity supported Sitting balance-Leahy Scale: Fair     Standing balance support: Bilateral upper extremity supported Standing balance-Leahy Scale: poor                              ADL Overall ADL's : Needs assistance/impaired     Grooming: Supervision/safety;Sitting   Upper Body Bathing:  Supervision/ safety;Sitting   Lower Body Bathing: Moderate assistance;+2 for physical assistance;With adaptive equipment;Sit to/from stand   Upper Body Dressing : Minimal assistance;Sitting (iv)   Lower Body Dressing: Maximal assistance;+2 for physical assistance;With adaptive equipment;Adhering to hip precautions;Bed level   Toilet Transfer: Moderate assistance;+2 for physical assistance;Stand-pivot (to bed)   Toileting- Clothing Manipulation and Hygiene: Moderate assistance;+2 for physical assistance;Sit to/from stand         General ADL Comments: reviewed posterior THPs and educated on these and ADLs.  Introduced Architect, which she tried, with assistance.  Will need reinforcement for all.  Pt nauseas when she first sat on EOB--passed when cool cloth given:  RN aware     Vision                     Perception     Praxis      Pertinent Vitals/Pain Pain Assessment: Faces Faces Pain Scale: Hurts little more Pain Location: L hip Pain Descriptors / Indicators: Constant;Discomfort Pain Intervention(s): Limited activity within patient's tolerance;Monitored during session;Premedicated before session;Repositioned (ice removed:  no pain lying in bed)     Hand Dominance     Extremity/Trunk Assessment Upper Extremity Assessment Upper Extremity Assessment: Generalized weakness          Communication Communication Communication: No difficulties   Cognition Arousal/Alertness: Awake/alert Behavior During Therapy: WFL for tasks assessed/performed Overall Cognitive Status: Impaired/Different from baseline Area of Impairment: Memory  General Comments: needs reinforcement with THPs.  Decreased STM during session   General Comments       Exercises       Shoulder Instructions      Home Living   Living Arrangements: Alone                                      Prior Functioning/Environment Level of Independence:  Independent;Independent with assistive device(s)             OT Diagnosis: Generalized weakness   OT Problem List: Decreased strength;Decreased activity tolerance;Decreased knowledge of use of DME or AE;Decreased knowledge of precautions;Pain;Decreased cognition   OT Treatment/Interventions: Self-care/ADL training;DME and/or AE instruction;Patient/family education;Cognitive remediation/compensation    OT Goals(Current goals can be found in the care plan section) Acute Rehab OT Goals Patient Stated Goal: get better OT Goal Formulation: With patient Time For Goal Achievement: 11/24/14 Potential to Achieve Goals: Good ADL Goals Pt Will Perform Lower Body Bathing: with min assist;sit to/from stand;with adaptive equipment Pt Will Perform Lower Body Dressing: with mod assist;with adaptive equipment;sit to/from stand Pt Will Transfer to Toilet: with min assist;ambulating;bedside commode Pt Will Perform Toileting - Clothing Manipulation and hygiene: with min assist;sit to/from stand Additional ADL Goal #1: pt will recall 3/3 THPS  OT Frequency: Min 2X/week   Barriers to D/C:            Co-evaluation              End of Session Nurse Communication:  (nausea, which passed)  Activity Tolerance: Patient limited by fatigue Patient left: in bed;with call bell/phone within reach   Time: 1025-1052 OT Time Calculation (min): 27 min Charges:  OT General Charges $OT Visit: 1 Procedure OT Evaluation $Initial OT Evaluation Tier I: 1 Procedure OT Treatments $Self Care/Home Management : 8-22 mins G-Codes:    Orenthal Debski 2014/12/16, 11:15 AM  Lesle Chris, OTR/L [phone] 12-16-2014

## 2014-11-17 NOTE — Progress Notes (Signed)
ANTICOAGULATION CONSULT NOTE - Follow-up Consult  Pharmacy Consult for warfarin Indication: atrial fibrillation and VTE prophylaxis  Allergies  Allergen Reactions  . Morphine And Related Other (See Comments)    Hallucination    Patient Measurements: Height: 5\' 4"  (162.6 cm) Weight: 107 lb 8 oz (48.762 kg) IBW/kg (Calculated) : 54.7 Heparin Dosing Weight:   Vital Signs: Temp: 99.1 F (37.3 C) (01/05 1100) Temp Source: Oral (01/05 0523) BP: 118/45 mmHg (01/05 1420) Pulse Rate: 70 (01/05 1420)  Labs:  Recent Labs  11/14/14 2044 11/15/14 0540 11/15/14 1531 11/16/14 0500 11/17/14 0520  HGB 11.9* 11.3*  --  10.7* 10.2*  HCT 37.9 36.8  --  34.0* 32.8*  PLT 243 206  --  182 194  LABPROT 31.1* 32.2* 19.2* 15.5* 14.9  INR 2.97* 3.11* 1.60* 1.21 1.16  CREATININE 1.00 0.93  --  0.82 1.05  TROPONINI <0.03  --   --   --   --     Estimated Creatinine Clearance: 35.1 mL/min (by C-G formula based on Cr of 1.05).   Medical History: Past Medical History  Diagnosis Date  . Atrial flutter     successful radiofrequency ablation 2007  . Paroxysmal atrial fibrillation   . Tachycardia-bradycardia syndrome     permament pacermaker, dual chamber Medtronic on 06/2011  . Hypertension   . Hyperlipidemia   . Pacemaker   . Tubular adenoma   . Hiatal hernia   . Diverticula of colon   . Family history of colon cancer     brother    Assessment: 65 YOF admitted with femoral neck fracture s/p repair 1/4.  She takes warfarin for atrial fibrillation which was held for surgery.  Orders to resume post-op   Home warfarin dose: 2mg  daily except 1mg  on Friday (Med history states 4mg  daily per family)  Today, 11/17/2014   INR = 1.16 - subtherapeutic, no change after 1 dose coumadin last night (received Vit 5mg  IV 1/3)  CBC: Hgb = 10.2, stable overnight with pltc WNL  Drug interactions = amiodarone (chronic medication)  Also on lovenox 40mg  q24h  Goal of Therapy:  INR 2-3 Monitor  platelets by anticoagulation protocol: Yes   Plan:   Repeat Coumadin 2mg  PO x 1 tonight  May have reduced requirements if not eating well  D/C enoxaparin when INR >= 1.8  Daily INR  Monitoring for bleeding  Ralene Bathe, PharmD, BCPS 11/17/2014, 2:25 PM  Phone: [phone]

## 2014-11-17 NOTE — Evaluation (Signed)
Physical Therapy Evaluation Patient Details Name: Anne Anthony MRN: [id number] DOB: [birth year] Today's Date: 11/17/2014   History of Present Illness  79 yo female adm 11/14/14 with hip fx and underwent L hip hemiarthroplasty 11/16/14 per Dr. Alvan Dame; PMHx: HTN, pacemaker  Clinical Impression  Pt admitted with above diagnosis. Pt currently with functional limitations due to the deficits listed below (see PT Problem List).  Pt will benefit from skilled PT to increase their independence and safety with mobility to allow discharge to the venue listed below.  Recommend SNF level therapies post acute, will follow     Follow Up Recommendations SNF    Equipment Recommendations  None recommended by PT    Recommendations for Other Services       Precautions / Restrictions Precautions Precautions: Fall Restrictions Other Position/Activity Restrictions: WBAT LLE      Mobility  Bed Mobility Overal bed mobility: Needs Assistance Bed Mobility: Supine to Sit     Supine to sit: Mod assist;Max assist     General bed mobility comments: cues for technique, assist for trunk and LLE  Transfers Overall transfer level: Needs assistance Equipment used: Rolling walker (2 wheeled) Transfers: Sit to/from Stand Sit to Stand: +2 physical assistance;Mod assist         General transfer comment: cues for hand and LE position  Ambulation/Gait Ambulation/Gait assistance: +2 physical assistance;Min assist Ambulation Distance (Feet): 4 Feet Assistive device: Rolling walker (2 wheeled) Gait Pattern/deviations: Step-to pattern;Antalgic     General Gait Details: cues for sequence, and use of UEs for pain control  Stairs            Wheelchair Mobility    Modified Rankin (Stroke Patients Only)       Balance Overall balance assessment: Needs assistance Sitting-balance support: Feet supported;Single extremity supported Sitting balance-Leahy Scale: Fair     Standing balance support:  Bilateral upper extremity supported Standing balance-Leahy Scale: Zero                               Pertinent Vitals/Pain Pain Assessment: 0-10 Pain Location: L hip Pain Descriptors / Indicators: Constant;Discomfort Pain Intervention(s): Limited activity within patient's tolerance;Repositioned;Ice applied    Home Living   Living Arrangements: Alone                    Prior Function Level of Independence: Independent;Independent with assistive device(s)               Hand Dominance        Extremity/Trunk Assessment   Upper Extremity Assessment: Generalized weakness           Lower Extremity Assessment: LLE deficits/detail;Generalized weakness   LLE Deficits / Details: grossly 2+/5     Communication   Communication: No difficulties  Cognition Arousal/Alertness: Awake/alert Behavior During Therapy: WFL for tasks assessed/performed Overall Cognitive Status: Within Functional Limits for tasks assessed                      General Comments      Exercises General Exercises - Lower Extremity Ankle Circles/Pumps: AROM;Both;10 reps      Assessment/Plan    PT Assessment Patient needs continued PT services  PT Diagnosis Difficulty walking   PT Problem List Decreased strength;Decreased activity tolerance;Decreased balance;Decreased mobility;Decreased knowledge of use of DME;Decreased knowledge of precautions  PT Treatment Interventions DME instruction;Gait training;Functional mobility training;Therapeutic activities;Therapeutic exercise;Patient/family education   PT Goals (Current  goals can be found in the Care Plan section) Acute Rehab PT Goals Patient Stated Goal: get better PT Goal Formulation: With patient Time For Goal Achievement: 12/01/14 Potential to Achieve Goals: Good    Frequency Min 3X/week   Barriers to discharge        Co-evaluation               End of Session Equipment Utilized During Treatment: Gait  belt Activity Tolerance: Patient limited by fatigue;Patient limited by pain Patient left: in chair;with call bell/phone within reach           Time: 0903-0936 PT Time Calculation (min) (ACUTE ONLY): 33 min   Charges:   PT Evaluation $Initial PT Evaluation Tier I: 1 Procedure PT Treatments $Gait Training: 8-22 mins $Therapeutic Activity: 8-22 mins   PT G Codes:        Elisabet Gutzmer 2014/11/29, 10:16 AM

## 2014-11-17 NOTE — Progress Notes (Signed)
CSW assisting with d/c planning. Met with pt this afternoon. Pt reports daughters will be visiting shortly. CSW will return to review PT recommendations and SNF bed offers.  Jamie Haidinger LCSW [phone] 

## 2014-11-17 NOTE — Progress Notes (Signed)
Patient ID: Anne Anthony, female   DOB: October 11, 1936, 79 y.o.   MRN: [id number] Subjective: 1 Day Post-Op Procedure(s) (LRB): ARTHROPLASTY BIPOLAR HIP (Left)    Patient reports pain as mild.  Objective:   VITALS:   Filed Vitals:   11/17/14 1100  BP: 118/48  Pulse: 70  Temp: 99.1 F (37.3 C)  Resp: 16    Neurovascular intact Incision: dressing C/D/I  LABS  Recent Labs  11/15/14 0540 11/16/14 0500 11/17/14 0520  HGB 11.3* 10.7* 10.2*  HCT 36.8 34.0* 32.8*  WBC 9.4 9.1 10.5  PLT 206 182 194     Recent Labs  11/15/14 0540 11/16/14 0500 11/17/14 0520  NA 134* 133* 133*  K 4.2 4.2 4.3  BUN 21 17 17   CREATININE 0.93 0.82 1.05  GLUCOSE 111* 94 105*     Recent Labs  11/16/14 0500 11/17/14 0520  INR 1.21 1.16     Assessment/Plan: 1 Day Post-Op Procedure(s) (LRB): ARTHROPLASTY BIPOLAR HIP (Left)   Advance diet Up with therapy Discharge home with home health versus SNF depending on therapy progress over next couple days

## 2014-11-17 NOTE — Progress Notes (Signed)
[facility] PROGRESS NOTE  Anne Anthony EHO:[id number] DOB: 1936-01-20 DOA: 11/14/2014 PCP: [facility] Kilts, MD  Assessment/Plan: #1 left femur fracture Secondary to mechanical fall. Patient has been seen by orthopedics. Patient's INR is reversed. Patient status post left hip hemiarthroplasty 11/16/2014. PT/OT. Would likely need skilled nursing facility. Per orthopedics.   #2 atrial fibrillation Currently rate controlled on IV metoprolol and oral amiodarone. Coumadin was held in preparation for surgery. Orthopedics to advice when Coumadin may be resumed.   #3 history of tachybradycardia syndrome status post PPM  #4 microcytic anemia Follow H&H. Outpatient follow-up.  # 5 hyperlipidemia Continue statin.  #6 gastroesophageal reflux disease PPI.  #7 severe protein calorie malnutrition Will place on nutritional supplements.  #8 prophylaxis PPI for GI prophylaxis. SCDs for DVT prophylaxis.  Code Status: Full Family Communication: Updated patient and daughter at bedside. Disposition Plan: Likely SNF pending surgery.   Consultants:  Orthopedics: Merla Riches, PA 11/15/2014  Procedures:  Chest x-ray 11/14/2014  X-ray of the left hip 11/14/2014  Left hip hemiarthroplasty 11/16/2014 per Dr. Alvan Dame  Antibiotics:  None  HPI/Subjective: Patient states left hip pain controlled. Patient denies any chest pain. No shortness of breath.  Objective: Filed Vitals:   11/17/14 1200  BP:   Pulse:   Temp:   Resp: 18    Intake/Output Summary (Last 24 hours) at 11/17/14 1310 Last data filed at 11/17/14 1100  Gross per 24 hour  Intake 2734.17 ml  Output    815 ml  Net 1919.17 ml   Filed Weights   11/14/14 2347  Weight: 48.762 kg (107 lb 8 oz)    Exam:   General:  NAD  Cardiovascular: RRR  Respiratory: CTAB anterior lung fields  Abdomen: Soft, nontender, nondistended, positive bowel sounds.  Musculoskeletal: No clubbing cyanosis or edema.  Data  Reviewed: Basic Metabolic Panel:  Recent Labs Lab 11/14/14 2044 11/15/14 0540 11/16/14 0500 11/17/14 0520  NA 138 134* 133* 133*  K 4.2 4.2 4.2 4.3  CL 104 104 101 101  CO2 20 27 25 25   GLUCOSE 141* 111* 94 105*  BUN 19 21 17 17   CREATININE 1.00 0.93 0.82 1.05  CALCIUM 8.3* 8.3* 8.5 8.7   Liver Function Tests:  Recent Labs Lab 11/14/14 2044 11/15/14 0540  AST 22 18  ALT 16 15  ALKPHOS 47 45  BILITOT 0.8 0.6  PROT 6.7 6.3  ALBUMIN 3.7 3.4*   No results for input(s): LIPASE, AMYLASE in the last 168 hours. No results for input(s): AMMONIA in the last 168 hours. CBC:  Recent Labs Lab 11/14/14 2044 11/15/14 0540 11/16/14 0500 11/17/14 0520  WBC 11.3* 9.4 9.1 10.5  NEUTROABS 8.9* 7.0  --   --   HGB 11.9* 11.3* 10.7* 10.2*  HCT 37.9 36.8 34.0* 32.8*  MCV 105.9* 107.0* 106.6* 106.8*  PLT 243 206 182 194   Cardiac Enzymes:  Recent Labs Lab 11/14/14 2044  TROPONINI <0.03   BNP (last 3 results)  Recent Labs  06/12/14 1918  PROBNP 857.5*   CBG: No results for input(s): GLUCAP in the last 168 hours.  Recent Results (from the past 240 hour(s))  Urine culture     Status: None   Collection Time: 11/15/14  1:10 AM  Result Value Ref Range Status   Specimen Description URINE, CATHETERIZED  Final   Special Requests NONE  Final   Colony Count NO GROWTH Performed at [facility]   Final   Culture NO GROWTH Performed at [facility]   Final   Report Status 11/16/2014 FINAL  Final  Surgical pcr screen     Status: None   Collection Time: 11/16/14 12:49 PM  Result Value Ref Range Status   MRSA, PCR NEGATIVE NEGATIVE Final   Staphylococcus aureus NEGATIVE NEGATIVE Final    Comment:        The Xpert SA Assay (FDA approved for NASAL specimens in patients over 26 years of age), is one component of a comprehensive surveillance program.  Test performance has been validated by [employer] for patients greater than or equal to 40 year old. It  is not intended to diagnose infection nor to guide or monitor treatment.      Studies: Pelvis Portable  11/16/2014   CLINICAL DATA:  Postoperative evaluation after left hip replacement  EXAM: PORTABLE PELVIS 1-2 VIEWS  COMPARISON:  None.  FINDINGS: Unipolar left hip replacement in anticipated position. There is anticipated postoperative soft tissue change surrounding the joint. Mild right hip arthritis and pubic symphysitis.  IMPRESSION: Anticipated postoperative appearance   Electronically Signed   By: Skipper Cliche M.D.   On: 11/16/2014 18:44    Scheduled Meds: . amiodarone  200 mg Oral Daily  . docusate sodium  100 mg Oral BID  . enoxaparin (LOVENOX) injection  40 mg Subcutaneous Q24H  . escitalopram  5 mg Oral Daily  . ferrous sulfate  325 mg Oral TID PC  . gabapentin  300 mg Oral QHS  . metoprolol  5 mg Intravenous 3 times per day  . pantoprazole  40 mg Oral Daily  . simvastatin  20 mg Oral QPM  . Warfarin - Pharmacist Dosing Inpatient   Does not apply q1800   Continuous Infusions: . sodium chloride    . lactated ringers 1,000 mL (11/16/14 1616)    Principal Problem:   Closed left hip fracture Active Problems:   Tachycardia-bradycardia syndrome   Hyperlipidemia   Chronic atrial fibrillation   Left displaced femoral neck fracture   Anticoagulated on Coumadin    Time spent: 8 minutes    THOMPSON,DANIEL M.D. [facility] Pager [phone]. If 7PM-7AM, please contact night-coverage at [email or web address], password [facility] 11/17/2014, 1:10 PM  LOS: 3 days

## 2014-11-18 ENCOUNTER — Inpatient Hospital Stay
Admission: RE | Admit: 2014-11-18 | Discharge: 2014-12-07 | Disposition: A | Discharge status: Home / Self Care | Payer: Medicare Other | Source: Ambulatory Visit | Attending: Internal Medicine | Admitting: Internal Medicine

## 2014-11-18 DIAGNOSIS — E43 Unspecified severe protein-calorie malnutrition: Secondary | ICD-10-CM

## 2014-11-18 DIAGNOSIS — R0689 Other abnormalities of breathing: Principal | ICD-10-CM

## 2014-11-18 LAB — CBC
HEMATOCRIT: 26.6 % — AB (ref 36.0–46.0)
Hemoglobin: 8.5 g/dL — ABNORMAL LOW (ref 12.0–15.0)
MCH: 33.9 pg (ref 26.0–34.0)
MCHC: 32 g/dL (ref 30.0–36.0)
MCV: 106 fL — AB (ref 78.0–100.0)
Platelets: 170 10*3/uL (ref 150–400)
RBC: 2.51 MIL/uL — ABNORMAL LOW (ref 3.87–5.11)
RDW: 14.7 % (ref 11.5–15.5)
WBC: 9.7 10*3/uL (ref 4.0–10.5)

## 2014-11-18 LAB — BASIC METABOLIC PANEL
Anion gap: 8 (ref 5–15)
BUN: 15 mg/dL (ref 6–23)
CO2: 24 mmol/L (ref 19–32)
CREATININE: 0.9 mg/dL (ref 0.50–1.10)
Calcium: 8 mg/dL — ABNORMAL LOW (ref 8.4–10.5)
Chloride: 102 mEq/L (ref 96–112)
GFR, EST AFRICAN AMERICAN: 70 mL/min — AB (ref >90)
GFR, EST NON AFRICAN AMERICAN: 61 mL/min — AB (ref >90)
Glucose, Bld: 119 mg/dL — ABNORMAL HIGH (ref 70–99)
POTASSIUM: 4 mmol/L (ref 3.5–5.1)
SODIUM: 134 mmol/L — AB (ref 135–145)

## 2014-11-18 LAB — PROTIME-INR
INR: 1.32 (ref 0.00–1.49)
Prothrombin Time: 16.6 seconds — ABNORMAL HIGH (ref 11.6–15.2)

## 2014-11-18 MED ORDER — POLYETHYLENE GLYCOL 3350 17 G PO PACK: 17.0000 g | PACK | Freq: Every day | ORAL | Status: DC | PRN

## 2014-11-18 MED ORDER — ACETAMINOPHEN 325 MG PO TABS: 325.0000 mg | ORAL_TABLET | ORAL | Status: DC | PRN

## 2014-11-18 MED ORDER — ENOXAPARIN SODIUM 40 MG/0.4ML ~~LOC~~ SOLN: 40.0000 mg | SUBCUTANEOUS | Status: DC

## 2014-11-18 MED ORDER — FERROUS SULFATE 325 (65 FE) MG PO TABS: 325.0000 mg | ORAL_TABLET | Freq: Three times a day (TID) | ORAL | Status: DC

## 2014-11-18 MED ORDER — TRAMADOL HCL 50 MG PO TABS: 50.0000 mg | ORAL_TABLET | Freq: Four times a day (QID) | ORAL | Status: DC | PRN

## 2014-11-18 MED ORDER — HYDROCODONE-ACETAMINOPHEN 5-325 MG PO TABS: 1.0000 | ORAL_TABLET | Freq: Four times a day (QID) | ORAL | Status: DC | PRN

## 2014-11-18 MED ORDER — DSS 100 MG PO CAPS: 100.0000 mg | ORAL_CAPSULE | Freq: Two times a day (BID) | ORAL | Status: DC

## 2014-11-18 NOTE — Progress Notes (Signed)
Clinical Social Work Department CLINICAL SOCIAL WORK PLACEMENT NOTE 11/18/2014  Patient:  Anne Anthony, Anne Anthony  Account Number:  [id number] Admit date:  11/14/2014  Clinical Social Worker:  Werner Lean, LCSW  Date/time:  11/16/2014 03:50 PM  Clinical Social Work is seeking post-discharge placement for this patient at the following level of care:   SKILLED NURSING   (*CSW will update this form in Epic as items are completed)   11/16/2014  Patient/family provided with [place] Department of Clinical Social Work's list of facilities offering this level of care within the geographic area requested by the patient (or if unable, by the patient's family).  11/16/2014  Patient/family informed of their freedom to choose among providers that offer the needed level of care, that participate in Medicare, Medicaid or managed care program needed by the patient, have an available bed and are willing to accept the patient.    Patient/family informed of [facility]' ownership interest in [facility], as well as of the fact that they are under no obligation to receive care at this facility.  PASARR submitted to EDS on 11/16/2014 PASARR number received on 11/16/2014  FL2 transmitted to all facilities in geographic area requested by pt/family on  11/16/2014 FL2 transmitted to all facilities within larger geographic area on   Patient informed that his/her managed care company has contracts with or will negotiate with  certain facilities, including the following:     Patient/family informed of bed offers received:  11/16/2014 Patient chooses bed at [facility] Physician recommends and patient chooses bed at    Patient to be transferred to [facility] on  11/18/2014 Patient to be transferred to facility by [facility] Patient and family notified of transfer on 11/18/2014 Name of family member notified:  Daughters  The following physician request were entered in  Epic:   Additional Comments: 51: 64 [name] contacted, as requested, and informed of arrival of [facility] to transport pt to [facility]. She will let her sister, [name], know pt is ready to leave hospital. Daughters plan to meet pt at [facility]. Pt aware of this plan.  Werner Lean LCSW [phone]

## 2014-11-18 NOTE — Discharge Summary (Signed)
Physician Discharge Summary  Anne Anthony IFO:[id number] DOB: 19-Jan-1936 DOA: 11/14/2014  PCP: Purvis Kilts, MD  Admit date: 11/14/2014 Discharge date: 11/18/2014  Time spent: 35 minutes  Recommendations for Outpatient Follow-up:  1. Follow up with PCP in 2 week 2. Follow up with ortho in 4 weeks. 3. SNF for Rehab  Discharge Diagnoses:  Principal Problem:   Closed left hip fracture Active Problems:   Tachycardia-bradycardia syndrome   Hyperlipidemia   Chronic atrial fibrillation   Left displaced femoral neck fracture   Anticoagulated on Coumadin   Protein-calorie malnutrition, severe   Severe protein-calorie malnutrition   Discharge Condition: stable  Diet recommendation: heart healthy  Filed Weights   11/14/14 2347  Weight: 48.762 kg (107 lb 8 oz)    History of present illness:   HPI: Anne Anthony is a 79 y.o. female with history of atrial fibrillation, history of tachybradycardia syndrome status post pacemaker placement, hyperlipidemia who has had a recent surgery for hiatal hernia at [facility] 2 months ago was brought to the ER after patient had a fall yesterday. Patient also had a fall today following which patient started noticing increasing pain in the left hip. X-rays reveal left hip fracture and orthopedic surgeon Dr. Alvan Dame was consulted and patient will be admitted for further management of the left hip fracture. Patient states she slipped and fell denies any loss of consciousness chest pain palpitations nausea vomiting abdominal pain or diarrhea. Denies any shortness of breath fever chills  Hospital Course:  Left femur fracture - Secondary to mechanical fall. - Ortho consulted rec surgery. INR reversed. - Patient status post left hip hemiarthroplasty 11/16/2014.  - PT recommended skilled nursing facility.  - ortho rec lovenox and coumadin until INR therapeutic.  Atrial fibrillation - ate controlled on IV metoprolol and oral amiodarone.  -  Coumadin was held in preparation for surgery.  - resume as an outpatient.  History of tachybradycardia syndrome status post PPM  Microcytic anemia Follow H&H. Outpatient follow-up.  Hyperlipidemia Continue statin.  Gastroesophageal reflux disease PPI.  Severe protein calorie malnutrition Will place on nutritional supplements  onsultants:  Orthopedics: Merla Riches, PA 11/15/2014  Procedures:  Chest x-ray 11/14/2014  X-ray of the left hip 11/14/2014  Left hip hemiarthroplasty 11/16/2014 per Dr. Alvan Dame  Antibiotics:  None  Discharge Exam: Filed Vitals:   11/18/14 0506  BP: 110/45  Pulse: 70  Temp: 99.9 F (37.7 C)  Resp: 16    General: A&O x3 Cardiovascular: RRR Respiratory: good air movement CTA B/L  Discharge Instructions   Discharge Instructions    Call MD / Call 911    Complete by:  As directed   If you experience chest pain or shortness of breath, CALL 911 and be transported to the hospital emergency room.  If you develope a fever above 101 F, pus (white drainage) or increased drainage or redness at the wound, or calf pain, call your surgeon's office.     Change dressing    Complete by:  As directed   Maintain surgical dressing until follow up in the clinic. If the edges start to pull up, may reinforce with tape. If the dressing is no longer working, may remove and cover with gauze and tape, but must keep the area dry and clean.  Call with any questions or concerns.     Constipation Prevention    Complete by:  As directed   Drink plenty of fluids.  Prune juice may be helpful.  You may use  a stool softener, such as Colace (over the counter) 100 mg twice a day.  Use MiraLax (over the counter) for constipation as needed.     Diet - low sodium heart healthy    Complete by:  As directed      Diet - low sodium heart healthy    Complete by:  As directed      Discharge instructions    Complete by:  As directed   Maintain surgical dressing until follow up in the  clinic. If the edges start to pull up, may reinforce with tape. If the dressing is no longer working, may remove and cover with gauze and tape, but must keep the area dry and clean.  Follow up in 2 weeks at [facility]. Call with any questions or concerns.     Increase activity slowly as tolerated    Complete by:  As directed      Increase activity slowly    Complete by:  As directed      TED hose    Complete by:  As directed   Use stockings (TED hose) for 2 weeks on both leg(s).  You may remove them at night for sleeping.     Weight bearing as tolerated    Complete by:  As directed   Laterality:  left  Extremity:  Lower          Current Discharge Medication List    START taking these medications   Details  acetaminophen (TYLENOL) 325 MG tablet Take 1-2 tablets (325-650 mg total) by mouth every 4 (four) hours as needed for fever, headache or mild pain.    docusate sodium 100 MG CAPS Take 100 mg by mouth 2 (two) times daily. Qty: 10 capsule, Refills: 0    enoxaparin (LOVENOX) 40 MG/0.4ML injection Inject 0.4 mLs (40 mg total) into the skin daily. Qty: 10 Syringe, Refills: 0    ferrous sulfate 325 (65 FE) MG tablet Take 1 tablet (325 mg total) by mouth 3 (three) times daily after meals. Refills: 3    polyethylene glycol (MIRALAX / GLYCOLAX) packet Take 17 g by mouth daily as needed for mild constipation. Qty: 14 each, Refills: 0    traMADol (ULTRAM) 50 MG tablet Take 1 tablet (50 mg total) by mouth every 6 (six) hours as needed for moderate pain. Qty: 80 tablet, Refills: 0      CONTINUE these medications which have CHANGED   Details  HYDROcodone-acetaminophen (NORCO/VICODIN) 5-325 MG per tablet Take 1 tablet by mouth every 6 (six) hours as needed for moderate pain or severe pain. Qty: 15 tablet, Refills: 0      CONTINUE these medications which have NOT CHANGED   Details  amiodarone (PACERONE) 200 MG tablet Take 200 mg by mouth daily.    dexlansoprazole  (DEXILANT) 60 MG capsule Take 1 capsule (60 mg total) by mouth daily. Qty: 30 capsule, Refills: 11    escitalopram (LEXAPRO) 10 MG tablet Take 5 mg by mouth daily.    metoprolol tartrate (LOPRESSOR) 25 MG tablet Take 25 mg by mouth 2 (two) times daily.    simvastatin (ZOCOR) 20 MG tablet Take 20 mg by mouth every evening.    warfarin (COUMADIN) 2 MG tablet Take 2 mg by mouth daily. Takes 2mg  daily except 1mg  on Fridays    zolpidem (AMBIEN) 5 MG tablet Take 5 mg by mouth at bedtime as needed for sleep.    furosemide (LASIX) 20 MG tablet Take 1 tablet (20 mg total)  by mouth daily. Qty: 2 tablet, Refills: 0    gabapentin (NEURONTIN) 300 MG capsule 1 cap at bedtime Qty: 30 capsule, Refills: 6   Associated Diagnoses: Left-sided low back pain with left-sided sciatica      STOP taking these medications     oxyCODONE (OXY IR/ROXICODONE) 5 MG immediate release tablet        Allergies  Allergen Reactions  . Morphine And Related Other (See Comments)    Hallucination   Follow-up Information    Follow up with Mauri Pole, MD. Schedule an appointment as soon as possible for a visit in 2 weeks.   Specialty:  Orthopedic Surgery   Contact information:   [address] [place] [address] [phone]        The results of significant diagnostics from this hospitalization (including imaging, microbiology, ancillary and laboratory) are listed below for reference.    Significant Diagnostic Studies: Dg Chest 1 View  11/14/2014   CLINICAL DATA:  Status post two falls, yesterday and today. Concern for chest injury. Initial encounter.  EXAM: CHEST - 1 VIEW  COMPARISON:  Chest radiograph and CTA of the chest performed 06/12/2014  FINDINGS: The lungs are well-aerated. Mild vascular congestion is noted. No pleural effusion or pneumothorax is seen. Mild scarring is noted at the lung apices.  The cardiomediastinal silhouette is borderline normal in size. A pacemaker is noted  overlying the left chest wall, with leads ending overlying the right atrium and right ventricle. The patient's very large hiatal hernia is partially filled with solid material, with surrounding atelectasis. No acute osseous abnormalities are seen.  IMPRESSION: 1. No displaced rib fracture seen. 2. Mild vascular congestion noted. Mild scarring at the lung apices. 3. Very large hiatal hernia is partially filled with solid material, with surrounding atelectasis.   Electronically Signed   By: Garald Balding M.D.   On: 11/14/2014 20:28   Dg Hip Complete Left  11/14/2014   CLINICAL DATA:  Initial encounter for falls yesterday and today in a pop in her hip while walking in her house today.  EXAM: LEFT HIP - COMPLETE 2+ VIEW  COMPARISON:  02/28/2014  FINDINGS: Bones are diffusely demineralized. No evidence for sacral fracture. Symphysis pubis is unremarkable.  AP and cross-table lateral views of the left hip show a subcapital femoral neck fracture with bony over riding and varus angulation.  IMPRESSION: Left femoral neck fracture with varus angulation.   Electronically Signed   By: Misty Stanley M.D.   On: 11/14/2014 20:29   Pelvis Portable  11/16/2014   CLINICAL DATA:  Postoperative evaluation after left hip replacement  EXAM: PORTABLE PELVIS 1-2 VIEWS  COMPARISON:  None.  FINDINGS: Unipolar left hip replacement in anticipated position. There is anticipated postoperative soft tissue change surrounding the joint. Mild right hip arthritis and pubic symphysitis.  IMPRESSION: Anticipated postoperative appearance   Electronically Signed   By: Skipper Cliche M.D.   On: 11/16/2014 18:44    Microbiology: Recent Results (from the past 240 hour(s))  Urine culture     Status: None   Collection Time: 11/15/14  1:10 AM  Result Value Ref Range Status   Specimen Description URINE, CATHETERIZED  Final   Special Requests NONE  Final   Colony Count NO GROWTH Performed at [facility]   Final   Culture NO  GROWTH Performed at [facility]   Final   Report Status 11/16/2014 FINAL  Final  Surgical pcr screen  Status: None   Collection Time: 11/16/14 12:49 PM  Result Value Ref Range Status   MRSA, PCR NEGATIVE NEGATIVE Final   Staphylococcus aureus NEGATIVE NEGATIVE Final    Comment:        The Xpert SA Assay (FDA approved for NASAL specimens in patients over 34 years of age), is one component of a comprehensive surveillance program.  Test performance has been validated by [employer] for patients greater than or equal to 7 year old. It is not intended to diagnose infection nor to guide or monitor treatment.      Labs: Basic Metabolic Panel:  Recent Labs Lab 11/14/14 2044 11/15/14 0540 11/16/14 0500 11/17/14 0520 11/18/14 0350  NA 138 134* 133* 133* 134*  K 4.2 4.2 4.2 4.3 4.0  CL 104 104 101 101 102  CO2 20 27 25 25 24   GLUCOSE 141* 111* 94 105* 119*  BUN 19 21 17 17 15   CREATININE 1.00 0.93 0.82 1.05 0.90  CALCIUM 8.3* 8.3* 8.5 8.7 8.0*   Liver Function Tests:  Recent Labs Lab 11/14/14 2044 11/15/14 0540  AST 22 18  ALT 16 15  ALKPHOS 47 45  BILITOT 0.8 0.6  PROT 6.7 6.3  ALBUMIN 3.7 3.4*   No results for input(s): LIPASE, AMYLASE in the last 168 hours. No results for input(s): AMMONIA in the last 168 hours. CBC:  Recent Labs Lab 11/14/14 2044 11/15/14 0540 11/16/14 0500 11/17/14 0520 11/18/14 0350  WBC 11.3* 9.4 9.1 10.5 9.7  NEUTROABS 8.9* 7.0  --   --   --   HGB 11.9* 11.3* 10.7* 10.2* 8.5*  HCT 37.9 36.8 34.0* 32.8* 26.6*  MCV 105.9* 107.0* 106.6* 106.8* 106.0*  PLT 243 206 182 194 170   Cardiac Enzymes:  Recent Labs Lab 11/14/14 2044  TROPONINI <0.03   BNP: BNP (last 3 results)  Recent Labs  06/12/14 1918  PROBNP 857.5*   CBG: No results for input(s): GLUCAP in the last 168 hours.     Signed:  Charlynne Cousins  [facility] 11/18/2014, 10:50 AM

## 2014-11-18 NOTE — Progress Notes (Signed)
Physical Therapy Treatment Patient Details Name: Anne Anthony MRN: [id number] DOB: 1936-01-08 Today's Date: 11/18/2014    History of Present Illness 79 yo female adm 11/14/14 with hip fx and underwent L hip hemiarthroplasty 11/16/14 per Dr. Alvan Dame; PMHx: HTN, pacemaker    PT Comments    Pt  Progressing well with PT today; Hgb down from 10.2 to 8.5 but pt asymptomatic during therapy;   Follow Up Recommendations  SNF     Equipment Recommendations  None recommended by PT    Recommendations for Other Services       Precautions / Restrictions Precautions Precautions: Fall Precaution Comments: reviewed posterior THPs and sign hung in room Restrictions Weight Bearing Restrictions: No Other Position/Activity Restrictions: WBAT LLE    Mobility  Bed Mobility Overal bed mobility: Needs Assistance Bed Mobility: Supine to Sit     Supine to sit: +2 for physical assistance;Mod assist;Min assist;HOB elevated     General bed mobility comments: cues for technique.  Assist with LEs and guided trunk  Transfers Overall transfer level: Needs assistance Equipment used: Rolling walker (2 wheeled) Transfers: Sit to/from Stand Sit to Stand: Mod assist         General transfer comment: cues for UE/LE placement  Ambulation/Gait Ambulation/Gait assistance: Min assist;Mod assist;+2 safety/equipment Ambulation Distance (Feet): 11 Feet Assistive device: Rolling walker (2 wheeled) Gait Pattern/deviations: Step-to pattern;Antalgic     General Gait Details: cues for sequence, and use of UEs for pain control   Stairs            Wheelchair Mobility    Modified Rankin (Stroke Patients Only)       Balance Overall balance assessment: History of Falls;Needs assistance         Standing balance support: Bilateral upper extremity supported Standing balance-Leahy Scale: Fair                      Cognition Arousal/Alertness: Awake/alert Behavior During Therapy: WFL  for tasks assessed/performed Overall Cognitive Status: Impaired/Different from baseline       Memory: Decreased recall of precautions         General Comments: needs reinforcement of THP    Exercises General Exercises - Lower Extremity Ankle Circles/Pumps: AROM;Both;10 reps Quad Sets: AROM;Strengthening;Left;10 reps Heel Slides: AAROM;Strengthening;Left;10 reps    General Comments        Pertinent Vitals/Pain Pain Assessment: 0-10 Pain Score: 7  Pain Location: L hip Pain Descriptors / Indicators: Sore Pain Intervention(s): Limited activity within patient's tolerance;Monitored during session;Patient requesting pain meds-RN notified;RN gave pain meds during session    Home Living                      Prior Function            PT Goals (current goals can now be found in the care plan section) Acute Rehab PT Goals Patient Stated Goal: get better PT Goal Formulation: With patient Time For Goal Achievement: 12/01/14 Potential to Achieve Goals: Good Progress towards PT goals: Progressing toward goals    Frequency  Min 3X/week    PT Plan Current plan remains appropriate    Co-evaluation             End of Session Equipment Utilized During Treatment: Gait belt Activity Tolerance: Patient tolerated treatment well Patient left: in chair;with call bell/phone within reach     Time: 0937-0956 PT Time Calculation (min) (ACUTE ONLY): 19 min  Charges:  $Gait Training: 8-22 mins  G CodesKenyon Ana 11/18/2014, 10:17 AM

## 2014-11-18 NOTE — Progress Notes (Signed)
Pt stable for d/c to [facility] today. P-[facility] transport needed. Pt / family aware and in agreement with d/c plan. NSG has reviewed d/c summary, scripts, avs. Scripts included in d/c packet.   Werner Lean LCSW [phone]

## 2014-11-18 NOTE — Progress Notes (Signed)
     Subjective: 2 Days Post-Op Procedure(s) (LRB): ARTHROPLASTY BIPOLAR HIP (Left)   Patient reports pain as mild, pain controlled. No events throughout the night.   Objective:   VITALS:   Filed Vitals:   11/18/14 0506  BP: 110/45  Pulse: 70  Temp: 99.9 F (37.7 C)  Resp: 16    Dorsiflexion/Plantar flexion intact Incision: dressing C/D/I No cellulitis present Compartment soft  LABS  Recent Labs  11/16/14 0500 11/17/14 0520 11/18/14 0350  HGB 10.7* 10.2* 8.5*  HCT 34.0* 32.8* 26.6*  WBC 9.1 10.5 9.7  PLT 182 194 170     Recent Labs  11/16/14 0500 11/17/14 0520 11/18/14 0350  NA 133* 133* 134*  K 4.2 4.3 4.0  BUN 17 17 15   CREATININE 0.82 1.05 0.90  GLUCOSE 94 105* 119*     Assessment/Plan: 2 Days Post-Op Procedure(s) (LRB): ARTHROPLASTY BIPOLAR HIP (Left)  Orthopedically stable Up with therapy Discharge to SNF eventually, when ready medically  Ortho recommendations:  Coumadin with branching Lovenox (Rx written)   Tramadol and APAP for pain management (Rx written).  MiraLax and Colace for constipation  Iron 325 mg tid for 2-3 weeks   WBAT on the left leg.  Dressing to remain in place until follow in clinic in 2 weeks.  Dressing is waterproof and may shower with it in place.  Follow up in 2 weeks at [facility]. Follow up with OLIN,Zaiden Ludlum D in 2 weeks.  Contact information:  [facility] [address], [address] [place] Giavonni Cizek   PAC  11/18/2014, 9:19 AM

## 2014-11-19 ENCOUNTER — Other Ambulatory Visit: Payer: Self-pay | Admitting: *Deleted

## 2014-11-19 ENCOUNTER — Ambulatory Visit (HOSPITAL_COMMUNITY): Payer: Medicare Other | Attending: Internal Medicine

## 2014-11-19 DIAGNOSIS — J9811 Atelectasis: Secondary | ICD-10-CM | POA: Diagnosis not present

## 2014-11-19 DIAGNOSIS — I700 Atherosclerosis of aorta: Secondary | ICD-10-CM | POA: Insufficient documentation

## 2014-11-19 DIAGNOSIS — Z950 Presence of cardiac pacemaker: Secondary | ICD-10-CM | POA: Insufficient documentation

## 2014-11-19 DIAGNOSIS — R0989 Other specified symptoms and signs involving the circulatory and respiratory systems: Secondary | ICD-10-CM | POA: Diagnosis present

## 2014-11-19 DIAGNOSIS — J90 Pleural effusion, not elsewhere classified: Secondary | ICD-10-CM | POA: Insufficient documentation

## 2014-11-19 DIAGNOSIS — K449 Diaphragmatic hernia without obstruction or gangrene: Secondary | ICD-10-CM | POA: Diagnosis not present

## 2014-11-19 MED ORDER — ZOLPIDEM TARTRATE 5 MG PO TABS: ORAL_TABLET | ORAL | Status: DC

## 2014-11-19 MED ORDER — HYDROCODONE-ACETAMINOPHEN 5-325 MG PO TABS
1.0000 | ORAL_TABLET | Freq: Four times a day (QID) | ORAL | Status: DC | PRN
Start: 2014-11-19 — End: 2014-11-25

## 2014-11-19 MED ORDER — TRAMADOL HCL 50 MG PO TABS: 50.0000 mg | ORAL_TABLET | Freq: Four times a day (QID) | ORAL | Status: DC | PRN

## 2014-11-19 NOTE — Telephone Encounter (Signed)
[facility] 

## 2014-11-20 ENCOUNTER — Non-Acute Institutional Stay (SKILLED_NURSING_FACILITY): Payer: Medicare Other | Admitting: Internal Medicine

## 2014-11-20 DIAGNOSIS — Z7901 Long term (current) use of anticoagulants: Secondary | ICD-10-CM

## 2014-11-20 DIAGNOSIS — I482 Chronic atrial fibrillation, unspecified: Secondary | ICD-10-CM

## 2014-11-20 DIAGNOSIS — D539 Nutritional anemia, unspecified: Secondary | ICD-10-CM

## 2014-11-20 DIAGNOSIS — S72002D Fracture of unspecified part of neck of left femur, subsequent encounter for closed fracture with routine healing: Secondary | ICD-10-CM

## 2014-11-20 NOTE — Progress Notes (Signed)
Patient ID: Anne Anthony, female   DOB: 10/10/1936, 79 y.o.   MRN: [id number]   This is an acute visit.  Level care skilled.  Facility [facility].  Chief complaint-acute visit status post hospitalization for left hip fracture with repair.  History of present illness.  Patient is a pleasant 79 year old female with a history of atrial fibrillation and tachybradycardia syndrome status post pacemaker-also recent surgery for a hiatal hernia at [facility] 2 months ago.  Patient apparently fell at home and sustained a left hip fracture-she underwent a left hip arthroplasty on 11/16/2014 with recommendation for skilled nursing for rehabilitation.  In regards to atrial fibrillation she is on metoprolol as well as amiodarone this is rate controlled.  Patient also had a listed history of anemia and hemoglobin we obtained today is 7.4 this is down from 8.5 on hospital discharge a couple days ago-it appears this is macrocytic with MCV of 107.3.  She does not complain of any increased shortness of breath chest pain or increased weakness from baseline.  Currently she has no acute complaints she is resting in bed comfortably.  Previous medical history.  Close left hip fracture status post repair.  Atrial fibrillation-tachybradycardia syndrome status post pacemaker.  Hyperlipidemia.  Chronic anticoagulation with Coumadin.  Protein calorie malnutrition.  GERD.  Medications.  Tylenol 325-650 milligrams by mouth every 4 hours when necessary Colace 100 mg twice a day.  Lovenox 40 mg daily.  Ferrous sulfate 325 mg 3 times a day.  MiraLAX daily.  Tramadol 50 mg every 6 hours when necessary Vicodin 03/15/2024 milligrams one tablet every 6 hours when necessary pain.  Amiodarone 200 mg daily.  Dexalone 60 mg daily.  Lexapro 5 mg daily.  Lopressor 25 mg twice a day.  Zocor 20 mg daily.  Coumadin 2 mg daily except 1 mg on Fridays.  Ambien 5 mg daily at bedtime when  necessary.  Lasix 20 mg daily.  Neurontin 300 mg daily at bedtime.    Family medical social history reviewed per history and physical on generally second 2015-I do note he family history of colon cancer.  Review of systems.  General no complaints of fever chills.  Skin does not complain of rashes or itching does have surgical site left hip with orders not to remove the dressing there is some bruising around the area.  Eyes does not complain of visual changes.  Ear nose mouth and throat does not complain of sore throat or nasal discharge.  Respiratory does not complain of shortness breath or cough.  Cardiac fairly extensive history of atrial fibrillation but does not complain of chest pain or palpitations.  GI does not complain of abdominal discomfort has a significant history of GERD does not complain of diarrhea or constipation.  Or nausea or vomiting.  GU is not complaining of dysuria.  Musculoskeletal at times will have some hip discomfort apparently the Vicodin is helping.  Neurologic does not complain of dizziness headache or syncopal-type feelings she is on Neurontin I suspect for some history of neuropathy.  Psych does not complain of acute anxiety or depression she is on Lexapro for depression however.  Physical exam.  Temperature is 97.7 pulse 70 respirations 18 blood pressure 124/51 O2 saturation is 99% on room air.  General this is a pleasant elderly female in no distress lying comfortably in bed.  Her skin is warm and dry there is a dressing over the left surgical wound there is some violaceous bruising around the area.  I  do not see any active drainage or surrounding erythema.  Eyes pupils appear reactive light sclerae and conjunctivae are clear visual acuity appears grossly intact.  Oropharynx clear mucous membranes moist.  Chest is clear to auscultation there is no labored breathing.  Heart is regular rate and rhythm without murmur gallop or rub she  does not really have significant lower extremity edema some mild postop edema around the surgical site-pedal pulses are intact bilaterally.  Abdomen is soft nontender with positive bowel sounds there is dressing over an old PEG site.  Muscle skeletal does move all extremities again limited on the left lower extremity status post surgery grip strength appears to be stable bilaterally I do not see any deformities.  Neurologic is grossly intact her speech is clear no lateralizing findings.  Psych she is alert and oriented pleasant and appropriate.  Labs.  11/20/2014.  CBC 7.4 hemoglobin 7.4 hemoglobin 189 MCV elevated at 107.3.  Sodium 135 potassium 3.8 BUN 21 creatinine 0.85-albumin 2.2 calcium 7.7 phosphatase 36 otherwise liver function tests within normal limits.  Assessment and plan.  #1 status post left hip fracture with repair-at this point appears stable but will have to be monitored especially in light of decreasing hemoglobin-clinically she appears stable will need PT and OT she is on Lovenox as well as Coumadin for anticoagulation with associated atrial fibrillation-will check an INR tomorrow.  #2 atrial fibrillation this appears rate controlled on amiodarone and Lopressor-again she is on aggressive anticoagulation again as noted above she is on anticoagulation check an INR tomorrow at this point appears stable.  #3-anemia-this is macrocytic-I discussed this with Dr. Dellia Nims via phone and will order lab work including a B12 RBC folate-iron panel and peripheral smear for pathology as well as reticulocyte count.  Also will update a CBC tomorrow to follow-up on this-clinically she does not appear to be unstable certainly but this will have to be watched closely  I did attempt a stool guaiac although I obtainedminimal sample it appeared to be negative.  History of GERD-she continues on a PPI this appears to be stable.  Hyperlipidemia she is on a statin.  Depression-this appears  stable on Lexapro.  Question neuropathy-she is on Neurontin bedtime she does not complain of numbness or acute pain this evening.  Of note she also is on low-dose Lasix apparently she's been on this for some time will continue to monitor--I did review echocardiogram done back in August 2015 which showed low normal ejection fraction of 50-55 percent   [id number]-of note greater than 35 minutes spent assessing patient-reviewing her chart-and coordinating and formulating a plan of care for numerous diagnoses-of note greater than 50% of time spent coordinating plan of care

## 2014-11-23 ENCOUNTER — Encounter: Payer: Self-pay | Admitting: Internal Medicine

## 2014-11-23 ENCOUNTER — Ambulatory Visit: Payer: Medicare Other | Admitting: Pharmacist Clinician (PhC)/ Clinical Pharmacy Specialist

## 2014-11-23 ENCOUNTER — Non-Acute Institutional Stay (SKILLED_NURSING_FACILITY): Payer: Medicare Other | Admitting: Internal Medicine

## 2014-11-23 DIAGNOSIS — I482 Chronic atrial fibrillation, unspecified: Secondary | ICD-10-CM

## 2014-11-23 DIAGNOSIS — S72002D Fracture of unspecified part of neck of left femur, subsequent encounter for closed fracture with routine healing: Secondary | ICD-10-CM

## 2014-11-23 DIAGNOSIS — D62 Acute posthemorrhagic anemia: Secondary | ICD-10-CM | POA: Insufficient documentation

## 2014-11-23 DIAGNOSIS — E43 Unspecified severe protein-calorie malnutrition: Secondary | ICD-10-CM

## 2014-11-23 DIAGNOSIS — T8189XA Other complications of procedures, not elsewhere classified, initial encounter: Secondary | ICD-10-CM

## 2014-11-24 NOTE — Progress Notes (Signed)
Patient ID: Anne Anthony, female   DOB: 1936/07/19, 79 y.o.   MRN: [id number]               HISTORY & PHYSICAL  DATE:  11/23/2014          FACILITY: [facility]    LEVEL OF CARE:   SNF   CHIEF COMPLAINT:  Admission to SNF, post stay at [facility], 11/14/2014 through 11/18/2014.    HISTORY OF PRESENT ILLNESS:  This is an independent woman who lives in her own home in [place].  She is independent with ADLs and IADLs, and does not use an ambulatory assist device.  She tells me that one day prior to admission, she had a fall on her outside cement porch.  She landed on her left side.  She had pain, but was still able to ambulate with considerable discomfort.  Roughly 24 hours later, she was on the way to the kitchen to make a sandwich, by that time having significant trouble walking.  She turned, felt a pop, and down she went again.  She was found to have a left hip fracture.  She denies chest pain or loss of consciousness.     She has a history of sick sinus syndrome, status post pacemaker insertion.    She was on Coumadin.  Her INR was reversed.  She was started on Lovenox and Coumadin until her INR was therapeutic.    She required IV metoprolol and oral amiodarone.  I am not completely certain how much of this was new.    She has had recent laparoscopic surgery in [facility] for what seems to be a hiatal hernia.     Her discharge summary from this admission lists severe protein calorie malnutrition, although her albumin level on 11/14/2014 was normal.    The last problem I was called about on Friday was that she had macrocytosis with an MCV of 105, an MCHC of 31.4 on arrival to hospital.    LABS/RADIOLOGY:  Follow-up lab work from 11/21/2014 shows a hemoglobin of 8, an MCV of 106 and MCH of 34.2, both elevated.  Interestingly, these parameters were normal in October 2015 with a hemoglobin of 13.3, an MCV of 98, an MCHC of 33.8, normal differential count, white count and  platelet count all normal on 08/20/2014.  She has already been found to be OB-positive here.    She seems to have had an endo/colon in October 2015.  It seems that she had a polyp, if I am reading the report correctly.  She was discovered to have a large hiatal hernia.  There is a comment about her esophagus that I cannot read.  Presumably, this is why she was sent for surgery.  The pathology was a tubular adenoma on a colonic polyp in the cecum without high-grade dysplasia.    PAST MEDICAL HISTORY/PROBLEM LIST:              Chronic atrial fibrillation with sick sinus syndrome.  Status post pacemaker.    Hyperlipidemia.    Gastroesophageal reflux with a large hiatal hernia, recently surgically repaired.    CURRENT MEDICATIONS:  Discharge medications include:      Dexilant 60 mg once a day.    Colace 100 b.i.d.    Enoxaparin 40 mg subcutaneously once a day.    Escitalopram.    Ferrous sulfate 325 three times a day.    Lasix 20 q.d.    Norco 5/325, 1 tablet q.6 p.r.n.  Metoprolol 25 twice a day.    MiraLAX 17 g once a day.    Neurontin 300 at bedtime.    Amiodarone 200 mg once a day.     Simvastatin 20 mg in the evening.    Tramadol 50 mg q.6. p.r.n.    Coumadin 3 mg orally once a day.  Coumadin is not new for her.  She was on this at home.  It was reversed prior to surgery, as would be expected.    SOCIAL HISTORY:   TOBACCO USE:  The patient is a nonsmoker.   FUNCTIONAL STATUS:  Did not use an ambulatory assist device.  No history of falls.  She tells me that she had a DEXA scan and was not told that she had osteoporosis.  Independent with ADLs and IADLs.    FAMILY HISTORY:     CHILDREN:  Has four children.  All live in her area.    REVIEW OF SYSTEMS:   CHEST/RESPIRATORY:  No exertional shortness of breath.   CARDIAC:   No chest pain or syncope.   GI:  No abdominal pain or diarrhea.   GU:  No dysuria.    PHYSICAL EXAMINATION:   VITAL SIGNS:   O2 SATURATIONS:   95% on room air.   RESPIRATIONS:  20 and unlabored.    PULSE:  81 and irregular.   GENERAL APPEARANCE:  The patient is not in any distress.  Alert, conversational.  Able to give her own history.   CHEST/RESPIRATORY:  Clear air entry bilaterally.   CARDIOVASCULAR:  CARDIAC:  Other than the irregular heart rhythm, no murmurs.  She appears to be euvolemic.  No S3. GASTROINTESTINAL:  ABDOMEN:   Soft.  No tenderness.  No masses.  There is a small draining area, I think at the site of one of her laparoscopic incisions.  This will need to be probed and cultured.  There is tenderness above this, which is somewhat concerning.  Her surgery was done by a Dr. Garner Nash.   CIRCULATION:  EDEMA/VARICOSITIES:  Extremities:  The left leg has no signs of a DVT.   NEUROLOGICAL:    SENSATION/STRENGTH:  She has good function of her left foot.   There are no signs of neurologic injury.      ASSESSMENT/PLAN:                     Left hip fracture.  She had a left hip hemiarthroplasty on 11/16/2014.  Says she does not have osteoporosis based on a DEXA scan, although she certainly looks like the part.  She is on Lovenox, converting back to her Coumadin for atrial fibrillation, which should suffice for DVT prophylaxis.    Atrial fibrillation.  She received IV metoprolol and I think was on amiodarone prior to the surgery.  Her rate is currently controlled, but irregular.    Macrocytic anemia (not microcytic anemia as listed a discharge summary).   She is OB-positive.  However, she has had recent endo/colon and recent upper abdominal surgery.  In terms of the macrocytic anemia, given the fact that this was completely normal in October, I think this is probably either reticulocytosis or possibly folic acid deficiency.  I am doubtful this represents B12 deficiency or myelodysplastic syndrome.  Relevant studies are off.    OB-positivity.  As discussed above, she has had recent surgery and endo/colon as noted.  Her hemoglobin  will need to be monitored given the fact that she is on Lovenox and  Coumadin, however.      Listed as having severe protein calorie malnutrition.  She certainly looks the part, although her chemical indices do not really reflect this.    Nonhealing surgical wound.  She has had laparoscopic surgery at [facility] by Dr. Garner Nash.  She has a small open area that does not look closed at one of the sites.  More worrisome than this, there is some tenderness above it.  I am going to probe and culture this.   A trip back to see Dr. Garner Nash is probably in order.     CPT CODE: 15176                       ADDENDUM:    I have spoken to her more about the surgery, which was apparently on November 10th.  After she was discharged, she apparently continued to lose weight.  She mentions 130 down to 108, which would represent a 22-pound weight loss.  She states she simply does not want food, although she does not describe dysphagia or odynophagia, or abdominal pain.  She states she "vomits phlegm".    I am going to arrange a trip back to see Dr. Garner Nash.  I guess the issue would be whether she needs an endoscopy at this point.    Furthermore, her actual wound probes 2 cm.  I will culture this.  I am going to start her on antibiotics out of concern for possible infection here.

## 2014-11-25 ENCOUNTER — Other Ambulatory Visit: Payer: Self-pay | Admitting: *Deleted

## 2014-11-25 MED ORDER — HYDROCODONE-ACETAMINOPHEN 5-325 MG PO TABS: 1.0000 | ORAL_TABLET | Freq: Four times a day (QID) | ORAL | Status: DC | PRN

## 2014-11-25 NOTE — Telephone Encounter (Signed)
[facility] 

## 2014-11-30 ENCOUNTER — Non-Acute Institutional Stay (SKILLED_NURSING_FACILITY): Payer: Medicare Other | Admitting: Internal Medicine

## 2014-11-30 DIAGNOSIS — E43 Unspecified severe protein-calorie malnutrition: Secondary | ICD-10-CM

## 2014-11-30 DIAGNOSIS — T8189XA Other complications of procedures, not elsewhere classified, initial encounter: Secondary | ICD-10-CM

## 2014-11-30 DIAGNOSIS — R195 Other fecal abnormalities: Secondary | ICD-10-CM

## 2014-12-01 ENCOUNTER — Non-Acute Institutional Stay (SKILLED_NURSING_FACILITY): Payer: Medicare Other | Admitting: Internal Medicine

## 2014-12-01 ENCOUNTER — Encounter: Payer: Self-pay | Admitting: Internal Medicine

## 2014-12-01 DIAGNOSIS — R059 Cough, unspecified: Secondary | ICD-10-CM

## 2014-12-01 DIAGNOSIS — R0981 Nasal congestion: Secondary | ICD-10-CM

## 2014-12-01 DIAGNOSIS — R05 Cough: Secondary | ICD-10-CM

## 2014-12-01 NOTE — Progress Notes (Signed)
Patient ID: Anne Anthony, female   DOB: 1936-01-16, 79 y.o.   MRN: [id number]          This is an acute visit.  Level care skilled.  Facility [facility].  Chief complaint-acute visit secondary to nasal drainage and congestion as well as occasional cough.  History of present illness.  Patient is a pleasant 79 year old female here after undergoing a left hip repair she also has a history of atrial fibrillation as well as occult positive stool this is followed by Dr. Dellia Nims.  Apparently she's developed some nasal drainage and congestion and I'm seeing her for this.  Her vital signs are stable she does not complain of shortness of breath does complain of occasional cough.  Family medical social history as been reviewed per admission note on [place] 11 2016.  Medications have been reviewed per MAR.  Review of systems.  General no complaints of fever or chills.  Head ears eyes nose mouth and throat complaints of nasal drainage and congestion apparently drainage is clear-also complains of an occasional cough.  Rest. Does not complain of shortness of breath again does have a cough.  Cardiac does not complain of chest pain does have a history of atrial fibrillation.  Neurologic is not complaining of headaches or dizziness.  Physical exam.  Temperature is 98.1 pulse 68 respirations 20 blood pressure 138/62.  In general this a pleasant elderly female in no distress.  Her skin is warm and dry.  Nose he does have a small amount of clear drainage.  Oropharynx is clear possibly a small amount of drainage here as well.  Chest is clear to auscultation no labored breathing.  Heart is regular rate and rhythm she does not have significant lower extremity edema.  Labs.  11/25/2014.  WBC 6.9 hemoglobin 8.3 platelets 3:15.  Assessment and plan.  #1-nasal congestion with cough-we will treat with Flonase 1 spray twice a day for 5 days as well as Mucinex 600 mg twice a day for  5 days and monitor her vital signs pulse ox every shift for 48 hours.  Clinically she appears to be doing well but will have to be monitored.  [id number]

## 2014-12-02 NOTE — Progress Notes (Signed)
Patient ID: Anne Anthony, female   DOB: 1935/11/22, 79 y.o.   MRN: [id number]               PROGRESS NOTE  DATE:  11/30/2014               FACILITY: [facility]                LEVEL OF CARE:   SNF   Acute Visit   CHIEF COMPLAINT:  Continued weight loss, OB-positivity,   HISTORY OF PRESENT ILLNESS:  This is a patient who came to [place] after suffering a left hip fracture.  She has a history of sick sinus syndrome, status post pacemaker insertion.  She is on Coumadin.    She also had surgery to correct a large hiatal hernia in November.  She also had a colonoscopy.    Since her arrival here and subsequently, she continues to complain about having difficulty eating, regurgitating phlegm, early satiety, etc.  She has apparently continued to lose weight.    PHYSICAL EXAMINATION:           GENERAL APPEARANCE:  The patient is not in any distress.     CARDIOVASCULAR:  CARDIAC:  Other than an irregular heart rate, no murmurs.  She continues to be euvolemic.   GASTROINTESTINAL:  ABDOMEN:   Soft.  No masses.  There is a small open area, I believe at the site of her laparoscopic incisions.  I cultured this earlier.  It was negative, although I did give her antibiotics.  The area is certainly less tender, although there continues to be purulent drainage.     ASSESSMENT/PLAN:                                 OB-positivity.  It turns out that she is still on enoxaparin as well as her Coumadin.  That can certainly stop.  She is not on aspirin.  She is on iron.  She had a polyp removed and also a hiatal hernia surgery recently.    Draining site on her abdomen.  This is less tender.  However, it did have purulent drainage and I looked at this today.  I did re-culture this area.    Early satiety.  ?Severe reflux. And significant weight loss  I will send her back to her surgeon.  It turns out that her gastroenterologist was Dr. Gala Romney.  I may need to send her back to see him, as well.     Weight loss.  She has lost 11 pounds, 11/19/2014 through 11/30/2014.  She has supplements.

## 2014-12-07 ENCOUNTER — Non-Acute Institutional Stay (SKILLED_NURSING_FACILITY): Payer: Medicare Other | Admitting: Internal Medicine

## 2014-12-07 DIAGNOSIS — S72002D Fracture of unspecified part of neck of left femur, subsequent encounter for closed fracture with routine healing: Secondary | ICD-10-CM

## 2014-12-07 DIAGNOSIS — I482 Chronic atrial fibrillation, unspecified: Secondary | ICD-10-CM

## 2014-12-07 DIAGNOSIS — T8189XA Other complications of procedures, not elsewhere classified, initial encounter: Secondary | ICD-10-CM

## 2014-12-08 NOTE — Progress Notes (Signed)
Patient ID: Anne Anthony, female   DOB: [birth year], 79 y.o.   MRN: [id number]               PROGRESS NOTE  DATE:  12/07/2014              FACILITY: [facility]      LEVEL OF CARE:   SNF   Acute Visit   CHIEF COMPLAINT:  Follow up medical issues, ?pre-discharge.    HISTORY OF PRESENT ILLNESS:  This is an independent woman who lives in [place].  She was admitted to [place] after a left hip fracture and is status post left hip hemiarthroplasty.  From that point of view, she is up walking with a walker.  She appears to be doing well and is ready to go home, or at least close to it.    Her INR today is 4.  This is on Coumadin 3 mg.  It appears that her baseline dose may have been 2.5, looking back on a note from 10/05/2014 on [facility].      Other problems include a potassium of 3.3, a CO2 of 33 (she is on Lasix 20 mg).  Her albumin is 2.9.    She lost about 11 pounds by our scales, from 11/16/2014 to 11/19/2014, although her weight yesterday was 108.6 which is where her daughter states her baseline weight was.    Finally, she has a hemoglobin of 9.6 with an MCV of 108.6.  I think this is probably reticulocytosis and her hemoglobin is gradually increasing.    PHYSICAL EXAMINATION:             GENERAL APPEARANCE:  The patient looks well.   CHEST/RESPIRATORY:  Clear air entry bilaterally.   CARDIOVASCULAR:  CARDIAC:   Heart sounds are normal.  She appears to be euvolemic.   GASTROINTESTINAL:  ABDOMEN:   The small open site (possibly where her gastrostomy tube was surrounding her laparoscopic hiatal hernia repair) actually looks as though it is resolving.    ASSESSMENT/PLAN:              Excessively anticoagulated.  It would appear that her baseline dose is 2.5.   I have put her Coumadin on hold and we will recheck this on Wednesday.  If she goes home, this will need to be done through the Coumadin Clinic.    Hypokalemia.  I will replace this.  I see no reason at  present for her Lasix at 20 as she may be developing a contraction alkalosis, as well.  I will stop this and replace her potassium.    Status post hip surgery.  She has done well.  She is up walking with a walker.  She will probably need this when she goes home.    Small draining sinus at a site of a previous gastrostomy tube. This appears to be healing as well we have been using silver alginate on this area   Addendum; as I understand things her family has insisted on taking her home. She will need skilled nursing PT OT. She will need a PT and INR faxed to the Coumadin clinic on Wednesday. She appears to be doing well walking in the hallway with a walker.

## 2014-12-09 ENCOUNTER — Ambulatory Visit (INDEPENDENT_AMBULATORY_CARE_PROVIDER_SITE_OTHER): Payer: Medicare Other | Admitting: Pharmacist Clinician (PhC)/ Clinical Pharmacy Specialist

## 2014-12-09 DIAGNOSIS — Z7901 Long term (current) use of anticoagulants: Secondary | ICD-10-CM

## 2014-12-09 DIAGNOSIS — I482 Chronic atrial fibrillation, unspecified: Secondary | ICD-10-CM

## 2014-12-09 DIAGNOSIS — I4891 Unspecified atrial fibrillation: Secondary | ICD-10-CM

## 2014-12-09 LAB — POCT INR: INR: 2.2

## 2014-12-14 ENCOUNTER — Ambulatory Visit: Payer: Medicare Other | Attending: Orthopedic Surgery | Admitting: Physical Therapy

## 2014-12-14 DIAGNOSIS — R5381 Other malaise: Secondary | ICD-10-CM | POA: Diagnosis not present

## 2014-12-14 DIAGNOSIS — R293 Abnormal posture: Secondary | ICD-10-CM | POA: Diagnosis not present

## 2014-12-14 DIAGNOSIS — M5442 Lumbago with sciatica, left side: Secondary | ICD-10-CM | POA: Insufficient documentation

## 2014-12-18 ENCOUNTER — Ambulatory Visit: Payer: Medicare Other | Admitting: Physical Therapy

## 2014-12-18 DIAGNOSIS — M5442 Lumbago with sciatica, left side: Secondary | ICD-10-CM | POA: Diagnosis not present

## 2014-12-21 ENCOUNTER — Ambulatory Visit: Payer: Medicare Other | Admitting: Physical Therapy

## 2014-12-21 DIAGNOSIS — M5442 Lumbago with sciatica, left side: Secondary | ICD-10-CM | POA: Diagnosis not present

## 2014-12-24 ENCOUNTER — Ambulatory Visit: Payer: Medicare Other | Admitting: *Deleted

## 2014-12-24 DIAGNOSIS — M5442 Lumbago with sciatica, left side: Secondary | ICD-10-CM | POA: Diagnosis not present

## 2014-12-28 ENCOUNTER — Encounter: Payer: Medicare Other | Admitting: Physical Therapy

## 2014-12-29 ENCOUNTER — Other Ambulatory Visit: Payer: Self-pay | Admitting: Pharmacist Clinician (PhC)/ Clinical Pharmacy Specialist

## 2014-12-29 MED ORDER — WARFARIN SODIUM 2 MG PO TABS: ORAL_TABLET | ORAL | Status: DC

## 2015-01-01 ENCOUNTER — Ambulatory Visit (INDEPENDENT_AMBULATORY_CARE_PROVIDER_SITE_OTHER): Payer: Medicare Other | Admitting: Pharmacist Clinician (PhC)/ Clinical Pharmacy Specialist

## 2015-01-01 DIAGNOSIS — I482 Chronic atrial fibrillation, unspecified: Secondary | ICD-10-CM

## 2015-01-01 DIAGNOSIS — Z7901 Long term (current) use of anticoagulants: Secondary | ICD-10-CM

## 2015-01-01 DIAGNOSIS — I4891 Unspecified atrial fibrillation: Secondary | ICD-10-CM

## 2015-01-01 LAB — POCT INR: INR: 2.5

## 2015-01-04 ENCOUNTER — Ambulatory Visit: Payer: Medicare Other | Admitting: Pharmacist Clinician (PhC)/ Clinical Pharmacy Specialist

## 2015-01-05 ENCOUNTER — Telehealth: Payer: Self-pay | Admitting: Neurology

## 2015-01-05 NOTE — Telephone Encounter (Signed)
Pt called to cancel her f/u appt for 01/08/15. She stated that she is doing well and did not need to be seen.

## 2015-01-07 ENCOUNTER — Ambulatory Visit: Payer: Medicare Other | Admitting: Physical Therapy

## 2015-01-07 ENCOUNTER — Encounter: Payer: Self-pay | Admitting: Physical Therapy

## 2015-01-07 DIAGNOSIS — M5442 Lumbago with sciatica, left side: Secondary | ICD-10-CM | POA: Diagnosis not present

## 2015-01-07 DIAGNOSIS — R29898 Other symptoms and signs involving the musculoskeletal system: Secondary | ICD-10-CM

## 2015-01-07 DIAGNOSIS — M25552 Pain in left hip: Secondary | ICD-10-CM

## 2015-01-07 NOTE — Therapy (Signed)
[facility] [place], [place], [address] Phone: [phone]   Fax:  [phone]  Physical Therapy Treatment  Patient Details  Name: Anne Anthony MRN: [id number] Date of Birth: [birth year] Referring Provider:  Sharilyn Sites, MD  Encounter Date: 01/07/2015      PT End of Session - 01/07/15 1637    Visit Number 5   Number of Visits 12   PT Start Time 6767   PT Stop Time 0435   PT Time Calculation (min) 40 min      Past Medical History  Diagnosis Date  . Atrial flutter     successful radiofrequency ablation 2007  . Paroxysmal atrial fibrillation   . Tachycardia-bradycardia syndrome     permament pacermaker, dual chamber Medtronic on 06/2011  . Hypertension   . Hyperlipidemia   . Pacemaker   . Tubular adenoma   . Hiatal hernia   . Diverticula of colon   . Family history of colon cancer     brother  . Severe protein-calorie malnutrition 11/17/2014    Past Surgical History  Procedure Laterality Date  . Permament pacemaker  06/2011    dual chamber , Medtronic Adapta serial # [id number]  last checked 02/05/2013  . Hiatal hernia repair  2005  . Radiofrequency ablation  06/28/2006  . Colonoscopy  08/17/2010    Dr. Gala Romney- internal hemorrhoids, o/w normal rectum, L sided diverticula, tubular adenoma  . Esophagogastroduodenoscopy  10/502011    Dr. Gala Romney- huge diaphragmatic/hiatial hernia, fundal gland polyps not manipulated o/w noral appearing gastric mucosa, patent pylorus, normal D1 & D2  . Partial hysterectomy    . Abdominal surgery    . Hip arthroplasty Left 11/16/2014    Procedure: ARTHROPLASTY BIPOLAR HIP;  Surgeon: Mauri Pole, MD;  Location: [facility];  Service: Orthopedics;  Laterality: Left;    There were no vitals taken for this visit.  Visit Diagnosis:  Left hip pain  Weakness of left hip      Subjective Assessment - 01/07/15 1600    Symptoms My left hip is feeling stronger   Patient Stated Goals  Walk without pain.   Currently in Pain? Yes   Pain Score 2    Pain Orientation Left   Pain Descriptors / Indicators Aching   Pain Type Surgical pain   Pain Onset More than a month ago   Pain Frequency Constant   Aggravating Factors  Walking a lot.   Pain Relieving Factors Rest.   Multiple Pain Sites No                    [facility] Adult PT Treatment/Exercise - 01/07/15 0001    Exercises   Exercises Knee/Hip   Knee/Hip Exercises: Aerobic   Stationary Bike Nustep Level 4 x 10 minutes within hip precaution guidelines   Knee/Hip Exercises: Standing   Knee Flexion AROM;3 sets;10 reps   Knee Flexion Limitations 80 degrees   Hip ADduction AROM;3 sets;10 reps   Knee/Hip Exercises: Seated   Long Arc Quad AROM;3 sets;10 reps   Long Arc Quad Weight 3 lbs.   Knee/Hip Exercises: Sidelying   Hip ABduction AAROM;3 sets;10 reps             Balance Exercises - 01/07/15 1611    Balance Exercises: Standing   Rockerboard Other time (comment)  3 minutes                PT Long Term Goals - 01/07/15 1643  PT LONG TERM GOAL #1   Title Independent with advanced HEP   Time 4   Period Weeks   Status New   PT LONG TERM GOAL #2   Title Increase left LE strength to 5/5.   Time 4   Period Weeks   Status New   PT LONG TERM GOAL #3   Title Eliminate Trendelburg gait   Time 4   Period Weeks   Status New   PT LONG TERM GOAL #4   Title Perform a reciprocating stair git with one railing.   Time 4   Period Weeks   Status New   PT LONG TERM GOAL #5   Title Walk 500 feet with a straight cane   Time 4   Period Weeks               Problem List Patient Active Problem List   Diagnosis Date Noted  . Nasal congestion 12/01/2014  . Postoperative anemia due to acute blood loss 11/23/2014  . Protein-calorie malnutrition, severe 11/17/2014  . Severe protein-calorie malnutrition 11/17/2014  . Anticoagulated on Coumadin   . Left displaced femoral neck fracture    . Hip fracture 11/14/2014  . Closed left hip fracture 11/14/2014  . Chronic atrial fibrillation 11/14/2014  . Back pain 05/14/2014  . Paroxysmal atrial fibrillation   . Tachycardia-bradycardia syndrome   . Hypertension   . Hyperlipidemia   . Pacemaker   . Atrial fibrillation 01/19/2013  . Long term current use of anticoagulant therapy 01/19/2013  . GERD 08/17/2010    Delano Frate, [place] MPT 01/07/2015, 4:46 PM  [facility] [address] [place], [place], [address] Phone: [phone]   Fax:  [phone]

## 2015-01-08 ENCOUNTER — Ambulatory Visit: Payer: Medicare Other | Admitting: Neurology

## 2015-01-12 ENCOUNTER — Ambulatory Visit: Payer: Medicare Other | Attending: Orthopedic Surgery | Admitting: Physical Therapy

## 2015-01-12 DIAGNOSIS — R293 Abnormal posture: Secondary | ICD-10-CM | POA: Insufficient documentation

## 2015-01-12 DIAGNOSIS — M5442 Lumbago with sciatica, left side: Secondary | ICD-10-CM | POA: Insufficient documentation

## 2015-01-12 DIAGNOSIS — M25552 Pain in left hip: Secondary | ICD-10-CM

## 2015-01-12 DIAGNOSIS — R5381 Other malaise: Secondary | ICD-10-CM | POA: Diagnosis not present

## 2015-01-12 NOTE — Therapy (Signed)
[facility] [place], [place], [address] Phone: [phone]   Fax:  [phone]  Physical Therapy Treatment  Patient Details  Name: Anne Anthony MRN: [id number] Date of Birth: 1936-08-05 Referring Provider:  Sharilyn Sites, MD  Encounter Date: 01/12/2015      PT End of Session - 01/12/15 1130    Visit Number 6   Number of Visits 12   PT Start Time 1115   PT Stop Time 1149   PT Time Calculation (min) 34 min      Past Medical History  Diagnosis Date  . Atrial flutter     successful radiofrequency ablation 2007  . Paroxysmal atrial fibrillation   . Tachycardia-bradycardia syndrome     permament pacermaker, dual chamber Medtronic on 06/2011  . Hypertension   . Hyperlipidemia   . Pacemaker   . Tubular adenoma   . Hiatal hernia   . Diverticula of colon   . Family history of colon cancer     brother  . Severe protein-calorie malnutrition 11/17/2014    Past Surgical History  Procedure Laterality Date  . Permament pacemaker  06/2011    dual chamber , Medtronic Adapta serial # [id number]  last checked 02/05/2013  . Hiatal hernia repair  2005  . Radiofrequency ablation  06/28/2006  . Colonoscopy  08/17/2010    Dr. Gala Romney- internal hemorrhoids, o/w normal rectum, L sided diverticula, tubular adenoma  . Esophagogastroduodenoscopy  10/502011    Dr. Gala Romney- huge diaphragmatic/hiatial hernia, fundal gland polyps not manipulated o/w noral appearing gastric mucosa, patent pylorus, normal D1 & D2  . Partial hysterectomy    . Abdominal surgery    . Hip arthroplasty Left 11/16/2014    Procedure: ARTHROPLASTY BIPOLAR HIP;  Surgeon: Mauri Pole, MD;  Location: [facility];  Service: Orthopedics;  Laterality: Left;    There were no vitals taken for this visit.  Visit Diagnosis:  Left hip pain      Subjective Assessment - 01/12/15 1128    Symptoms Minimal pain.  Left hip feels heavy when walking a lot.   Pain Score 2    Pain  Descriptors / Indicators Aching                    [facility] Adult PT Treatment/Exercise - 01/12/15 0001    Exercises   Exercises Knee/Hip   Knee/Hip Exercises: Aerobic   Stationary Bike --  Nustep level 4 x 15 minutes.   Knee/Hip Exercises: Standing   Knee Flexion --  Marching in parallel bars 3 x 10.   Rocker Board 5 minutes   Knee/Hip Exercises: Sidelying   Hip ABduction AAROM;3 sets;10 reps     Standing in parallel bars for support:  Bilateral Hip abduction 3 x10.                PT Long Term Goals - 01/07/15 1643    PT LONG TERM GOAL #1   Title Independent with advanced HEP   Time 4   Period Weeks   Status New   PT LONG TERM GOAL #2   Title Increase left LE strength to 5/5.   Time 4   Period Weeks   Status New   PT LONG TERM GOAL #3   Title Eliminate Trendelburg gait   Time 4   Period Weeks   Status New   PT LONG TERM GOAL #4   Title Perform a reciprocating stair git with one railing.   Time 4  Period Weeks   Status New   PT LONG TERM GOAL #5   Title Walk 500 feet with a straight cane   Time 4   Period Weeks               Plan - 01/12/15 1158    PT Next Visit Plan Continue with current POC.        Problem List Patient Active Problem List   Diagnosis Date Noted  . Nasal congestion 12/01/2014  . Postoperative anemia due to acute blood loss 11/23/2014  . Protein-calorie malnutrition, severe 11/17/2014  . Severe protein-calorie malnutrition 11/17/2014  . Anticoagulated on Coumadin   . Left displaced femoral neck fracture   . Hip fracture 11/14/2014  . Closed left hip fracture 11/14/2014  . Chronic atrial fibrillation 11/14/2014  . Back pain 05/14/2014  . Paroxysmal atrial fibrillation   . Tachycardia-bradycardia syndrome   . Hypertension   . Hyperlipidemia   . Pacemaker   . Atrial fibrillation 01/19/2013  . Long term current use of anticoagulant therapy 01/19/2013  . GERD 08/17/2010    Emmalie Haigh, [place]  MPT 01/12/2015, 12:06 PM  [facility] [address] [place], [place], [address] Phone: [phone]   Fax:  [phone]

## 2015-01-27 ENCOUNTER — Emergency Department (HOSPITAL_COMMUNITY)
Admission: EM | Admit: 2015-01-27 | Discharge: 2015-01-27 | Disposition: A | Discharge status: Home / Self Care | Payer: Medicare Other | Attending: Emergency Medicine | Admitting: Emergency Medicine

## 2015-01-27 ENCOUNTER — Emergency Department (HOSPITAL_COMMUNITY): Payer: Medicare Other

## 2015-01-27 ENCOUNTER — Encounter (HOSPITAL_COMMUNITY): Payer: Self-pay | Admitting: Emergency Medicine

## 2015-01-27 DIAGNOSIS — W01198A Fall on same level from slipping, tripping and stumbling with subsequent striking against other object, initial encounter: Secondary | ICD-10-CM | POA: Insufficient documentation

## 2015-01-27 DIAGNOSIS — S0191XA Laceration without foreign body of unspecified part of head, initial encounter: Secondary | ICD-10-CM

## 2015-01-27 DIAGNOSIS — Y9301 Activity, walking, marching and hiking: Secondary | ICD-10-CM | POA: Insufficient documentation

## 2015-01-27 DIAGNOSIS — Y92009 Unspecified place in unspecified non-institutional (private) residence as the place of occurrence of the external cause: Secondary | ICD-10-CM | POA: Insufficient documentation

## 2015-01-27 DIAGNOSIS — Y998 Other external cause status: Secondary | ICD-10-CM | POA: Insufficient documentation

## 2015-01-27 DIAGNOSIS — I480 Paroxysmal atrial fibrillation: Secondary | ICD-10-CM | POA: Insufficient documentation

## 2015-01-27 DIAGNOSIS — Z950 Presence of cardiac pacemaker: Secondary | ICD-10-CM | POA: Insufficient documentation

## 2015-01-27 DIAGNOSIS — Z8719 Personal history of other diseases of the digestive system: Secondary | ICD-10-CM | POA: Insufficient documentation

## 2015-01-27 DIAGNOSIS — S0101XA Laceration without foreign body of scalp, initial encounter: Secondary | ICD-10-CM | POA: Insufficient documentation

## 2015-01-27 DIAGNOSIS — Z7901 Long term (current) use of anticoagulants: Secondary | ICD-10-CM | POA: Insufficient documentation

## 2015-01-27 DIAGNOSIS — I10 Essential (primary) hypertension: Secondary | ICD-10-CM | POA: Insufficient documentation

## 2015-01-27 DIAGNOSIS — Z79899 Other long term (current) drug therapy: Secondary | ICD-10-CM | POA: Insufficient documentation

## 2015-01-27 DIAGNOSIS — E785 Hyperlipidemia, unspecified: Secondary | ICD-10-CM | POA: Diagnosis not present

## 2015-01-27 DIAGNOSIS — S0990XA Unspecified injury of head, initial encounter: Secondary | ICD-10-CM | POA: Diagnosis present

## 2015-01-27 DIAGNOSIS — Z86018 Personal history of other benign neoplasm: Secondary | ICD-10-CM | POA: Insufficient documentation

## 2015-01-27 DIAGNOSIS — W19XXXA Unspecified fall, initial encounter: Secondary | ICD-10-CM

## 2015-01-27 LAB — CBC WITH DIFFERENTIAL/PLATELET
Basophils Absolute: 0.1 10*3/uL (ref 0.0–0.1)
Basophils Relative: 1 % (ref 0–1)
EOS ABS: 0.2 10*3/uL (ref 0.0–0.7)
Eosinophils Relative: 3 % (ref 0–5)
HCT: 39.9 % (ref 36.0–46.0)
Hemoglobin: 12.6 g/dL (ref 12.0–15.0)
Lymphocytes Relative: 25 % (ref 12–46)
Lymphs Abs: 1.6 10*3/uL (ref 0.7–4.0)
MCH: 31.9 pg (ref 26.0–34.0)
MCHC: 31.6 g/dL (ref 30.0–36.0)
MCV: 101 fL — AB (ref 78.0–100.0)
MONO ABS: 0.6 10*3/uL (ref 0.1–1.0)
MONOS PCT: 10 % (ref 3–12)
Neutro Abs: 3.9 10*3/uL (ref 1.7–7.7)
Neutrophils Relative %: 61 % (ref 43–77)
Platelets: 232 10*3/uL (ref 150–400)
RBC: 3.95 MIL/uL (ref 3.87–5.11)
RDW: 13.1 % (ref 11.5–15.5)
WBC: 6.4 10*3/uL (ref 4.0–10.5)

## 2015-01-27 LAB — BASIC METABOLIC PANEL
Anion gap: 6 (ref 5–15)
BUN: 15 mg/dL (ref 6–23)
CALCIUM: 8.9 mg/dL (ref 8.4–10.5)
CHLORIDE: 104 mmol/L (ref 96–112)
CO2: 27 mmol/L (ref 19–32)
Creatinine, Ser: 1.06 mg/dL (ref 0.50–1.10)
GFR calc Af Amer: 58 mL/min — ABNORMAL LOW (ref >90)
GFR calc non Af Amer: 50 mL/min — ABNORMAL LOW (ref >90)
GLUCOSE: 122 mg/dL — AB (ref 70–99)
Potassium: 4.2 mmol/L (ref 3.5–5.1)
SODIUM: 137 mmol/L (ref 135–145)

## 2015-01-27 LAB — URINALYSIS, ROUTINE W REFLEX MICROSCOPIC
Bilirubin Urine: NEGATIVE
Glucose, UA: NEGATIVE mg/dL
Hgb urine dipstick: NEGATIVE
Ketones, ur: NEGATIVE mg/dL
NITRITE: NEGATIVE
Protein, ur: NEGATIVE mg/dL
SPECIFIC GRAVITY, URINE: 1.009 (ref 1.005–1.030)
Urobilinogen, UA: 0.2 mg/dL (ref 0.0–1.0)
pH: 7 (ref 5.0–8.0)

## 2015-01-27 LAB — URINE MICROSCOPIC-ADD ON

## 2015-01-27 LAB — PROTIME-INR
INR: 2.09 — AB (ref 0.00–1.49)
Prothrombin Time: 23.7 seconds — ABNORMAL HIGH (ref 11.6–15.2)

## 2015-01-27 LAB — I-STAT TROPONIN, ED: Troponin i, poc: 0 ng/mL (ref 0.00–0.08)

## 2015-01-27 MED ORDER — ACETAMINOPHEN 325 MG PO TABS
650.0000 mg | ORAL_TABLET | Freq: Once | ORAL | Status: AC
Start: 2015-01-27 — End: 2015-01-27
  Administered 2015-01-27: 650 mg via ORAL
  Filled 2015-01-27: qty 2

## 2015-01-27 MED ORDER — LIDOCAINE HCL (PF) 1 % IJ SOLN
5.0000 mL | Freq: Once | INTRAMUSCULAR | Status: AC
  Administered 2015-01-27: 5 mL via INTRADERMAL
  Filled 2015-01-27: qty 5

## 2015-01-27 NOTE — ED Notes (Signed)
Pt fell today ("straight backwards") and has small laceration to posterior, lower part of head. Has small abrasions to left elbow. Patient is on Warfarin (Hx a-fib). Denies knowing if she felt dizziness before the fall. Has had multiple falls before and recently had partial hip replacement on left hip d/t fall (January 2016). Patient is ambulatory. Denies blurry vision.

## 2015-01-27 NOTE — Discharge Instructions (Signed)
Stitches, Staples, or Skin Adhesive Strips  Stitches (sutures), staples, and skin adhesive strips hold the skin together as it heals. They will usually be in place for 7 days or less. HOME CARE  Wash your hands with soap and water before and after you touch your wound.  Only take medicine as told by your doctor.  Cover your wound only if your doctor told you to. Otherwise, leave it open to air.  Do not get your stitches wet or dirty. If they get dirty, dab them gently with a clean washcloth. Wet the washcloth with soapy water. Do not rub. Pat them dry gently.  Do not put medicine or medicated cream on your stitches unless your doctor told you to.  Do not take out your own stitches or staples. Skin adhesive strips will fall off by themselves.  Do not pick at the wound. Picking can cause an infection.  Do not miss your follow-up appointment.  If you have problems or questions, call your doctor. GET HELP RIGHT AWAY IF:   You have a temperature by mouth above 102 F (38.9 C), not controlled by medicine.  You have chills.  You have redness or pain around your stitches.  There is puffiness (swelling) around your stitches.  You notice fluid (drainage) from your stitches.  There is a bad smell coming from your wound. MAKE SURE YOU:  Understand these instructions. Head Injury You have received a head injury. It does not appear serious at this time. Headaches and vomiting are common following head injury. It should be easy to awaken from sleeping. Sometimes it is necessary for you to stay in the emergency department for a while for observation. Sometimes admission to the hospital may be needed. After injuries such as yours, most problems occur within the first 24 hours, but side effects may occur up to 7-10 days after the injury. It is important for you to carefully monitor your condition and contact your health care provider or seek immediate medical care if there is a change in your  condition. WHAT ARE THE TYPES OF HEAD INJURIES? Head injuries can be as minor as a bump. Some head injuries can be more severe. More severe head injuries include: A jarring injury to the brain (concussion). A bruise of the brain (contusion). This mean there is bleeding in the brain that can cause swelling. A cracked skull (skull fracture). Bleeding in the brain that collects, clots, and forms a bump (hematoma). WHAT CAUSES A HEAD INJURY? A serious head injury is most likely to happen to someone who is in a car wreck and is not wearing a seat belt. Other causes of major head injuries include bicycle or motorcycle accidents, sports injuries, and falls. HOW ARE HEAD INJURIES DIAGNOSED? A complete history of the event leading to the injury and your current symptoms will be helpful in diagnosing head injuries. Many times, pictures of the brain, such as CT or MRI are needed to see the extent of the injury. Often, an overnight hospital stay is necessary for observation.  WHEN SHOULD I SEEK IMMEDIATE MEDICAL CARE?  You should get help right away if: You have confusion or drowsiness. You feel sick to your stomach (nauseous) or have continued, forceful vomiting. You have dizziness or unsteadiness that is getting worse. You have severe, continued headaches not relieved by medicine. Only take over-the-counter or prescription medicines for pain, fever, or discomfort as directed by your health care provider. You do not have normal function of the arms or  legs or are unable to walk. You notice changes in the black spots in the center of the colored part of your eye (pupil). You have a clear or bloody fluid coming from your nose or ears. You have a loss of vision. During the next 24 hours after the injury, you must stay with someone who can watch you for the warning signs. This person should contact local emergency services (911 in the [place] if you have seizures, you become unconscious, or you are unable to  wake up. HOW CAN I PREVENT A HEAD INJURY IN THE FUTURE? The most important factor for preventing major head injuries is avoiding motor vehicle accidents. To minimize the potential for damage to your head, it is crucial to wear seat belts while riding in motor vehicles. Wearing helmets while bike riding and playing collision sports (like football) is also helpful. Also, avoiding dangerous activities around the house will further help reduce your risk of head injury.  WHEN CAN I RETURN TO NORMAL ACTIVITIES AND ATHLETICS? You should be reevaluated by your health care provider before returning to these activities. If you have any of the following symptoms, you should not return to activities or contact sports until 1 week after the symptoms have stopped: Persistent headache. Dizziness or vertigo. Poor attention and concentration. Confusion. Memory problems. Nausea or vomiting. Fatigue or tire easily. Irritability. Intolerant of bright lights or loud noises. Anxiety or depression. Disturbed sleep. MAKE SURE YOU:  Understand these instructions. Will watch your condition. Will get help right away if you are not doing well or get worse. Document Released: 10/30/2005 Document Revised: 11/04/2013 Document Reviewed: 07/07/2013 [facility] Patient Information 2015 [place], [place]. This information is not intended to replace advice given to you by your health care provider. Make sure you discuss any questions you have with your health care provider.   Will watch your condition.  Will get help if you are not doing well or get worse. Document Released: 08/27/2009 Document Revised: 01/22/2012 Document Reviewed: 08/27/2009 [facility] Patient Information 2015 [place], [place]. This information is not intended to replace advice given to you by your health care provider. Make sure you discuss any questions you have with your health care provider.

## 2015-01-27 NOTE — BH Assessment (Signed)
Writer informed of the consult. TTS (Kelee Cunningham) will assess the patient.

## 2015-01-27 NOTE — ED Notes (Signed)
Pacemaker interrogated and fax given to PA

## 2015-01-27 NOTE — ED Provider Notes (Signed)
CSN: [id number]     Arrival date & time 01/27/15  1213 History   First MD Initiated Contact with Patient 01/27/15 1247     Chief Complaint  Patient presents with  . Fall  . Head Laceration     (Consider location/radiation/quality/duration/timing/severity/associated sxs/prior Treatment) HPI [name] is a 79 y.o white female who presents for fall after feeling off balance while walking at home today at 11am. She says felt like she was walking one way but her legs were going the other way. She states her pain is a 2/10 now.  Her daughter states this also happened to her yesterday while at the voting [place] but she was able to regain her balance.  Nothing makes it better or worse. She denies any fever, chest pain, shortness of breath, loss of consciousness, nausea, vomiting, abdominal pain, urinary symptoms,  Past Medical History  Diagnosis Date  . Atrial flutter     successful radiofrequency ablation 2007  . Paroxysmal atrial fibrillation   . Tachycardia-bradycardia syndrome     permament pacermaker, dual chamber Medtronic on 06/2011  . Hypertension   . Hyperlipidemia   . Pacemaker   . Tubular adenoma   . Hiatal hernia   . Diverticula of colon   . Family history of colon cancer     brother  . Severe protein-calorie malnutrition 11/17/2014   Past Surgical History  Procedure Laterality Date  . Permament pacemaker  06/2011    dual chamber , Medtronic Adapta serial # [id number]  last checked 02/05/2013  . Hiatal hernia repair  2005  . Radiofrequency ablation  06/28/2006  . Colonoscopy  08/17/2010    Dr. Gala Romney- internal hemorrhoids, o/w normal rectum, L sided diverticula, tubular adenoma  . Esophagogastroduodenoscopy  10/502011    Dr. Gala Romney- huge diaphragmatic/hiatial hernia, fundal gland polyps not manipulated o/w noral appearing gastric mucosa, patent pylorus, normal D1 & D2  . Partial hysterectomy    . Abdominal surgery    . Hip arthroplasty Left 11/16/2014    Procedure:  ARTHROPLASTY BIPOLAR HIP;  Surgeon: Mauri Pole, MD;  Location: [facility];  Service: Orthopedics;  Laterality: Left;   Family History  Problem Relation Age of Onset  . Diabetes Mother    History  Substance Use Topics  . Smoking status: Never Smoker   . Smokeless tobacco: Never Used  . Alcohol Use: No   OB History    No data available     Review of Systems  Eyes: Positive for visual disturbance.  Respiratory: Negative for cough and chest tightness.   Cardiovascular: Negative for palpitations.  Gastrointestinal: Negative for abdominal distention.  Skin: Positive for wound.  Neurological: Negative for seizures, syncope and headaches.      Allergies  Morphine and related  Home Medications   Prior to Admission medications   Medication Sig Start Date End Date Taking? Authorizing Provider  acetaminophen (TYLENOL) 325 MG tablet Take 1-2 tablets (325-650 mg total) by mouth every 4 (four) hours as needed for fever, headache or mild pain. 11/18/14  Yes Danae Orleans, PA-C  amiodarone (PACERONE) 200 MG tablet Take 200 mg by mouth daily. 10/02/14  Yes Historical Provider, MD  calcium-vitamin D (OSCAL WITH D) 500-200 MG-UNIT per tablet Take 1 tablet by mouth daily.   Yes Historical Provider, MD  dexlansoprazole (DEXILANT) 60 MG capsule Take 1 capsule (60 mg total) by mouth daily. 04/07/14  Yes Daneil Dolin, MD  metoprolol tartrate (LOPRESSOR) 25 MG tablet Take 25 mg by mouth 2 (two)  times daily. 10/02/14  Yes Historical Provider, MD  simvastatin (ZOCOR) 20 MG tablet Take 20 mg by mouth every evening.   Yes Historical Provider, MD  traMADol (ULTRAM) 50 MG tablet Take 1 tablet (50 mg total) by mouth every 6 (six) hours as needed for moderate pain. 11/19/14  Yes Tiffany L Reed, DO  warfarin (COUMADIN) 2 MG tablet Take 1 tablet by mouth daily or as directed by coumadin clinic 12/29/14  Yes Mihai Croitoru, MD  zolpidem (AMBIEN) 5 MG tablet Take one tablet by mouth at bedtime as needed for sleep  11/19/14  Yes Tiffany L Reed, DO  docusate sodium 100 MG CAPS Take 100 mg by mouth 2 (two) times daily. 11/18/14   Danae Orleans, PA-C  ferrous sulfate 325 (65 FE) MG tablet Take 1 tablet (325 mg total) by mouth 3 (three) times daily after meals. 11/18/14   Danae Orleans, PA-C  furosemide (LASIX) 20 MG tablet Take 1 tablet (20 mg total) by mouth daily. Patient not taking: Reported on 11/14/2014 06/12/14   Wandra Arthurs, MD  gabapentin (NEURONTIN) 300 MG capsule 1 cap at bedtime Patient not taking: Reported on 11/14/2014 07/09/14   Cameron Sprang, MD  HYDROcodone-acetaminophen (NORCO/VICODIN) 5-325 MG per tablet Take 1 tablet by mouth every 6 (six) hours as needed for moderate pain or severe pain. 11/25/14   Gildardo Cranker, DO  polyethylene glycol (MIRALAX / GLYCOLAX) packet Take 17 g by mouth daily as needed for mild constipation. 11/18/14   Danae Orleans, PA-C   BP 122/62 mmHg  Pulse 70  Temp(Src) 97.9 F (36.6 C) (Oral)  Resp 18  SpO2 95% Physical Exam  Constitutional: She is oriented to person, place, and time. She appears well-developed and well-nourished.  HENT:  Right Ear: External ear normal.  Left Ear: External ear normal.  Eyes: Conjunctivae and EOM are normal. Pupils are equal, round, and reactive to light.  Neck: Normal range of motion. Neck supple.  Cardiovascular: Normal rate, regular rhythm and normal heart sounds.   She has a pacemaker.   Pulmonary/Chest: Effort normal and breath sounds normal.  Abdominal: Soft. There is no tenderness.  Musculoskeletal: Normal range of motion.  She has an erythematous area on her sacrum that is non tender.  No ecchymosis, no edema. Her pelvis is stable.  She is able to move her legs without difficulty.   Neurological: She is alert and oriented to person, place, and time. She has normal strength. No sensory deficit. She displays a negative Romberg sign.  Cranial nerves II-XII in tact.   Skin: Skin is warm and dry.  1cm linear laceration on the  posterior scalp with active bleeding.  Nursing note and vitals reviewed.   ED Course  LACERATION REPAIR Date/Time: 01/27/2015 3:44 PM Performed by: Ottie Glazier Authorized by: Ottie Glazier Consent: Verbal consent obtained. Risks and benefits: risks, benefits and alternatives were discussed Consent given by: patient Patient understanding: patient states understanding of the procedure being performed Imaging studies: imaging studies available Patient identity confirmed: verbally with patient Body area: head/neck Location details: scalp Laceration length: 1 cm Foreign bodies: no foreign bodies Tendon involvement: none Nerve involvement: none Vascular damage: no Anesthesia: local infiltration Local anesthetic: lidocaine 1% without epinephrine Anesthetic total: 4 ml Patient sedated: no Preparation: Patient was prepped and draped in the usual sterile fashion. Irrigation solution: saline Irrigation method: syringe Amount of cleaning: standard Debridement: none Degree of undermining: none Skin closure: staples (2 staples) Approximation: close Approximation difficulty: simple Patient tolerance: Patient  tolerated the procedure well with no immediate complications   (including critical care time) Labs Review Labs Reviewed  CBC WITH DIFFERENTIAL/PLATELET - Abnormal; Notable for the following:    MCV 101.0 (*)    All other components within normal limits  BASIC METABOLIC PANEL - Abnormal; Notable for the following:    Glucose, Bld 122 (*)    GFR calc non Af Amer 50 (*)    GFR calc Af Amer 58 (*)    All other components within normal limits  PROTIME-INR - Abnormal; Notable for the following:    Prothrombin Time 23.7 (*)    INR 2.09 (*)    All other components within normal limits  URINALYSIS, ROUTINE W REFLEX MICROSCOPIC  I-STAT TROPOININ, ED    Imaging Review Ct Head Wo Contrast  01/27/2015   CLINICAL DATA:  Pain following fall.  EXAM: CT HEAD WITHOUT CONTRAST   TECHNIQUE: Contiguous axial images were obtained from the base of the skull through the vertex without intravenous contrast.  COMPARISON:  November 28, 2005  FINDINGS: The ventricles are normal in size and configuration. There is no mass, hemorrhage, extra-axial fluid collection, or midline shift. There is slight small vessel disease in the centra semiovale bilaterally. Elsewhere gray-white compartments appear normal. No acute appearing infarct. Basal ganglia calcification bilaterally is felt to be physiologic in this age group. Bony calvarium appears intact. The mastoid air cells are clear. There is a medial left parietal scalp hematoma. There is also a scalp hematoma just to the right of midline at the superior cerebellar level at the level of the right occipital bone medially.  IMPRESSION: Posterior scalp hematomas. Slight periventricular small vessel disease. No intracranial mass, hemorrhage, acute appearing infarct, or extra-axial fluid collection.   Electronically Signed   By: Lowella Grip III M.D.   On: 01/27/2015 14:48     EKG Interpretation   Date/Time:  Wednesday January 27 2015 13:13:44 EDT Ventricular Rate:  71 PR Interval:  230 QRS Duration: 103 QT Interval:  401 QTC Calculation: 436 R Axis:   95 Text Interpretation:  Atrial-paced rhythm Right axis deviation Nonspecific  T abnrm, anterolateral leads Baseline wander in lead(s) V1 No old tracing  to compare Confirmed by Brenton  MD, SCOTT ([id number]) on 01/27/2015 1:16:36 PM      MDM   Final diagnoses:  Fall, initial encounter  Laceration of head, initial encounter  The patient fell prior to arrival, hitting the back of her head. She did not lose consciousness, no syncope, no chest pain, no shortness of breath, no abdominal pain. The patient has a hematoma and 1cm laceration on the back of the scalp that was closed with 2 staples.  She is awake, alert, in no distress. Her neurological exam is normal.  She has had episodes similar to  this in the past. Her pacemaker has been interrogated and was found to be normal.  Her CT head shows a posterior scalp hematoma but no hemorrhage, no infarct, and no fluid collection.  Her CBC and BMP are normal. UA is normal.  Troponin is negative.  EKG does not show anything acutely.  I told her to f/u with her pcp to have her staples removed in 7- 10 days by per PCP.  She agreed with the plan.  I explained that she could still take her coumadin as prescribed and to return if she had any bleeding from the laceration, chest pain, shortness of breath, or syncope.   Ottie Glazier, PA-C 01/27/15 1606  Sherwood Gambler, MD 01/28/15 [phone]

## 2015-01-28 ENCOUNTER — Ambulatory Visit: Payer: Medicare Other | Admitting: Cardiology

## 2015-01-29 ENCOUNTER — Ambulatory Visit (INDEPENDENT_AMBULATORY_CARE_PROVIDER_SITE_OTHER): Payer: Medicare Other | Admitting: Pharmacist Clinician (PhC)/ Clinical Pharmacy Specialist

## 2015-01-29 DIAGNOSIS — I4891 Unspecified atrial fibrillation: Secondary | ICD-10-CM

## 2015-01-29 DIAGNOSIS — Z7901 Long term (current) use of anticoagulants: Secondary | ICD-10-CM

## 2015-01-29 LAB — POCT INR: INR: 2.2

## 2015-02-23 ENCOUNTER — Emergency Department (HOSPITAL_COMMUNITY): Payer: Medicare Other

## 2015-02-23 ENCOUNTER — Encounter (HOSPITAL_COMMUNITY): Payer: Self-pay | Admitting: Emergency Medicine

## 2015-02-23 ENCOUNTER — Emergency Department (HOSPITAL_COMMUNITY)
Admission: EM | Admit: 2015-02-23 | Discharge: 2015-02-24 | Disposition: A | Discharge status: Home / Self Care | Payer: Medicare Other | Attending: Emergency Medicine | Admitting: Emergency Medicine

## 2015-02-23 DIAGNOSIS — E785 Hyperlipidemia, unspecified: Secondary | ICD-10-CM | POA: Diagnosis not present

## 2015-02-23 DIAGNOSIS — I480 Paroxysmal atrial fibrillation: Secondary | ICD-10-CM | POA: Insufficient documentation

## 2015-02-23 DIAGNOSIS — N390 Urinary tract infection, site not specified: Secondary | ICD-10-CM | POA: Diagnosis not present

## 2015-02-23 DIAGNOSIS — Z79899 Other long term (current) drug therapy: Secondary | ICD-10-CM | POA: Insufficient documentation

## 2015-02-23 DIAGNOSIS — Z7901 Long term (current) use of anticoagulants: Secondary | ICD-10-CM | POA: Diagnosis not present

## 2015-02-23 DIAGNOSIS — Z86018 Personal history of other benign neoplasm: Secondary | ICD-10-CM | POA: Diagnosis not present

## 2015-02-23 DIAGNOSIS — Z8719 Personal history of other diseases of the digestive system: Secondary | ICD-10-CM | POA: Diagnosis not present

## 2015-02-23 DIAGNOSIS — R1010 Upper abdominal pain, unspecified: Secondary | ICD-10-CM | POA: Diagnosis present

## 2015-02-23 DIAGNOSIS — Z950 Presence of cardiac pacemaker: Secondary | ICD-10-CM | POA: Insufficient documentation

## 2015-02-23 DIAGNOSIS — I10 Essential (primary) hypertension: Secondary | ICD-10-CM | POA: Insufficient documentation

## 2015-02-23 DIAGNOSIS — R1084 Generalized abdominal pain: Secondary | ICD-10-CM

## 2015-02-23 LAB — CBC WITH DIFFERENTIAL/PLATELET
Basophils Absolute: 0.1 10*3/uL (ref 0.0–0.1)
Basophils Relative: 1 % (ref 0–1)
EOS ABS: 0.2 10*3/uL (ref 0.0–0.7)
Eosinophils Relative: 2 % (ref 0–5)
HEMATOCRIT: 40.3 % (ref 36.0–46.0)
Hemoglobin: 12.7 g/dL (ref 12.0–15.0)
LYMPHS ABS: 1.7 10*3/uL (ref 0.7–4.0)
LYMPHS PCT: 22 % (ref 12–46)
MCH: 31.8 pg (ref 26.0–34.0)
MCHC: 31.5 g/dL (ref 30.0–36.0)
MCV: 100.8 fL — AB (ref 78.0–100.0)
MONO ABS: 0.8 10*3/uL (ref 0.1–1.0)
Monocytes Relative: 10 % (ref 3–12)
Neutro Abs: 5.2 10*3/uL (ref 1.7–7.7)
Neutrophils Relative %: 65 % (ref 43–77)
Platelets: 264 10*3/uL (ref 150–400)
RBC: 4 MIL/uL (ref 3.87–5.11)
RDW: 13.8 % (ref 11.5–15.5)
WBC: 7.9 10*3/uL (ref 4.0–10.5)

## 2015-02-23 LAB — COMPREHENSIVE METABOLIC PANEL
ALK PHOS: 89 U/L (ref 39–117)
ALT: 20 U/L (ref 0–35)
AST: 30 U/L (ref 0–37)
Albumin: 3.8 g/dL (ref 3.5–5.2)
Anion gap: 8 (ref 5–15)
BUN: 20 mg/dL (ref 6–23)
CHLORIDE: 105 mmol/L (ref 96–112)
CO2: 25 mmol/L (ref 19–32)
CREATININE: 1.14 mg/dL — AB (ref 0.50–1.10)
Calcium: 8.6 mg/dL (ref 8.4–10.5)
GFR, EST AFRICAN AMERICAN: 53 mL/min — AB (ref >90)
GFR, EST NON AFRICAN AMERICAN: 46 mL/min — AB (ref >90)
Glucose, Bld: 142 mg/dL — ABNORMAL HIGH (ref 70–99)
POTASSIUM: 4.2 mmol/L (ref 3.5–5.1)
Sodium: 138 mmol/L (ref 135–145)
Total Bilirubin: 0.5 mg/dL (ref 0.3–1.2)
Total Protein: 6.9 g/dL (ref 6.0–8.3)

## 2015-02-23 LAB — LIPASE, BLOOD: LIPASE: 67 U/L — AB (ref 11–59)

## 2015-02-23 LAB — URINALYSIS, ROUTINE W REFLEX MICROSCOPIC
Bilirubin Urine: NEGATIVE
GLUCOSE, UA: NEGATIVE mg/dL
Ketones, ur: NEGATIVE mg/dL
Nitrite: NEGATIVE
PROTEIN: NEGATIVE mg/dL
Specific Gravity, Urine: 1.022 (ref 1.005–1.030)
UROBILINOGEN UA: 1 mg/dL (ref 0.0–1.0)
pH: 6 (ref 5.0–8.0)

## 2015-02-23 LAB — URINE MICROSCOPIC-ADD ON

## 2015-02-23 NOTE — ED Notes (Signed)
Patient c/o generalized lower abd pain x2-3 days, states today it has lasted most of the day and is worse than the days prior. Can only describe pain by saying "it hurts", last BM Sunday (02/21/15) in the AM, patient states it is normal for her not have BM daily. Denies nausea.

## 2015-02-23 NOTE — ED Notes (Signed)
Bed: WA07 Expected date:  Expected time:  Means of arrival:  Comments: Hold for triage 3

## 2015-02-24 LAB — PROTIME-INR
INR: 1.83 — ABNORMAL HIGH (ref 0.00–1.49)
Prothrombin Time: 21.3 seconds — ABNORMAL HIGH (ref 11.6–15.2)

## 2015-02-24 MED ORDER — CEPHALEXIN 500 MG PO CAPS
ORAL_CAPSULE | ORAL | Status: AC
  Administered 2015-02-24: 03:00:00
  Filled 2015-02-24: qty 1

## 2015-02-24 MED ORDER — IOHEXOL 300 MG/ML  SOLN
100.0000 mL | Freq: Once | INTRAMUSCULAR | Status: AC | PRN
  Administered 2015-02-24: 100 mL via INTRAVENOUS

## 2015-02-24 NOTE — Progress Notes (Signed)
ED CM received a transferred call from [facility] ED to speak with Lattie Haw at [facility] in [place] requesting clarification on issue of Rx for Ultram for pt  Cm confirmed with Lattie Haw pt arrived at [facility] ED 02/23/15 and d/c 02/24/15 by Dr Robyn Haber

## 2015-02-24 NOTE — ED Provider Notes (Signed)
Care assumed from Dr Alvino Chapel awaiting CT scan results.  Pt reassessed, reports pain only in lower abdomen, no upper abdominal pain.  Pain much improved.  UA with wbc, will treat for possible early UTI.  Results for orders placed or performed during the hospital encounter of 02/23/15  CBC with Differential  Result Value Ref Range   WBC 7.9 4.0 - 10.5 K/uL   RBC 4.00 3.87 - 5.11 MIL/uL   Hemoglobin 12.7 12.0 - 15.0 g/dL   HCT 40.3 36.0 - 46.0 %   MCV 100.8 (H) 78.0 - 100.0 fL   MCH 31.8 26.0 - 34.0 pg   MCHC 31.5 30.0 - 36.0 g/dL   RDW 13.8 11.5 - 15.5 %   Platelets 264 150 - 400 K/uL   Neutrophils Relative % 65 43 - 77 %   Neutro Abs 5.2 1.7 - 7.7 K/uL   Lymphocytes Relative 22 12 - 46 %   Lymphs Abs 1.7 0.7 - 4.0 K/uL   Monocytes Relative 10 3 - 12 %   Monocytes Absolute 0.8 0.1 - 1.0 K/uL   Eosinophils Relative 2 0 - 5 %   Eosinophils Absolute 0.2 0.0 - 0.7 K/uL   Basophils Relative 1 0 - 1 %   Basophils Absolute 0.1 0.0 - 0.1 K/uL  Comprehensive metabolic panel  Result Value Ref Range   Sodium 138 135 - 145 mmol/L   Potassium 4.2 3.5 - 5.1 mmol/L   Chloride 105 96 - 112 mmol/L   CO2 25 19 - 32 mmol/L   Glucose, Bld 142 (H) 70 - 99 mg/dL   BUN 20 6 - 23 mg/dL   Creatinine, Ser 1.14 (H) 0.50 - 1.10 mg/dL   Calcium 8.6 8.4 - 10.5 mg/dL   Total Protein 6.9 6.0 - 8.3 g/dL   Albumin 3.8 3.5 - 5.2 g/dL   AST 30 0 - 37 U/L   ALT 20 0 - 35 U/L   Alkaline Phosphatase 89 39 - 117 U/L   Total Bilirubin 0.5 0.3 - 1.2 mg/dL   GFR calc non Af Amer 46 (L) >90 mL/min   GFR calc Af Amer 53 (L) >90 mL/min   Anion gap 8 5 - 15  Lipase, blood  Result Value Ref Range   Lipase 67 (H) 11 - 59 U/L  Urinalysis, Routine w reflex microscopic  Result Value Ref Range   Color, Urine YELLOW YELLOW   APPearance CLEAR CLEAR   Specific Gravity, Urine 1.022 1.005 - 1.030   pH 6.0 5.0 - 8.0   Glucose, UA NEGATIVE NEGATIVE mg/dL   Hgb urine dipstick SMALL (A) NEGATIVE   Bilirubin Urine NEGATIVE  NEGATIVE   Ketones, ur NEGATIVE NEGATIVE mg/dL   Protein, ur NEGATIVE NEGATIVE mg/dL   Urobilinogen, UA 1.0 0.0 - 1.0 mg/dL   Nitrite NEGATIVE NEGATIVE   Leukocytes, UA SMALL (A) NEGATIVE  Urine microscopic-add on  Result Value Ref Range   Squamous Epithelial / LPF RARE RARE   WBC, UA 7-10 <3 WBC/hpf   RBC / HPF 3-6 <3 RBC/hpf   Bacteria, UA RARE RARE   Casts HYALINE CASTS (A) NEGATIVE  Protime-INR  Result Value Ref Range   Prothrombin Time 21.3 (H) 11.6 - 15.2 seconds   INR 1.83 (H) 0.00 - 1.49   Ct Head Wo Contrast  01/27/2015   CLINICAL DATA:  Pain following fall.  EXAM: CT HEAD WITHOUT CONTRAST  TECHNIQUE: Contiguous axial images were obtained from the base of the skull through the vertex without  intravenous contrast.  COMPARISON:  November 28, 2005  FINDINGS: The ventricles are normal in size and configuration. There is no mass, hemorrhage, extra-axial fluid collection, or midline shift. There is slight small vessel disease in the centra semiovale bilaterally. Elsewhere gray-white compartments appear normal. No acute appearing infarct. Basal ganglia calcification bilaterally is felt to be physiologic in this age group. Bony calvarium appears intact. The mastoid air cells are clear. There is a medial left parietal scalp hematoma. There is also a scalp hematoma just to the right of midline at the superior cerebellar level at the level of the right occipital bone medially.  IMPRESSION: Posterior scalp hematomas. Slight periventricular small vessel disease. No intracranial mass, hemorrhage, acute appearing infarct, or extra-axial fluid collection.   Electronically Signed   By: Lowella Grip III M.D.   On: 01/27/2015 14:48   Ct Abdomen Pelvis W Contrast  02/24/2015   CLINICAL DATA:  Lower abdominal pain, nausea for 2 days. Generalized weakness.  EXAM: CT ABDOMEN AND PELVIS WITH CONTRAST  TECHNIQUE: Multidetector CT imaging of the abdomen and pelvis was performed using the standard protocol  following bolus administration of intravenous contrast.  CONTRAST:  100 mL Omnipaque 300 IV  COMPARISON:  No prior abdominal imaging.  CT chest 05/25/2014  FINDINGS: Increase compressive atelectasis in the left lower lobe related to large hiatal hernia. Diminutive left pleural effusion. Pacemaker wires are partially included. Linear atelectasis in the right lower lobe.  Large hiatal/paraesophageal hernia, the majority of the stomach is intrathoracic. No volvulus or obstruction, oral contrast has progressed and is seen throughout the small bowel. Hernia contains portions of small bowel and splenic flexure of the colon. There are no dilated or thickened small bowel loops. Small to moderate colonic stool. There are a few distal colonic diverticula without diverticulitis. Portions of the appendix are visualized and are normal.  Gallbladder is displaced inferiorly, appears physiologically distended. No definite gallbladder wall thickening or calcified stones, however there is question of small amount of pericholecystic fluid. The liver and adrenal glands are normal. Pancreas is atrophic without ductal dilatation or peripancreatic inflammatory change. Spleen is normal in size and density. There is symmetric renal enhancement and excretion. Small left renal cysts, no enhancing renal mass.  Abdominal aorta is normal in caliber with dense atherosclerosis. No retroperitoneal adenopathy. No free air, free fluid, or intra-abdominal fluid collection.  Urinary bladder is physiologically distended. Uterus not definitively identified. No gross adnexal mass, streak artifact from left hip prosthesis partially obscures pelvic evaluation.  There are no acute or suspicious osseous abnormalities. There is degenerative disc disease and facet arthropathy in the lumbar spine.  IMPRESSION: 1. Physiologically distended gallbladder, mildly displaced into the right lower abdomen by anatomy. There is question of adjacent pericholecystic fluid.  If there is clinical concern for acute cholecystitis, right upper quadrant ultrasound is recommended. 2. Large hiatal/paraesophageal hernia. The majority of the stomach is intrathoracic, no evidence of volvulus. Hernia contains portions of the small bowel and splenic flexure of the colon. There is progressive atelectasis in the adjacent left lower lobe. 3. Chronic findings include atherosclerosis, left renal cysts, and minimal diverticulosis of the distal colon.   Electronically Signed   By: Jeb Levering M.D.   On: 02/24/2015 01:50      Linton Flemings, MD 02/24/15 [phone]

## 2015-02-24 NOTE — ED Provider Notes (Signed)
CSN: [id number]     Arrival date & time 02/23/15  2005 History   First MD Initiated Contact with Patient 02/23/15 2225     Chief Complaint  Patient presents with  . Abdominal Pain    lower     (Consider location/radiation/quality/duration/timing/severity/associated sxs/prior Treatment) Patient is a 79 y.o. female presenting with abdominal pain. The history is provided by the patient.  Abdominal Pain Associated symptoms: fatigue and nausea   Associated symptoms: no chest pain, no diarrhea, no shortness of breath and no vomiting    patient with abdominal pain. Somewhat diffuse but worse in the upper abdomen. Has had nausea and vomited once. No diarrhea. No bowel movement today but that is not unusual for her. No fevers. No dysuria. States she has had pain like this before but it's gone away. Pain is been there for the last 2 days. Decreased appetite today. No chest pain. No trouble breathing. Previous diverticula and has had a Nissen fundoplication back in November at [facility].   Past Medical History  Diagnosis Date  . Atrial flutter     successful radiofrequency ablation 2007  . Paroxysmal atrial fibrillation   . Tachycardia-bradycardia syndrome     permament pacermaker, dual chamber Medtronic on 06/2011  . Hypertension   . Hyperlipidemia   . Pacemaker   . Tubular adenoma   . Hiatal hernia   . Diverticula of colon   . Family history of colon cancer     brother  . Severe protein-calorie malnutrition 11/17/2014   Past Surgical History  Procedure Laterality Date  . Permament pacemaker  06/2011    dual chamber , Medtronic Adapta serial # [id number]  last checked 02/05/2013  . Hiatal hernia repair  2005  . Radiofrequency ablation  06/28/2006  . Colonoscopy  08/17/2010    Dr. Gala Romney- internal hemorrhoids, o/w normal rectum, L sided diverticula, tubular adenoma  . Esophagogastroduodenoscopy  10/502011    Dr. Gala Romney- huge diaphragmatic/hiatial hernia, fundal gland polyps not  manipulated o/w noral appearing gastric mucosa, patent pylorus, normal D1 & D2  . Partial hysterectomy    . Abdominal surgery    . Hip arthroplasty Left 11/16/2014    Procedure: ARTHROPLASTY BIPOLAR HIP;  Surgeon: Mauri Pole, MD;  Location: [facility];  Service: Orthopedics;  Laterality: Left;   Family History  Problem Relation Age of Onset  . Diabetes Mother    History  Substance Use Topics  . Smoking status: Never Smoker   . Smokeless tobacco: Never Used  . Alcohol Use: No   OB History    No data available     Review of Systems  Constitutional: Positive for fatigue. Negative for activity change and appetite change.  Eyes: Negative for pain.  Respiratory: Negative for chest tightness and shortness of breath.   Cardiovascular: Negative for chest pain and leg swelling.  Gastrointestinal: Positive for nausea and abdominal pain. Negative for vomiting and diarrhea.  Genitourinary: Negative for flank pain.  Musculoskeletal: Negative for back pain and neck stiffness.  Skin: Negative for rash.  Neurological: Negative for weakness, numbness and headaches.  Psychiatric/Behavioral: Negative for behavioral problems.      Allergies  Morphine and related  Home Medications   Prior to Admission medications   Medication Sig Start Date End Date Taking? Authorizing Provider  acetaminophen (TYLENOL) 325 MG tablet Take 1-2 tablets (325-650 mg total) by mouth every 4 (four) hours as needed for fever, headache or mild pain. 11/18/14  Yes Danae Orleans, PA-C  amiodarone ([place])  200 MG tablet Take 200 mg by mouth daily. 10/02/14  Yes Historical Provider, MD  dexlansoprazole (DEXILANT) 60 MG capsule Take 1 capsule (60 mg total) by mouth daily. 04/07/14  Yes Daneil Dolin, MD  escitalopram (LEXAPRO) 10 MG tablet Take 10 mg by mouth daily.   Yes Historical Provider, MD  gabapentin (NEURONTIN) 300 MG capsule 1 cap at bedtime Patient taking differently: Take 300 mg by mouth at bedtime. 1 cap at  bedtime 07/09/14  Yes Cameron Sprang, MD  metoprolol tartrate (LOPRESSOR) 25 MG tablet Take 25 mg by mouth 2 (two) times daily. 10/02/14  Yes Historical Provider, MD  simvastatin (ZOCOR) 20 MG tablet Take 20 mg by mouth every evening.   Yes Historical Provider, MD  warfarin (COUMADIN) 2 MG tablet Take 1 tablet by mouth daily or as directed by coumadin clinic Patient taking differently: Take 2 mg by mouth daily at 6 PM. Takes 1/2 tablet 1mg  on Friday. Takes 2mg  every sat-thurs. 12/29/14  Yes Mihai Croitoru, MD  zolpidem (AMBIEN) 5 MG tablet Take one tablet by mouth at bedtime as needed for sleep 11/19/14  Yes Tiffany L Reed, DO  docusate sodium 100 MG CAPS Take 100 mg by mouth 2 (two) times daily. Patient not taking: Reported on 02/23/2015 11/18/14   Danae Orleans, PA-C  ferrous sulfate 325 (65 FE) MG tablet Take 1 tablet (325 mg total) by mouth 3 (three) times daily after meals. Patient not taking: Reported on 02/23/2015 11/18/14   Danae Orleans, PA-C  furosemide (LASIX) 20 MG tablet Take 1 tablet (20 mg total) by mouth daily. Patient not taking: Reported on 11/14/2014 06/12/14   Wandra Arthurs, MD  HYDROcodone-acetaminophen (NORCO/VICODIN) 5-325 MG per tablet Take 1 tablet by mouth every 6 (six) hours as needed for moderate pain or severe pain. Patient not taking: Reported on 02/23/2015 11/25/14   Gildardo Cranker, DO  polyethylene glycol [facility] / GLYCOLAX) packet Take 17 g by mouth daily as needed for mild constipation. Patient not taking: Reported on 02/23/2015 11/18/14   Danae Orleans, PA-C  traMADol (ULTRAM) 50 MG tablet Take 1 tablet (50 mg total) by mouth every 6 (six) hours as needed for moderate pain. Patient not taking: Reported on 02/23/2015 11/19/14   Tiffany L Reed, DO   BP 134/66 mmHg  Pulse 73  Temp(Src) 97.8 F (36.6 C) (Oral)  Resp 16  Ht 5\' 4"  (1.626 m)  Wt 116 lb (52.617 kg)  BMI 19.90 kg/m2  SpO2 95% Physical Exam  Constitutional: She is oriented to person, place, and time. She appears  well-developed and well-nourished.  HENT:  Head: Normocephalic and atraumatic.  Eyes: EOM are normal. Pupils are equal, round, and reactive to light.  Neck: Normal range of motion. Neck supple.  Cardiovascular: Normal rate, regular rhythm and normal heart sounds.   No murmur heard. Pulmonary/Chest: Effort normal and breath sounds normal. No respiratory distress. She has no wheezes. She has no rales.  Abdominal: Soft. She exhibits no distension. There is tenderness. There is no rebound and no guarding.   upper abdominal tenderness in her epigastric to left upper quadrant. No hernias palpated.  Musculoskeletal: Normal range of motion.  Neurological: She is alert and oriented to person, place, and time. No cranial nerve deficit.  Skin: Skin is warm and dry.  Psychiatric: She has a normal mood and affect. Her speech is normal.  Nursing note and vitals reviewed.   ED Course  Procedures (including critical care time) Labs Review Labs Reviewed  CBC WITH DIFFERENTIAL/PLATELET - Abnormal; Notable for the following:    MCV 100.8 (*)    All other components within normal limits  COMPREHENSIVE METABOLIC PANEL - Abnormal; Notable for the following:    Glucose, Bld 142 (*)    Creatinine, Ser 1.14 (*)    GFR calc non Af Amer 46 (*)    GFR calc Af Amer 53 (*)    All other components within normal limits  LIPASE, BLOOD - Abnormal; Notable for the following:    Lipase 67 (*)    All other components within normal limits  URINALYSIS, ROUTINE W REFLEX MICROSCOPIC - Abnormal; Notable for the following:    Hgb urine dipstick SMALL (*)    Leukocytes, UA SMALL (*)    All other components within normal limits  URINE MICROSCOPIC-ADD ON - Abnormal; Notable for the following:    Casts HYALINE CASTS (*)    All other components within normal limits  PROTIME-INR    Imaging Review No results found.   EKG Interpretation None      MDM   Final diagnoses:  None    Patient with abdominal pain.  Worse in epigastric area. Previous Nissen fundoplication. Lipase is minimally elevated and creatinine slightly increased. Urine shows some white cells without bacteria. Will get CT scan.    Davonna Belling, MD 02/24/15 [phone]

## 2015-02-25 LAB — URINE CULTURE
COLONY COUNT: NO GROWTH
CULTURE: NO GROWTH

## 2015-02-26 ENCOUNTER — Ambulatory Visit (INDEPENDENT_AMBULATORY_CARE_PROVIDER_SITE_OTHER): Payer: Medicare Other | Admitting: Pharmacist Clinician (PhC)/ Clinical Pharmacy Specialist

## 2015-02-26 DIAGNOSIS — I4891 Unspecified atrial fibrillation: Secondary | ICD-10-CM | POA: Diagnosis not present

## 2015-02-26 DIAGNOSIS — Z7901 Long term (current) use of anticoagulants: Secondary | ICD-10-CM

## 2015-02-26 LAB — POCT INR: INR: 1.7

## 2015-03-12 ENCOUNTER — Other Ambulatory Visit (HOSPITAL_COMMUNITY): Payer: Self-pay | Admitting: Family Medicine

## 2015-03-12 DIAGNOSIS — R935 Abnormal findings on diagnostic imaging of other abdominal regions, including retroperitoneum: Secondary | ICD-10-CM

## 2015-03-14 DEATH — deceased

## 2015-03-17 ENCOUNTER — Other Ambulatory Visit (HOSPITAL_COMMUNITY): Payer: Medicare Other

## 2015-03-19 ENCOUNTER — Ambulatory Visit (HOSPITAL_COMMUNITY)
Admission: RE | Admit: 2015-03-19 | Discharge: 2015-03-19 | Disposition: A | Discharge status: Home / Self Care | Payer: Medicare Other | Source: Ambulatory Visit | Attending: Family Medicine | Admitting: Family Medicine

## 2015-03-19 ENCOUNTER — Ambulatory Visit (INDEPENDENT_AMBULATORY_CARE_PROVIDER_SITE_OTHER): Payer: Medicare Other | Admitting: Pharmacist Clinician (PhC)/ Clinical Pharmacy Specialist

## 2015-03-19 DIAGNOSIS — R932 Abnormal findings on diagnostic imaging of liver and biliary tract: Secondary | ICD-10-CM | POA: Insufficient documentation

## 2015-03-19 DIAGNOSIS — R935 Abnormal findings on diagnostic imaging of other abdominal regions, including retroperitoneum: Secondary | ICD-10-CM

## 2015-03-19 DIAGNOSIS — Z7901 Long term (current) use of anticoagulants: Secondary | ICD-10-CM | POA: Diagnosis not present

## 2015-03-19 DIAGNOSIS — I4891 Unspecified atrial fibrillation: Secondary | ICD-10-CM | POA: Diagnosis not present

## 2015-03-19 LAB — POCT INR: INR: 1.9

## 2015-03-22 ENCOUNTER — Other Ambulatory Visit: Payer: Self-pay | Admitting: Cardiovascular Disease

## 2015-04-13 ENCOUNTER — Encounter: Payer: Self-pay | Admitting: Cardiovascular Disease

## 2015-04-13 ENCOUNTER — Ambulatory Visit (INDEPENDENT_AMBULATORY_CARE_PROVIDER_SITE_OTHER): Payer: Medicare Other | Admitting: *Deleted

## 2015-04-13 ENCOUNTER — Telehealth: Payer: Self-pay

## 2015-04-13 ENCOUNTER — Ambulatory Visit (INDEPENDENT_AMBULATORY_CARE_PROVIDER_SITE_OTHER): Payer: Medicare Other | Admitting: Cardiovascular Disease

## 2015-04-13 VITALS — BP 154/84 | HR 74 | Resp 16 | Ht 64.0 in | Wt 118.3 lb

## 2015-04-13 DIAGNOSIS — I4891 Unspecified atrial fibrillation: Secondary | ICD-10-CM

## 2015-04-13 DIAGNOSIS — Z79899 Other long term (current) drug therapy: Secondary | ICD-10-CM | POA: Diagnosis not present

## 2015-04-13 DIAGNOSIS — I480 Paroxysmal atrial fibrillation: Secondary | ICD-10-CM | POA: Diagnosis not present

## 2015-04-13 DIAGNOSIS — Z950 Presence of cardiac pacemaker: Secondary | ICD-10-CM

## 2015-04-13 DIAGNOSIS — I495 Sick sinus syndrome: Secondary | ICD-10-CM

## 2015-04-13 DIAGNOSIS — I10 Essential (primary) hypertension: Secondary | ICD-10-CM

## 2015-04-13 DIAGNOSIS — E785 Hyperlipidemia, unspecified: Secondary | ICD-10-CM

## 2015-04-13 DIAGNOSIS — Z7901 Long term (current) use of anticoagulants: Secondary | ICD-10-CM

## 2015-04-13 LAB — CUP PACEART INCLINIC DEVICE CHECK
Battery Impedance: 208 Ohm
Battery Remaining Longevity: 129 mo
Brady Statistic AP VP Percent: 0 %
Brady Statistic AP VS Percent: 99 %
Brady Statistic AS VS Percent: 1 %
Lead Channel Impedance Value: 500 Ohm
Lead Channel Impedance Value: 534 Ohm
Lead Channel Pacing Threshold Amplitude: 0.75 V
Lead Channel Pacing Threshold Pulse Width: 0.4 ms
Lead Channel Sensing Intrinsic Amplitude: 11.2 mV
Lead Channel Setting Pacing Amplitude: 1.5 V
Lead Channel Setting Pacing Amplitude: 2 V
Lead Channel Setting Sensing Sensitivity: 5.6 mV
MDC IDC MSMT BATTERY VOLTAGE: 2.79 V
MDC IDC MSMT LEADCHNL RA PACING THRESHOLD AMPLITUDE: 0.5 V
MDC IDC MSMT LEADCHNL RA PACING THRESHOLD PULSEWIDTH: 0.4 ms
MDC IDC SESS DTM: 20160531124307
MDC IDC SET LEADCHNL RV PACING PULSEWIDTH: 0.4 ms
MDC IDC STAT BRADY AS VP PERCENT: 0 %

## 2015-04-13 LAB — POCT INR: INR: 1.6

## 2015-04-13 LAB — TSH: TSH: 1.542 u[IU]/mL (ref 0.350–4.500)

## 2015-04-13 NOTE — Progress Notes (Signed)
Patient ID: Anne Anthony, female   DOB: 1936/01/10, 79 y.o.   MRN: [id number]     Cardiology Office Note   Date:  04/13/2015   ID:  Anne Anthony, DOB 09/22/1936, MRN [id number]  PCP:  Purvis Kilts, MD  Cardiologist:   Sanda Klein, MD   Chief Complaint  Patient presents with  . Annual Exam    sweats really bad on occasion, no other concerns      History of Present Illness: Anne Anthony is a 79 y.o. female who presents for paroxysmal atrial fibrillation, pacemaker follow up.  She was initially diagnosed with atrial flutter and had successful radiofrequency caval tricuspid isthmus ablation, but several years later returned with paroxysmal atrial fibrillation and required a dual-chamber permanent pacemaker for tachycardia-bradycardia syndrome (Medtronic, implanted August 2012). She had increased problems with atrial fibrillation around the time of paraesophageal hernia repair November 2015, but her device shows no atrial fibrillation in the last 6 months. She is on amiodarone still, ever since that surgery. She had a left hip fracture that required surgery in January 2016.  Interrogation of her pacemaker today shows normal device function. She has approximately 99.7% atrial pacing and less than 0.1% ventricular pacing. There are no detectable P waves today. Heart rate histogram is excellent suggesting that there is good treatment for chronotropic incompetence. She has had no atrial fibrillation. Generator (Adapta) expected to last another 10 years.   She continues to have episodes of drenching sweats, unexplained (these are not associated with arhythmia and precede amiodarone therapy). Thyroid studies not checked since amiodarone was started.   Past Medical History  Diagnosis Date  . Atrial flutter     successful radiofrequency ablation 2007  . Paroxysmal atrial fibrillation   . Tachycardia-bradycardia syndrome     permament pacermaker, dual chamber Medtronic on 06/2011    . Hypertension   . Hyperlipidemia   . Pacemaker   . Tubular adenoma   . Hiatal hernia   . Diverticula of colon   . Family history of colon cancer     brother  . Severe protein-calorie malnutrition 11/17/2014    Past Surgical History  Procedure Laterality Date  . Permament pacemaker  06/2011    dual chamber , Medtronic Adapta serial # [id number]  last checked 02/05/2013  . Hiatal hernia repair  2005  . Radiofrequency ablation  06/28/2006  . Colonoscopy  08/17/2010    Dr. Gala Romney- internal hemorrhoids, o/w normal rectum, L sided diverticula, tubular adenoma  . Esophagogastroduodenoscopy  10/502011    Dr. Gala Romney- huge diaphragmatic/hiatial hernia, fundal gland polyps not manipulated o/w noral appearing gastric mucosa, patent pylorus, normal D1 & D2  . Partial hysterectomy    . Abdominal surgery    . Hip arthroplasty Left 11/16/2014    Procedure: ARTHROPLASTY BIPOLAR HIP;  Surgeon: Mauri Pole, MD;  Location: [facility];  Service: Orthopedics;  Laterality: Left;     Current Outpatient Prescriptions  Medication Sig Dispense Refill  . acetaminophen (TYLENOL) 325 MG tablet Take 1-2 tablets (325-650 mg total) by mouth every 4 (four) hours as needed for fever, headache or mild pain.    Marland Kitchen amiodarone (PACERONE) 200 MG tablet Take 200 mg by mouth daily.    Marland Kitchen dexlansoprazole (DEXILANT) 60 MG capsule Take 1 capsule (60 mg total) by mouth daily. 30 capsule 11  . escitalopram (LEXAPRO) 10 MG tablet Take 10 mg by mouth daily.    . ferrous sulfate 325 (65 FE) MG tablet Take 1 tablet (325  mg total) by mouth 3 (three) times daily after meals.  3  . gabapentin (NEURONTIN) 300 MG capsule 1 cap at bedtime (Patient taking differently: Take 300 mg by mouth at bedtime. 1 cap at bedtime) 30 capsule 6  . meclizine (ANTIVERT) 25 MG tablet Take 25 mg by mouth 2 (two) times daily.    . metoprolol tartrate (LOPRESSOR) 25 MG tablet Take 25 mg by mouth 2 (two) times daily.    . simvastatin (ZOCOR) 20 MG tablet Take 20  mg by mouth every evening.    . warfarin (COUMADIN) 2 MG tablet Take 1 tablet by mouth daily or as directed by coumadin clinic 90 tablet 1  . zolpidem (AMBIEN) 5 MG tablet Take one tablet by mouth at bedtime as needed for sleep 30 tablet 5   No current facility-administered medications for this visit.    Allergies:   Morphine and related    Social History:  The patient  reports that she has never smoked. She has never used smokeless tobacco. She reports that she does not drink alcohol or use illicit drugs.   Family History:  The patient's family history includes Diabetes in her mother.    ROS:  Please see the history of present illness.    Otherwise, review of systems positive for none.   All other systems are reviewed and negative.    PHYSICAL EXAM: VS:  BP 154/84 mmHg  Pulse 74  Ht 5\' 4"  (1.626 m)  Wt 53.661 kg (118 lb 4.8 oz)  BMI 20.30 kg/m2 , BMI Body mass index is 20.3 kg/(m^2).  General: Alert, oriented x3, no distress Head: no evidence of trauma, PERRL, EOMI, no exophtalmos or lid lag, no myxedema, no xanthelasma; normal ears, nose and oropharynx Neck: normal jugular venous pulsations and no hepatojugular reflux; brisk carotid pulses without delay and no carotid bruits Chest: clear to auscultation, no signs of consolidation by percussion or palpation, normal fremitus, symmetrical and full respiratory excursions Cardiovascular: normal position and quality of the apical impulse, regular rhythm, normal first and second heart sounds, no murmurs, rubs or gallops Abdomen: no tenderness or distention, no masses by palpation, no abnormal pulsatility or arterial bruits, normal bowel sounds, no hepatosplenomegaly Extremities: no clubbing, cyanosis or edema; 2+ radial, ulnar and brachial pulses bilaterally; 2+ right femoral, posterior tibial and dorsalis pedis pulses; 2+ left femoral, posterior tibial and dorsalis pedis pulses; no subclavian or femoral bruits Neurological: grossly  nonfocal Psych: euthymic mood, full affect   EKG:  EKG is not ordered today   Recent Labs: 06/12/2014: Pro B Natriuretic peptide (BNP) 857.5* 02/23/2015: ALT 20; BUN 20; Creatinine 1.14*; Hemoglobin 12.7; Platelets 264; Potassium 4.2; Sodium 138    Lipid Panel No results found for: CHOL, TRIG, HDL, CHOLHDL, VLDL, LDLCALC, LDLDIRECT    Wt Readings from Last 3 Encounters:  04/13/15 53.661 kg (118 lb 4.8 oz)  02/23/15 52.617 kg (116 lb)  11/14/14 48.762 kg (107 lb 8 oz)     ASSESSMENT AND PLAN:  1. atrial fibrillation, paroxysmal - burden is very low. Reduce amiodarone to 100 mg daily If no arrhythmia at next device check will plan to stop amiodarone and continue warfarin. Check TSH. Potential simvastatin/amiodarone interaction noted, but no side effects so far.  2. Sinus node dysfunction, normal dual chamber pacemaker function   3. Hyperlipidemia - continue low dose simvastatin for now; will change to another agent if amiodarone becomes chronic therapy.  4. HTN - well compensated   Current medicines are reviewed at  length with the patient today.  The patient does not have concerns regarding medicines.  The following changes have been made:  no change  Labs/ tests ordered today include:   Orders Placed This Encounter  Procedures  . TSH  . Implantable device check    Patient Instructions  Dr Sallyanne Kuster recommends that you return for lab work TODAY.  Dr Sallyanne Kuster recommends that you schedule a follow-up appointment in 6 months with pacer check. You will receive a reminder letter in the mail two months in advance. If you don't receive a letter, please call our office to schedule the follow-up appointment.    Mikael Spray, MD  04/13/2015 1:44 PM    Sanda Klein, MD, [facility] [phone] office [phone] pager

## 2015-04-13 NOTE — Telephone Encounter (Signed)
-----   Message from Sanda Klein, MD sent at 04/13/2015  2:08 PM EDT ----- Please ask her to cut back her amiodarone to 100 mg daily (1/2 of 200 mg tab daily). Will probably stop completely, if there is no AF at next pacemaker check.

## 2015-04-13 NOTE — Telephone Encounter (Signed)
Called patient to advise of medication changes. Patient understood and agreed with plan.

## 2015-04-13 NOTE — Patient Instructions (Signed)
Dr Sallyanne Kuster recommends that you return for lab work TODAY.  Dr Sallyanne Kuster recommends that you schedule a follow-up appointment in 6 months with pacer check. You will receive a reminder letter in the mail two months in advance. If you don't receive a letter, please call our office to schedule the follow-up appointment.

## 2015-04-26 ENCOUNTER — Other Ambulatory Visit: Payer: Self-pay

## 2015-04-26 ENCOUNTER — Ambulatory Visit (INDEPENDENT_AMBULATORY_CARE_PROVIDER_SITE_OTHER): Payer: Medicare Other | Admitting: Pharmacist Clinician (PhC)/ Clinical Pharmacy Specialist

## 2015-04-26 DIAGNOSIS — Z7901 Long term (current) use of anticoagulants: Secondary | ICD-10-CM

## 2015-04-26 DIAGNOSIS — I480 Paroxysmal atrial fibrillation: Secondary | ICD-10-CM | POA: Diagnosis not present

## 2015-04-26 LAB — POCT INR: INR: 1.9

## 2015-04-27 ENCOUNTER — Encounter: Payer: Self-pay | Admitting: Cardiovascular Disease

## 2015-04-27 MED ORDER — DEXLANSOPRAZOLE 60 MG PO CPDR: 60.0000 mg | DELAYED_RELEASE_CAPSULE | Freq: Every day | ORAL | Status: DC

## 2015-04-28 ENCOUNTER — Other Ambulatory Visit: Payer: Self-pay

## 2015-05-07 ENCOUNTER — Ambulatory Visit (INDEPENDENT_AMBULATORY_CARE_PROVIDER_SITE_OTHER): Payer: Medicare Other | Admitting: Pharmacist

## 2015-05-07 DIAGNOSIS — I4891 Unspecified atrial fibrillation: Secondary | ICD-10-CM

## 2015-05-07 DIAGNOSIS — Z7901 Long term (current) use of anticoagulants: Secondary | ICD-10-CM | POA: Diagnosis not present

## 2015-05-07 LAB — POCT INR: INR: 3

## 2015-05-21 ENCOUNTER — Other Ambulatory Visit (HOSPITAL_COMMUNITY): Payer: Self-pay | Admitting: Family Medicine

## 2015-05-21 DIAGNOSIS — Z1231 Encounter for screening mammogram for malignant neoplasm of breast: Secondary | ICD-10-CM

## 2015-05-26 ENCOUNTER — Other Ambulatory Visit: Payer: Self-pay

## 2015-05-26 MED ORDER — DEXLANSOPRAZOLE 60 MG PO CPDR: 60.0000 mg | DELAYED_RELEASE_CAPSULE | Freq: Every day | ORAL | Status: DC

## 2015-05-27 ENCOUNTER — Ambulatory Visit (HOSPITAL_COMMUNITY)
Admission: RE | Admit: 2015-05-27 | Discharge: 2015-05-27 | Disposition: A | Discharge status: Home / Self Care | Payer: Medicare Other | Source: Ambulatory Visit | Attending: Family Medicine | Admitting: Family Medicine

## 2015-05-27 DIAGNOSIS — Z1231 Encounter for screening mammogram for malignant neoplasm of breast: Secondary | ICD-10-CM | POA: Diagnosis present

## 2015-05-28 ENCOUNTER — Ambulatory Visit (INDEPENDENT_AMBULATORY_CARE_PROVIDER_SITE_OTHER): Payer: Medicare Other | Admitting: Pharmacist Clinician (PhC)/ Clinical Pharmacy Specialist

## 2015-05-28 DIAGNOSIS — Z7901 Long term (current) use of anticoagulants: Secondary | ICD-10-CM | POA: Diagnosis not present

## 2015-05-28 DIAGNOSIS — I4891 Unspecified atrial fibrillation: Secondary | ICD-10-CM | POA: Diagnosis not present

## 2015-05-28 LAB — POCT INR: INR: 2.6

## 2015-05-29 ENCOUNTER — Other Ambulatory Visit: Payer: Self-pay | Admitting: Neurology

## 2015-06-25 ENCOUNTER — Ambulatory Visit (INDEPENDENT_AMBULATORY_CARE_PROVIDER_SITE_OTHER): Payer: Medicare Other | Admitting: Pharmacist Clinician (PhC)/ Clinical Pharmacy Specialist

## 2015-06-25 DIAGNOSIS — Z7901 Long term (current) use of anticoagulants: Secondary | ICD-10-CM | POA: Diagnosis not present

## 2015-06-25 DIAGNOSIS — I4891 Unspecified atrial fibrillation: Secondary | ICD-10-CM | POA: Diagnosis not present

## 2015-06-25 LAB — POCT INR: INR: 6.4

## 2015-06-28 ENCOUNTER — Telehealth: Payer: Self-pay | Admitting: Family Medicine

## 2015-06-28 DIAGNOSIS — M5442 Lumbago with sciatica, left side: Secondary | ICD-10-CM

## 2015-06-28 MED ORDER — GABAPENTIN 300 MG PO CAPS: ORAL_CAPSULE | ORAL | Status: DC

## 2015-06-28 NOTE — Telephone Encounter (Signed)
Called and spoke with patient's daughter Anne Ng about refill request we received from patient's pharmacy for Gabapentin. I explained that Dr. Delice Lesch hasn't seen the patient in a year, and patient cancelled her last f/u visit where she stated that she didn't need to be seen she was doing ok. I did tell her daughter that per Dr. Delice Lesch we can fill Rx 1 more time and if she wants to continue with medication after that then she have her pcp fill Rx.

## 2015-06-30 ENCOUNTER — Other Ambulatory Visit: Payer: Self-pay | Admitting: Cardiovascular Disease

## 2015-07-05 ENCOUNTER — Ambulatory Visit (INDEPENDENT_AMBULATORY_CARE_PROVIDER_SITE_OTHER): Payer: Medicare Other | Admitting: Pharmacist Clinician (PhC)/ Clinical Pharmacy Specialist

## 2015-07-05 DIAGNOSIS — Z7901 Long term (current) use of anticoagulants: Secondary | ICD-10-CM | POA: Diagnosis not present

## 2015-07-05 DIAGNOSIS — I4891 Unspecified atrial fibrillation: Secondary | ICD-10-CM

## 2015-07-05 LAB — POCT INR: INR: 2.2

## 2015-07-26 ENCOUNTER — Ambulatory Visit (INDEPENDENT_AMBULATORY_CARE_PROVIDER_SITE_OTHER): Payer: Medicare Other | Admitting: Pharmacist Clinician (PhC)/ Clinical Pharmacy Specialist

## 2015-07-26 DIAGNOSIS — I4891 Unspecified atrial fibrillation: Secondary | ICD-10-CM

## 2015-07-26 DIAGNOSIS — Z7901 Long term (current) use of anticoagulants: Secondary | ICD-10-CM

## 2015-07-26 LAB — POCT INR: INR: 2.9

## 2015-08-02 ENCOUNTER — Encounter: Payer: Self-pay | Admitting: Internal Medicine

## 2015-08-23 ENCOUNTER — Ambulatory Visit (INDEPENDENT_AMBULATORY_CARE_PROVIDER_SITE_OTHER): Payer: Medicare Other | Admitting: Pharmacist Clinician (PhC)/ Clinical Pharmacy Specialist

## 2015-08-23 DIAGNOSIS — Z7901 Long term (current) use of anticoagulants: Secondary | ICD-10-CM | POA: Diagnosis not present

## 2015-08-23 DIAGNOSIS — I4891 Unspecified atrial fibrillation: Secondary | ICD-10-CM

## 2015-08-23 LAB — POCT INR: INR: 3

## 2015-08-25 ENCOUNTER — Other Ambulatory Visit (HOSPITAL_COMMUNITY): Payer: Self-pay | Admitting: Nephrology

## 2015-08-25 DIAGNOSIS — N19 Unspecified kidney failure: Secondary | ICD-10-CM

## 2015-08-27 ENCOUNTER — Ambulatory Visit (HOSPITAL_COMMUNITY)
Admission: RE | Admit: 2015-08-27 | Discharge: 2015-08-27 | Disposition: A | Discharge status: Home / Self Care | Payer: Medicare Other | Source: Ambulatory Visit | Attending: Nephrology | Admitting: Nephrology

## 2015-08-27 DIAGNOSIS — N19 Unspecified kidney failure: Secondary | ICD-10-CM | POA: Insufficient documentation

## 2015-08-27 DIAGNOSIS — N281 Cyst of kidney, acquired: Secondary | ICD-10-CM | POA: Diagnosis not present

## 2015-09-25 ENCOUNTER — Other Ambulatory Visit: Payer: Self-pay | Admitting: Neurology

## 2015-09-27 ENCOUNTER — Other Ambulatory Visit: Payer: Self-pay | Admitting: Neurology

## 2015-09-27 ENCOUNTER — Other Ambulatory Visit: Payer: Self-pay | Admitting: Cardiovascular Disease

## 2015-10-03 ENCOUNTER — Other Ambulatory Visit: Payer: Self-pay | Admitting: Neurology

## 2015-10-04 ENCOUNTER — Ambulatory Visit (INDEPENDENT_AMBULATORY_CARE_PROVIDER_SITE_OTHER): Payer: Medicare Other | Admitting: Pharmacist Clinician (PhC)/ Clinical Pharmacy Specialist

## 2015-10-04 DIAGNOSIS — Z7901 Long term (current) use of anticoagulants: Secondary | ICD-10-CM | POA: Diagnosis not present

## 2015-10-04 DIAGNOSIS — I4891 Unspecified atrial fibrillation: Secondary | ICD-10-CM | POA: Diagnosis not present

## 2015-10-04 LAB — POCT INR: INR: 3.6

## 2015-10-14 ENCOUNTER — Other Ambulatory Visit (HOSPITAL_COMMUNITY): Payer: Self-pay | Admitting: Family Medicine

## 2015-10-14 ENCOUNTER — Ambulatory Visit (HOSPITAL_COMMUNITY)
Admission: RE | Admit: 2015-10-14 | Discharge: 2015-10-14 | Disposition: A | Discharge status: Home / Self Care | Payer: Medicare Other | Source: Ambulatory Visit | Attending: Family Medicine | Admitting: Family Medicine

## 2015-10-14 DIAGNOSIS — I482 Chronic atrial fibrillation, unspecified: Secondary | ICD-10-CM

## 2015-10-14 DIAGNOSIS — R05 Cough: Secondary | ICD-10-CM | POA: Insufficient documentation

## 2015-10-14 DIAGNOSIS — R079 Chest pain, unspecified: Secondary | ICD-10-CM | POA: Diagnosis not present

## 2015-10-14 DIAGNOSIS — R0602 Shortness of breath: Secondary | ICD-10-CM | POA: Insufficient documentation

## 2015-10-14 DIAGNOSIS — K449 Diaphragmatic hernia without obstruction or gangrene: Secondary | ICD-10-CM | POA: Diagnosis not present

## 2015-10-15 ENCOUNTER — Encounter: Payer: Self-pay | Admitting: Cardiovascular Disease

## 2015-10-15 ENCOUNTER — Ambulatory Visit (INDEPENDENT_AMBULATORY_CARE_PROVIDER_SITE_OTHER): Payer: Medicare Other | Admitting: Pharmacist Clinician (PhC)/ Clinical Pharmacy Specialist

## 2015-10-15 ENCOUNTER — Ambulatory Visit (INDEPENDENT_AMBULATORY_CARE_PROVIDER_SITE_OTHER): Payer: Medicare Other | Admitting: Cardiovascular Disease

## 2015-10-15 VITALS — BP 159/83 | HR 76 | Resp 20 | Ht 64.0 in | Wt 124.5 lb

## 2015-10-15 DIAGNOSIS — I4891 Unspecified atrial fibrillation: Secondary | ICD-10-CM

## 2015-10-15 DIAGNOSIS — I495 Sick sinus syndrome: Secondary | ICD-10-CM

## 2015-10-15 DIAGNOSIS — Z7901 Long term (current) use of anticoagulants: Secondary | ICD-10-CM

## 2015-10-15 DIAGNOSIS — I10 Essential (primary) hypertension: Secondary | ICD-10-CM

## 2015-10-15 DIAGNOSIS — E785 Hyperlipidemia, unspecified: Secondary | ICD-10-CM

## 2015-10-15 DIAGNOSIS — I480 Paroxysmal atrial fibrillation: Secondary | ICD-10-CM

## 2015-10-15 DIAGNOSIS — E43 Unspecified severe protein-calorie malnutrition: Secondary | ICD-10-CM

## 2015-10-15 DIAGNOSIS — Z950 Presence of cardiac pacemaker: Secondary | ICD-10-CM

## 2015-10-15 LAB — POCT INR: INR: 3.5

## 2015-10-15 NOTE — Patient Instructions (Signed)
Your physician has recommended you make the following change in your medication:   STOP AMIODARONE  Dr. Sallyanne Kuster recommends that you schedule a follow-up appointment in: [place] ([place]

## 2015-10-15 NOTE — Progress Notes (Signed)
Patient ID: Anne Anthony, female   DOB: [birth year], 79 y.o.   MRN: [id number]     Cardiology Office Note   Date:  10/15/2015   ID:  Anne Anthony, DOB 08/19/1936, MRN [id number]  PCP:  Purvis Kilts, MD  Cardiologist:   Sanda Klein, MD   Chief Complaint  Patient presents with  . Follow-up    6 months:  Intermittent episodes of SOB not associated with activity.  No complaints of chest pain or edema.  Infrequent lightheadedness. Has not taken AM meds yet.      History of Present Illness: Anne Anthony is a 79 y.o. female who presents for paroxysmal atrial fibrillation, pacemaker follow up.  She was initially diagnosed with atrial flutter and had successful radiofrequency caval tricuspid isthmus ablation, but several years later returned with paroxysmal atrial fibrillation and required a dual-chamber permanent pacemaker for tachycardia-bradycardia syndrome (Medtronic, implanted August 2012). She had increased problems with atrial fibrillation around the time of paraesophageal hernia repair November 2015, but her device shows no atrial fibrillation in the last 12 months. She is on amiodarone still, ever since that surgery. She had a left hip fracture that required surgery in January 2016.  Interrogation of her pacemaker today shows normal device function. She has approximately 99.6% atrial pacing and less than 0.1% ventricular pacing. There are no detectable P waves today. Heart rate histogram is rather flat, but she is sedentary. She has had no atrial fibrillation. Generator (Adapta) expected to last another 11 years.   She continues to have episodes of nausea, dry heaves and dyspnea at rest, unexplained (these are not associated with arhythmia and precede amiodarone therapy). Thyroid studies normal in May.  Sometimes the symptoms occur when she lies down , but this is inconsistent. She felt particularly ill after her Thanksgiving dinner.   Her chest x-ray shows a large hiatal  hernia and no evidence of heart failure. The appearance of the hiatal hernia on the x-ray performed December 1 is unchanged from November 19, 2014.    Past Medical History  Diagnosis Date  . Atrial flutter ([place])     successful radiofrequency ablation 2007  . Paroxysmal atrial fibrillation (HCC)   . Tachycardia-bradycardia syndrome ([place])     permament pacermaker, dual chamber Medtronic on 06/2011  . Hypertension   . Hyperlipidemia   . Pacemaker   . Tubular adenoma   . Hiatal hernia   . Diverticula of colon   . Family history of colon cancer     brother  . Severe protein-calorie malnutrition ([place]) 11/17/2014    Past Surgical History  Procedure Laterality Date  . Permament pacemaker  06/2011    dual chamber , Medtronic Adapta serial # [id number]  last checked 02/05/2013  . Hiatal hernia repair  2005  . Radiofrequency ablation  06/28/2006  . Colonoscopy  08/17/2010    Dr. Gala Romney- internal hemorrhoids, o/w normal rectum, L sided diverticula, tubular adenoma  . Esophagogastroduodenoscopy  10/502011    Dr. Gala Romney- huge diaphragmatic/hiatial hernia, fundal gland polyps not manipulated o/w noral appearing gastric mucosa, patent pylorus, normal D1 & D2  . Partial hysterectomy    . Abdominal surgery    . Hip arthroplasty Left 11/16/2014    Procedure: ARTHROPLASTY BIPOLAR HIP;  Surgeon: Mauri Pole, MD;  Location: [facility];  Service: Orthopedics;  Laterality: Left;     Current Outpatient Prescriptions  Medication Sig Dispense Refill  . acetaminophen (TYLENOL) 325 MG tablet Take 1-2 tablets (325-650 mg total) by  mouth every 4 (four) hours as needed for fever, headache or mild pain.    Marland Kitchen dexlansoprazole (DEXILANT) 60 MG capsule Take 1 capsule (60 mg total) by mouth daily. 30 capsule 11  . escitalopram (LEXAPRO) 10 MG tablet Take 10 mg by mouth daily.    Marland Kitchen gabapentin (NEURONTIN) 300 MG capsule 1 cap at bedtime 90 capsule 0  . meclizine (ANTIVERT) 25 MG tablet Take 25 mg by mouth 2 (two) times  daily.    . metoprolol tartrate (LOPRESSOR) 25 MG tablet Take 25 mg by mouth 2 (two) times daily.    . simvastatin (ZOCOR) 20 MG tablet Take 20 mg by mouth every evening.    . warfarin (COUMADIN) 2 MG tablet Take 1 to 1.5 tablets by mouth daily as directed by coumadin clinic 120 tablet 1  . warfarin (COUMADIN) 2 MG tablet Take 1 to 1.5 tablets by mouth daily as directed by coumadin clinic (Patient taking differently: 2 mg. Take 1 tablet by mouth daily as directed by coumadin clinic) 120 tablet 1  . zolpidem (AMBIEN) 5 MG tablet Take one tablet by mouth at bedtime as needed for sleep 30 tablet 5   No current facility-administered medications for this visit.    Allergies:   Morphine and related    Social History:  The patient  reports that she has never smoked. She has never used smokeless tobacco. She reports that she does not drink alcohol or use illicit drugs.   Family History:  The patient's family history includes Diabetes in her mother.    ROS:  Please see the history of present illness.    Otherwise, review of systems positive for none.   All other systems are reviewed and negative.    PHYSICAL EXAM: VS:  BP 159/83 mmHg  Pulse 76  Resp 20  Ht 5\' 4"  (1.626 m)  Wt 124 lb 8 oz (56.473 kg)  BMI 21.36 kg/m2  SpO2 96% , BMI Body mass index is 21.36 kg/(m^2).  General: Alert, oriented x3, no distress Head: no evidence of trauma, PERRL, EOMI, no exophtalmos or lid lag, no myxedema, no xanthelasma; normal ears, nose and oropharynx Neck: normal jugular venous pulsations and no hepatojugular reflux; brisk carotid pulses without delay and no carotid bruits Chest: clear to auscultation, no signs of consolidation by percussion or palpation, normal fremitus, symmetrical and full respiratory excursions,  Healthy left subclavian pacemaker site Cardiovascular: normal position and quality of the apical impulse, regular rhythm, normal first and second heart sounds, no murmurs, rubs or  gallops Abdomen: no tenderness or distention, no masses by palpation, no abnormal pulsatility or arterial bruits, normal bowel sounds, no hepatosplenomegaly Extremities: no clubbing, cyanosis or edema; 2+ radial, ulnar and brachial pulses bilaterally; 2+ right femoral, posterior tibial and dorsalis pedis pulses; 2+ left femoral, posterior tibial and dorsalis pedis pulses; no subclavian or femoral bruits Neurological: grossly nonfocal Psych: euthymic mood, full affect   EKG:  EKG is ordered today. The ekg ordered today demonstrates  Atrial paced, ventricular sensed , mildly prolonged QTC 466 ms   Recent Labs: 02/23/2015: ALT 20; BUN 20; Creatinine, Ser 1.14*; Hemoglobin 12.7; Platelets 264; Potassium 4.2; Sodium 138 04/13/2015: TSH 1.542    Lipid Panel No results found for: CHOL, TRIG, HDL, CHOLHDL, VLDL, LDLCALC, LDLDIRECT    Wt Readings from Last 3 Encounters:  10/15/15 124 lb 8 oz (56.473 kg)  04/13/15 118 lb 4.8 oz (53.661 kg)  02/23/15 116 lb (52.617 kg)      Other studies  Reviewed: Additional studies/ records that were reviewed today include:  Chest x-ray studies from yesterday and previous x-rays performed this year.   ASSESSMENT AND PLAN:  1.  Sinus node dysfunction with normally functioning dual-chamber permanent pacemaker  No arrhythmias detected that could explain her complaints.  Not pacemaker dependent, but requires almost 100% atrial pacing with normal native AV node conduction  2. Paroxysmal atrial fibrillation with no recurrence in the last 12 months. Will stop the amiodarone altogether. While I think it is likely that she will have atrial fibrillation again in the future, the burden of arrhythmia is too low to justify a toxic medication such as amiodarone.  3.  Hyperlipidemia on simvastatin therapy. Since amiodarone is being stopped the drug interaction is no longer an issue  4.  Long-term anticoagulation with warfarin should be continued. Anticipate a gradual  need to further reduce warfarin dosage as the amiodarone wears off. CHADSVasc at least 4 (age 89, gender, HTN).  5.  Underweight , improved. She is no longer as severely malnourished that she was in the past  6.  Large paraesophageal hiatal hernia with multiple previous attempts at surgical repair. I suspect that an overfilled stomach might explain her episodes of dyspnea. I don't think she is experiencing heart failure related orthopnea.  7.  Essential hypertension, usually well compensated. Atypically high systolic blood pressure today. No changes were made to her medications. If elevated blood pressures consistently detected , will need to adjust her medications.    Current medicines are reviewed at length with the patient today.  The patient does not have concerns regarding medicines.  The following changes have been made:   Stop amiodarone  Labs/ tests ordered today include:   Orders Placed This Encounter  Procedures  . EKG 12-Lead    Patient Instructions  Your physician has recommended you make the following change in your medication:   STOP AMIODARONE  Dr. Sallyanne Kuster recommends that you schedule a follow-up appointment in: [place] ([facility] - [facility])         Mikael Spray, MD  10/15/2015 8:59 AM    Sanda Klein, MD, [facility] [phone] office [phone] pager

## 2015-10-18 ENCOUNTER — Ambulatory Visit: Payer: Medicare Other | Admitting: Pharmacist Clinician (PhC)/ Clinical Pharmacy Specialist

## 2015-10-27 ENCOUNTER — Ambulatory Visit: Payer: Medicare Other | Admitting: Urology

## 2015-10-27 ENCOUNTER — Other Ambulatory Visit: Payer: Self-pay | Admitting: Urology

## 2015-10-27 DIAGNOSIS — R3129 Other microscopic hematuria: Secondary | ICD-10-CM | POA: Diagnosis not present

## 2015-10-27 DIAGNOSIS — N390 Urinary tract infection, site not specified: Secondary | ICD-10-CM | POA: Diagnosis not present

## 2015-10-29 ENCOUNTER — Encounter: Payer: Medicare Other | Admitting: Pharmacist Clinician (PhC)/ Clinical Pharmacy Specialist

## 2015-11-01 ENCOUNTER — Encounter: Payer: Medicare Other | Admitting: Pharmacist Clinician (PhC)/ Clinical Pharmacy Specialist

## 2015-11-01 ENCOUNTER — Ambulatory Visit (HOSPITAL_COMMUNITY)
Admission: RE | Admit: 2015-11-01 | Discharge: 2015-11-01 | Disposition: A | Discharge status: Home / Self Care | Payer: Medicare Other | Source: Ambulatory Visit | Attending: Urology | Admitting: Urology

## 2015-11-01 DIAGNOSIS — N390 Urinary tract infection, site not specified: Secondary | ICD-10-CM | POA: Insufficient documentation

## 2015-11-02 LAB — CUP PACEART INCLINIC DEVICE CHECK
Battery Impedance: 208 Ohm
Brady Statistic AP VP Percent: 0 %
Brady Statistic AP VS Percent: 99 %
Brady Statistic AS VS Percent: 0 %
Date Time Interrogation Session: 20161202122206
Implantable Lead Location: 753859
Implantable Lead Model: 5076
Implantable Lead Model: 5076
Lead Channel Impedance Value: 552 Ohm
Lead Channel Setting Pacing Amplitude: 2 V
Lead Channel Setting Pacing Pulse Width: 0.4 ms
Lead Channel Setting Sensing Sensitivity: 4 mV
MDC IDC LEAD IMPLANT DT: 20120816
MDC IDC LEAD IMPLANT DT: 20120816
MDC IDC LEAD LOCATION: 753860
MDC IDC MSMT BATTERY REMAINING LONGEVITY: 130 mo
MDC IDC MSMT BATTERY VOLTAGE: 2.79 V
MDC IDC MSMT LEADCHNL RA IMPEDANCE VALUE: 500 Ohm
MDC IDC SET LEADCHNL RA PACING AMPLITUDE: 1.5 V
MDC IDC STAT BRADY AS VP PERCENT: 0 %

## 2015-11-04 ENCOUNTER — Ambulatory Visit (INDEPENDENT_AMBULATORY_CARE_PROVIDER_SITE_OTHER): Payer: Medicare Other | Admitting: Pharmacist Clinician (PhC)/ Clinical Pharmacy Specialist

## 2015-11-04 DIAGNOSIS — Z7901 Long term (current) use of anticoagulants: Secondary | ICD-10-CM | POA: Diagnosis not present

## 2015-11-04 DIAGNOSIS — I4891 Unspecified atrial fibrillation: Secondary | ICD-10-CM | POA: Diagnosis not present

## 2015-11-04 LAB — POCT INR: INR: 2.3

## 2015-11-10 ENCOUNTER — Ambulatory Visit (INDEPENDENT_AMBULATORY_CARE_PROVIDER_SITE_OTHER): Payer: Medicare Other | Admitting: Urology

## 2015-11-10 DIAGNOSIS — N390 Urinary tract infection, site not specified: Secondary | ICD-10-CM | POA: Diagnosis not present

## 2015-11-10 DIAGNOSIS — N362 Urethral caruncle: Secondary | ICD-10-CM | POA: Diagnosis not present

## 2015-11-12 ENCOUNTER — Encounter: Payer: Self-pay | Admitting: Cardiovascular Disease

## 2015-11-29 ENCOUNTER — Ambulatory Visit (INDEPENDENT_AMBULATORY_CARE_PROVIDER_SITE_OTHER): Payer: Medicare Other | Admitting: Pharmacist Clinician (PhC)/ Clinical Pharmacy Specialist

## 2015-11-29 DIAGNOSIS — Z7901 Long term (current) use of anticoagulants: Secondary | ICD-10-CM

## 2015-11-29 DIAGNOSIS — I4891 Unspecified atrial fibrillation: Secondary | ICD-10-CM

## 2015-11-29 LAB — POCT INR: INR: 1.6

## 2015-12-13 ENCOUNTER — Ambulatory Visit (INDEPENDENT_AMBULATORY_CARE_PROVIDER_SITE_OTHER): Payer: Medicare Other | Admitting: Pharmacist Clinician (PhC)/ Clinical Pharmacy Specialist

## 2015-12-13 DIAGNOSIS — I4891 Unspecified atrial fibrillation: Secondary | ICD-10-CM | POA: Diagnosis not present

## 2015-12-13 DIAGNOSIS — Z7901 Long term (current) use of anticoagulants: Secondary | ICD-10-CM | POA: Diagnosis not present

## 2015-12-13 LAB — POCT INR: INR: 2.1

## 2015-12-15 ENCOUNTER — Ambulatory Visit (INDEPENDENT_AMBULATORY_CARE_PROVIDER_SITE_OTHER): Payer: Medicare Other | Admitting: Urology

## 2015-12-15 DIAGNOSIS — N390 Urinary tract infection, site not specified: Secondary | ICD-10-CM

## 2015-12-24 ENCOUNTER — Telehealth: Payer: Self-pay | Admitting: *Deleted

## 2015-12-24 NOTE — Telephone Encounter (Signed)
Cardiac diagnosis and EF faxed to Julio Sicks, RN with [facility] faxed 12/21/2015.

## 2016-01-06 ENCOUNTER — Telehealth: Payer: Self-pay | Admitting: Cardiovascular Disease

## 2016-01-06 NOTE — Telephone Encounter (Signed)
Left message for Anne Anthony, the pt does not have a diagnosis of heart failure and her last EF% was 50-55% in 2015. She will call with other questions.

## 2016-01-06 NOTE — Telephone Encounter (Signed)
Calling because she is needing confirmation of a heart failure diagnosis and a ejection fraction . Please call   Thanks

## 2016-01-18 ENCOUNTER — Encounter: Payer: Medicare Other | Admitting: Cardiovascular Disease

## 2016-01-18 ENCOUNTER — Ambulatory Visit (INDEPENDENT_AMBULATORY_CARE_PROVIDER_SITE_OTHER): Payer: Medicare Other | Admitting: Pharmacist Clinician (PhC)/ Clinical Pharmacy Specialist

## 2016-01-18 DIAGNOSIS — I4891 Unspecified atrial fibrillation: Secondary | ICD-10-CM | POA: Diagnosis not present

## 2016-01-18 DIAGNOSIS — Z7901 Long term (current) use of anticoagulants: Secondary | ICD-10-CM

## 2016-01-18 LAB — POCT INR: INR: 1.6

## 2016-01-21 ENCOUNTER — Other Ambulatory Visit: Payer: Self-pay | Admitting: Cardiovascular Disease

## 2016-01-25 NOTE — Therapy (Signed)
[facility] [place], [place], [address] Phone: [phone]   Fax:  [phone]  Physical Therapy Treatment  Patient Details  Name: Anne Anthony MRN: [id number] Date of Birth: 1936/02/20 No Data Recorded  Encounter Date: 01/12/2015    Past Medical History  Diagnosis Date  . Atrial flutter ([place])     successful radiofrequency ablation 2007  . Paroxysmal atrial fibrillation (HCC)   . Tachycardia-bradycardia syndrome ([place])     permament pacermaker, dual chamber Medtronic on 06/2011  . Hypertension   . Hyperlipidemia   . Pacemaker   . Tubular adenoma   . Hiatal hernia   . Diverticula of colon   . Family history of colon cancer     brother  . Severe protein-calorie malnutrition ([place]) 11/17/2014    Past Surgical History  Procedure Laterality Date  . Permament pacemaker  06/2011    dual chamber , Medtronic Adapta serial # [id number]  last checked 02/05/2013  . Hiatal hernia repair  2005  . Radiofrequency ablation  06/28/2006  . Colonoscopy  08/17/2010    Dr. Gala Romney- internal hemorrhoids, o/w normal rectum, L sided diverticula, tubular adenoma  . Esophagogastroduodenoscopy  10/502011    Dr. Gala Romney- huge diaphragmatic/hiatial hernia, fundal gland polyps not manipulated o/w noral appearing gastric mucosa, patent pylorus, normal D1 & D2  . Partial hysterectomy    . Abdominal surgery    . Hip arthroplasty Left 11/16/2014    Procedure: ARTHROPLASTY BIPOLAR HIP;  Surgeon: Mauri Pole, MD;  Location: [facility];  Service: Orthopedics;  Laterality: Left;    There were no vitals filed for this visit.  Visit Diagnosis:  Left hip pain                                    PT Long Term Goals - 01/07/15 1643    PT LONG TERM GOAL #1   Title Independent with advanced HEP   Time 4   Period Weeks   Status New   PT LONG TERM GOAL #2   Title Increase left LE strength to 5/5.   Time 4   Period Weeks   Status New   PT LONG TERM GOAL #3   Title Eliminate [place] gait   Time 4   Period Weeks   Status New   PT LONG TERM GOAL #4   Title Perform a reciprocating stair git with one railing.   Time 4   Period Weeks   Status New   PT LONG TERM GOAL #5   Title Walk 500 feet with a straight cane   Time 4   Period Weeks               Problem List Patient Active Problem List   Diagnosis Date Noted  . Nasal congestion 12/01/2014  . Postoperative anemia due to acute blood loss 11/23/2014  . Protein-calorie malnutrition, severe ([place]) 11/17/2014  . Severe protein-calorie malnutrition ([place]) 11/17/2014  . Anticoagulated on Coumadin   . Left displaced femoral neck fracture ([place])   . Hip fracture ([place]) 11/14/2014  . Closed left hip fracture ([place]) 11/14/2014  . Chronic atrial fibrillation ([place]) 11/14/2014  . Back pain 05/14/2014  . Paroxysmal atrial fibrillation (HCC)   . Tachycardia-bradycardia syndrome ([place])   . Hypertension   . Hyperlipidemia   . Pacemaker   . Atrial fibrillation ([place]) 01/19/2013  . Long term current use of anticoagulant therapy  01/19/2013  . GERD 08/17/2010   PHYSICAL THERAPY DISCHARGE SUMMARY  Visits from Start of Care: 2  Current functional level related to goals / functional outcomes: Please see above.   Remaining deficits: Continued gait deviation.   Education / Equipment:  Plan: Patient agrees to discharge.  Patient goals were not met. Patient is being discharged due to not returning since the last visit.  ?????      Samay Delcarlo, [place] MPT 01/25/2016, 6:52 PM  [facility] [address] [place], [place], [address] Phone: [phone]   Fax:  [phone]  Name: Anne Anthony MRN: [id number] Date of Birth: 02-21-1936

## 2016-01-31 ENCOUNTER — Encounter: Payer: Medicare Other | Admitting: Pharmacist Clinician (PhC)/ Clinical Pharmacy Specialist

## 2016-02-07 ENCOUNTER — Encounter: Payer: Self-pay | Admitting: Cardiovascular Disease

## 2016-02-07 ENCOUNTER — Ambulatory Visit (INDEPENDENT_AMBULATORY_CARE_PROVIDER_SITE_OTHER): Payer: Medicare Other | Admitting: Cardiovascular Disease

## 2016-02-07 ENCOUNTER — Ambulatory Visit (INDEPENDENT_AMBULATORY_CARE_PROVIDER_SITE_OTHER): Payer: Medicare Other | Admitting: Pharmacist Clinician (PhC)/ Clinical Pharmacy Specialist

## 2016-02-07 VITALS — BP 97/60 | HR 75 | Ht 64.0 in | Wt 129.2 lb

## 2016-02-07 DIAGNOSIS — I10 Essential (primary) hypertension: Secondary | ICD-10-CM

## 2016-02-07 DIAGNOSIS — I480 Paroxysmal atrial fibrillation: Secondary | ICD-10-CM

## 2016-02-07 DIAGNOSIS — Z950 Presence of cardiac pacemaker: Secondary | ICD-10-CM

## 2016-02-07 DIAGNOSIS — Z7901 Long term (current) use of anticoagulants: Secondary | ICD-10-CM

## 2016-02-07 DIAGNOSIS — I495 Sick sinus syndrome: Secondary | ICD-10-CM

## 2016-02-07 DIAGNOSIS — I4891 Unspecified atrial fibrillation: Secondary | ICD-10-CM | POA: Diagnosis not present

## 2016-02-07 DIAGNOSIS — E785 Hyperlipidemia, unspecified: Secondary | ICD-10-CM

## 2016-02-07 LAB — POCT INR: INR: 2.2

## 2016-02-07 MED ORDER — IPRATROPIUM BROMIDE 0.03 % NA SOLN: 2.0000 | Freq: Two times a day (BID) | NASAL | Status: DC

## 2016-02-07 NOTE — Patient Instructions (Addendum)
Your physician recommends that you schedule a follow-up appointment in: 3 monthoffice visit with Pacemaker Check (MDT-GRAY) with Dr. Sallyanne Kuster.   Your physician has recommended you make the following change in your medication: start Atrovent (nasal spray) as directed

## 2016-02-07 NOTE — Progress Notes (Signed)
Patient ID: Anne Anthony, female   DOB: 10/10/1936, 80 y.o.   MRN: [id number]    Cardiology Office Note    Date:  02/08/2016   ID:  Anne Anthony, DOB April 23, 1936, MRN [id number]  PCP:  Purvis Kilts, MD  Cardiologist:   Sanda Klein, MD   Chief Complaint  Patient presents with  . Pacemaker Check    3 month  pt c/o Dyspnea--at rest and on exertion, no other Sx.    History of Present Illness:  Anne Anthony is a 80 y.o. female with long-standing paroxysmal atrial fibrillation and symptomatic sinus bradycardia, dual chamber Medtronic pacemaker implanted in 2012, hypertension and hyperlipidemia returns in follow-up after discontinuation of treatment with amiodarone about 3 months ago.  She was initially diagnosed with atrial flutter and had successful radiofrequency caval tricuspid isthmus ablation, but several years later returned with paroxysmal atrial fibrillation and required a dual-chamber permanent pacemaker for tachycardia-bradycardia syndrome (Medtronic, implanted August 2012). She had increased problems with atrial fibrillation around the time of paraesophageal hernia repair November 2015.  Amiodarone was started after the surgery and she has been free of atrial fibrillation for about 15 months. Interrogation of her pacemaker today again shows no evidence of atrial fibrillation whatsoever. Heart rate histogram distribution remains extremely blunted. She is quite sedentary. She has 99.5% atrial pacing but only 0.5% ventricular pacing  As before, she complains of fatigue and mild dyspnea, but denies angina, palpitations, syncope, leg edema, claudication, neurological complaints or other cardiovascular problems. She has not had any bleeding issues on warfarin anticoagulation. Has a constant runny nose that has not been helped by over-the-counter medications. She has a large hiatal hernia. Recently has not been troubled by nausea/vomiting but this has been a recurring complaint  over many years..  Past Medical History  Diagnosis Date  . Atrial flutter ([place])     successful radiofrequency ablation 2007  . Paroxysmal atrial fibrillation (HCC)   . Tachycardia-bradycardia syndrome ([place])     permament pacermaker, dual chamber Medtronic on 06/2011  . Hypertension   . Hyperlipidemia   . Pacemaker   . Tubular adenoma   . Hiatal hernia   . Diverticula of colon   . Family history of colon cancer     brother  . Severe protein-calorie malnutrition ([place]) 11/17/2014    Past Surgical History  Procedure Laterality Date  . Permament pacemaker  06/2011    dual chamber , Medtronic Adapta serial # [id number]  last checked 02/05/2013  . Hiatal hernia repair  2005  . Radiofrequency ablation  06/28/2006  . Colonoscopy  08/17/2010    Dr. Gala Romney- internal hemorrhoids, o/w normal rectum, L sided diverticula, tubular adenoma  . Esophagogastroduodenoscopy  10/502011    Dr. Gala Romney- huge diaphragmatic/hiatial hernia, fundal gland polyps not manipulated o/w noral appearing gastric mucosa, patent pylorus, normal D1 & D2  . Partial hysterectomy    . Abdominal surgery    . Hip arthroplasty Left 11/16/2014    Procedure: ARTHROPLASTY BIPOLAR HIP;  Surgeon: Mauri Pole, MD;  Location: [facility];  Service: Orthopedics;  Laterality: Left;    Outpatient Prescriptions Prior to Visit  Medication Sig Dispense Refill  . acetaminophen (TYLENOL) 325 MG tablet Take 1-2 tablets (325-650 mg total) by mouth every 4 (four) hours as needed for fever, headache or mild pain.    Marland Kitchen dexlansoprazole (DEXILANT) 60 MG capsule Take 1 capsule (60 mg total) by mouth daily. 30 capsule 11  . escitalopram (LEXAPRO) 10 MG tablet Take 10  mg by mouth daily.    Marland Kitchen gabapentin (NEURONTIN) 300 MG capsule 1 cap at bedtime 90 capsule 0  . meclizine (ANTIVERT) 25 MG tablet Take 25 mg by mouth 2 (two) times daily.    . metoprolol tartrate (LOPRESSOR) 25 MG tablet Take 25 mg by mouth 2 (two) times daily.    . simvastatin (ZOCOR) 20  MG tablet Take 20 mg by mouth every evening.    . warfarin (COUMADIN) 2 MG tablet Take 1 to 1.5 tablets by mouth daily as directed by coumadin clinic 120 tablet 1  . warfarin (COUMADIN) 2 MG tablet TAKE 1 TO 1.5 TABLETS BY MOUTH DAILY AS DIRECTED BY COUMADIN CLINIC 120 tablet 1  . zolpidem (AMBIEN) 5 MG tablet Take one tablet by mouth at bedtime as needed for sleep 30 tablet 5   No facility-administered medications prior to visit.     Allergies:   Morphine and related   Social History   Social History  . Marital Status: Widowed    Spouse Name: [name]  . Number of Children: 4  . Years of Education: [name]   Social History Main Topics  . Smoking status: Never Smoker   . Smokeless tobacco: Never Used  . Alcohol Use: No  . Drug Use: No  . Sexual Activity: Not Asked   Other Topics Concern  . None   Social History Narrative     Family History:  The patient's family history includes Diabetes in her mother.   ROS:   Please see the history of present illness.    ROS All other systems reviewed and are negative.   PHYSICAL EXAM:   VS:  BP 97/60 mmHg  Pulse 75  Ht 5\' 4"  (1.626 m)  Wt 58.605 kg (129 lb 3.2 oz)  BMI 22.17 kg/m2   GEN: Well nourished, well developed, in no acute distress HEENT: normal Neck: no JVD, carotid bruits, or masses Cardiac: RRR; no murmurs, rubs, or gallops,no edema , healthy subclavian pacemaker site Respiratory:  clear to auscultation bilaterally, normal work of breathing GI: soft, nontender, nondistended, + BS MS: no deformity or atrophy Skin: warm and dry, no rash Neuro:  Alert and Oriented x 3, Strength and sensation are intact Psych: euthymic mood, full affect  Wt Readings from Last 3 Encounters:  02/07/16 58.605 kg (129 lb 3.2 oz)  10/15/15 56.473 kg (124 lb 8 oz)  04/13/15 53.661 kg (118 lb 4.8 oz)      Studies/Labs Reviewed:   EKG:  EKG is not ordered today.  The intracardiac electrogram shows atrial paced ventricular sensed  rhythm  Recent Labs: 02/23/2015: ALT 20; BUN 20; Creatinine, Ser 1.14*; Hemoglobin 12.7; Platelets 264; Potassium 4.2; Sodium 138 04/13/2015: TSH 1.542   Lipid Panel No results found for: CHOL, TRIG, HDL, CHOLHDL, VLDL, LDLCALC, LDLDIRECT    ASSESSMENT:    1. Paroxysmal atrial fibrillation (HCC)   2. Tachycardia-bradycardia syndrome ([place])   3. Essential hypertension   4. Long term current use of anticoagulant therapy   5. Pacemaker   6. Hyperlipidemia      PLAN:  In order of problems listed above:  1. AFib: None noted after discontinuation of amiodarone 3 months ago. She will stay off amiodarone. Anticipate will continue to need gradual changes in dose of warfarin for another 3 months 2. Remote history of atrial flutter with successful ablation followed by atrial fibrillation rapid ventricular response, no serious problems since 2015 3. HTN: Her blood pressure is quite low. She does not take  any vasoactive medications except for metoprolol. She denies dizziness or lightheadedness. Recommend that she stay well-hydrated. Will continue current dose of beta blockers for now. 4. Anticoagulation: No bleeding complications 5. PM: Normal pacemaker function. Offered downloads every 3 months and office visits yearly, but the patient and her daughter both prefer an office evaluation. 6. HLP: Continue simvastatin, lower risk of side effects now that she is off amiodarone    Medication Adjustments/Labs and Tests Ordered: Current medicines are reviewed at length with the patient today.  Concerns regarding medicines are outlined above.  Medication changes, Labs and Tests ordered today are listed in the Patient Instructions below. Patient Instructions  Your physician recommends that you schedule a follow-up appointment in: 3 monthoffice visit with Pacemaker Check (MDT-GRAY) with Dr. Sallyanne Kuster.   Your physician has recommended you make the following change in your medication: start Atrovent  (nasal spray) as directed         Mikael Spray, MD  02/08/2016 5:01 PM    [facility] [place], [place], [place]  [address] Phone: [phone]; Fax: [phone]

## 2016-02-08 LAB — CUP PACEART INCLINIC DEVICE CHECK
Brady Statistic AP VP Percent: 0 %
Brady Statistic AP VS Percent: 99 %
Brady Statistic AS VP Percent: 0 %
Brady Statistic AS VS Percent: 0 %
Date Time Interrogation Session: 20170327160557
Implantable Lead Implant Date: 20120816
Lead Channel Setting Pacing Amplitude: 1.5 V
Lead Channel Setting Pacing Amplitude: 2 V
Lead Channel Setting Pacing Pulse Width: 0.4 ms
Lead Channel Setting Sensing Sensitivity: 4 mV
MDC IDC LEAD IMPLANT DT: 20120816
MDC IDC LEAD LOCATION: 753859
MDC IDC LEAD LOCATION: 753860
MDC IDC MSMT BATTERY IMPEDANCE: 208 Ohm
MDC IDC MSMT BATTERY REMAINING LONGEVITY: 129 mo
MDC IDC MSMT BATTERY VOLTAGE: 2.79 V
MDC IDC MSMT LEADCHNL RA IMPEDANCE VALUE: 479 Ohm
MDC IDC MSMT LEADCHNL RV IMPEDANCE VALUE: 517 Ohm

## 2016-02-21 ENCOUNTER — Encounter: Payer: Self-pay | Admitting: Cardiovascular Disease

## 2016-02-28 ENCOUNTER — Ambulatory Visit (INDEPENDENT_AMBULATORY_CARE_PROVIDER_SITE_OTHER): Payer: Medicare Other | Admitting: Pharmacist Clinician (PhC)/ Clinical Pharmacy Specialist

## 2016-02-28 DIAGNOSIS — I4891 Unspecified atrial fibrillation: Secondary | ICD-10-CM | POA: Diagnosis not present

## 2016-02-28 DIAGNOSIS — Z7901 Long term (current) use of anticoagulants: Secondary | ICD-10-CM

## 2016-02-28 LAB — POCT INR: INR: 2.5

## 2016-03-15 ENCOUNTER — Ambulatory Visit (INDEPENDENT_AMBULATORY_CARE_PROVIDER_SITE_OTHER): Payer: Medicare Other | Admitting: Urology

## 2016-03-15 DIAGNOSIS — N390 Urinary tract infection, site not specified: Secondary | ICD-10-CM | POA: Diagnosis not present

## 2016-03-15 DIAGNOSIS — R3129 Other microscopic hematuria: Secondary | ICD-10-CM

## 2016-03-18 ENCOUNTER — Other Ambulatory Visit: Payer: Self-pay | Admitting: Neurology

## 2016-03-27 ENCOUNTER — Ambulatory Visit (INDEPENDENT_AMBULATORY_CARE_PROVIDER_SITE_OTHER): Payer: Medicare Other | Admitting: Pharmacist Clinician (PhC)/ Clinical Pharmacy Specialist

## 2016-03-27 DIAGNOSIS — Z7901 Long term (current) use of anticoagulants: Secondary | ICD-10-CM | POA: Diagnosis not present

## 2016-03-27 DIAGNOSIS — I4891 Unspecified atrial fibrillation: Secondary | ICD-10-CM

## 2016-03-27 LAB — POCT INR: INR: 2.1

## 2016-03-30 ENCOUNTER — Other Ambulatory Visit (HOSPITAL_COMMUNITY): Payer: Self-pay | Admitting: Respiratory Therapy

## 2016-03-30 DIAGNOSIS — R0602 Shortness of breath: Secondary | ICD-10-CM

## 2016-04-14 ENCOUNTER — Ambulatory Visit (HOSPITAL_COMMUNITY)
Admission: RE | Admit: 2016-04-14 | Discharge: 2016-04-14 | Disposition: A | Discharge status: Home / Self Care | Payer: Medicare Other | Source: Ambulatory Visit | Attending: Pulmonary Disease | Admitting: Pulmonary Disease

## 2016-04-14 DIAGNOSIS — R0602 Shortness of breath: Secondary | ICD-10-CM | POA: Diagnosis present

## 2016-04-14 LAB — BLOOD GAS, ARTERIAL
ACID-BASE DEFICIT: 1.2 mmol/L (ref 0.0–2.0)
Bicarbonate: 23.5 mEq/L (ref 20.0–24.0)
FIO2: 0.21
O2 Saturation: 95.5 %
PH ART: 7.408 (ref 7.350–7.450)
pCO2 arterial: 36.9 mmHg (ref 35.0–45.0)
pO2, Arterial: 80.8 mmHg (ref 80.0–100.0)

## 2016-04-14 MED ORDER — ALBUTEROL SULFATE (2.5 MG/3ML) 0.083% IN NEBU
2.5000 mg | INHALATION_SOLUTION | Freq: Once | RESPIRATORY_TRACT | Status: AC
  Administered 2016-04-14: 2.5 mg via RESPIRATORY_TRACT

## 2016-04-15 LAB — PULMONARY FUNCTION TEST
DL/VA % pred: 106 %
DL/VA: 5.1 ml/min/mmHg/L
DLCO COR: 12.31 ml/min/mmHg
DLCO UNC % PRED: 51 %
DLCO UNC: 12.39 ml/min/mmHg
DLCO cor % pred: 50 %
FEF 25-75 PRE: 0.94 L/s
FEF 25-75 Post: 0.86 L/sec
FEF2575-%CHANGE-POST: -7 %
FEF2575-%PRED-POST: 57 %
FEF2575-%PRED-PRE: 62 %
FEV1-%Change-Post: 0 %
FEV1-%PRED-POST: 50 %
FEV1-%Pred-Pre: 50 %
FEV1-Post: 1.02 L
FEV1-Pre: 1.01 L
FEV1FVC-%Change-Post: 0 %
FEV1FVC-%PRED-PRE: 107 %
FEV6-%CHANGE-POST: 0 %
FEV6-%PRED-POST: 50 %
FEV6-%Pred-Pre: 49 %
FEV6-Post: 1.28 L
FEV6-Pre: 1.27 L
FEV6FVC-%Pred-Post: 105 %
FEV6FVC-%Pred-Pre: 105 %
FVC-%CHANGE-POST: 0 %
FVC-%Pred-Post: 47 %
FVC-%Pred-Pre: 47 %
FVC-Post: 1.28 L
FVC-Pre: 1.27 L
POST FEV1/FVC RATIO: 80 %
Post FEV6/FVC ratio: 100 %
Pre FEV1/FVC ratio: 80 %
Pre FEV6/FVC Ratio: 100 %
RV % pred: 82 %
RV: 1.94 L
TLC % pred: 63 %
TLC: 3.19 L

## 2016-04-28 ENCOUNTER — Ambulatory Visit (INDEPENDENT_AMBULATORY_CARE_PROVIDER_SITE_OTHER): Payer: Medicare Other | Admitting: Cardiovascular Disease

## 2016-04-28 ENCOUNTER — Encounter: Payer: Self-pay | Admitting: Cardiovascular Disease

## 2016-04-28 ENCOUNTER — Ambulatory Visit (INDEPENDENT_AMBULATORY_CARE_PROVIDER_SITE_OTHER): Payer: Medicare Other | Admitting: Pharmacist

## 2016-04-28 VITALS — BP 109/63 | HR 76 | Ht 64.0 in | Wt 125.4 lb

## 2016-04-28 DIAGNOSIS — Z7901 Long term (current) use of anticoagulants: Secondary | ICD-10-CM | POA: Diagnosis not present

## 2016-04-28 DIAGNOSIS — E785 Hyperlipidemia, unspecified: Secondary | ICD-10-CM

## 2016-04-28 DIAGNOSIS — Z5181 Encounter for therapeutic drug level monitoring: Secondary | ICD-10-CM

## 2016-04-28 DIAGNOSIS — I10 Essential (primary) hypertension: Secondary | ICD-10-CM | POA: Diagnosis not present

## 2016-04-28 DIAGNOSIS — I4891 Unspecified atrial fibrillation: Secondary | ICD-10-CM | POA: Diagnosis not present

## 2016-04-28 DIAGNOSIS — Z950 Presence of cardiac pacemaker: Secondary | ICD-10-CM | POA: Diagnosis not present

## 2016-04-28 DIAGNOSIS — I480 Paroxysmal atrial fibrillation: Secondary | ICD-10-CM | POA: Diagnosis not present

## 2016-04-28 DIAGNOSIS — R942 Abnormal results of pulmonary function studies: Secondary | ICD-10-CM | POA: Insufficient documentation

## 2016-04-28 LAB — POCT INR: INR: 1.6

## 2016-04-28 NOTE — Progress Notes (Signed)
Patient ID: Anne Anthony, female   DOB: 1936-10-16, 80 y.o.   MRN: [id number]    Cardiology Office Note    Date:  04/28/2016   ID:  Anne Anthony, DOB 1936-04-29, MRN [id number]  PCP:  Purvis Kilts, MD  Cardiologist:   Sanda Klein, MD   Chief Complaint  Patient presents with  . Chest Pain    pt states no chest pain   . Shortness of Breath    some    History of Present Illness:  Anne Anthony is a 80 y.o. female with long-standing paroxysmal atrial fibrillation and symptomatic sinus bradycardia, dual chamber Medtronic pacemaker implanted in 2012, hypertension and hyperlipidemia returns in follow-up after discontinuation of treatment with amiodarone about 6 months ago.  She was initially diagnosed with atrial flutter and had successful radiofrequency caval tricuspid isthmus ablation, but several years later returned with paroxysmal atrial fibrillation and required a dual-chamber permanent pacemaker for tachycardia-bradycardia syndrome (Medtronic, implanted August 2012). She had increased problems with atrial fibrillation around the time of paraesophageal hernia repair November 2015.  Amiodarone was started after the surgery and she has been free of atrial fibrillation for about 18 months. We decided to stop her amiodarone in March 2017.  Interrogation of her pacemaker today again shows no evidence of atrial fibrillation whatsoever.   Heart rate histogram distribution remains extremely blunted. She is quite sedentary. She has 99.5% atrial pacing but only 0.1% ventricular pacing  As before, she complains of fatigue and mild dyspnea, but denies angina, palpitations, syncope, leg edema, claudication, neurological complaints or other cardiovascular problems. She has not had any bleeding issues on warfarin anticoagulation. As expected, her warfarin requirements are gradually increased as the amiodarone wears off.  Pulmonary function tests that were recently performed show  significant restrictive ventilatory defect with FVC that is 50% of predicted and a commensurate reduction in FEV1 and diffusion capacity .her chest x-ray last performed in December did not show focal infiltrates but did confirm the presence of a large hiatal hernia with intrathoracic stomach gas.   Past Medical History  Diagnosis Date  . Atrial flutter ([place])     successful radiofrequency ablation 2007  . Paroxysmal atrial fibrillation (HCC)   . Tachycardia-bradycardia syndrome ([place])     permament pacermaker, dual chamber Medtronic on 06/2011  . Hypertension   . Hyperlipidemia   . Pacemaker   . Tubular adenoma   . Hiatal hernia   . Diverticula of colon   . Family history of colon cancer     brother  . Severe protein-calorie malnutrition ([place]) 11/17/2014    Past Surgical History  Procedure Laterality Date  . Permament pacemaker  06/2011    dual chamber , Medtronic Adapta serial # [id number]  last checked 02/05/2013  . Hiatal hernia repair  2005  . Radiofrequency ablation  06/28/2006  . Colonoscopy  08/17/2010    Dr. Gala Romney- internal hemorrhoids, o/w normal rectum, L sided diverticula, tubular adenoma  . Esophagogastroduodenoscopy  10/502011    Dr. Gala Romney- huge diaphragmatic/hiatial hernia, fundal gland polyps not manipulated o/w noral appearing gastric mucosa, patent pylorus, normal D1 & D2  . Partial hysterectomy    . Abdominal surgery    . Hip arthroplasty Left 11/16/2014    Procedure: ARTHROPLASTY BIPOLAR HIP;  Surgeon: Mauri Pole, MD;  Location: [facility];  Service: Orthopedics;  Laterality: Left;    Outpatient Prescriptions Prior to Visit  Medication Sig Dispense Refill  . acetaminophen (TYLENOL) 325 MG tablet Take 1-2  tablets (325-650 mg total) by mouth every 4 (four) hours as needed for fever, headache or mild pain.    Marland Kitchen dexlansoprazole (DEXILANT) 60 MG capsule Take 1 capsule (60 mg total) by mouth daily. 30 capsule 11  . escitalopram (LEXAPRO) 10 MG tablet Take 10 mg by mouth  daily.    Marland Kitchen gabapentin (NEURONTIN) 300 MG capsule 1 cap at bedtime 90 capsule 0  . ipratropium (ATROVENT) 0.03 % nasal spray Place 2 sprays into both nostrils every 12 (twelve) hours. 30 mL 3  . meclizine (ANTIVERT) 25 MG tablet Take 25 mg by mouth 2 (two) times daily.    . metoprolol tartrate (LOPRESSOR) 25 MG tablet Take 25 mg by mouth 2 (two) times daily.    . simvastatin (ZOCOR) 20 MG tablet Take 20 mg by mouth every evening.    . trimethoprim (TRIMPEX) 100 MG tablet Take 100 mg by mouth daily.    Marland Kitchen warfarin (COUMADIN) 2 MG tablet Take 1 to 1.5 tablets by mouth daily as directed by coumadin clinic 120 tablet 1  . warfarin (COUMADIN) 2 MG tablet TAKE 1 TO 1.5 TABLETS BY MOUTH DAILY AS DIRECTED BY COUMADIN CLINIC 120 tablet 1  . zolpidem (AMBIEN) 5 MG tablet Take one tablet by mouth at bedtime as needed for sleep 30 tablet 5   No facility-administered medications prior to visit.     Allergies:   Morphine and related   Social History   Social History  . Marital Status: Widowed    Spouse Name: [name]  . Number of Children: 4  . Years of Education: [name]   Social History Main Topics  . Smoking status: Never Smoker   . Smokeless tobacco: Never Used  . Alcohol Use: No  . Drug Use: No  . Sexual Activity: Not Asked   Other Topics Concern  . None   Social History Narrative     Family History:  The patient's family history includes Diabetes in her mother.   ROS:   Please see the history of present illness.    ROS All other systems reviewed and are negative.   PHYSICAL EXAM:   VS:  BP 109/63 mmHg  Pulse 76  Ht 5\' 4"  (1.626 m)  Wt 56.881 kg (125 lb 6.4 oz)  BMI 21.51 kg/m2   GEN: Well nourished, well developed, in no acute distress HEENT: normal Neck: no JVD, carotid bruits, or masses Cardiac: RRR; no murmurs, rubs, or gallops,no edema , healthy subclavian pacemaker site Respiratory:  clear to auscultation bilaterally, normal work of breathing GI: soft, nontender,  nondistended, + BS MS: no deformity or atrophy Skin: warm and dry, no rash Neuro:  Alert and Oriented x 3, Strength and sensation are intact Psych: euthymic mood, full affect  Wt Readings from Last 3 Encounters:  04/28/16 56.881 kg (125 lb 6.4 oz)  02/07/16 58.605 kg (129 lb 3.2 oz)  10/15/15 56.473 kg (124 lb 8 oz)      Studies/Labs Reviewed:   EKG:  EKG is ordered today.  It shows atrial paced, ventricular sensed rhythm with incomplete right bundle branch block (QRS just under 100 ms), QTC 446 ms.    ASSESSMENT:    1. Paroxysmal atrial fibrillation (HCC)   2. Essential hypertension   3. Anticoagulated on Coumadin   4. Pacemaker   5. Hyperlipidemia   6. Restrictive ventilatory defect      PLAN:  In order of problems listed above:  1. AFib: None noted after discontinuation of amiodarone 6 months  ago. She will stay off amiodarone, especially after reviewing the PFT results. Anticipate the effects of amiodarone have completely resolved by now when she will not require many more adjustments in warfarin dosage. Remote history of atrial flutter with successful ablation followed by atrial fibrillation rapid ventricular response, no serious problems since 2015 2. HTN: Her blood pressure is normal. She denies dizziness or lightheadedness. Recommend that she stay well-hydrated. Will continue current dose of beta blockers for now. 3. Anticoagulation: No bleeding complications. Warfarin dose adjustments made today  4. PM: Normal pacemaker function. Offered downloads every 3 months and office visits yearly, but the patient prefers in-office evaluation. 5. HLP: Continue simvastatin, lower risk of side effects now that she is off amiodarone 6. Restrictive ventilatory defect: At least by chest x-ray no sign of amiodarone related lung disease; I wonder for hiatal hernia is large enough to cause this problem. She is due to see Dr. Luan Pulling in follow-up.    Medication Adjustments/Labs and  Tests Ordered: Current medicines are reviewed at length with the patient today.  Concerns regarding medicines are outlined above.  Medication changes, Labs and Tests ordered today are listed in the Patient Instructions below. There are no Patient Instructions on file for this visit.     Signed, Sanda Klein, MD  04/28/2016 2:00 PM    [address], [place], [place]  [address] Phone: [phone]; Fax: [phone]

## 2016-04-28 NOTE — Patient Instructions (Signed)
Your physician recommends that you continue on your current medications as directed. Please refer to the Current Medication list given to you today.  Dr Sallyanne Kuster recommends that you schedule a follow-up appointment in 3 months.  If you need a refill on your cardiac medications before your next appointment, please call your pharmacy.

## 2016-05-11 ENCOUNTER — Other Ambulatory Visit (HOSPITAL_COMMUNITY): Payer: Self-pay | Admitting: Pulmonary Disease

## 2016-05-11 DIAGNOSIS — J849 Interstitial pulmonary disease, unspecified: Secondary | ICD-10-CM

## 2016-05-19 ENCOUNTER — Ambulatory Visit (HOSPITAL_COMMUNITY)
Admission: RE | Admit: 2016-05-19 | Discharge: 2016-05-19 | Disposition: A | Discharge status: Home / Self Care | Payer: Medicare Other | Source: Ambulatory Visit | Attending: Pulmonary Disease | Admitting: Pulmonary Disease

## 2016-05-19 DIAGNOSIS — J9811 Atelectasis: Secondary | ICD-10-CM | POA: Insufficient documentation

## 2016-05-19 DIAGNOSIS — J849 Interstitial pulmonary disease, unspecified: Secondary | ICD-10-CM | POA: Insufficient documentation

## 2016-05-19 DIAGNOSIS — I2510 Atherosclerotic heart disease of native coronary artery without angina pectoris: Secondary | ICD-10-CM | POA: Insufficient documentation

## 2016-05-19 DIAGNOSIS — E041 Nontoxic single thyroid nodule: Secondary | ICD-10-CM | POA: Insufficient documentation

## 2016-05-19 DIAGNOSIS — I700 Atherosclerosis of aorta: Secondary | ICD-10-CM | POA: Insufficient documentation

## 2016-05-19 DIAGNOSIS — K449 Diaphragmatic hernia without obstruction or gangrene: Secondary | ICD-10-CM | POA: Diagnosis not present

## 2016-05-22 ENCOUNTER — Ambulatory Visit (INDEPENDENT_AMBULATORY_CARE_PROVIDER_SITE_OTHER): Payer: Medicare Other | Admitting: Pharmacist

## 2016-05-22 DIAGNOSIS — Z7901 Long term (current) use of anticoagulants: Secondary | ICD-10-CM

## 2016-05-22 DIAGNOSIS — I4891 Unspecified atrial fibrillation: Secondary | ICD-10-CM

## 2016-05-22 LAB — POCT INR: INR: 1.6

## 2016-05-30 LAB — CUP PACEART INCLINIC DEVICE CHECK
Battery Impedance: 281 Ohm
Battery Remaining Longevity: 119 mo
Brady Statistic AP VP Percent: 0 %
Implantable Lead Implant Date: 20120816
Implantable Lead Location: 753859
Implantable Lead Model: 5076
Implantable Lead Model: 5076
Lead Channel Setting Sensing Sensitivity: 4 mV
MDC IDC LEAD IMPLANT DT: 20120816
MDC IDC LEAD LOCATION: 753860
MDC IDC MSMT BATTERY VOLTAGE: 2.79 V
MDC IDC MSMT LEADCHNL RA IMPEDANCE VALUE: 508 Ohm
MDC IDC MSMT LEADCHNL RV IMPEDANCE VALUE: 550 Ohm
MDC IDC SESS DTM: 20170616165542
MDC IDC SET LEADCHNL RA PACING AMPLITUDE: 1.5 V
MDC IDC SET LEADCHNL RV PACING AMPLITUDE: 2 V
MDC IDC SET LEADCHNL RV PACING PULSEWIDTH: 0.4 ms
MDC IDC STAT BRADY AP VS PERCENT: 99 %
MDC IDC STAT BRADY AS VP PERCENT: 0 %
MDC IDC STAT BRADY AS VS PERCENT: 0 %

## 2016-06-05 ENCOUNTER — Ambulatory Visit (INDEPENDENT_AMBULATORY_CARE_PROVIDER_SITE_OTHER): Payer: Medicare Other | Admitting: Pharmacist

## 2016-06-05 DIAGNOSIS — Z7901 Long term (current) use of anticoagulants: Secondary | ICD-10-CM | POA: Diagnosis not present

## 2016-06-05 DIAGNOSIS — I4891 Unspecified atrial fibrillation: Secondary | ICD-10-CM | POA: Diagnosis not present

## 2016-06-05 LAB — POCT INR: INR: 1.7

## 2016-06-06 ENCOUNTER — Other Ambulatory Visit: Payer: Self-pay | Admitting: Gastroenterology

## 2016-06-19 ENCOUNTER — Encounter (INDEPENDENT_AMBULATORY_CARE_PROVIDER_SITE_OTHER): Payer: Self-pay

## 2016-06-19 ENCOUNTER — Ambulatory Visit (INDEPENDENT_AMBULATORY_CARE_PROVIDER_SITE_OTHER): Payer: Medicare Other | Admitting: Pharmacist Clinician (PhC)/ Clinical Pharmacy Specialist

## 2016-06-19 DIAGNOSIS — Z7901 Long term (current) use of anticoagulants: Secondary | ICD-10-CM | POA: Diagnosis not present

## 2016-06-19 DIAGNOSIS — I4891 Unspecified atrial fibrillation: Secondary | ICD-10-CM

## 2016-06-19 LAB — POCT INR: INR: 2.1

## 2016-06-21 ENCOUNTER — Ambulatory Visit: Payer: Medicare Other | Admitting: Urology

## 2016-07-10 ENCOUNTER — Ambulatory Visit (INDEPENDENT_AMBULATORY_CARE_PROVIDER_SITE_OTHER): Payer: Medicare Other | Admitting: Pharmacist Clinician (PhC)/ Clinical Pharmacy Specialist

## 2016-07-10 DIAGNOSIS — Z7901 Long term (current) use of anticoagulants: Secondary | ICD-10-CM | POA: Diagnosis not present

## 2016-07-10 DIAGNOSIS — I4891 Unspecified atrial fibrillation: Secondary | ICD-10-CM | POA: Diagnosis not present

## 2016-07-10 LAB — POCT INR: INR: 1.9

## 2016-07-24 ENCOUNTER — Ambulatory Visit (INDEPENDENT_AMBULATORY_CARE_PROVIDER_SITE_OTHER): Payer: Medicare Other | Admitting: Pharmacist

## 2016-07-24 DIAGNOSIS — Z7901 Long term (current) use of anticoagulants: Secondary | ICD-10-CM | POA: Diagnosis not present

## 2016-07-24 DIAGNOSIS — I4891 Unspecified atrial fibrillation: Secondary | ICD-10-CM | POA: Diagnosis not present

## 2016-07-24 LAB — POCT INR: INR: 2.7

## 2016-08-09 ENCOUNTER — Encounter: Payer: Self-pay | Admitting: Cardiovascular Disease

## 2016-08-09 ENCOUNTER — Ambulatory Visit (INDEPENDENT_AMBULATORY_CARE_PROVIDER_SITE_OTHER): Payer: Medicare Other | Admitting: Pharmacist Clinician (PhC)/ Clinical Pharmacy Specialist

## 2016-08-09 ENCOUNTER — Ambulatory Visit (INDEPENDENT_AMBULATORY_CARE_PROVIDER_SITE_OTHER): Payer: Medicare Other | Admitting: Cardiovascular Disease

## 2016-08-09 VITALS — BP 112/70 | HR 92 | Ht 64.0 in | Wt 124.8 lb

## 2016-08-09 DIAGNOSIS — I10 Essential (primary) hypertension: Secondary | ICD-10-CM

## 2016-08-09 DIAGNOSIS — R942 Abnormal results of pulmonary function studies: Secondary | ICD-10-CM

## 2016-08-09 DIAGNOSIS — R6889 Other general symptoms and signs: Secondary | ICD-10-CM

## 2016-08-09 DIAGNOSIS — I4891 Unspecified atrial fibrillation: Secondary | ICD-10-CM

## 2016-08-09 DIAGNOSIS — Z7901 Long term (current) use of anticoagulants: Secondary | ICD-10-CM

## 2016-08-09 DIAGNOSIS — Z950 Presence of cardiac pacemaker: Secondary | ICD-10-CM

## 2016-08-09 DIAGNOSIS — I480 Paroxysmal atrial fibrillation: Secondary | ICD-10-CM

## 2016-08-09 DIAGNOSIS — E785 Hyperlipidemia, unspecified: Secondary | ICD-10-CM | POA: Diagnosis not present

## 2016-08-09 LAB — POCT INR: INR: 2.1

## 2016-08-09 NOTE — Progress Notes (Signed)
Patient ID: Anne Anthony, female   DOB: [birth year], 80 y.o.   MRN: [id number]    Cardiology Office Note    Date:  08/10/2016   ID:  Anne Anthony, DOB 1936-04-11, MRN [id number]  PCP:  Purvis Kilts, MD  Cardiologist:   Sanda Klein, MD   Chief Complaint  Patient presents with  . Follow-up    patient reports no complaints    History of Present Illness:  Anne Anthony is a 80 y.o. female with long-standing paroxysmal atrial fibrillation and symptomatic sinus bradycardia, dual chamber Medtronic pacemaker implanted in 2012, hypertension and hyperlipidemia returns in follow-up after discontinuation of treatment with amiodarone about 9 months ago.  She was initially diagnosed with atrial flutter and had successful radiofrequency caval tricuspid isthmus ablation, but several years later returned with paroxysmal atrial fibrillation and required a dual-chamber permanent pacemaker for tachycardia-bradycardia syndrome (Medtronic, implanted August 2012). She had increased problems with atrial fibrillation around the time of paraesophageal hernia repair November 2015. Amiodarone was started after that surgery and she has been free of atrial fibrillation since then. We decided to stop her amiodarone and interrogation of her pacemaker today again shows no evidence of atrial fibrillation whatsoever.   Heart rate histogram distribution remains rather blunted. She is quite sedentary. She has 99% atrial pacing but only 0.1% ventricular pacing. Battery longevity is estimated at about 10 years. They have been 28 episodes of atrial mode switch but all are less than 1 minute in duration and none resembled atrial fibrillation  As before, she complains of fatigue and mild dyspnea, but denies angina, palpitations, syncope, leg edema, claudication, neurological complaints or other cardiovascular problems. She has not had any bleeding issues on warfarin anticoagulation.  Her biggest complaints surround  rhinorrhea, hoarseness especially when she comes back in from outside. The hoarseness has now been present for many weeks. Chest CT performed on July 7 shows a large hiatal hernia with herniation of the splenic flexure of the colon and several small bowel loops into the chest. She does have bilateral compressive atelectasis in both lung bases.  Pulmonary function tests that were recently performed show significant restrictive ventilatory defect with FVC that is 50% of predicted and a commensurate reduction in FEV1 and diffusion capacity . Her chest x-ray last performed in December did not show focal infiltrates but did confirm the presence of a large hiatal hernia with intrathoracic stomach gas.   Past Medical History:  Diagnosis Date  . Atrial flutter ([place])    successful radiofrequency ablation 2007  . Diverticula of colon   . Family history of colon cancer    brother  . Hiatal hernia   . Hyperlipidemia   . Hypertension   . Pacemaker   . Paroxysmal atrial fibrillation (HCC)   . Severe protein-calorie malnutrition ([place]) 11/17/2014  . Tachycardia-bradycardia syndrome ([place])    permament pacermaker, dual chamber Medtronic on 06/2011  . Tubular adenoma     Past Surgical History:  Procedure Laterality Date  . ABDOMINAL SURGERY    . COLONOSCOPY  08/17/2010   Dr. Gala Romney- internal hemorrhoids, o/w normal rectum, L sided diverticula, tubular adenoma  . ESOPHAGOGASTRODUODENOSCOPY  10/502011   Dr. Gala Romney- huge diaphragmatic/hiatial hernia, fundal gland polyps not manipulated o/w noral appearing gastric mucosa, patent pylorus, normal D1 & D2  . HIATAL HERNIA REPAIR  2005  . HIP ARTHROPLASTY Left 11/16/2014   Procedure: ARTHROPLASTY BIPOLAR HIP;  Surgeon: Mauri Pole, MD;  Location: [facility];  Service: Orthopedics;  Laterality: Left;  . PARTIAL HYSTERECTOMY    . permament pacemaker  06/2011   dual chamber , Medtronic Adapta serial # [id number]  last checked 02/05/2013  . RADIOFREQUENCY ABLATION   06/28/2006    Outpatient Medications Prior to Visit  Medication Sig Dispense Refill  . acetaminophen (TYLENOL) 325 MG tablet Take 1-2 tablets (325-650 mg total) by mouth every 4 (four) hours as needed for fever, headache or mild pain.    Marland Kitchen DEXILANT 60 MG capsule TAKE 1 CAPSULE (60 MG TOTAL) BY MOUTH DAILY. 30 capsule 5  . escitalopram (LEXAPRO) 10 MG tablet Take 10 mg by mouth daily.    Marland Kitchen gabapentin (NEURONTIN) 300 MG capsule 1 cap at bedtime 90 capsule 0  . ipratropium (ATROVENT) 0.03 % nasal spray Place 2 sprays into both nostrils every 12 (twelve) hours. 30 mL 3  . meclizine (ANTIVERT) 25 MG tablet Take 25 mg by mouth 2 (two) times daily.    . metoprolol tartrate (LOPRESSOR) 25 MG tablet Take 25 mg by mouth 2 (two) times daily.    . simvastatin (ZOCOR) 20 MG tablet Take 20 mg by mouth every evening.    . trimethoprim (TRIMPEX) 100 MG tablet Take 100 mg by mouth daily.    Marland Kitchen warfarin (COUMADIN) 2 MG tablet Take 1 to 1.5 tablets by mouth daily as directed by coumadin clinic 120 tablet 1  . warfarin (COUMADIN) 2 MG tablet TAKE 1 TO 1.5 TABLETS BY MOUTH DAILY AS DIRECTED BY COUMADIN CLINIC 120 tablet 1  . zolpidem (AMBIEN) 5 MG tablet Take one tablet by mouth at bedtime as needed for sleep 30 tablet 5   No facility-administered medications prior to visit.      Allergies:   Morphine and related   Social History   Social History  . Marital status: Widowed    Spouse name: [name]  . Number of children: 4  . Years of education: [name]   Social History Main Topics  . Smoking status: Never Smoker  . Smokeless tobacco: Never Used  . Alcohol use No  . Drug use: No  . Sexual activity: Not Asked   Other Topics Concern  . None   Social History Narrative  . None     Family History:  The patient's family history includes Diabetes in her mother.   ROS:   Please see the history of present illness.    ROS All other systems reviewed and are negative.   PHYSICAL EXAM:   VS:  BP 112/70    Pulse 92   Ht 5\' 4"  (1.626 m)   Wt 56.6 kg (124 lb 12.8 oz)   BMI 21.42 kg/m    GEN: Well nourished, well developed, in no acute distress  HEENT: normal Except hoarseness Neck: no JVD, carotid bruits, or masses Cardiac: RRR; no murmurs, rubs, or gallops,no edema , healthy subclavian pacemaker site Respiratory:  clear to auscultation bilaterally, normal work of breathing. She has a persistent unusual localized wheezing over her right lower lung base that does not clear with cough. GI: soft, nontender, nondistended, + BS MS: no deformity or atrophy  Skin: warm and dry, no rash Neuro:  Alert and Oriented x 3, Strength and sensation are intact Psych: euthymic mood, full affect  Wt Readings from Last 3 Encounters:  08/09/16 56.6 kg (124 lb 12.8 oz)  04/28/16 56.9 kg (125 lb 6.4 oz)  02/07/16 58.6 kg (129 lb 3.2 oz)      Studies/Labs Reviewed:   EKG:  EKG is  not ordered today.   ASSESSMENT:    1. Paroxysmal atrial fibrillation (HCC)   2. Essential hypertension   3. Long term current use of anticoagulant therapy   4. Pacemaker   5. Hyperlipidemia   6. Restrictive ventilatory defect   7. Abnormal exam/test finding      PLAN:  In order of problems listed above:  1. AFib: None noted after discontinuation of amiodarone. She will stay off amiodarone, especially considering the PFT results. Anticipate the effects of amiodarone have completely resolved by now. Remote history of atrial flutter with successful ablation followed by atrial fibrillation rapid ventricular response, no serious problems since 2015. It is possible that atrial fibrillation will recur again during periods of physiological stress such as surgery. 2. HTN: Her blood pressure is normal. Will continue current dose of beta blockers for now. 3. Anticoagulation: No bleeding complications.  4. PM: Normal pacemaker function. The patient prefers in-office evaluation. We'll schedule in 6 months 5. HLP: Continue  simvastatin. 6. Restrictive ventilatory defect: At least by previous chest x-ray no sign of amiodarone related lung disease. Her CT shows large amounts of herniated abdominal viscera in her chest and this probably explains the restrictive defect . She complains of hoarseness and has an abnormal exam. Also note that she has weight loss. We'll schedule for another chest x-ray. She has an unusual wheezing sound localized to her right lung base and will check chest x-ray. Her hoarseness could be due to continued problems with gastroesophageal reflux disease or seasonal/environmental allergies. Will make sure there are no structural abnormalities on chest x-ray. She might need to see an ENT specialist.   Medication Adjustments/Labs and Tests Ordered: Current medicines are reviewed at length with the patient today.  Concerns regarding medicines are outlined above.  Medication changes, Labs and Tests ordered today are listed in the Patient Instructions below. Patient Instructions  Medication Instructions: Dr Sallyanne Kuster recommends that you continue on your current medications as directed. Please refer to the Current Medication list given to you today.  Labwork: NONE ORDERED  Testing/Procedures: 1. Chest X-ray - A chest x-ray takes a picture of the organs and structures inside the chest, including the heart, lungs, and blood vessels. This test can show several things, including, whether the heart is enlarges; whether fluid is building up in the lungs; and whether pacemaker / defibrillator leads are still in place. This has been ordered to be completed at [facility].  Follow-up: Dr Sallyanne Kuster recommends that you schedule a follow-up appointment in 6 months with a pacemaker check. You will receive a reminder letter in the mail two months in advance. If you don't receive a letter, please call our office to schedule the follow-up appointment.  If you need a refill on your cardiac medications before your next  appointment, please call your pharmacy.      Signed, Sanda Klein, MD  08/10/2016 1:59 PM    [facility] [address], [place], [place]  [address] Phone: [phone]; Fax: [phone]

## 2016-08-09 NOTE — Patient Instructions (Signed)
Medication Instructions: Dr Sallyanne Kuster recommends that you continue on your current medications as directed. Please refer to the Current Medication list given to you today.  Labwork: NONE ORDERED  Testing/Procedures: 1. Chest X-ray - A chest x-ray takes a picture of the organs and structures inside the chest, including the heart, lungs, and blood vessels. This test can show several things, including, whether the heart is enlarges; whether fluid is building up in the lungs; and whether pacemaker / defibrillator leads are still in place. This has been ordered to be completed at [facility].  Follow-up: Dr Sallyanne Kuster recommends that you schedule a follow-up appointment in 6 months with a pacemaker check. You will receive a reminder letter in the mail two months in advance. If you don't receive a letter, please call our office to schedule the follow-up appointment.  If you need a refill on your cardiac medications before your next appointment, please call your pharmacy.

## 2016-08-15 ENCOUNTER — Ambulatory Visit (HOSPITAL_COMMUNITY)
Admission: RE | Admit: 2016-08-15 | Discharge: 2016-08-15 | Disposition: A | Discharge status: Home / Self Care | Payer: Medicare Other | Source: Ambulatory Visit | Attending: Cardiovascular Disease | Admitting: Cardiovascular Disease

## 2016-08-15 DIAGNOSIS — R6889 Other general symptoms and signs: Secondary | ICD-10-CM | POA: Diagnosis present

## 2016-08-16 LAB — CUP PACEART INCLINIC DEVICE CHECK
Battery Impedance: 281 Ohm
Battery Voltage: 2.78 V
Brady Statistic AP VS Percent: 99 %
Brady Statistic AS VP Percent: 0 %
Implantable Lead Implant Date: 20120816
Implantable Lead Location: 753860
Implantable Lead Model: 5076
Implantable Lead Model: 5076
Lead Channel Impedance Value: 515 Ohm
Lead Channel Impedance Value: 561 Ohm
Lead Channel Pacing Threshold Pulse Width: 0.4 ms
Lead Channel Setting Pacing Amplitude: 2 V
MDC IDC LEAD IMPLANT DT: 20120816
MDC IDC LEAD LOCATION: 753859
MDC IDC MSMT BATTERY REMAINING LONGEVITY: 120 mo
MDC IDC MSMT LEADCHNL RA PACING THRESHOLD AMPLITUDE: 0.5 V
MDC IDC MSMT LEADCHNL RV PACING THRESHOLD AMPLITUDE: 0.625 V
MDC IDC MSMT LEADCHNL RV PACING THRESHOLD PULSEWIDTH: 0.4 ms
MDC IDC MSMT LEADCHNL RV SENSING INTR AMPL: 8 mV
MDC IDC SESS DTM: 20170927163909
MDC IDC SET LEADCHNL RA PACING AMPLITUDE: 1.5 V
MDC IDC SET LEADCHNL RV PACING PULSEWIDTH: 0.4 ms
MDC IDC SET LEADCHNL RV SENSING SENSITIVITY: 4 mV
MDC IDC STAT BRADY AP VP PERCENT: 0 %
MDC IDC STAT BRADY AS VS PERCENT: 1 %

## 2016-08-19 IMAGING — DX DG CHEST 1V
1 series · 1 of 1 positions shown · non-contrast
Comparison: 11/14/2014.

CLINICAL DATA: Decreased breath sounds.

EXAM:
CHEST - 1 VIEW

[chest ap]
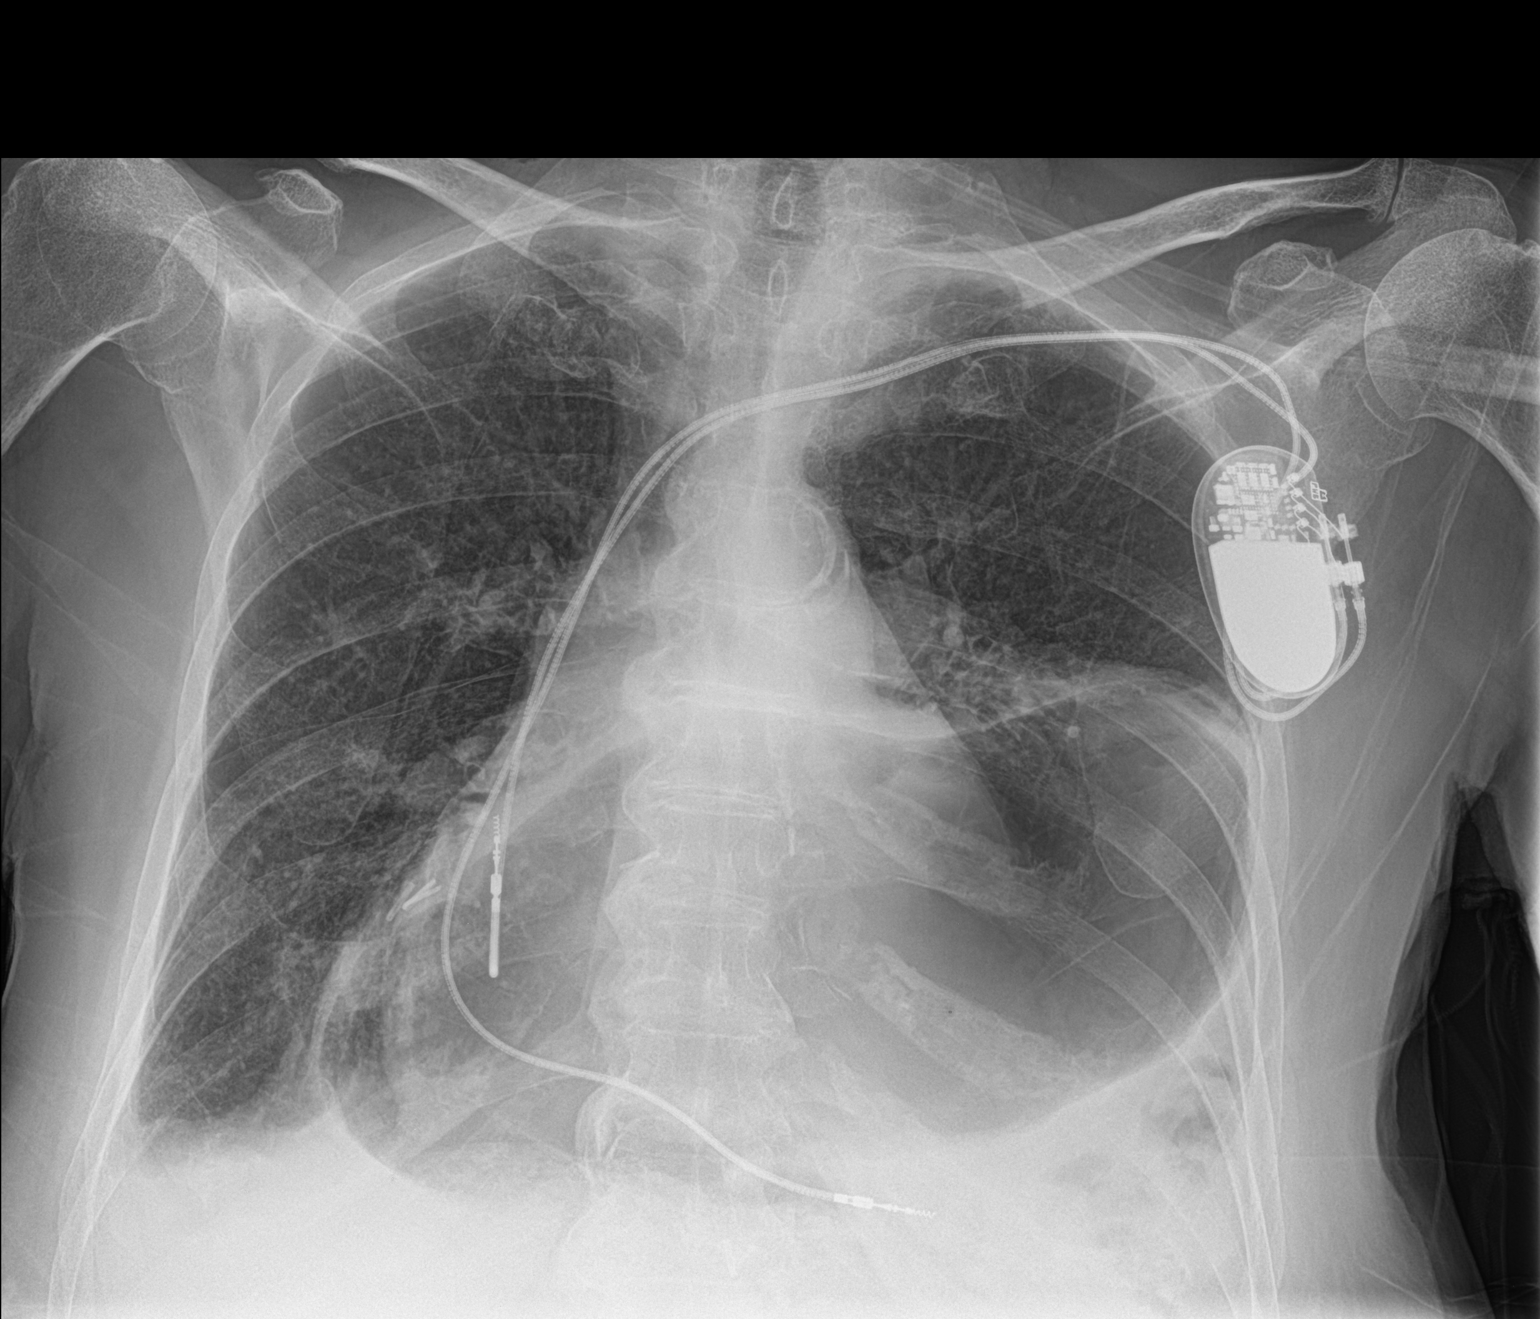

[1 of 1 positions shown; findings below may reference images not displayed]

FINDINGS: Very large hiatal hernia is present with gas in the inferior half of
the chest. Dual lead LEFT subclavian cardiac pacemaker. The
cardiopericardial silhouette appears within normal limits for size,
partially visible through the intrathoracic stomach. Aortic arch
atherosclerosis. There is a small RIGHT pleural effusion with RIGHT
basilar atelectasis.
IMPRESSION: 1. Large hiatal hernia with gas in the intrathoracic stomach.
2. Small RIGHT pleural effusion and associated atelectasis.
3. No gross focal consolidation.

## 2016-09-19 ENCOUNTER — Ambulatory Visit (INDEPENDENT_AMBULATORY_CARE_PROVIDER_SITE_OTHER): Payer: Medicare Other | Admitting: Pharmacist

## 2016-09-19 DIAGNOSIS — Z7901 Long term (current) use of anticoagulants: Secondary | ICD-10-CM

## 2016-09-19 DIAGNOSIS — I4891 Unspecified atrial fibrillation: Secondary | ICD-10-CM | POA: Diagnosis not present

## 2016-09-19 LAB — POCT INR: INR: 1.8

## 2016-10-10 ENCOUNTER — Ambulatory Visit (INDEPENDENT_AMBULATORY_CARE_PROVIDER_SITE_OTHER): Payer: Medicare Other | Admitting: Pharmacist Clinician (PhC)/ Clinical Pharmacy Specialist

## 2016-10-10 DIAGNOSIS — Z7901 Long term (current) use of anticoagulants: Secondary | ICD-10-CM | POA: Diagnosis not present

## 2016-10-10 DIAGNOSIS — I4891 Unspecified atrial fibrillation: Secondary | ICD-10-CM | POA: Diagnosis not present

## 2016-10-10 LAB — POCT INR: INR: 3.2

## 2016-10-27 IMAGING — CT CT HEAD W/O CM
3 series · 17 of 30 positions shown, 19 images · non-contrast
Comparison: November 28, 2005

CLINICAL DATA: Pain following fall.

EXAM:
CT HEAD WITHOUT CONTRAST
TECHNIQUE: Contiguous axial images were obtained from the base of the skull
through the vertex without intravenous contrast.

[Series 2: head w/o · axial · non-contrast · 0.45mm/px · z∈[+254,+369]mm · 7 of 31 slices shown (1 of 2)]
[im 4/31  brain]
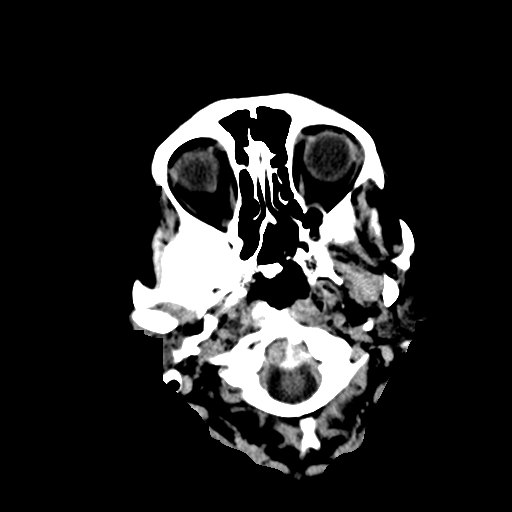
[im 8/31  brain]
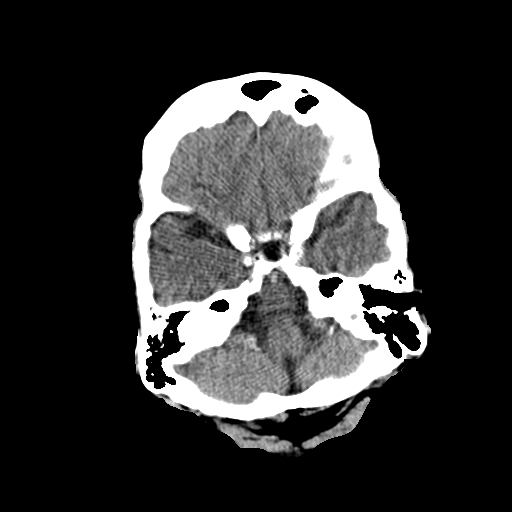
[im 12/31  brain]
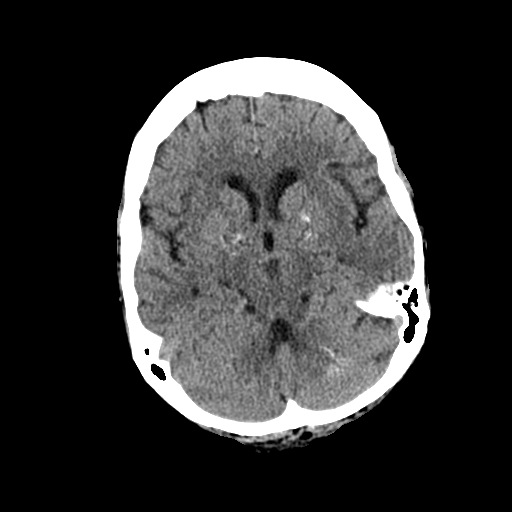
[im 16/31  brain]
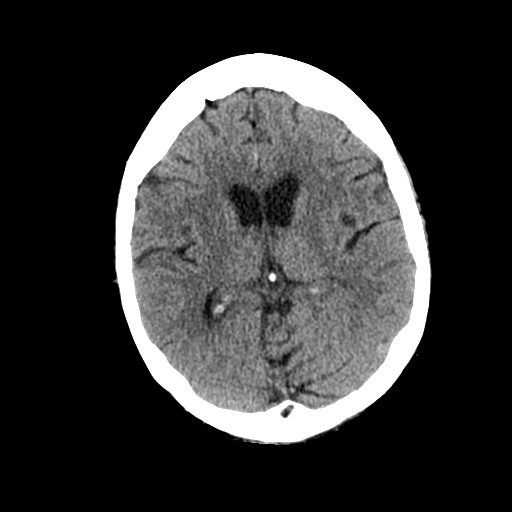
[im 19/31  brain]
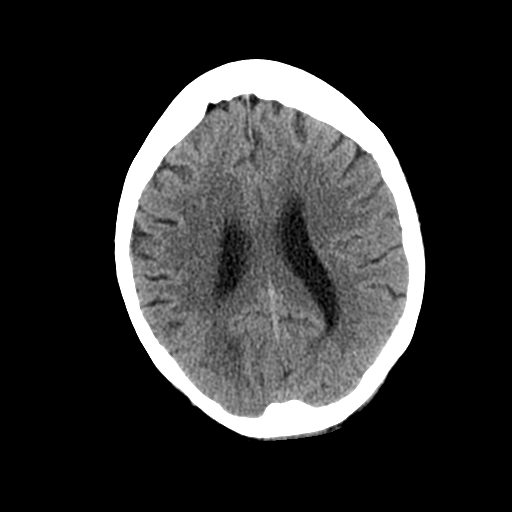
[im 23/31  brain]
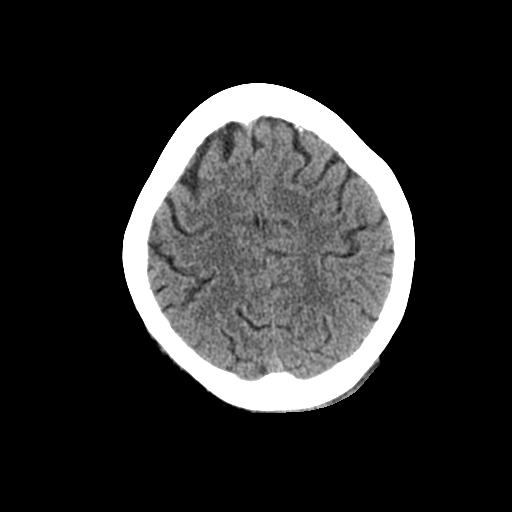
[im 27/31  brain]
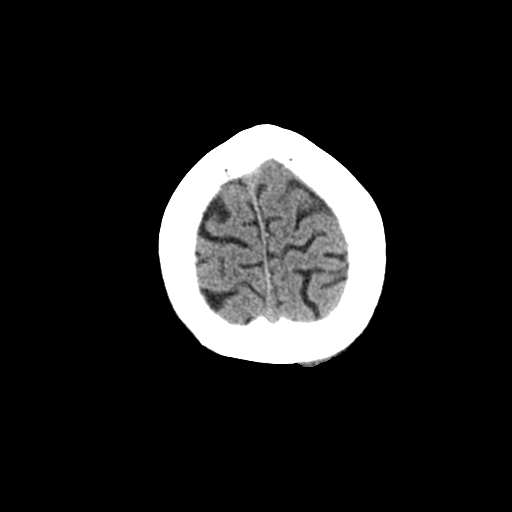

[Series 3: bone windows · axial · 0.45mm/px · z∈[+251,+299]mm · 4 of 51 slices shown]
[im 5/51  bone]
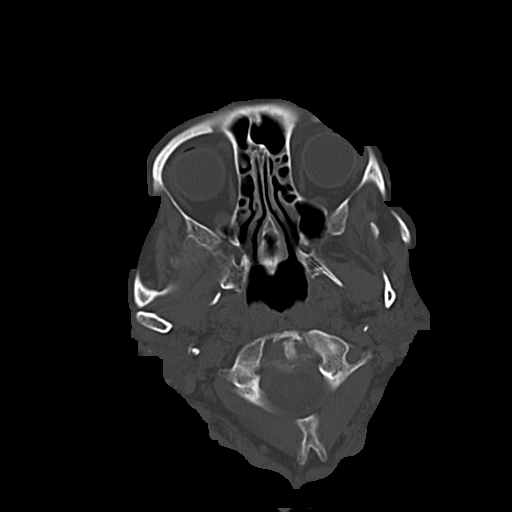
[im 13/51  bone]
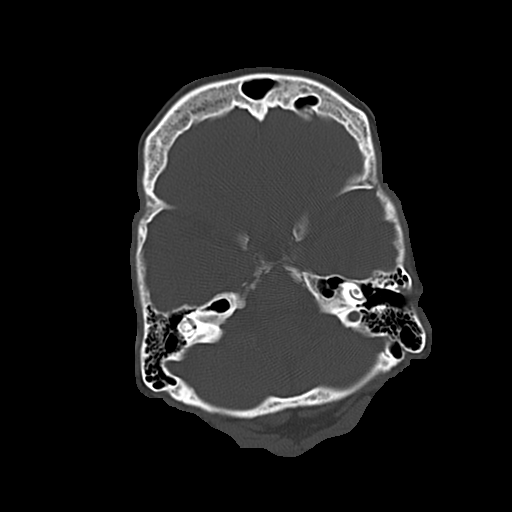
[im 17/51  bone]
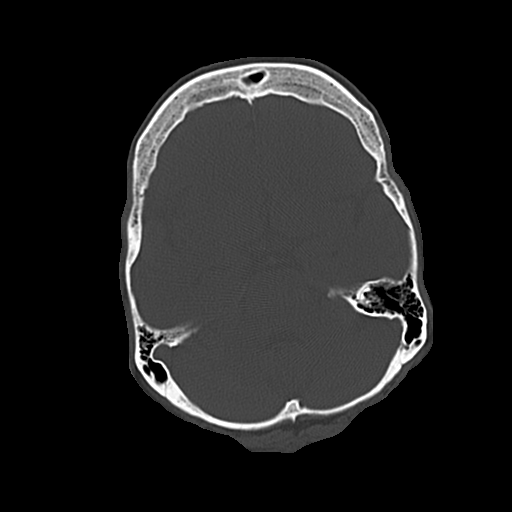
[im 21/51  bone]
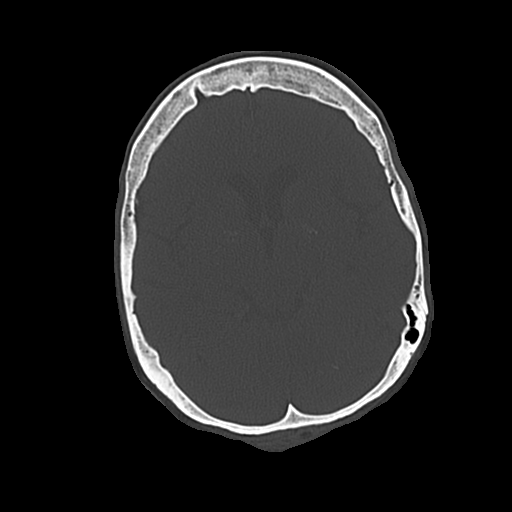

[Series 4: head w/o · axial · non-contrast · 0.45mm/px · z∈[+234,+337]mm · 6 of 32 slices shown, 8 images (2 of 2)]
[im 5/32  brain]
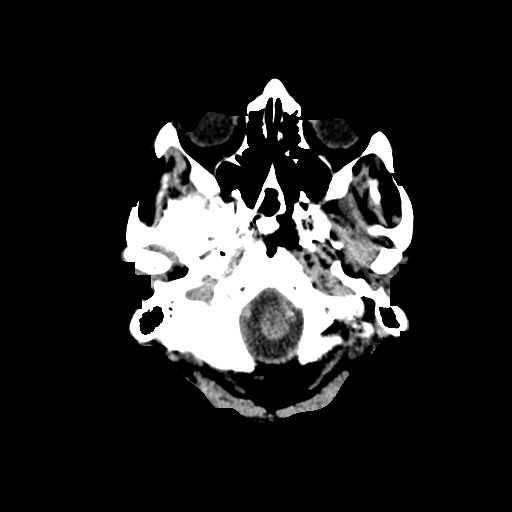
[im 5/32  bone]
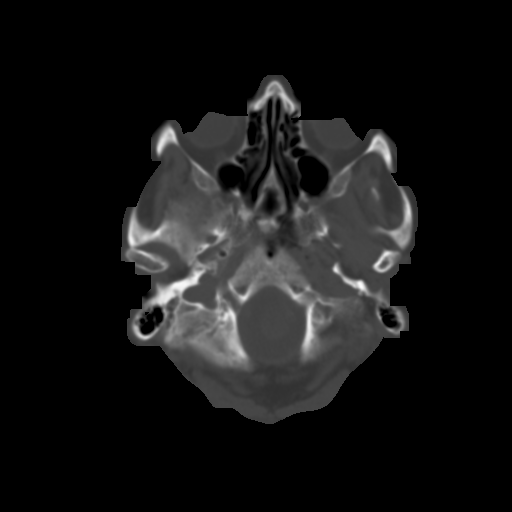
[im 9/32  brain]
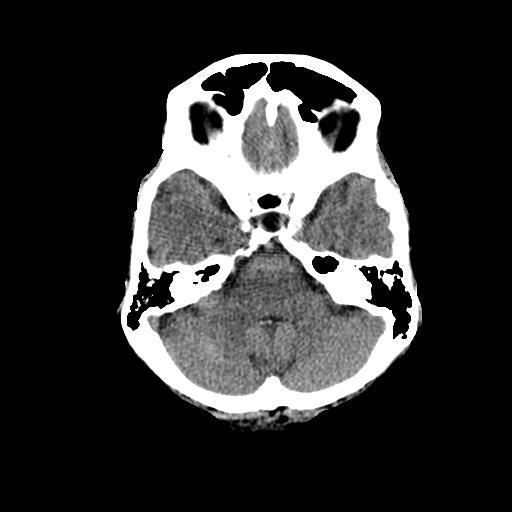
[im 14/32  brain]
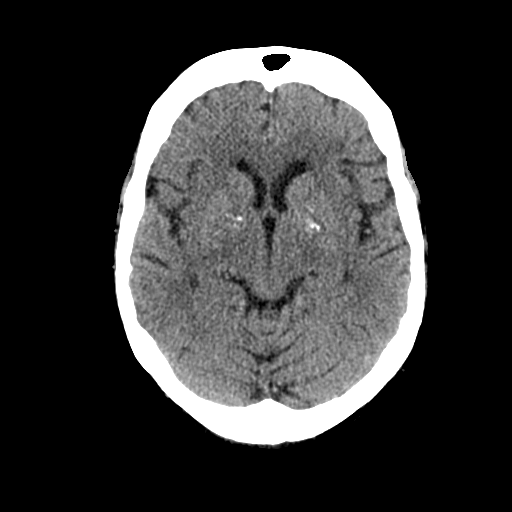
[im 18/32  brain]
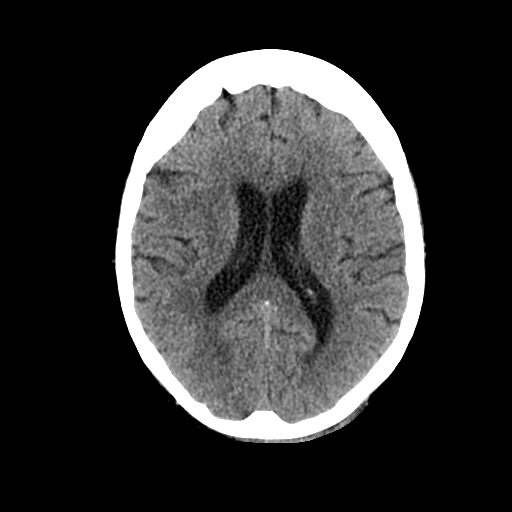
[im 23/32  brain]
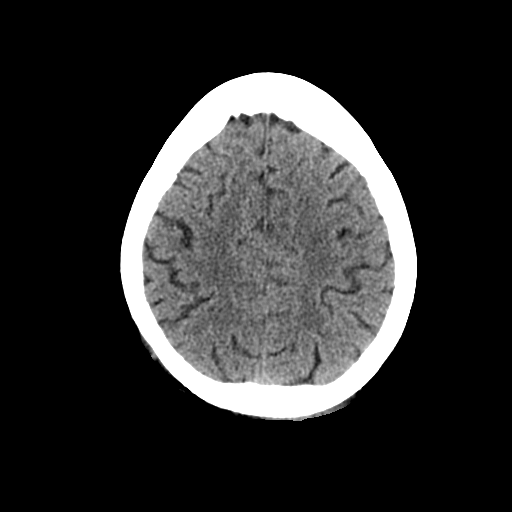
[im 23/32  bone]
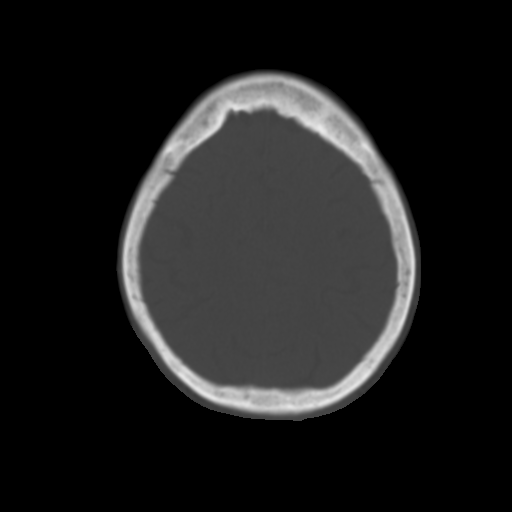
[im 27/32  brain]
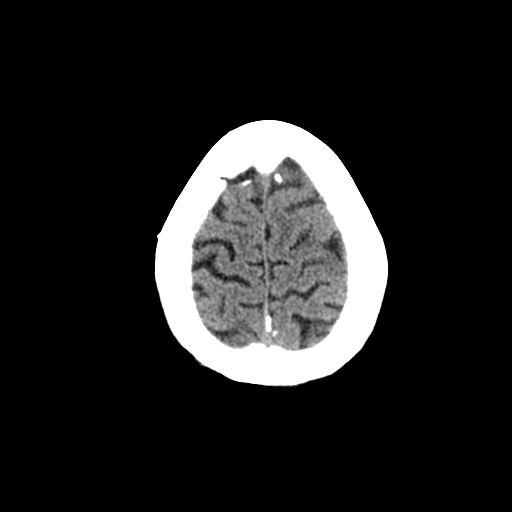

[17 of 30 positions shown; findings below may reference images not displayed]

FINDINGS: The ventricles are normal in size and configuration. There is no
mass, hemorrhage, extra-axial fluid collection, or midline shift.
There is slight small vessel disease in the centra semiovale
bilaterally. Elsewhere gray-white compartments appear normal. No
acute appearing infarct. Basal ganglia calcification bilaterally is
felt to be physiologic in this age group. Bony calvarium appears
intact. The mastoid air cells are clear. There is a medial left
parietal scalp hematoma. There is also a scalp hematoma just to the
right of midline at the superior cerebellar level at the level of
the right occipital bone medially.
IMPRESSION: Posterior scalp hematomas. Slight periventricular small vessel
disease. No intracranial mass, hemorrhage, acute appearing infarct,
or extra-axial fluid collection.

## 2016-10-31 ENCOUNTER — Ambulatory Visit (INDEPENDENT_AMBULATORY_CARE_PROVIDER_SITE_OTHER): Payer: Medicare Other | Admitting: Pharmacist

## 2016-10-31 DIAGNOSIS — I4891 Unspecified atrial fibrillation: Secondary | ICD-10-CM | POA: Diagnosis not present

## 2016-10-31 DIAGNOSIS — Z7901 Long term (current) use of anticoagulants: Secondary | ICD-10-CM | POA: Diagnosis not present

## 2016-10-31 LAB — POCT INR: INR: 2.4

## 2016-11-02 ENCOUNTER — Other Ambulatory Visit: Payer: Self-pay | Admitting: Cardiovascular Disease

## 2016-11-28 ENCOUNTER — Ambulatory Visit (INDEPENDENT_AMBULATORY_CARE_PROVIDER_SITE_OTHER): Payer: Medicare Other | Admitting: Pharmacist Clinician (PhC)/ Clinical Pharmacy Specialist

## 2016-11-28 DIAGNOSIS — I4891 Unspecified atrial fibrillation: Secondary | ICD-10-CM | POA: Diagnosis not present

## 2016-11-28 DIAGNOSIS — Z7901 Long term (current) use of anticoagulants: Secondary | ICD-10-CM

## 2016-11-28 LAB — POCT INR: INR: 2.4

## 2016-12-10 ENCOUNTER — Other Ambulatory Visit: Payer: Self-pay | Admitting: Gastroenterology

## 2016-12-27 ENCOUNTER — Ambulatory Visit (INDEPENDENT_AMBULATORY_CARE_PROVIDER_SITE_OTHER): Payer: Medicare Other | Admitting: Pharmacist

## 2016-12-27 DIAGNOSIS — I4891 Unspecified atrial fibrillation: Secondary | ICD-10-CM | POA: Diagnosis not present

## 2016-12-27 DIAGNOSIS — Z7901 Long term (current) use of anticoagulants: Secondary | ICD-10-CM

## 2016-12-27 LAB — POCT INR: INR: 2.7

## 2017-02-07 ENCOUNTER — Ambulatory Visit (INDEPENDENT_AMBULATORY_CARE_PROVIDER_SITE_OTHER): Payer: Medicare Other | Admitting: Pharmacist

## 2017-02-07 DIAGNOSIS — I4891 Unspecified atrial fibrillation: Secondary | ICD-10-CM

## 2017-02-07 DIAGNOSIS — Z7901 Long term (current) use of anticoagulants: Secondary | ICD-10-CM | POA: Diagnosis not present

## 2017-02-07 LAB — POCT INR: INR: 2.8

## 2017-03-07 ENCOUNTER — Encounter: Payer: Medicare Other | Admitting: Cardiovascular Disease

## 2017-03-23 ENCOUNTER — Ambulatory Visit (INDEPENDENT_AMBULATORY_CARE_PROVIDER_SITE_OTHER): Payer: Medicare Other | Admitting: Pharmacist Clinician (PhC)/ Clinical Pharmacy Specialist

## 2017-03-23 DIAGNOSIS — Z7901 Long term (current) use of anticoagulants: Secondary | ICD-10-CM | POA: Diagnosis not present

## 2017-03-23 DIAGNOSIS — I4891 Unspecified atrial fibrillation: Secondary | ICD-10-CM

## 2017-03-23 LAB — POCT INR: INR: 2.3

## 2017-05-01 ENCOUNTER — Other Ambulatory Visit: Payer: Self-pay | Admitting: Cardiovascular Disease

## 2017-05-04 ENCOUNTER — Ambulatory Visit (INDEPENDENT_AMBULATORY_CARE_PROVIDER_SITE_OTHER): Payer: Medicare Other | Admitting: Pharmacist Clinician (PhC)/ Clinical Pharmacy Specialist

## 2017-05-04 DIAGNOSIS — Z7901 Long term (current) use of anticoagulants: Secondary | ICD-10-CM

## 2017-05-04 DIAGNOSIS — I4891 Unspecified atrial fibrillation: Secondary | ICD-10-CM

## 2017-05-04 LAB — POCT INR: INR: 2.1

## 2017-05-24 ENCOUNTER — Ambulatory Visit (INDEPENDENT_AMBULATORY_CARE_PROVIDER_SITE_OTHER): Payer: Medicare Other | Admitting: Cardiovascular Disease

## 2017-05-24 ENCOUNTER — Encounter: Payer: Self-pay | Admitting: Cardiovascular Disease

## 2017-05-24 VITALS — BP 138/80 | HR 88 | Ht 64.0 in | Wt 124.0 lb

## 2017-05-24 DIAGNOSIS — Z7901 Long term (current) use of anticoagulants: Secondary | ICD-10-CM

## 2017-05-24 DIAGNOSIS — J984 Other disorders of lung: Secondary | ICD-10-CM | POA: Diagnosis not present

## 2017-05-24 DIAGNOSIS — I10 Essential (primary) hypertension: Secondary | ICD-10-CM | POA: Diagnosis not present

## 2017-05-24 DIAGNOSIS — I480 Paroxysmal atrial fibrillation: Secondary | ICD-10-CM | POA: Diagnosis not present

## 2017-05-24 DIAGNOSIS — I495 Sick sinus syndrome: Secondary | ICD-10-CM | POA: Diagnosis not present

## 2017-05-24 DIAGNOSIS — Z950 Presence of cardiac pacemaker: Secondary | ICD-10-CM | POA: Diagnosis not present

## 2017-05-24 DIAGNOSIS — E7800 Pure hypercholesterolemia, unspecified: Secondary | ICD-10-CM

## 2017-05-24 NOTE — Progress Notes (Signed)
Patient ID: Anne Anthony, female   DOB: October 07, 1936, 81 y.o.   MRN: [id number]    Cardiology Office Note    Date:  05/24/2017   ID:  Anne Anthony, DOB [birth year], MRN [id number]  PCP:  Sharilyn Sites, MD  Cardiologist:   Sanda Klein, MD   No chief complaint on file.   History of Present Illness:  Anne Anthony is a 81 y.o. female with long-standing paroxysmal atrial fibrillation and symptomatic sinus bradycardia, dual chamber Medtronic pacemaker implanted in 2012, hypertension and hyperlipidemia returns in follow-up.  It has now been over a year since discontinuation of amiodarone, due to concerns about worsening lung status. Despite stopping the antiarrhythmic she has very little in the way of atrial problems. 180 episodes of mode switch episode recorded but the longest one is only 3 seconds in duration. They all appear to be brief bursts of ectopic atrial tachycardia.  Otherwise her pacemaker shows normal function. Implanted in 2012 with still has roughly 9 years of estimated longevity. She has 98.6% atrial pacing and less than 0.3% ventricular pacing. Lead parameters are excellent. Heart rate histogram distribution is appropriate for her rather sedentary lifestyle. She prefers in office pacemaker checks and does not want to do remote downloads..  She was initially diagnosed with atrial flutter and had successful radiofrequency caval tricuspid isthmus ablation, but several years later returned with paroxysmal atrial fibrillation and required a dual-chamber permanent pacemaker for tachycardia-bradycardia syndrome (Medtronic, implanted August 2012). She had increased problems with atrial fibrillation around the time of paraesophageal hernia repair November 2015, when amiodarone was started, but subsequently there has been marked reduction in arrhythmia.  The patient specifically denies any chest pain at rest or exertion, dyspnea at rest, orthopnea, paroxysmal nocturnal dyspnea, syncope,  palpitations, focal neurological deficits, intermittent claudication, lower extremity edema, unexplained weight gain, cough, hemoptysis or wheezing. Denies bleeding or falls or injuries. Has functional class II exertional dyspnea.  Chest CT performed  July 2017 shows a large hiatal hernia with herniation of the splenic flexure of the colon and several small bowel loops into the chest. She does have bilateral compressive atelectasis in both lung bases. Pulmonary function tests 2017 show significant restrictive ventilatory defect with FVC that is 50% of predicted and a commensurate reduction in FEV1 and diffusion capacity . Her chest x-ray last performed in December did not show focal infiltrates but did confirm the presence of a large hiatal hernia with intrathoracic stomach gas.   Past Medical History:  Diagnosis Date  . Atrial flutter ([place])    successful radiofrequency ablation 2007  . Diverticula of colon   . Family history of colon cancer    brother  . Hiatal hernia   . Hyperlipidemia   . Hypertension   . Pacemaker   . Paroxysmal atrial fibrillation (HCC)   . Severe protein-calorie malnutrition ([place]) 11/17/2014  . Tachycardia-bradycardia syndrome ([place])    permament pacermaker, dual chamber Medtronic on 06/2011  . Tubular adenoma     Past Surgical History:  Procedure Laterality Date  . ABDOMINAL SURGERY    . COLONOSCOPY  08/17/2010   Dr. Gala Romney- internal hemorrhoids, o/w normal rectum, L sided diverticula, tubular adenoma  . ESOPHAGOGASTRODUODENOSCOPY  10/502011   Dr. Gala Romney- huge diaphragmatic/hiatial hernia, fundal gland polyps not manipulated o/w noral appearing gastric mucosa, patent pylorus, normal D1 & D2  . HIATAL HERNIA REPAIR  2005  . HIP ARTHROPLASTY Left 11/16/2014   Procedure: ARTHROPLASTY BIPOLAR HIP;  Surgeon: Mauri Pole, MD;  Location: [facility];  Service: Orthopedics;  Laterality: Left;  . PARTIAL HYSTERECTOMY    . permament pacemaker  06/2011   dual chamber , Medtronic  Adapta serial # [id number]  last checked 02/05/2013  . RADIOFREQUENCY ABLATION  06/28/2006    Outpatient Medications Prior to Visit  Medication Sig Dispense Refill  . acetaminophen (TYLENOL) 325 MG tablet Take 1-2 tablets (325-650 mg total) by mouth every 4 (four) hours as needed for fever, headache or mild pain.    Marland Kitchen DEXILANT 60 MG capsule TAKE 1 CAPSULE (60 MG TOTAL) BY MOUTH DAILY. 30 capsule 5  . gabapentin (NEURONTIN) 300 MG capsule 1 cap at bedtime 90 capsule 0  . ipratropium (ATROVENT) 0.03 % nasal spray Place 2 sprays into both nostrils every 12 (twelve) hours. 30 mL 3  . meclizine (ANTIVERT) 25 MG tablet Take 25 mg by mouth 2 (two) times daily.    . metoprolol tartrate (LOPRESSOR) 25 MG tablet Take 25 mg by mouth 2 (two) times daily.    . simvastatin (ZOCOR) 20 MG tablet Take 20 mg by mouth every evening.    . trimethoprim (TRIMPEX) 100 MG tablet Take 100 mg by mouth daily.    Marland Kitchen warfarin (COUMADIN) 2 MG tablet Take 1 to 1.5 tablets by mouth daily as directed by coumadin clinic 120 tablet 1  . warfarin (COUMADIN) 2 MG tablet TAKE 1 TO 1.5 TABLETS BY MOUTH DAILY AS DIRECTED BY COUMADIN CLINIC 120 tablet 1  . zolpidem (AMBIEN) 5 MG tablet Take one tablet by mouth at bedtime as needed for sleep 30 tablet 5  . escitalopram (LEXAPRO) 10 MG tablet Take 10 mg by mouth daily.     No facility-administered medications prior to visit.      Allergies:   Morphine and related   Social History   Social History  . Marital status: Widowed    Spouse name: [name]  . Number of children: 4  . Years of education: [name]   Social History Main Topics  . Smoking status: Never Smoker  . Smokeless tobacco: Never Used  . Alcohol use No  . Drug use: No  . Sexual activity: Not Asked   Other Topics Concern  . None   Social History Narrative  . None     Family History:  The patient's family history includes Diabetes in her mother.   ROS:   Please see the history of present illness.    ROS All  other systems reviewed and are negative.   PHYSICAL EXAM:   VS:  BP 138/80   Pulse 88   Ht 5\' 4"  (1.626 m)   Wt 124 lb (56.2 kg)   BMI 21.28 kg/m    GEN: Well nourished, well developed, in no acute distress  HEENT: normal Except hoarseness Neck: no JVD, carotid bruits, or masses Cardiac: RRR; no murmurs, rubs, or gallops,no edema , healthy subclavian pacemaker site Respiratory:  clear to auscultation bilaterally, normal work of breathing. She has a persistent unusual localized wheezing over her right lower lung base that does not clear with cough. GI: soft, nontender, nondistended, + BS MS: no deformity or atrophy  Skin: warm and dry, no rash Neuro:  Alert and Oriented x 3, Strength and sensation are intact Psych: euthymic mood, full affect  Wt Readings from Last 3 Encounters:  05/24/17 124 lb (56.2 kg)  08/09/16 124 lb 12.8 oz (56.6 kg)  04/28/16 125 lb 6.4 oz (56.9 kg)      Studies/Labs Reviewed:   EKG:  EKG is ordered today. She was atrial paced, ventricular sensed rhythm with nonspecific T-wave flattening in leads 1 and aVL, normal QTC 423 ms  ASSESSMENT:    1. Paroxysmal atrial fibrillation (HCC)   2. Essential hypertension   3. Long term current use of anticoagulant therapy   4. Tachycardia-bradycardia syndrome ([place])   5. Pacemaker   6. Pure hypercholesterolemia   7. Restrictive lung disease      PLAN:  In order of problems listed above:  1. AFib: Atrial fibrillation increased around the time of her attempted diaphragmatic hernia repair, but has subsequently melted away. I doubt that she had amiodarone lung, suspect that her restrictive lung defect is due to the very large diaphragmatic hernia. Nevertheless she does not need to take this medication at this point. It is possible that atrial fibrillation will recur again during periods of physiological stress such as surgery. Continue to monitor the her pacemaker and continue anticoagulation 2. HTN: Satisfactory  control 3. Anticoagulation: No bleeding complications. Well-tolerated. 4. SSS: Satisfactory sensor settings and heart rate histogram considering her inactive lifestyle. 5. PM: Normal pacemaker function. The patient prefers in-office evaluation. We'll schedule in 6 months 6. HLP: On statin 7. Restrictive ventilatory defect: At least by previous chest x-ray no sign of amiodarone related lung disease. Her CT shows large amounts of herniated abdominal viscera in her chest and this probably explains the restrictive defect .    Medication Adjustments/Labs and Tests Ordered: Current medicines are reviewed at length with the patient today.  Concerns regarding medicines are outlined above.  Medication changes, Labs and Tests ordered today are listed in the Patient Instructions below. Patient Instructions  Dr Sallyanne Kuster recommends that you schedule a follow-up appointment in 6 months. You will receive a reminder letter in the mail two months in advance. If you don't receive a letter, please call our office to schedule the follow-up appointment.  If you need a refill on your cardiac medications before your next appointment, please call your pharmacy.      Signed, Sanda Klein, MD  05/24/2017 6:12 PM    [facility] [place], [place],   [address] Phone: [phone]; Fax: [phone]

## 2017-05-24 NOTE — Patient Instructions (Signed)
Dr Croitoru recommends that you schedule a follow-up appointment in 6 months. You will receive a reminder letter in the mail two months in advance. If you don't receive a letter, please call our office to schedule the follow-up appointment.  If you need a refill on your cardiac medications before your next appointment, please call your pharmacy. 

## 2017-06-15 ENCOUNTER — Ambulatory Visit (INDEPENDENT_AMBULATORY_CARE_PROVIDER_SITE_OTHER): Payer: Medicare Other | Admitting: Pharmacist Clinician (PhC)/ Clinical Pharmacy Specialist

## 2017-06-15 DIAGNOSIS — I480 Paroxysmal atrial fibrillation: Secondary | ICD-10-CM

## 2017-06-15 DIAGNOSIS — Z7901 Long term (current) use of anticoagulants: Secondary | ICD-10-CM

## 2017-06-15 LAB — POCT INR: INR: 2.2

## 2017-07-07 ENCOUNTER — Other Ambulatory Visit: Payer: Self-pay | Admitting: Gastroenterology

## 2017-08-03 ENCOUNTER — Ambulatory Visit (INDEPENDENT_AMBULATORY_CARE_PROVIDER_SITE_OTHER): Payer: Medicare Other | Admitting: Pharmacist

## 2017-08-03 DIAGNOSIS — Z7901 Long term (current) use of anticoagulants: Secondary | ICD-10-CM | POA: Diagnosis not present

## 2017-08-03 DIAGNOSIS — I480 Paroxysmal atrial fibrillation: Secondary | ICD-10-CM | POA: Diagnosis not present

## 2017-08-03 LAB — POCT INR: INR: 2.2

## 2017-09-14 ENCOUNTER — Ambulatory Visit (INDEPENDENT_AMBULATORY_CARE_PROVIDER_SITE_OTHER): Payer: Medicare Other | Admitting: Pharmacist Clinician (PhC)/ Clinical Pharmacy Specialist

## 2017-09-14 DIAGNOSIS — I480 Paroxysmal atrial fibrillation: Secondary | ICD-10-CM | POA: Diagnosis not present

## 2017-09-14 DIAGNOSIS — Z7901 Long term (current) use of anticoagulants: Secondary | ICD-10-CM

## 2017-09-14 LAB — POCT INR: INR: 3.4

## 2017-09-14 MED ORDER — DEXLANSOPRAZOLE 60 MG PO CPDR: 60.0000 mg | DELAYED_RELEASE_CAPSULE | Freq: Every day | ORAL | 0 refills | Status: DC

## 2017-10-03 ENCOUNTER — Other Ambulatory Visit: Payer: Self-pay | Admitting: *Deleted

## 2017-10-12 ENCOUNTER — Ambulatory Visit (INDEPENDENT_AMBULATORY_CARE_PROVIDER_SITE_OTHER): Payer: Medicare Other | Admitting: Pharmacist Clinician (PhC)/ Clinical Pharmacy Specialist

## 2017-10-12 DIAGNOSIS — Z7901 Long term (current) use of anticoagulants: Secondary | ICD-10-CM | POA: Diagnosis not present

## 2017-10-12 DIAGNOSIS — I480 Paroxysmal atrial fibrillation: Secondary | ICD-10-CM | POA: Diagnosis not present

## 2017-10-12 LAB — POCT INR: INR: 2.5

## 2017-12-07 ENCOUNTER — Ambulatory Visit (INDEPENDENT_AMBULATORY_CARE_PROVIDER_SITE_OTHER): Payer: Medicare Other | Admitting: Pharmacist

## 2017-12-07 DIAGNOSIS — I480 Paroxysmal atrial fibrillation: Secondary | ICD-10-CM

## 2017-12-07 DIAGNOSIS — Z7901 Long term (current) use of anticoagulants: Secondary | ICD-10-CM

## 2017-12-07 LAB — POCT INR: INR: 2.3

## 2017-12-26 ENCOUNTER — Encounter: Payer: Self-pay | Admitting: Neurology

## 2018-01-03 ENCOUNTER — Other Ambulatory Visit: Payer: Self-pay | Admitting: Family Medicine

## 2018-01-03 DIAGNOSIS — M545 Low back pain: Principal | ICD-10-CM

## 2018-01-03 DIAGNOSIS — G8929 Other chronic pain: Secondary | ICD-10-CM

## 2018-01-03 LAB — CUP PACEART INCLINIC DEVICE CHECK
Battery Remaining Longevity: 110 mo
Brady Statistic AS VS Percent: 1 %
Date Time Interrogation Session: 20180712183538
Implantable Lead Implant Date: 20120816
Implantable Lead Implant Date: 20120816
Implantable Pulse Generator Implant Date: 20120816
Lead Channel Impedance Value: 486 Ohm
Lead Channel Impedance Value: 544 Ohm
Lead Channel Pacing Threshold Amplitude: 0.625 V
Lead Channel Pacing Threshold Pulse Width: 0.4 ms
Lead Channel Setting Pacing Amplitude: 1.5 V
Lead Channel Setting Sensing Sensitivity: 4 mV
MDC IDC LEAD LOCATION: 753859
MDC IDC LEAD LOCATION: 753860
MDC IDC MSMT BATTERY IMPEDANCE: 355 Ohm
MDC IDC MSMT BATTERY VOLTAGE: 2.78 V
MDC IDC MSMT LEADCHNL RA PACING THRESHOLD AMPLITUDE: 0.5 V
MDC IDC MSMT LEADCHNL RA PACING THRESHOLD PULSEWIDTH: 0.4 ms
MDC IDC SET LEADCHNL RV PACING AMPLITUDE: 2 V
MDC IDC SET LEADCHNL RV PACING PULSEWIDTH: 0.4 ms
MDC IDC STAT BRADY AP VP PERCENT: 0 %
MDC IDC STAT BRADY AP VS PERCENT: 98 %
MDC IDC STAT BRADY AS VP PERCENT: 0 %

## 2018-01-04 ENCOUNTER — Telehealth: Payer: Self-pay

## 2018-01-04 NOTE — Telephone Encounter (Signed)
Patient with diagnosis of atrial fibrillation on warfarin for anticoagulation.    Procedure: lumbar epidural injections (x3) Date of procedure: TBD  CHADS2-VASc score of  4 (HTN, AGE x 2, female)  CrCl 26.8 Platelet count 228  Per office protocol, patient can hold warfarin for 5 days prior to procedure.   Patient will not need bridging with Lovenox (enoxaparin) around procedure.  If not bridging, patient should restart warfarin on the evening of procedure or day after, at discretion of procedure MD

## 2018-01-04 NOTE — Telephone Encounter (Signed)
   [facility] Pre-operative Risk Assessment    Request for surgical clearance:  1. What type of surgery is being performed? 3 LUMBAR EPIDURAL STEROID INJECTIONS-OVER THE NEXT 6 MONTHS   2. When is this surgery scheduled? TBD   3. What type of clearance is required (medical clearance vs. Pharmacy clearance to hold med vs. Both)? BOTH  4. Are there any medications that need to be held prior to surgery and how long?WARFARIN -4 DAYS BEFORE INJECTION   5. Practice name and name of physician performing surgery? [facility]   6. What is your office phone and fax number? [phone]  FX [phone]   7. Anesthesia type (None, local, MAC, general) ? NOT LISTED   Waylan Rocher 01/04/2018, 1:41 PM  _________________________________________________________________   (provider comments below)

## 2018-01-07 NOTE — Telephone Encounter (Signed)
   Primary Cardiologist: Croitoru Chart reviewed as part of pre-operative protocol coverage. Because of Anne Anthony's past medical history and time since last visit, he/she will require a follow-up visit in order to better assess preoperative cardiovascular risk.  Pre-op covering staff: - Please schedule appointment and call patient to inform them. - Please contact requesting surgeon's office via preferred method (i.e, phone, fax) to inform them of need for appointment prior to surgery.  Truitt Merle, NP  01/07/2018, 1:20 PM

## 2018-01-07 NOTE — Telephone Encounter (Signed)
S/w pt is aware of appt made for Friday, February 25 for pre-op clearance.

## 2018-01-09 ENCOUNTER — Ambulatory Visit: Payer: Medicare Other | Admitting: Neurology

## 2018-01-11 ENCOUNTER — Encounter: Payer: Self-pay | Admitting: Physician Assistant

## 2018-01-11 ENCOUNTER — Ambulatory Visit: Payer: Medicare Other | Admitting: Physician Assistant

## 2018-01-11 VITALS — BP 113/72 | HR 72 | Ht 64.0 in | Wt 120.0 lb

## 2018-01-11 DIAGNOSIS — E785 Hyperlipidemia, unspecified: Secondary | ICD-10-CM | POA: Diagnosis not present

## 2018-01-11 DIAGNOSIS — I480 Paroxysmal atrial fibrillation: Secondary | ICD-10-CM | POA: Diagnosis not present

## 2018-01-11 DIAGNOSIS — I4892 Unspecified atrial flutter: Secondary | ICD-10-CM | POA: Diagnosis not present

## 2018-01-11 DIAGNOSIS — Z950 Presence of cardiac pacemaker: Secondary | ICD-10-CM

## 2018-01-11 DIAGNOSIS — Z01810 Encounter for preprocedural cardiovascular examination: Secondary | ICD-10-CM

## 2018-01-11 DIAGNOSIS — I10 Essential (primary) hypertension: Secondary | ICD-10-CM | POA: Diagnosis not present

## 2018-01-11 NOTE — Patient Instructions (Signed)
Medication Instructions: Your physician recommends that you continue on your current medications as directed. Please refer to the Current Medication list given to you today.  If you need a refill on your cardiac medications before your next appointment, please call your pharmacy.    Follow-Up: Your physician wants you to follow-up in 6 months with Dr. Sallyanne Kuster. You will receive a reminder letter in the mail two months in advance. If you don't receive a letter, please call our office at [phone] to schedule this follow-up appointment.   Thank you for choosing Heartcare at [facility]!!

## 2018-01-11 NOTE — Telephone Encounter (Signed)
[facility] called and was informed that the patient should hold warfarin 5 days prior. The patient was seen today and has been made aware. [facility] has Epic and stated that they were aware of the clearance and guidelines.

## 2018-01-11 NOTE — Progress Notes (Signed)
Cardiology Office Note    Date:  01/12/2018   ID:  Anne Anthony, DOB [birth year], MRN [id number]  PCP:  Sharilyn Sites, MD  Cardiologist:  Dr. Sallyanne Kuster   Chief Complaint  Patient presents with  . Follow-up    preop clearance for back injection  . Pre-op Exam    History of Present Illness:  Anne Anthony is a 82 y.o. female with PMH of atrial flutter s/p ablation in 2007, PAF, HTN, HLD, severe protein calorie malnutrition and tachybradycardia syndrome s/p MDT pacemaker.  She was previously on amiodarone, this was discontinued due to worsening lung issue.  He is largely dependent on atrial pacing.  Previous interrogation showed a 98.6% atrial pacing in the less than 0.3% ventricular pacing.  Although she had atrial flutter ablation in the past, she later developed atrial fibrillation.  Despite the discontinuation of amiodarone, she only has brief episodes of atrial fibrillation.  Although the previous pulmonary function test in 2017 showed a restrictive lung defect, however suspicion for amiodarone induced a lung issue is somewhat low, it was felt that the restrictive lung defect is due to the very large diaphragmatic hernia.  Patient presents today for preoperative clearance prior to steroid injection for her sciatica.  She says her sciatica is worse on the left side with pain radiating down to the toes.  She does not have any prior history of coronary artery disease and has not noticed any recent exertional chest pain or shortness of breath.  Given the low risk nature of the procedure, she is cleared to proceed. She will need to hold her coumadin for 5 days prior to the injection. She is due for device interrogation, we will interrogate her ICD today.  I will delay her appointment with Dr. Sallyanne Kuster from May to September.  Otherwise she has been doing well without any lower extremity edema, orthopnea or PND.  She does have decreased breath sounds in the left base consistent with her prior  history of large diaphragmatic hernia.   Past Medical History:  Diagnosis Date  . Atrial flutter ([place])    successful radiofrequency ablation 2007  . Diverticula of colon   . Family history of colon cancer    brother  . Hiatal hernia   . Hyperlipidemia   . Hypertension   . Pacemaker   . Paroxysmal atrial fibrillation (HCC)   . Severe protein-calorie malnutrition ([place]) 11/17/2014  . Tachycardia-bradycardia syndrome ([place])    permament pacermaker, dual chamber Medtronic on 06/2011  . Tubular adenoma     Past Surgical History:  Procedure Laterality Date  . ABDOMINAL SURGERY    . COLONOSCOPY  08/17/2010   Dr. Gala Romney- internal hemorrhoids, o/w normal rectum, L sided diverticula, tubular adenoma  . ESOPHAGOGASTRODUODENOSCOPY  10/502011   Dr. Gala Romney- huge diaphragmatic/hiatial hernia, fundal gland polyps not manipulated o/w noral appearing gastric mucosa, patent pylorus, normal D1 & D2  . HIATAL HERNIA REPAIR  2005  . HIP ARTHROPLASTY Left 11/16/2014   Procedure: ARTHROPLASTY BIPOLAR HIP;  Surgeon: Mauri Pole, MD;  Location: [facility];  Service: Orthopedics;  Laterality: Left;  . PARTIAL HYSTERECTOMY    . permament pacemaker  06/2011   dual chamber , Medtronic Adapta serial # [id number]  last checked 02/05/2013  . RADIOFREQUENCY ABLATION  06/28/2006    Current Medications: Outpatient Medications Prior to Visit  Medication Sig Dispense Refill  . acetaminophen (TYLENOL) 325 MG tablet Take 1-2 tablets (325-650 mg total) by mouth every 4 (four) hours as needed  for fever, headache or mild pain.    Marland Kitchen dexlansoprazole (DEXILANT) 60 MG capsule Take 1 capsule (60 mg total) by mouth daily. 20 capsule 0  . gabapentin (NEURONTIN) 300 MG capsule 1 cap at bedtime 90 capsule 0  . HYDROcodone-acetaminophen (NORCO/VICODIN) 5-325 MG tablet Take 1 tablet by mouth every 6 (six) hours as needed for moderate pain (take 1 tablet every 4-6 hours as needed for pain).    . meclizine (ANTIVERT) 25 MG tablet Take 25  mg by mouth 2 (two) times daily.    . metoprolol tartrate (LOPRESSOR) 25 MG tablet Take 25 mg by mouth 2 (two) times daily.    . simvastatin (ZOCOR) 20 MG tablet Take 20 mg by mouth every evening.    . trimethoprim (TRIMPEX) 100 MG tablet Take 100 mg by mouth daily.    Marland Kitchen warfarin (COUMADIN) 2 MG tablet Take 1 to 1.5 tablets by mouth daily as directed by coumadin clinic 120 tablet 1  . warfarin (COUMADIN) 2 MG tablet TAKE 1 TO 1.5 TABLETS BY MOUTH DAILY AS DIRECTED BY COUMADIN CLINIC 120 tablet 1  . ipratropium (ATROVENT) 0.03 % nasal spray Place 2 sprays into both nostrils every 12 (twelve) hours. (Patient not taking: Reported on 01/11/2018) 30 mL 3  . zolpidem (AMBIEN) 5 MG tablet Take one tablet by mouth at bedtime as needed for sleep (Patient not taking: Reported on 01/11/2018) 30 tablet 5   No facility-administered medications prior to visit.      Allergies:   Morphine and related   Social History   Socioeconomic History  . Marital status: Widowed    Spouse name: None  . Number of children: 4  . Years of education: None  . Highest education level: None  Social Needs  . Financial resource strain: None  . Food insecurity - worry: None  . Food insecurity - inability: None  . Transportation needs - medical: None  . Transportation needs - non-medical: None  Occupational History  . None  Tobacco Use  . Smoking status: Never Smoker  . Smokeless tobacco: Never Used  Substance and Sexual Activity  . Alcohol use: No  . Drug use: No  . Sexual activity: None  Other Topics Concern  . None  Social History Narrative  . None     Family History:  The patient's family history includes Diabetes in her mother.   ROS:   Please see the history of present illness.    ROS All other systems reviewed and are negative.   PHYSICAL EXAM:   VS:  BP 113/72   Pulse 72   Ht 5\' 4"  (1.626 m)   Wt 120 lb (54.4 kg)   BMI 20.60 kg/m    GEN: Well nourished, well developed, in no acute distress    HEENT: normal  Neck: no JVD, carotid bruits, or masses Cardiac: RRR; no murmurs, rubs, or gallops,no edema  Respiratory:  clear to auscultation bilaterally, except for diminished breath in the left base GI: soft, nontender, nondistended, + BS MS: no deformity or atrophy  Skin: warm and dry, no rash Neuro:  Alert and Oriented x 3, Strength and sensation are intact Psych: euthymic mood, full affect  Wt Readings from Last 3 Encounters:  01/11/18 120 lb (54.4 kg)  05/24/17 124 lb (56.2 kg)  08/09/16 124 lb 12.8 oz (56.6 kg)      Studies/Labs Reviewed:   EKG:  EKG is ordered today.  The ekg ordered today demonstrates atrial paced rhythm  Recent Labs:  No results found for requested labs within last 8760 hours.   Lipid Panel No results found for: CHOL, TRIG, HDL, CHOLHDL, VLDL, LDLCALC, LDLDIRECT  Additional studies/ records that were reviewed today include:   Echo 06/23/2014 LV EF: 50% -  55%  Study Conclusions  - Left ventricle: The cavity size was normal. Wall thickness was normal. Systolic function was normal. The estimated ejection fraction was in the range of 50% to 55%. Wall motion was normal; there were no regional wall motion abnormalities. Left ventricular diastolic function parameters were normal. - Left atrium: The atrium was mildly dilated. - Pericardium, extracardiac: A trivial pericardial effusion was identified.  Impressions:  - Low normal LV function; mild LAE; trivial pericardial effusion; there appears to be compression of LV from increased intraabdominal pressure. Compared to 08/05/13, no significant change.   ASSESSMENT:    1. Preop cardiovascular exam   2. Essential hypertension   3. Atrial flutter, unspecified type ([place])   4. PAF (paroxysmal atrial fibrillation) ([place])   5. Hyperlipidemia, unspecified hyperlipidemia type   6. Pacemaker      PLAN:  In order of problems listed above:  1. Preoperative clearance: Pending  back injection by [facility] imaging.  Given low risk nature of the procedure and lack of symptom, patient is cleared without further workup.  She will need to hold her Coumadin for up to 5 days prior to the procedure, this will be further managed by Coumadin clinic next Monday.  2. Paroxysmal atrial fibrillation on Coumadin: Monitored by Coumadin clinic.  Continue on metoprolol.  3. Hyperlipidemia: On Zocor 20 mg daily.  Lipid panel has been monitored by primary care provider.  4. S/p PPM: We will obtain interrogation today.    Medication Adjustments/Labs and Tests Ordered: Current medicines are reviewed at length with the patient today.  Concerns regarding medicines are outlined above.  Medication changes, Labs and Tests ordered today are listed in the Patient Instructions below. Patient Instructions  Medication Instructions: Your physician recommends that you continue on your current medications as directed. Please refer to the Current Medication list given to you today.  If you need a refill on your cardiac medications before your next appointment, please call your pharmacy.    Follow-Up: Your physician wants you to follow-up in 6 months with Dr. Sallyanne Kuster. You will receive a reminder letter in the mail two months in advance. If you don't receive a letter, please call our office at [phone] to schedule this follow-up appointment.   Thank you for choosing [facility] at [facility], [place], [place]  01/12/2018 12:41 PM    [address] [address], [place], [place]  [address] Phone: [phone]; Fax: [phone]

## 2018-01-11 NOTE — Telephone Encounter (Signed)
I have seen the patient in the clinic.  She denied any chest pain or shortness of breath.  My office note to follow.  Given the low risk nature of his procedure, she may proceed with the lumbar epidural injection. She has been instructed to hold Coumadin for 5 days prior to the procedure.  Our clinical pharmacist in the Coumadin clinic will see her back next Monday which is the day prior to the injection to make sure her INR is safe to go through with the procedure.  Hilbert Corrigan PA Pager: [phone]

## 2018-01-11 NOTE — Telephone Encounter (Signed)
Left a message with [facility] to call back pertaining to the clearance.

## 2018-01-12 ENCOUNTER — Encounter: Payer: Self-pay | Admitting: Physician Assistant

## 2018-01-13 NOTE — Progress Notes (Signed)
Agree MCr 

## 2018-01-14 ENCOUNTER — Ambulatory Visit (INDEPENDENT_AMBULATORY_CARE_PROVIDER_SITE_OTHER): Payer: Medicare Other | Admitting: Pharmacist

## 2018-01-14 DIAGNOSIS — I480 Paroxysmal atrial fibrillation: Secondary | ICD-10-CM

## 2018-01-14 DIAGNOSIS — Z7901 Long term (current) use of anticoagulants: Secondary | ICD-10-CM

## 2018-01-14 LAB — POCT INR: INR: 1.7

## 2018-01-14 NOTE — Patient Instructions (Signed)
Description   Resume tomorrow with  1 tablet daily except 1.5 tablets each Monday and Friday, repeat INR in 6 weeks.

## 2018-01-15 ENCOUNTER — Ambulatory Visit
Admission: RE | Admit: 2018-01-15 | Discharge: 2018-01-15 | Disposition: A | Discharge status: Home / Self Care | Payer: Medicare Other | Source: Ambulatory Visit | Attending: Family Medicine | Admitting: Family Medicine

## 2018-01-15 ENCOUNTER — Ambulatory Visit (INDEPENDENT_AMBULATORY_CARE_PROVIDER_SITE_OTHER): Payer: Medicare Other | Admitting: Pharmacist

## 2018-01-15 DIAGNOSIS — I480 Paroxysmal atrial fibrillation: Secondary | ICD-10-CM

## 2018-01-15 DIAGNOSIS — M545 Low back pain: Principal | ICD-10-CM

## 2018-01-15 DIAGNOSIS — G8929 Other chronic pain: Secondary | ICD-10-CM

## 2018-01-15 DIAGNOSIS — Z7901 Long term (current) use of anticoagulants: Secondary | ICD-10-CM

## 2018-01-15 LAB — POCT INR: INR: 1.3

## 2018-01-15 MED ORDER — IOPAMIDOL (ISOVUE-M 200) INJECTION 41%
1.0000 mL | Freq: Once | INTRAMUSCULAR | Status: AC
  Administered 2018-01-15: 1 mL via EPIDURAL

## 2018-01-15 MED ORDER — METHYLPREDNISOLONE ACETATE 40 MG/ML INJ SUSP (RADIOLOG
120.0000 mg | Freq: Once | INTRAMUSCULAR | Status: AC
  Administered 2018-01-15: 120 mg via EPIDURAL

## 2018-01-15 NOTE — Discharge Instructions (Signed)
Post Procedure Spinal Discharge Instruction Sheet  1. You may resume a regular diet and any medications that you routinely take (including pain medications).  2. No driving day of procedure.  3. Light activity throughout the rest of the day.  Do not do any strenuous work, exercise, bending or lifting.  The day following the procedure, you can resume normal physical activity but you should refrain from exercising or physical therapy for at least three days thereafter.   Common Side Effects:   Headaches- take your usual medications as directed by your physician.  Increase your fluid intake.  Caffeinated beverages may be helpful.  Lie flat in bed until your headache resolves.   Restlessness or inability to sleep- you may have trouble sleeping for the next few days.  Ask your referring physician if you need any medication for sleep.   Facial flushing or redness- should subside within a few days.   Increased pain- a temporary increase in pain a day or two following your procedure is not unusual.  Take your pain medication as prescribed by your referring physician.   Leg cramps  Please contact our office at [phone] for the following symptoms:  Fever greater than 100 degrees.  Headaches unresolved with medication after 2-3 days.  Increased swelling, pain, or redness at injection site.  Myelogram Discharge Instructions  2. Go home and rest quietly for the next 24 hours.  It is important to lie flat for the next 24 hours.  Get up only to go to the restroom.  You may lie in the bed or on a couch on your back, your stomach, your left side or your right side.  You may have one pillow under your head.  You may have pillows between your knees while you are on your side or under your knees while you are on your back.  3. DO NOT drive today.  Recline the seat as far back as it will go, while still wearing your seat belt, on the way home.  4. You may get up to go to the bathroom as  needed.  You may sit up for 10 minutes to eat.  You may resume your normal diet and medications unless otherwise indicated.  Drink lots of extra fluids today and tomorrow.  5. The incidence of headache, nausea, or vomiting is about 5% (one in 20 patients).  If you develop a headache, lie flat and drink plenty of fluids until the headache goes away.  Caffeinated beverages may be helpful.  If you develop severe nausea and vomiting or a headache that does not go away with flat bed rest, call [phone].  6. You may resume normal activities after your 24 hours of bed rest is over; however, do not exert yourself strongly or do any heavy lifting tomorrow. If when you get up you have a headache when standing, go back to bed and force fluids for another 24 hours.  7. Call your physician for a follow-up appointment.  The results of your myelogram will be sent directly to your physician by the following day.  8. If you have any questions or if complications develop after you arrive home, please call [phone].  Discharge instructions have been explained to the patient.  The patient, or the person responsible for the patient, fully understands these instructions.  YOU MAY RESTART YOUR WARFARIN TODAY.

## 2018-01-18 ENCOUNTER — Other Ambulatory Visit: Payer: Self-pay | Admitting: Cardiovascular Disease

## 2018-01-23 LAB — CUP PACEART INCLINIC DEVICE CHECK
Battery Remaining Longevity: 101 mo
Brady Statistic AP VS Percent: 98 %
Brady Statistic AS VS Percent: 1 %
Date Time Interrogation Session: 20190301153306
Implantable Lead Implant Date: 20120816
Implantable Lead Implant Date: 20120816
Implantable Lead Location: 753859
Implantable Lead Location: 753860
Implantable Pulse Generator Implant Date: 20120816
Lead Channel Impedance Value: 500 Ohm
Lead Channel Impedance Value: 627 Ohm
Lead Channel Pacing Threshold Amplitude: 0.625 V
Lead Channel Pacing Threshold Pulse Width: 0.4 ms
Lead Channel Setting Pacing Amplitude: 1.5 V
Lead Channel Setting Sensing Sensitivity: 5.6 mV
MDC IDC MSMT BATTERY IMPEDANCE: 453 Ohm
MDC IDC MSMT BATTERY VOLTAGE: 2.78 V
MDC IDC MSMT LEADCHNL RA PACING THRESHOLD AMPLITUDE: 0.625 V
MDC IDC MSMT LEADCHNL RA PACING THRESHOLD PULSEWIDTH: 0.4 ms
MDC IDC SET LEADCHNL RV PACING AMPLITUDE: 2 V
MDC IDC SET LEADCHNL RV PACING PULSEWIDTH: 0.4 ms
MDC IDC STAT BRADY AP VP PERCENT: 0 %
MDC IDC STAT BRADY AS VP PERCENT: 0 %

## 2018-01-31 ENCOUNTER — Observation Stay (HOSPITAL_COMMUNITY)
Admission: EM | Admit: 2018-01-31 | Discharge: 2018-02-01 | Disposition: A | Discharge status: Home / Self Care | Payer: Medicare Other | Attending: Family Medicine | Admitting: Family Medicine

## 2018-01-31 ENCOUNTER — Emergency Department (HOSPITAL_COMMUNITY): Payer: Medicare Other

## 2018-01-31 ENCOUNTER — Inpatient Hospital Stay (HOSPITAL_COMMUNITY): Payer: Medicare Other

## 2018-01-31 ENCOUNTER — Encounter (HOSPITAL_COMMUNITY): Payer: Self-pay | Admitting: Emergency Medicine

## 2018-01-31 ENCOUNTER — Other Ambulatory Visit: Payer: Self-pay

## 2018-01-31 DIAGNOSIS — R791 Abnormal coagulation profile: Secondary | ICD-10-CM | POA: Insufficient documentation

## 2018-01-31 DIAGNOSIS — S5012XA Contusion of left forearm, initial encounter: Secondary | ICD-10-CM | POA: Insufficient documentation

## 2018-01-31 DIAGNOSIS — Z79899 Other long term (current) drug therapy: Secondary | ICD-10-CM | POA: Insufficient documentation

## 2018-01-31 DIAGNOSIS — Z5181 Encounter for therapeutic drug level monitoring: Secondary | ICD-10-CM

## 2018-01-31 DIAGNOSIS — Y939 Activity, unspecified: Secondary | ICD-10-CM | POA: Insufficient documentation

## 2018-01-31 DIAGNOSIS — W19XXXA Unspecified fall, initial encounter: Secondary | ICD-10-CM

## 2018-01-31 DIAGNOSIS — W0110XA Fall on same level from slipping, tripping and stumbling with subsequent striking against unspecified object, initial encounter: Secondary | ICD-10-CM | POA: Insufficient documentation

## 2018-01-31 DIAGNOSIS — S8002XA Contusion of left knee, initial encounter: Secondary | ICD-10-CM | POA: Diagnosis not present

## 2018-01-31 DIAGNOSIS — Z7901 Long term (current) use of anticoagulants: Secondary | ICD-10-CM | POA: Insufficient documentation

## 2018-01-31 DIAGNOSIS — Y92009 Unspecified place in unspecified non-institutional (private) residence as the place of occurrence of the external cause: Secondary | ICD-10-CM | POA: Diagnosis not present

## 2018-01-31 DIAGNOSIS — Y999 Unspecified external cause status: Secondary | ICD-10-CM | POA: Insufficient documentation

## 2018-01-31 DIAGNOSIS — R0989 Other specified symptoms and signs involving the circulatory and respiratory systems: Secondary | ICD-10-CM | POA: Diagnosis not present

## 2018-01-31 DIAGNOSIS — S066X0A Traumatic subarachnoid hemorrhage without loss of consciousness, initial encounter: Secondary | ICD-10-CM | POA: Diagnosis not present

## 2018-01-31 DIAGNOSIS — I10 Essential (primary) hypertension: Secondary | ICD-10-CM | POA: Insufficient documentation

## 2018-01-31 DIAGNOSIS — I480 Paroxysmal atrial fibrillation: Secondary | ICD-10-CM | POA: Insufficient documentation

## 2018-01-31 DIAGNOSIS — Z950 Presence of cardiac pacemaker: Secondary | ICD-10-CM | POA: Diagnosis not present

## 2018-01-31 DIAGNOSIS — S0083XA Contusion of other part of head, initial encounter: Secondary | ICD-10-CM | POA: Insufficient documentation

## 2018-01-31 DIAGNOSIS — I609 Nontraumatic subarachnoid hemorrhage, unspecified: Secondary | ICD-10-CM

## 2018-01-31 DIAGNOSIS — S098XXA Other specified injuries of head, initial encounter: Secondary | ICD-10-CM | POA: Diagnosis present

## 2018-01-31 DIAGNOSIS — J90 Pleural effusion, not elsewhere classified: Secondary | ICD-10-CM | POA: Diagnosis present

## 2018-01-31 LAB — BASIC METABOLIC PANEL
ANION GAP: 9 (ref 5–15)
BUN: 17 mg/dL (ref 6–20)
CHLORIDE: 105 mmol/L (ref 101–111)
CO2: 25 mmol/L (ref 22–32)
Calcium: 8.9 mg/dL (ref 8.9–10.3)
Creatinine, Ser: 1 mg/dL (ref 0.44–1.00)
GFR, EST NON AFRICAN AMERICAN: 52 mL/min — AB (ref >60)
Glucose, Bld: 136 mg/dL — ABNORMAL HIGH (ref 65–99)
POTASSIUM: 4.3 mmol/L (ref 3.5–5.1)
SODIUM: 139 mmol/L (ref 135–145)

## 2018-01-31 LAB — CBC WITH DIFFERENTIAL/PLATELET
Basophils Absolute: 0 10*3/uL (ref 0.0–0.1)
Basophils Relative: 0 %
Eosinophils Absolute: 0.2 10*3/uL (ref 0.0–0.7)
Eosinophils Relative: 2 %
HCT: 42.5 % (ref 36.0–46.0)
HEMOGLOBIN: 13.6 g/dL (ref 12.0–15.0)
LYMPHS ABS: 1.6 10*3/uL (ref 0.7–4.0)
LYMPHS PCT: 16 %
MCH: 34.4 pg — AB (ref 26.0–34.0)
MCHC: 32 g/dL (ref 30.0–36.0)
MCV: 107.6 fL — AB (ref 78.0–100.0)
Monocytes Absolute: 1 10*3/uL (ref 0.1–1.0)
Monocytes Relative: 9 %
NEUTROS ABS: 7.4 10*3/uL (ref 1.7–7.7)
NEUTROS PCT: 73 %
PLATELETS: 187 10*3/uL (ref 150–400)
RBC: 3.95 MIL/uL (ref 3.87–5.11)
RDW: 13.4 % (ref 11.5–15.5)
WBC: 10.2 10*3/uL (ref 4.0–10.5)

## 2018-01-31 LAB — PROTIME-INR
INR: 5.1
INR: 1.05
INR: 1.02
PROTHROMBIN TIME: 46.8 s — AB (ref 11.4–15.2)
PROTHROMBIN TIME: 13.3 s (ref 11.4–15.2)
Prothrombin Time: 13.6 seconds (ref 11.4–15.2)

## 2018-01-31 LAB — TYPE AND SCREEN
ABO/RH(D): O POS
Antibody Screen: NEGATIVE

## 2018-01-31 MED ORDER — PROTHROMBIN COMPLEX CONC HUMAN 500 UNITS IV KIT
1584.0000 [IU] | PACK | Status: AC
  Administered 2018-01-31: 1584 [IU] via INTRAVENOUS
  Filled 2018-01-31 (×2): qty 1584

## 2018-01-31 MED ORDER — HYDROCODONE-ACETAMINOPHEN 5-325 MG PO TABS
1.0000 | ORAL_TABLET | Freq: Four times a day (QID) | ORAL | Status: DC | PRN
  Administered 2018-01-31: 1 via ORAL
  Filled 2018-01-31: qty 1

## 2018-01-31 MED ORDER — METOPROLOL TARTRATE 25 MG PO TABS
25.0000 mg | ORAL_TABLET | Freq: Two times a day (BID) | ORAL | Status: DC
  Administered 2018-01-31 – 2018-02-01 (×3): 25 mg via ORAL
  Filled 2018-01-31 (×3): qty 1

## 2018-01-31 MED ORDER — SODIUM CHLORIDE 0.9 % IV SOLN
INTRAVENOUS | Status: DC
  Administered 2018-01-31: 07:00:00 via INTRAVENOUS

## 2018-01-31 MED ORDER — HYDRALAZINE HCL 20 MG/ML IJ SOLN
5.0000 mg | Freq: Four times a day (QID) | INTRAMUSCULAR | Status: DC | PRN
  Filled 2018-01-31: qty 0.25

## 2018-01-31 MED ORDER — ACETAMINOPHEN 325 MG PO TABS: 325.0000 mg | ORAL_TABLET | ORAL | Status: DC | PRN

## 2018-01-31 MED ORDER — ONDANSETRON HCL 4 MG PO TABS: 4.0000 mg | ORAL_TABLET | Freq: Four times a day (QID) | ORAL | Status: DC | PRN

## 2018-01-31 MED ORDER — HYDROMORPHONE HCL 1 MG/ML IJ SOLN
0.2500 mg | Freq: Once | INTRAMUSCULAR | Status: AC
  Administered 2018-01-31: 0.25 mg via INTRAVENOUS
  Filled 2018-01-31: qty 1

## 2018-01-31 MED ORDER — ACETAMINOPHEN 325 MG PO TABS
650.0000 mg | ORAL_TABLET | Freq: Four times a day (QID) | ORAL | Status: DC | PRN
  Administered 2018-02-01: 650 mg via ORAL
  Filled 2018-01-31: qty 2

## 2018-01-31 MED ORDER — TRIMETHOPRIM 100 MG PO TABS
100.0000 mg | ORAL_TABLET | Freq: Every day | ORAL | Status: DC
  Administered 2018-01-31: 100 mg via ORAL
  Filled 2018-01-31 (×3): qty 1

## 2018-01-31 MED ORDER — PANTOPRAZOLE SODIUM 40 MG PO TBEC
80.0000 mg | DELAYED_RELEASE_TABLET | Freq: Every day | ORAL | Status: DC
  Administered 2018-01-31 – 2018-02-01 (×2): 80 mg via ORAL
  Filled 2018-01-31 (×2): qty 2

## 2018-01-31 MED ORDER — GABAPENTIN 300 MG PO CAPS
300.0000 mg | ORAL_CAPSULE | Freq: Two times a day (BID) | ORAL | Status: DC
  Administered 2018-01-31 – 2018-02-01 (×3): 300 mg via ORAL
  Filled 2018-01-31 (×3): qty 1

## 2018-01-31 MED ORDER — ONDANSETRON HCL 4 MG/2ML IJ SOLN
4.0000 mg | Freq: Four times a day (QID) | INTRAMUSCULAR | Status: DC | PRN
  Administered 2018-02-01: 4 mg via INTRAVENOUS
  Filled 2018-01-31: qty 2

## 2018-01-31 MED ORDER — STROKE: EARLY STAGES OF RECOVERY BOOK
Freq: Once | Status: DC
  Filled 2018-01-31: qty 1

## 2018-01-31 MED ORDER — HYDROCODONE-ACETAMINOPHEN 5-325 MG PO TABS
1.0000 | ORAL_TABLET | Freq: Four times a day (QID) | ORAL | Status: DC | PRN
  Administered 2018-01-31 – 2018-02-01 (×2): 1 via ORAL
  Filled 2018-01-31 (×2): qty 1

## 2018-01-31 MED ORDER — HYDROCODONE-ACETAMINOPHEN 5-325 MG PO TABS
1.0000 | ORAL_TABLET | Freq: Four times a day (QID) | ORAL | Status: DC | PRN
Start: 2018-01-31 — End: 2018-01-31

## 2018-01-31 MED ORDER — SIMVASTATIN 20 MG PO TABS
20.0000 mg | ORAL_TABLET | Freq: Every evening | ORAL | Status: DC
  Filled 2018-01-31: qty 1

## 2018-01-31 MED ORDER — VITAMIN K1 10 MG/ML IJ SOLN
10.0000 mg | INTRAVENOUS | Status: AC
  Administered 2018-01-31: 10 mg via INTRAVENOUS
  Filled 2018-01-31: qty 1

## 2018-01-31 MED ORDER — ACETAMINOPHEN 650 MG RE SUPP: 650.0000 mg | Freq: Four times a day (QID) | RECTAL | Status: DC | PRN

## 2018-01-31 NOTE — ED Notes (Signed)
Attempted to call report to [facility].  Nurse at lunch, will call back when she returns.

## 2018-01-31 NOTE — ED Notes (Addendum)
INR of 5.10 critical result called from lab. RN Beaulaurier aware and MD Roxanne Mins aware.

## 2018-01-31 NOTE — ED Notes (Signed)
Patient moved into room 27 in TCU.  Patient belongings given to family.  Patient c/o left upper back/shoulder pain she rates as a 10.  Patient had pain medication at [phone] and is not due for anymore currently.  MD notified.

## 2018-01-31 NOTE — Progress Notes (Addendum)
[facility]   Patient admitted by my partner after midnight see H&P for further detail  Patient chart reviewed, patient seen and examined.  Patient is a 82 year old female with medical history significant for hypertension, PAF on warfarin and PPM presented to the emergency department after mechanical fall.  Evaluation INR noted to be 5.1 CT of the head showed small left frontal subarachnoid hemorrhage.  Status post K [facility]ra and vitamin K.  INR this morning 1.0.  Today complaining of facial pain. Patient is neurologically intact .  Denies blurry vision, dizziness, new weakness or numbness.  Patient awaiting transfer to [facility] for neurosurgical evaluation.  Keep blood pressure under control, added hydralazine as needed.  Chipper Oman, MD

## 2018-01-31 NOTE — ED Notes (Signed)
Spoke with Dr. Quincy Simmonds who said the patient could have a clear liquid diet order.

## 2018-01-31 NOTE — ED Notes (Signed)
HOB remains elevated.

## 2018-01-31 NOTE — ED Notes (Addendum)
Dr Horald Pollen paged for pain medications. Wanted to hold off on pain medications at this time. Notified about episode of vomiting. Anne Anthony stated that as long as patient didn't have multiple episodes, wasn't projectile, and wasn't altered it was ok.

## 2018-01-31 NOTE — ED Notes (Signed)
Report called to [place] on [place].

## 2018-01-31 NOTE — ED Triage Notes (Signed)
Triage note entered via paper chart during downtime. Pt reports falling around 2330. Bruising and swelling noted to left eye and cheek, left wrist and left knee. Pt alert and oriented. Pt does take coumadin.

## 2018-01-31 NOTE — ED Provider Notes (Signed)
[facility] DEPT Provider Note   CSN: [id number] Arrival date & time: 01/31/18  0116     History   Chief Complaint Chief Complaint  Patient presents with  . Fall    HPI Anne Anthony is a 82 y.o. female.  The history is provided by the patient.  She has history of hypertension, hyperlipidemia, paroxysmal atrial fibrillation, permanent pacemaker and is on chronic anticoagulation with warfarin because of atrial fibrillation.  At home, she tripped and fell and landed on her left side.  She did hit her head and is complaining of some pain in the left periorbital area which she rates at 6/10.  She is also planning a pain in her left wrist and left knee, but they are much less severe.  She denies loss of consciousness.  She is anticoagulated on warfarin.  Last week, she had to stop warfarin for 5 days so that she could have a spinal injection, and she has just restarted a few days ago and is scheduled to have her INR checked in 2 days.  Past Medical History:  Diagnosis Date  . Atrial flutter ([place])    successful radiofrequency ablation 2007  . Diverticula of colon   . Family history of colon cancer    brother  . Hiatal hernia   . Hyperlipidemia   . Hypertension   . Pacemaker   . Paroxysmal atrial fibrillation (HCC)   . Severe protein-calorie malnutrition ([place]) 11/17/2014  . Tachycardia-bradycardia syndrome ([place])    permament pacermaker, dual chamber Medtronic on 06/2011  . Tubular adenoma     Patient Active Problem List   Diagnosis Date Noted  . Restrictive lung disease 05/24/2017  . Restrictive ventilatory defect 04/28/2016  . Nasal congestion 12/01/2014  . Postoperative anemia due to acute blood loss 11/23/2014  . Protein-calorie malnutrition, severe ([place]) 11/17/2014  . Severe protein-calorie malnutrition ([place]) 11/17/2014  . Anticoagulated on Coumadin   . Left displaced femoral neck fracture ([place])   . Hip fracture ([place]) 11/14/2014  . Closed  left hip fracture ([place]) 11/14/2014  . Back pain 05/14/2014  . Paroxysmal atrial fibrillation (HCC)   . Tachycardia-bradycardia syndrome ([place])   . Hypertension   . Hyperlipidemia   . Pacemaker   . Long term current use of anticoagulant therapy 01/19/2013  . GERD 08/17/2010    Past Surgical History:  Procedure Laterality Date  . ABDOMINAL SURGERY    . COLONOSCOPY  08/17/2010   Dr. Gala Romney- internal hemorrhoids, o/w normal rectum, L sided diverticula, tubular adenoma  . ESOPHAGOGASTRODUODENOSCOPY  10/502011   Dr. Gala Romney- huge diaphragmatic/hiatial hernia, fundal gland polyps not manipulated o/w noral appearing gastric mucosa, patent pylorus, normal D1 & D2  . HIATAL HERNIA REPAIR  2005  . HIP ARTHROPLASTY Left 11/16/2014   Procedure: ARTHROPLASTY BIPOLAR HIP;  Surgeon: Mauri Pole, MD;  Location: [facility] ORS;  Service: Orthopedics;  Laterality: Left;  . PARTIAL HYSTERECTOMY    . permament pacemaker  06/2011   dual chamber , Medtronic Adapta serial # [id number]  last checked 02/05/2013  . RADIOFREQUENCY ABLATION  06/28/2006    OB History   None      Home Medications    Prior to Admission medications   Medication Sig Start Date End Date Taking? Authorizing Provider  acetaminophen (TYLENOL) 325 MG tablet Take 1-2 tablets (325-650 mg total) by mouth every 4 (four) hours as needed for fever, headache or mild pain. 11/18/14  Yes Danae Orleans, PA-C  dexlansoprazole (DEXILANT) 60 MG capsule  Take 1 capsule (60 mg total) by mouth daily. 09/14/17  Yes Croitoru, Mihai, MD  gabapentin (NEURONTIN) 300 MG capsule 1 cap at bedtime Patient taking differently: Take 300 mg by mouth 2 (two) times daily. 1 cap at bedtime 06/28/15  Yes Cameron Sprang, MD  HYDROcodone-acetaminophen (NORCO/VICODIN) 5-325 MG tablet Take 1 tablet by mouth every 6 (six) hours as needed for moderate pain (take 1 tablet every 4-6 hours as needed for pain).   Yes [provider]  metoprolol tartrate (LOPRESSOR) 25 MG tablet  Take 25 mg by mouth 2 (two) times daily. 10/02/14  Yes [provider]  simvastatin (ZOCOR) 20 MG tablet Take 20 mg by mouth every evening.   Yes [provider]  trimethoprim (TRIMPEX) 100 MG tablet Take 100 mg by mouth daily. 03/22/16  Yes [provider]  warfarin (COUMADIN) 2 MG tablet Take 1 to 1.5 tablets by mouth daily as directed by coumadin clinic Patient taking differently: Take 1 Tablet on Sunday, Tuesday, Wednesday, Thursday and Saturday, Take 1.5 tablets on Monday and Friday 07/01/15  Yes Croitoru, Mihai, MD  warfarin (COUMADIN) 2 MG tablet TAKE 1 TO 1.5 TABLETS BY MOUTH DAILY AS DIRECTED BY COUMADIN CLINIC Patient not taking: Reported on 01/31/2018 01/18/18   Croitoru, Dani Gobble, MD    Family History Family History  Problem Relation Age of Onset  . Diabetes Mother     Social History Social History   Tobacco Use  . Smoking status: Never Smoker  . Smokeless tobacco: Never Used  Substance Use Topics  . Alcohol use: No  . Drug use: No     Allergies   Morphine and related   Review of Systems Review of Systems  All other systems reviewed and are negative.    Physical Exam Updated Vital Signs BP (!) 151/84   Pulse 75   Temp 98.9 F (37.2 C) (Oral)   Resp 16   Ht 5\' 4"  (1.626 m)   Wt 53.1 kg (117 lb)   SpO2 94%   BMI 20.08 kg/m   Physical Exam  Nursing note and vitals reviewed.  82 year old female, resting comfortably and in no acute distress. Vital signs are significant for elevated systolic blood pressure. Oxygen saturation is 94%, which is normal. Head is normocephalic: There is left periorbital ecchymosis with mild swelling which does extend into the left malar area. PERRLA, EOMI. Oropharynx is clear. Neck is nontender without adenopathy or JVD. Back is nontender and there is no CVA tenderness. Lungs are clear without rales, wheezes, or rhonchi. Chest is nontender. Heart has regular rate and rhythm without murmur. Abdomen is  soft, flat, nontender without masses or hepatosplenomegaly and peristalsis is normoactive. Extremities: Hematoma is noted in the ulnar aspect of the distal left forearm.  There is only mild tenderness around this.  There is full range of motion of the left wrist without significant pain.  There is no obvious swelling or effusion of the left knee.  There is minimal tenderness to palpation rather diffusely.  Full passive range of motion is present without significant pain.  There is no instability of the left knee.  No other extremity injury is seen. Skin is warm and dry without rash. Neurologic: Mental status is normal, cranial nerves are intact, there are no motor or sensory deficits.  ED Treatments / Results  Labs (all labs ordered are listed, but only abnormal results are displayed) Labs Reviewed  PROTIME-INR - Abnormal; Notable for the following components:  Result Value   Prothrombin Time 46.8 (*)    INR 5.10 (*)    All other components within normal limits  BASIC METABOLIC PANEL - Abnormal; Notable for the following components:   Glucose, Bld 136 (*)    GFR calc non Af Amer 52 (*)    All other components within normal limits  CBC WITH DIFFERENTIAL/PLATELET - Abnormal; Notable for the following components:   MCV 107.6 (*)    MCH 34.4 (*)    All other components within normal limits  TYPE AND SCREEN   Radiology Dg Wrist Complete Left  Result Date: 01/31/2018 CLINICAL DATA:  Fall this morning with left wrist pain. EXAM: LEFT WRIST - COMPLETE 3+ VIEW COMPARISON:  None. FINDINGS: There is no evidence of fracture or dislocation. Mild osteoarthritis throughout the carpal bones and base of the thumb. There is chondrocalcinosis. Dorsal and ulnar soft tissue prominence and edema. IMPRESSION: 1. Soft tissue edema about the ulnar aspect of the wrist with probable hematoma. 2. No fracture or dislocation. Electronically Signed   By: Jeb Levering M.D.   On: 01/31/2018 04:40   Ct Head Wo  Contrast  Result Date: 01/31/2018 CLINICAL DATA:  Fall with left cheek bruising. No loss of consciousness. EXAM: CT HEAD WITHOUT CONTRAST CT MAXILLOFACIAL WITHOUT CONTRAST CT CERVICAL SPINE WITHOUT CONTRAST TECHNIQUE: Multidetector CT imaging of the head, cervical spine, and maxillofacial structures were performed using the standard protocol without intravenous contrast. Multiplanar CT image reconstructions of the cervical spine and maxillofacial structures were also generated. COMPARISON:  Head CT 01/27/2015 FINDINGS: CT HEAD FINDINGS Brain: Faint subarachnoid hemorrhage in the left frontal lobe. Chronic cerebellar basal gangliar calcifications are unchanged from prior. No subdural or extra-axial fluid collection. Age related atrophy and chronic small vessel ischemia. No evidence of acute infarct. Vascular: Atherosclerosis of skullbase vasculature without hyperdense vessel or abnormal calcification. Skull: No fracture or focal lesion. Other: None. CT MAXILLOFACIAL FINDINGS Osseous: Zygomatic arches, nasal bones, and mandibles are intact. Temporomandibular joints are congruent. Patient is edentulous. Orbits: No orbital fracture. Both orbits and globes are intact. Bilateral cataract resection. Sinuses: Mild mucosal thickening of the ethmoid air cells and bilateral maxillary sinuses. No sinus fluid levels or sinus fracture. Soft tissues: Soft tissue edema small hematoma about the left cheek. Soft tissue edema lateral to the left orbit. CT CERVICAL SPINE FINDINGS Alignment: Straightening of normal lordosis. No traumatic subluxation. Skull base and vertebrae: No acute fracture. Vertebral body hemangioma within C7. The bones are under mineralized. Degenerative pannus at C1-C2. Soft tissues and spinal canal: No prevertebral fluid or swelling. No visible canal hematoma. Disc levels: Diffuse disc space narrowing throughout with endplate spurring, most prominent at C5-C6. Multilevel facet arthropathy. Upper chest: Left  pleural effusion is partially included. There are ground-glass opacities in the included left lung. No visualized rib fracture. Other: Carotid calcifications. IMPRESSION: 1. Faint left frontal subarachnoid hemorrhage.  No skull fracture. 2. Left facial soft tissue edema and hematoma, no facial bone fracture. 3. Multilevel degenerative change throughout the cervical spine without acute fracture or subluxation. 4. Left pleural effusion is partially included, at least moderate in size. There are ground-glass opacities throughout the included left lung. This is age indeterminate, there is clinical concern for acute chest trauma, recommend dedicated chest CT. Critical Value/emergent results were called by telephone at the time of interpretation on 01/31/2018 at 03:49 am to Dr. Delora Fuel , who verbally acknowledged these results. Electronically Signed   By: Jeb Levering M.D.   On: 01/31/2018  04:58   Ct Cervical Spine Wo Contrast  Result Date: 01/31/2018 CLINICAL DATA:  Fall with left cheek bruising. No loss of consciousness. EXAM: CT HEAD WITHOUT CONTRAST CT MAXILLOFACIAL WITHOUT CONTRAST CT CERVICAL SPINE WITHOUT CONTRAST TECHNIQUE: Multidetector CT imaging of the head, cervical spine, and maxillofacial structures were performed using the standard protocol without intravenous contrast. Multiplanar CT image reconstructions of the cervical spine and maxillofacial structures were also generated. COMPARISON:  Head CT 01/27/2015 FINDINGS: CT HEAD FINDINGS Brain: Faint subarachnoid hemorrhage in the left frontal lobe. Chronic cerebellar basal gangliar calcifications are unchanged from prior. No subdural or extra-axial fluid collection. Age related atrophy and chronic small vessel ischemia. No evidence of acute infarct. Vascular: Atherosclerosis of skullbase vasculature without hyperdense vessel or abnormal calcification. Skull: No fracture or focal lesion. Other: None. CT MAXILLOFACIAL FINDINGS Osseous: Zygomatic  arches, nasal bones, and mandibles are intact. Temporomandibular joints are congruent. Patient is edentulous. Orbits: No orbital fracture. Both orbits and globes are intact. Bilateral cataract resection. Sinuses: Mild mucosal thickening of the ethmoid air cells and bilateral maxillary sinuses. No sinus fluid levels or sinus fracture. Soft tissues: Soft tissue edema small hematoma about the left cheek. Soft tissue edema lateral to the left orbit. CT CERVICAL SPINE FINDINGS Alignment: Straightening of normal lordosis. No traumatic subluxation. Skull base and vertebrae: No acute fracture. Vertebral body hemangioma within C7. The bones are under mineralized. Degenerative pannus at C1-C2. Soft tissues and spinal canal: No prevertebral fluid or swelling. No visible canal hematoma. Disc levels: Diffuse disc space narrowing throughout with endplate spurring, most prominent at C5-C6. Multilevel facet arthropathy. Upper chest: Left pleural effusion is partially included. There are ground-glass opacities in the included left lung. No visualized rib fracture. Other: Carotid calcifications. IMPRESSION: 1. Faint left frontal subarachnoid hemorrhage.  No skull fracture. 2. Left facial soft tissue edema and hematoma, no facial bone fracture. 3. Multilevel degenerative change throughout the cervical spine without acute fracture or subluxation. 4. Left pleural effusion is partially included, at least moderate in size. There are ground-glass opacities throughout the included left lung. This is age indeterminate, there is clinical concern for acute chest trauma, recommend dedicated chest CT. Critical Value/emergent results were called by telephone at the time of interpretation on 01/31/2018 at 03:49 am to Dr. Delora Fuel , who verbally acknowledged these results. Electronically Signed   By: Jeb Levering M.D.   On: 01/31/2018 04:58   Dg Chest Port 1 View  Result Date: 01/31/2018 CLINICAL DATA:  Chest congestion. EXAM: PORTABLE  CHEST 1 VIEW COMPARISON:  Radiographs 08/15/2016.  Chest CT 05/19/2016 FINDINGS: Left-sided pacemaker in place. Opacification of the lower 2/3 of left hemithorax is likely secondary to hiatal and paraesophageal hernia, which on prior CT contained stomach and splenic flexure of the colon. Heart size difficult to assess due to large hiatal hernia. There is aortic atherosclerosis. Mild vascular congestion. Left pleural effusion on prior cervical spine CT is not well demonstrated on the current exam. No pneumothorax. Visualized right lung is clear. IMPRESSION: 1. Opacification of lower 2/3 of left hemithorax is likely secondary to large hiatal/paraesophageal hernia containing stomach and splenic flexure of the colon, as seen on prior CT. Left pleural effusion on cervical spine CT is not well demonstrated on the current exam. 2. Heart size difficult to assess due to large hiatal hernia. Aortic atherosclerosis. 3. Mild vascular congestion. Electronically Signed   By: Jeb Levering M.D.   On: 01/31/2018 06:07   Dg Knee Complete 4 Views Left  Result  Date: 01/31/2018 CLINICAL DATA:  Left knee pain after fall. EXAM: LEFT KNEE - COMPLETE 4+ VIEW COMPARISON:  None. FINDINGS: No evidence of fracture, dislocation, or joint effusion. No evidence of arthropathy or other focal bone abnormality. Presumed varicosities medially. There are vascular calcifications. IMPRESSION: No fracture, dislocation, or joint effusion. Electronically Signed   By: Jeb Levering M.D.   On: 01/31/2018 04:36   Ct Maxillofacial Wo Contrast  Result Date: 01/31/2018 CLINICAL DATA:  Fall with left cheek bruising. No loss of consciousness. EXAM: CT HEAD WITHOUT CONTRAST CT MAXILLOFACIAL WITHOUT CONTRAST CT CERVICAL SPINE WITHOUT CONTRAST TECHNIQUE: Multidetector CT imaging of the head, cervical spine, and maxillofacial structures were performed using the standard protocol without intravenous contrast. Multiplanar CT image reconstructions of the  cervical spine and maxillofacial structures were also generated. COMPARISON:  Head CT 01/27/2015 FINDINGS: CT HEAD FINDINGS Brain: Faint subarachnoid hemorrhage in the left frontal lobe. Chronic cerebellar basal gangliar calcifications are unchanged from prior. No subdural or extra-axial fluid collection. Age related atrophy and chronic small vessel ischemia. No evidence of acute infarct. Vascular: Atherosclerosis of skullbase vasculature without hyperdense vessel or abnormal calcification. Skull: No fracture or focal lesion. Other: None. CT MAXILLOFACIAL FINDINGS Osseous: Zygomatic arches, nasal bones, and mandibles are intact. Temporomandibular joints are congruent. Patient is edentulous. Orbits: No orbital fracture. Both orbits and globes are intact. Bilateral cataract resection. Sinuses: Mild mucosal thickening of the ethmoid air cells and bilateral maxillary sinuses. No sinus fluid levels or sinus fracture. Soft tissues: Soft tissue edema small hematoma about the left cheek. Soft tissue edema lateral to the left orbit. CT CERVICAL SPINE FINDINGS Alignment: Straightening of normal lordosis. No traumatic subluxation. Skull base and vertebrae: No acute fracture. Vertebral body hemangioma within C7. The bones are under mineralized. Degenerative pannus at C1-C2. Soft tissues and spinal canal: No prevertebral fluid or swelling. No visible canal hematoma. Disc levels: Diffuse disc space narrowing throughout with endplate spurring, most prominent at C5-C6. Multilevel facet arthropathy. Upper chest: Left pleural effusion is partially included. There are ground-glass opacities in the included left lung. No visualized rib fracture. Other: Carotid calcifications. IMPRESSION: 1. Faint left frontal subarachnoid hemorrhage.  No skull fracture. 2. Left facial soft tissue edema and hematoma, no facial bone fracture. 3. Multilevel degenerative change throughout the cervical spine without acute fracture or subluxation. 4. Left  pleural effusion is partially included, at least moderate in size. There are ground-glass opacities throughout the included left lung. This is age indeterminate, there is clinical concern for acute chest trauma, recommend dedicated chest CT. Critical Value/emergent results were called by telephone at the time of interpretation on 01/31/2018 at 03:49 am to Dr. Delora Fuel , who verbally acknowledged these results. Electronically Signed   By: Jeb Levering M.D.   On: 01/31/2018 04:58    Procedures Procedures  CRITICAL CARE Performed by: Delora Fuel Total critical care time: 40 minutes Critical care time was exclusive of separately billable procedures and treating other patients. Critical care was necessary to treat or prevent imminent or life-threatening deterioration. Critical care was time spent personally by me on the following activities: development of treatment plan with patient and/or surrogate as well as nursing, discussions with consultants, evaluation of patient's response to treatment, examination of patient, obtaining history from patient or surrogate, ordering and performing treatments and interventions, ordering and review of laboratory studies, ordering and review of radiographic studies, pulse oximetry and re-evaluation of patient's condition.  Medications Ordered in ED Medications  prothrombin complex conc human (KCENTRA) IVPB  1,584 Units (has no administration in time range)  phytonadione (VITAMIN K) 10 mg in dextrose 5 % 50 mL IVPB (10 mg Intravenous New Bag/Given 01/31/18 0419)     Initial Impression / Assessment and Plan / ED Course  I have reviewed the triage vital signs and the nursing notes.  Pertinent labs & imaging results that were available during my care of the patient were reviewed by me and considered in my medical decision making (see chart for details).  Fall with facial injury in patient who is anticoagulated on warfarin.  She is sent for CT scans of head,  face, cervical spine as well as plain x-rays of left wrist and left knee.  INR was checked and has come back markedly elevated at 5.1.  Verbal report of CT scan was obtained from radiologist noting small subarachnoid hemorrhage.  I have reviewed the images, and it is not obvious to me where her hemorrhage is.  However, because of over anticoagulated state and intracranial hemorrhage, she is started on K-Centra protocol.  Case is discussed with Dr. Arnoldo Morale of neurosurgery service who request the patient be admitted to hospitalist service and transferred to [facility].  Case is discussed with Dr. Alcario Drought of [facility] hospitalist who agrees to admit the patient and arrange for transfer.  Final Clinical Impressions(s) / ED Diagnoses   Final diagnoses:  Traumatic subarachnoid hemorrhage without loss of consciousness, initial encounter ([place])  Supratherapeutic INR  Contusion of face, initial encounter  Contusion of left forearm, initial encounter  Contusion of left knee, initial encounter    ED Discharge Orders    None       Delora Fuel, MD 09/81/19 [phone]

## 2018-01-31 NOTE — Progress Notes (Signed)
Patient arrived unit safely by [facility]

## 2018-01-31 NOTE — H&P (Addendum)
History and Physical    Anne Anthony YQI:[id number] DOB: 1936-01-04 DOA: 01/31/2018  PCP: Sharilyn Sites, MD  Patient coming from: Home  I have personally briefly reviewed patient's old medical records in [place]  Chief Complaint: Fall  HPI: Anne Anthony is a 82 y.o. female with medical history significant of PPM, HTN, HLD, chronic coumadin for PAF.  Patient presents to ED after she tripped and had mechanical fall at home.  Landed on L side.  Hit head.  Now pain in L periorbital area and face, 6/10.  No LOC.  Pain in L wrist and L knee much less severe.  Coumadin stopped last week for spinal injection and then restarted, was supposed to have INR checked in 2 days.   ED Course: INR is 5.1.  CT shows  Faint left frontal subarachnoid hemorrhage.  No skull, facial bone, nor neck fx.  Does show L pleural effusion and ground glass opacities with included lung.   Review of Systems: As per HPI otherwise 10 point review of systems negative.   Past Medical History:  Diagnosis Date  . Atrial flutter ([place])    successful radiofrequency ablation 2007  . Diverticula of colon   . Family history of colon cancer    brother  . Hiatal hernia   . Hyperlipidemia   . Hypertension   . Pacemaker   . Paroxysmal atrial fibrillation (H[facility])   . Severe protein-calorie malnutrition ([place]) 11/17/2014  . Tachycardia-bradycardia syndrome ([place])    permament pacermaker, dual chamber Medtronic on 06/2011  . Tubular adenoma     Past Surgical History:  Procedure Laterality Date  . ABDOMINAL SURGERY    . COLONOSCOPY  08/17/2010   Dr. Gala Romney- internal hemorrhoids, o/w normal rectum, L sided diverticula, tubular adenoma  . ESOPHAGOGASTRODUODENOSCOPY  10/502011   Dr. Gala Romney- huge diaphragmatic/hiatial hernia, fundal gland polyps not manipulated o/w noral appearing gastric mucosa, patent pylorus, normal D1 & D2  . HIATAL HERNIA REPAIR  2005  . HIP ARTHROPLASTY Left 11/16/2014   Procedure: ARTHROPLASTY  BIPOLAR HIP;  Surgeon: Mauri Pole, MD;  Location: [facility];  Service: Orthopedics;  Laterality: Left;  . PARTIAL HYSTERECTOMY    . permament pacemaker  06/2011   dual chamber , Medtronic Adapta serial # [id number]  last checked 02/05/2013  . RADIOFREQUENCY ABLATION  06/28/2006     reports that she has never smoked. She has never used smokeless tobacco. She reports that she does not drink alcohol or use drugs.  Allergies  Allergen Reactions  . Morphine And Related Other (See Comments)    Hallucination    Family History  Problem Relation Age of Onset  . Diabetes Mother      Prior to Admission medications   Medication Sig Start Date End Date Taking? Authorizing Provider  acetaminophen (TYLENOL) 325 MG tablet Take 1-2 tablets (325-650 mg total) by mouth every 4 (four) hours as needed for fever, headache or mild pain. 11/18/14  Yes Danae Orleans, PA-C  dexlansoprazole (DEXILANT) 60 MG capsule Take 1 capsule (60 mg total) by mouth daily. 09/14/17  Yes Croitoru, Mihai, MD  gabapentin (NEURONTIN) 300 MG capsule 1 cap at bedtime Patient taking differently: Take 300 mg by mouth 2 (two) times daily. 1 cap at bedtime 06/28/15  Yes Cameron Sprang, MD  HYDROcodone-acetaminophen (NORCO/VICODIN) 5-325 MG tablet Take 1 tablet by mouth every 6 (six) hours as needed for moderate pain (take 1 tablet every 4-6 hours as needed for pain).   Yes [provider]  metoprolol tartrate (LOPRESSOR) 25 MG tablet Take 25 mg by mouth 2 (two) times daily. 10/02/14  Yes [provider]  simvastatin (ZOCOR) 20 MG tablet Take 20 mg by mouth every evening.   Yes [provider]  trimethoprim (TRIMPEX) 100 MG tablet Take 100 mg by mouth daily. 03/22/16  Yes [provider]  warfarin (COUMADIN) 2 MG tablet Take 1 to 1.5 tablets by mouth daily as directed by coumadin clinic Patient taking differently: Take 1 Tablet on Sunday, Tuesday, Wednesday, Thursday and Saturday, Take 1.5 tablets on  Monday and Friday 07/01/15  Yes Croitoru, Mihai, MD    Physical Exam: Vitals:   01/31/18 0129 01/31/18 0406 01/31/18 0448 01/31/18 0500  BP: (!) 151/84  (!) 153/76 (!) 153/70  Pulse: 75  70 70  Resp: 16  18 (!) 22  Temp: 98.9 F (37.2 C)     TempSrc: Oral     SpO2: 94%  95% 91%  Weight:  53.1 kg (117 lb)    Height:  5\' 4"  (1.626 m)      Constitutional: NAD, calm, comfortable Eyes: PERRL, lids and conjunctivae normal ENMT: Mucous membranes are moist. Posterior pharynx clear of any exudate or lesions.Normal dentition. Hard of hearing. Neck: normal, supple, no masses, no thyromegaly Respiratory: clear to auscultation bilaterally, no wheezing, no crackles. Normal respiratory effort. No accessory muscle use.  Cardiovascular: Regular rate and rhythm, no murmurs / rubs / gallops. No extremity edema. 2+ pedal pulses. No carotid bruits.  Abdomen: no tenderness, no masses palpated. No hepatosplenomegaly. Bowel sounds positive.  Musculoskeletal: no clubbing / cyanosis. No joint deformity upper and lower extremities. Good ROM, no contractures. Normal muscle tone.  Skin: Echymosis and edema to L face Neurologic: CN 2-12 grossly intact. Sensation intact, DTR normal. Strength 5/5 in all 4.  Psychiatric: Normal judgment and insight. Alert and oriented x 3. Normal mood.    Labs on Admission: I have personally reviewed following labs and imaging studies  CBC: Recent Labs  Lab 01/31/18 0359  WBC 10.2  NEUTROABS 7.4  HGB 13.6  HCT 42.5  MCV 107.6*  PLT 637   Basic Metabolic Panel: Recent Labs  Lab 01/31/18 0359  NA 139  K 4.3  CL 105  CO2 25  GLUCOSE 136*  BUN 17  CREATININE 1.00  CALCIUM 8.9   GFR: Estimated Creatinine Clearance: 38.2 mL/min (by C-G formula based on SCr of 1 mg/dL). Liver Function Tests: No results for input(s): AST, ALT, ALKPHOS, BILITOT, PROT, ALBUMIN in the last 168 hours. No results for input(s): LIPASE, AMYLASE in the last 168 hours. No results for  input(s): AMMONIA in the last 168 hours. Coagulation Profile: Recent Labs  Lab 01/31/18 0308  INR 5.10*   Cardiac Enzymes: No results for input(s): CKTOTAL, CKMB, CKMBINDEX, TROPONINI in the last 168 hours. BNP (last 3 results) No results for input(s): PROBNP in the last 8760 hours. HbA1C: No results for input(s): HGBA1C in the last 72 hours. CBG: No results for input(s): GLUCAP in the last 168 hours. Lipid Profile: No results for input(s): CHOL, HDL, LDLCALC, TRIG, CHOLHDL, LDLDIRECT in the last 72 hours. Thyroid Function Tests: No results for input(s): TSH, T4TOTAL, FREET4, T3FREE, THYROIDAB in the last 72 hours. Anemia Panel: No results for input(s): VITAMINB12, FOLATE, FERRITIN, TIBC, IRON, RETICCTPCT in the last 72 hours. Urine analysis:    Component Value Date/Time   COLORURINE YELLOW 02/23/2015 2005   APPEARANCEUR CLEAR 02/23/2015 2005   LABSPEC 1.022 02/23/2015 2005  PHURINE 6.0 02/23/2015 2005   GLUCOSEU NEGATIVE 02/23/2015 2005   HGBUR SMALL (A) 02/23/2015 2005   BILIRUBINUR NEGATIVE 02/23/2015 2005   KETONESUR NEGATIVE 02/23/2015 2005   PROTEINUR NEGATIVE 02/23/2015 2005   UROBILINOGEN 1.0 02/23/2015 2005   NITRITE NEGATIVE 02/23/2015 2005   LEUKOCYTESUR SMALL (A) 02/23/2015 2005    Radiological Exams on Admission: Dg Wrist Complete Left  Result Date: 01/31/2018 CLINICAL DATA:  Fall this morning with left wrist pain. EXAM: LEFT WRIST - COMPLETE 3+ VIEW COMPARISON:  None. FINDINGS: There is no evidence of fracture or dislocation. Mild osteoarthritis throughout the carpal bones and base of the thumb. There is chondrocalcinosis. Dorsal and ulnar soft tissue prominence and edema. IMPRESSION: 1. Soft tissue edema about the ulnar aspect of the wrist with probable hematoma. 2. No fracture or dislocation. Electronically Signed   By: Jeb Levering M.D.   On: 01/31/2018 04:40   Ct Head Wo Contrast  Result Date: 01/31/2018 CLINICAL DATA:  Fall with left cheek  bruising. No loss of consciousness. EXAM: CT HEAD WITHOUT CONTRAST CT MAXILLOFACIAL WITHOUT CONTRAST CT CERVICAL SPINE WITHOUT CONTRAST TECHNIQUE: Multidetector CT imaging of the head, cervical spine, and maxillofacial structures were performed using the standard protocol without intravenous contrast. Multiplanar CT image reconstructions of the cervical spine and maxillofacial structures were also generated. COMPARISON:  Head CT 01/27/2015 FINDINGS: CT HEAD FINDINGS Brain: Faint subarachnoid hemorrhage in the left frontal lobe. Chronic cerebellar basal gangliar calcifications are unchanged from prior. No subdural or extra-axial fluid collection. Age related atrophy and chronic small vessel ischemia. No evidence of acute infarct. Vascular: Atherosclerosis of skullbase vasculature without hyperdense vessel or abnormal calcification. Skull: No fracture or focal lesion. Other: None. CT MAXILLOFACIAL FINDINGS Osseous: Zygomatic arches, nasal bones, and mandibles are intact. Temporomandibular joints are congruent. Patient is edentulous. Orbits: No orbital fracture. Both orbits and globes are intact. Bilateral cataract resection. Sinuses: Mild mucosal thickening of the ethmoid air cells and bilateral maxillary sinuses. No sinus fluid levels or sinus fracture. Soft tissues: Soft tissue edema small hematoma about the left cheek. Soft tissue edema lateral to the left orbit. CT CERVICAL SPINE FINDINGS Alignment: Straightening of normal lordosis. No traumatic subluxation. Skull base and vertebrae: No acute fracture. Vertebral body hemangioma within C7. The bones are under mineralized. Degenerative pannus at C1-C2. Soft tissues and spinal canal: No prevertebral fluid or swelling. No visible canal hematoma. Disc levels: Diffuse disc space narrowing throughout with endplate spurring, most prominent at C5-C6. Multilevel facet arthropathy. Upper chest: Left pleural effusion is partially included. There are ground-glass opacities in  the included left lung. No visualized rib fracture. Other: Carotid calcifications. IMPRESSION: 1. Faint left frontal subarachnoid hemorrhage.  No skull fracture. 2. Left facial soft tissue edema and hematoma, no facial bone fracture. 3. Multilevel degenerative change throughout the cervical spine without acute fracture or subluxation. 4. Left pleural effusion is partially included, at least moderate in size. There are ground-glass opacities throughout the included left lung. This is age indeterminate, there is clinical concern for acute chest trauma, recommend dedicated chest CT. Critical Value/emergent results were called by telephone at the time of interpretation on 01/31/2018 at 03:49 am to Dr. Delora Fuel , who verbally acknowledged these results. Electronically Signed   By: Jeb Levering M.D.   On: 01/31/2018 04:58   Ct Cervical Spine Wo Contrast  Result Date: 01/31/2018 CLINICAL DATA:  Fall with left cheek bruising. No loss of consciousness. EXAM: CT HEAD WITHOUT CONTRAST CT MAXILLOFACIAL WITHOUT CONTRAST CT CERVICAL SPINE  WITHOUT CONTRAST TECHNIQUE: Multidetector CT imaging of the head, cervical spine, and maxillofacial structures were performed using the standard protocol without intravenous contrast. Multiplanar CT image reconstructions of the cervical spine and maxillofacial structures were also generated. COMPARISON:  Head CT 01/27/2015 FINDINGS: CT HEAD FINDINGS Brain: Faint subarachnoid hemorrhage in the left frontal lobe. Chronic cerebellar basal gangliar calcifications are unchanged from prior. No subdural or extra-axial fluid collection. Age related atrophy and chronic small vessel ischemia. No evidence of acute infarct. Vascular: Atherosclerosis of skullbase vasculature without hyperdense vessel or abnormal calcification. Skull: No fracture or focal lesion. Other: None. CT MAXILLOFACIAL FINDINGS Osseous: Zygomatic arches, nasal bones, and mandibles are intact. Temporomandibular joints are  congruent. Patient is edentulous. Orbits: No orbital fracture. Both orbits and globes are intact. Bilateral cataract resection. Sinuses: Mild mucosal thickening of the ethmoid air cells and bilateral maxillary sinuses. No sinus fluid levels or sinus fracture. Soft tissues: Soft tissue edema small hematoma about the left cheek. Soft tissue edema lateral to the left orbit. CT CERVICAL SPINE FINDINGS Alignment: Straightening of normal lordosis. No traumatic subluxation. Skull base and vertebrae: No acute fracture. Vertebral body hemangioma within C7. The bones are under mineralized. Degenerative pannus at C1-C2. Soft tissues and spinal canal: No prevertebral fluid or swelling. No visible canal hematoma. Disc levels: Diffuse disc space narrowing throughout with endplate spurring, most prominent at C5-C6. Multilevel facet arthropathy. Upper chest: Left pleural effusion is partially included. There are ground-glass opacities in the included left lung. No visualized rib fracture. Other: Carotid calcifications. IMPRESSION: 1. Faint left frontal subarachnoid hemorrhage.  No skull fracture. 2. Left facial soft tissue edema and hematoma, no facial bone fracture. 3. Multilevel degenerative change throughout the cervical spine without acute fracture or subluxation. 4. Left pleural effusion is partially included, at least moderate in size. There are ground-glass opacities throughout the included left lung. This is age indeterminate, there is clinical concern for acute chest trauma, recommend dedicated chest CT. Critical Value/emergent results were called by telephone at the time of interpretation on 01/31/2018 at 03:49 am to Dr. Delora Fuel , who verbally acknowledged these results. Electronically Signed   By: Jeb Levering M.D.   On: 01/31/2018 04:58   Dg Knee Complete 4 Views Left  Result Date: 01/31/2018 CLINICAL DATA:  Left knee pain after fall. EXAM: LEFT KNEE - COMPLETE 4+ VIEW COMPARISON:  None. FINDINGS: No evidence  of fracture, dislocation, or joint effusion. No evidence of arthropathy or other focal bone abnormality. Presumed varicosities medially. There are vascular calcifications. IMPRESSION: No fracture, dislocation, or joint effusion. Electronically Signed   By: Jeb Levering M.D.   On: 01/31/2018 04:36   Ct Maxillofacial Wo Contrast  Result Date: 01/31/2018 CLINICAL DATA:  Fall with left cheek bruising. No loss of consciousness. EXAM: CT HEAD WITHOUT CONTRAST CT MAXILLOFACIAL WITHOUT CONTRAST CT CERVICAL SPINE WITHOUT CONTRAST TECHNIQUE: Multidetector CT imaging of the head, cervical spine, and maxillofacial structures were performed using the standard protocol without intravenous contrast. Multiplanar CT image reconstructions of the cervical spine and maxillofacial structures were also generated. COMPARISON:  Head CT 01/27/2015 FINDINGS: CT HEAD FINDINGS Brain: Faint subarachnoid hemorrhage in the left frontal lobe. Chronic cerebellar basal gangliar calcifications are unchanged from prior. No subdural or extra-axial fluid collection. Age related atrophy and chronic small vessel ischemia. No evidence of acute infarct. Vascular: Atherosclerosis of skullbase vasculature without hyperdense vessel or abnormal calcification. Skull: No fracture or focal lesion. Other: None. CT MAXILLOFACIAL FINDINGS Osseous: Zygomatic arches, nasal bones, and mandibles are  intact. Temporomandibular joints are congruent. Patient is edentulous. Orbits: No orbital fracture. Both orbits and globes are intact. Bilateral cataract resection. Sinuses: Mild mucosal thickening of the ethmoid air cells and bilateral maxillary sinuses. No sinus fluid levels or sinus fracture. Soft tissues: Soft tissue edema small hematoma about the left cheek. Soft tissue edema lateral to the left orbit. CT CERVICAL SPINE FINDINGS Alignment: Straightening of normal lordosis. No traumatic subluxation. Skull base and vertebrae: No acute fracture. Vertebral body  hemangioma within C7. The bones are under mineralized. Degenerative pannus at C1-C2. Soft tissues and spinal canal: No prevertebral fluid or swelling. No visible canal hematoma. Disc levels: Diffuse disc space narrowing throughout with endplate spurring, most prominent at C5-C6. Multilevel facet arthropathy. Upper chest: Left pleural effusion is partially included. There are ground-glass opacities in the included left lung. No visualized rib fracture. Other: Carotid calcifications. IMPRESSION: 1. Faint left frontal subarachnoid hemorrhage.  No skull fracture. 2. Left facial soft tissue edema and hematoma, no facial bone fracture. 3. Multilevel degenerative change throughout the cervical spine without acute fracture or subluxation. 4. Left pleural effusion is partially included, at least moderate in size. There are ground-glass opacities throughout the included left lung. This is age indeterminate, there is clinical concern for acute chest trauma, recommend dedicated chest CT. Critical Value/emergent results were called by telephone at the time of interpretation on 01/31/2018 at 03:49 am to Dr. Delora Fuel , who verbally acknowledged these results. Electronically Signed   By: Jeb Levering M.D.   On: 01/31/2018 04:58    EKG: Independently reviewed.  Assessment/Plan Principal Problem:   SAH (subarachnoid hemorrhage) (HCC) Active Problems:   Paroxysmal atrial fibrillation (HCC)   Hypertension   Pacemaker   Anticoagulated on Coumadin   Pleural effusion on left    1. SAH - traumatic 1. EDP gave K-centra and Vit K 2. Repeat INR post K-centra pending 3. EDP spoke with Dr. Arnoldo Morale who says to have [facility] admit to [facility] and they will consult 4. Will keep NPO until NS sees patient just in case SAH worsens in the meantime. 5. SAH pathway 6. Tele monitor 7. Frequent neuro checks 8. Bed rest until cleared by NS 2. Effusion seen on CT scan - 1. Follow up CXR 2. May need CT chest 3. Or given that patient  is asymptomatic, may just be chronic and related to patients known hiatal hernia that no one seems to be able to fix despite multiple attempts at [facility]'s 3. PAF - 1. Continue metoprolol 2. Stopping and reversing coumadin as above 4. HTN - continue metoprolol  DVT prophylaxis: SCDs Code Status: Full Family Communication: Daughter at bedside Disposition Plan: Home after admit Consults called: NS called by EDP Admission status: Admit to inpatient - inpatient status for traumatic SAH, elevated INR.   Etta Quill DO [facility] Pager [phone]  If 7AM-7PM, please contact day team taking care of patient [email or web address] Password [facility]  01/31/2018, 5:36 AM

## 2018-01-31 NOTE — ED Notes (Signed)
Bed: WA27 Expected date:  Expected time:  Means of arrival:  Comments: Hold for 25 

## 2018-01-31 NOTE — ED Notes (Signed)
Patient and family requesting meal tray.  Notified family and patient that patient is NPO status.

## 2018-01-31 NOTE — ED Notes (Signed)
[facility] called. 

## 2018-01-31 NOTE — ED Notes (Signed)
Report given to [facility]. 

## 2018-02-01 ENCOUNTER — Inpatient Hospital Stay (HOSPITAL_COMMUNITY): Payer: Medicare Other

## 2018-02-01 DIAGNOSIS — R791 Abnormal coagulation profile: Secondary | ICD-10-CM | POA: Diagnosis not present

## 2018-02-01 DIAGNOSIS — Z5181 Encounter for therapeutic drug level monitoring: Secondary | ICD-10-CM | POA: Diagnosis not present

## 2018-02-01 DIAGNOSIS — I609 Nontraumatic subarachnoid hemorrhage, unspecified: Secondary | ICD-10-CM | POA: Diagnosis not present

## 2018-02-01 DIAGNOSIS — I480 Paroxysmal atrial fibrillation: Secondary | ICD-10-CM | POA: Diagnosis not present

## 2018-02-01 LAB — PROTIME-INR
INR: 1
Prothrombin Time: 13.1 seconds (ref 11.4–15.2)

## 2018-02-01 NOTE — Care Management Note (Signed)
Case Management Note  Patient Details  Name: Anne Anthony MRN: [id number] Date of Birth: [birth year]  Subjective/Objective:   Pt admitted with [facility] post fall at home. She is from home alone.                 Action/Plan: MD please place orders for PT/OT as appropriate. CM following for d/c needs, physician orders.  Expected Discharge Date:  (unknown)               Expected Discharge Plan:     In-House Referral:     Discharge planning Services     Post Acute Care Choice:    Choice offered to:     DME Arranged:    DME Agency:     HH Arranged:    HH Agency:     Status of Service:  In process, will continue to follow  If discussed at Long Length of Stay Meetings, dates discussed:    Additional Comments:  Pollie Friar, RN 02/01/2018, 10:31 AM

## 2018-02-01 NOTE — Consult Note (Signed)
Reason for Consult: Traumatic subarachnoid hemorrhage Referring Physician: Dr. Barbaraann Anthony is an 82 y.o. female.  HPI: The patient is a 82 year old white female who is on Coumadin and took a fall yesterday.  She was initially seen at [facility] and a CAT scan demonstrated a tiny traumatic subarachnoid hemorrhage.  The patient was admitted for observation.  A neurosurgical consultation has been requested.  Presently the patient is alert and pleasant.  She complains of some soreness in her left shoulder.  She denies headaches, nausea, vomiting, seizure, neck pain, back pain, numbness, tingling, weakness, etc.  Past Medical History:  Diagnosis Date  . Atrial flutter ([place])    successful radiofrequency ablation 2007  . Diverticula of colon   . Family history of colon cancer    brother  . Hiatal hernia   . Hyperlipidemia   . Hypertension   . Pacemaker   . Paroxysmal atrial fibrillation ([facility])   . Severe protein-calorie malnutrition ([place]) 11/17/2014  . Tachycardia-bradycardia syndrome ([place])    permament pacermaker, dual chamber Medtronic on 06/2011  . Tubular adenoma     Past Surgical History:  Procedure Laterality Date  . ABDOMINAL SURGERY    . COLONOSCOPY  08/17/2010   Dr. Gala Romney- internal hemorrhoids, o/w normal rectum, L sided diverticula, tubular adenoma  . ESOPHAGOGASTRODUODENOSCOPY  10/502011   Dr. Gala Romney- huge diaphragmatic/hiatial hernia, fundal gland polyps not manipulated o/w noral appearing gastric mucosa, patent pylorus, normal D1 & D2  . HIATAL HERNIA REPAIR  2005  . HIP ARTHROPLASTY Left 11/16/2014   Procedure: ARTHROPLASTY BIPOLAR HIP;  Surgeon: Mauri Pole, MD;  Location: [facility];  Service: Orthopedics;  Laterality: Left;  . PARTIAL HYSTERECTOMY    . permament pacemaker  06/2011   dual chamber , Medtronic Adapta serial # [id number]  last checked 02/05/2013  . RADIOFREQUENCY ABLATION  06/28/2006    Family History  Problem Relation Age of Onset  .  Diabetes Mother     Social History:  reports that she has never smoked. She has never used smokeless tobacco. She reports that she does not drink alcohol or use drugs.  Allergies:  Allergies  Allergen Reactions  . Morphine And Related Other (See Comments)    Hallucination    Medications:  I have reviewed the patient's current medications. Prior to Admission:  Medications Prior to Admission  Medication Sig Dispense Refill Last Dose  . acetaminophen (TYLENOL) 325 MG tablet Take 1-2 tablets (325-650 mg total) by mouth every 4 (four) hours as needed for fever, headache or mild pain.   01/30/2018 at Unknown time  . dexlansoprazole (DEXILANT) 60 MG capsule Take 1 capsule (60 mg total) by mouth daily. 20 capsule 0 01/30/2018 at Unknown time  . gabapentin (NEURONTIN) 300 MG capsule 1 cap at bedtime (Patient taking differently: Take 300 mg by mouth 2 (two) times daily. 1 cap at bedtime) 90 capsule 0 01/30/2018 at Unknown time  . HYDROcodone-acetaminophen (NORCO/VICODIN) 5-325 MG tablet Take 1 tablet by mouth every 6 (six) hours as needed for moderate pain (take 1 tablet every 4-6 hours as needed for pain).   Past Month at Unknown time  . metoprolol tartrate (LOPRESSOR) 25 MG tablet Take 25 mg by mouth 2 (two) times daily.   01/30/2018 at 2230  . simvastatin (ZOCOR) 20 MG tablet Take 20 mg by mouth every evening.   01/30/2018 at Unknown time  . trimethoprim (TRIMPEX) 100 MG tablet Take 100 mg by mouth daily.   01/30/2018 at Unknown  time  . warfarin (COUMADIN) 2 MG tablet Take 1 to 1.5 tablets by mouth daily as directed by coumadin clinic (Patient taking differently: Take 1 Tablet on _0 0 mg Oral Daily 01/31/18 0530         Results for orders placed or performed during the hospital encounter of 01/31/18 (from the past 48 hour(s))  Protime-INR     Status: Abnormal   Collection Time: 01/31/18  3:08 AM  Result Value Ref Range   Prothrombin Time 46.8 (H) 11.4 - 15.2 seconds   INR 5.10 (HH)     Comment: REPEATED TO VERIFY CRITICAL RESULT CALLED TO, READ BACK BY AND VERIFIED WITH: JOHNSON,L RN 3.21.19 _1  ZANDO,C Performed at [facility], [place] [address] [place], [place] [address]   Basic metabolic panel     Status: Abnormal   Collection Time: 01/31/18  3:59 AM  Result Value Ref Range   Sodium 139 135 - 145 mmol/L   Potassium 4.3 3.5 - 5.1 mmol/L   Chloride 105 101 - 111 mmol/L   CO2 25 22 - 32 mmol/L   Glucose, Bld 136 (H) 65 - 99 mg/dL   BUN 17 6 - 20 mg/dL   Creatinine, Ser 1.00 0.44 - 1.00 mg/dL   Calcium 8.9 8.9 - 10.3 mg/dL   GFR calc non Af Amer 52 (L) >60 mL/min   GFR calc Af Amer >60 >60 mL/min    Comment: (NOTE) The eGFR has been calculated using the CKD EPI equation. This calculation has not been validated in all clinical situations. eGFR's persistently <60 mL/min signify possible Chronic Kidney Disease.    Anion gap 9 5 - 15    Comment: Performed at [facility], [place] [address] [place],  [address]  CBC with Differential     Status: Abnormal   Collection Time: 01/31/18  3:59 AM  Result Value Ref Range   WBC 10.2 4.0 - 10.5 K/uL   RBC 3.95 3.87 - 5.11 MIL/uL   Hemoglobin 13.6 12.0 - 15.0 g/dL   HCT 42.5  36.0 - 46.0 %   MCV 107.6 (H) 78.0 - 100.0 fL   MCH 34.4 (H) 26.0 - 34.0 pg   MCHC 32.0 30.0 - 36.0 g/dL   RDW 13.4 11.5 - 15.5 %   Platelets 187 150 - 400 K/uL   Neutrophils Relative % 73 %   Neutro Abs 7.4 1.7 - 7.7 K/uL   Lymphocytes Relative 16 %   Lymphs Abs 1.6 0.7 - 4.0 K/uL   Monocytes Relative 9 %   Monocytes Absolute 1.0 0.1 - 1.0 K/uL   Eosinophils Relative 2 %   Eosinophils Absolute 0.2 0.0 - 0.7 K/uL   Basophils Relative 0 %  Basophils Absolute 0.0 0.0 - 0.1 K/uL    Comment: Performed at [facility], [place] [address] [place], [place] [address]  Type and screen [place]     Status: None   Collection Time: 01/31/18  4:24 AM  Result Value Ref Range   ABO/RH(D) O POS    Antibody Screen NEG    Sample Expiration      02/03/2018 Performed at [facility], [place] [address], [place], [place] [address]   Protime-INR     Status: None   Collection Time: 01/31/18  6:35 AM  Result Value Ref Range   Prothrombin Time 13.6 11.4 - 15.2 seconds   INR 1.05     Comment: Performed at [facility], [place] [address], [place], [place] [address]  Protime-INR     Status: None   Collection Time: 01/31/18 12:30 PM  Result Value Ref Range   Prothrombin Time 13.3 11.4 - 15.2 seconds   INR 1.02     Comment: Performed at [facility], [place] [address], [place], [place] [address]  Protime-INR     Status: None   Collection Time: 02/01/18 12:53 AM  Result Value Ref Range   Prothrombin Time 13.1 11.4 - 15.2 seconds   INR 1.00     Comment: Performed at [place] [address] [place], [place] [address]    Dg Wrist Complete Left  Result Date: 01/31/2018 CLINICAL DATA:  Fall this morning with left wrist pain. EXAM: LEFT WRIST - COMPLETE 3+ VIEW COMPARISON:  None. FINDINGS: There is no evidence of fracture or dislocation. Mild osteoarthritis throughout the carpal bones and base of the thumb.  There is chondrocalcinosis. Dorsal and ulnar soft tissue prominence and edema. IMPRESSION: 1. Soft tissue edema about the ulnar aspect of the wrist with probable hematoma. 2. No fracture or dislocation. Electronically Signed   By: Jeb Levering M.D.   On: 01/31/2018 04:40   Ct Head Wo Contrast  Result Date: 01/31/2018 CLINICAL DATA:  Fall with left cheek bruising. No loss of consciousness. EXAM: CT HEAD WITHOUT CONTRAST CT MAXILLOFACIAL WITHOUT CONTRAST CT CERVICAL SPINE WITHOUT CONTRAST TECHNIQUE: Multidetector CT imaging of the head, cervical spine, and maxillofacial structures were performed using the standard protocol without intravenous contrast. Multiplanar CT image reconstructions of the cervical spine and maxillofacial structures were also generated. COMPARISON:  Head CT 01/27/2015 FINDINGS: CT HEAD FINDINGS Brain: Faint subarachnoid hemorrhage in the left frontal lobe. Chronic cerebellar basal gangliar calcifications are unchanged from prior. No subdural or extra-axial fluid collection. Age related atrophy and chronic small vessel ischemia. No evidence of acute infarct. Vascular: Atherosclerosis of skullbase vasculature without hyperdense vessel or abnormal calcification. Skull: No fracture or focal lesion. Other: None. CT MAXILLOFACIAL FINDINGS Osseous: Zygomatic arches, nasal bones, and mandibles are intact. Temporomandibular joints are congruent. Patient is edentulous. Orbits: No orbital fracture. Both orbits and globes are intact. Bilateral cataract resection. Sinuses: Mild mucosal thickening of the ethmoid air cells and bilateral maxillary sinuses. No sinus fluid levels or sinus fracture. Soft tissues: Soft tissue edema small hematoma about the left cheek. Soft tissue edema lateral to the left orbit. CT CERVICAL SPINE FINDINGS Alignment: Straightening of normal lordosis. No traumatic subluxation. Skull base and vertebrae: No acute fracture. Vertebral body hemangioma within C7. The bones are  under mineralized. Degenerative pannus at C1-C2. Soft tissues and spinal canal: No prevertebral fluid or swelling. No visible canal hematoma. Disc levels: Diffuse disc space narrowing throughout  with endplate spurring, most prominent at C5-C6. Multilevel facet arthropathy. Upper chest: Left pleural effusion is partially included. There are ground-glass opacities in the included left lung. No visualized rib fracture. Other: Carotid calcifications. IMPRESSION: 1. Faint left frontal subarachnoid hemorrhage.  No skull fracture. 2. Left facial soft tissue edema and hematoma, no facial bone fracture. 3. Multilevel degenerative change throughout the cervical spine without acute fracture or subluxation. 4. Left pleural effusion is partially included, at least moderate in size. There are ground-glass opacities throughout the included left lung. This is age indeterminate, there is clinical concern for acute chest trauma, recommend dedicated chest CT. Critical Value/emergent results were called by telephone at the time of interpretation on 01/31/2018 at 03:49 am to Dr. Delora Fuel , who verbally acknowledged these results. Electronically Signed   By: Jeb Levering M.D.   On: 01/31/2018 04:58   Ct Cervical Spine Wo Contrast  Result Date: 01/31/2018 CLINICAL DATA:  Fall with left cheek bruising. No loss of consciousness. EXAM: CT HEAD WITHOUT CONTRAST CT MAXILLOFACIAL WITHOUT CONTRAST CT CERVICAL SPINE WITHOUT CONTRAST TECHNIQUE: Multidetector CT imaging of the head, cervical spine, and maxillofacial structures were performed using the standard protocol without intravenous contrast. Multiplanar CT image reconstructions of the cervical spine and maxillofacial structures were also generated. COMPARISON:  Head CT 01/27/2015 FINDINGS: CT HEAD FINDINGS Brain: Faint subarachnoid hemorrhage in the left frontal lobe. Chronic cerebellar basal gangliar calcifications are unchanged from prior. No subdural or extra-axial fluid  collection. Age related atrophy and chronic small vessel ischemia. No evidence of acute infarct. Vascular: Atherosclerosis of skullbase vasculature without hyperdense vessel or abnormal calcification. Skull: No fracture or focal lesion. Other: None. CT MAXILLOFACIAL FINDINGS Osseous: Zygomatic arches, nasal bones, and mandibles are intact. Temporomandibular joints are congruent. Patient is edentulous. Orbits: No orbital fracture. Both orbits and globes are intact. Bilateral cataract resection. Sinuses: Mild mucosal thickening of the ethmoid air cells and bilateral maxillary sinuses. No sinus fluid levels or sinus fracture. Soft tissues: Soft tissue edema small hematoma about the left cheek. Soft tissue edema lateral to the left orbit. CT CERVICAL SPINE FINDINGS Alignment: Straightening of normal lordosis. No traumatic subluxation. Skull base and vertebrae: No acute fracture. Vertebral body hemangioma within C7. The bones are under mineralized. Degenerative pannus at C1-C2. Soft tissues and spinal canal: No prevertebral fluid or swelling. No visible canal hematoma. Disc levels: Diffuse disc space narrowing throughout with endplate spurring, most prominent at C5-C6. Multilevel facet arthropathy. Upper chest: Left pleural effusion is partially included. There are ground-glass opacities in the included left lung. No visualized rib fracture. Other: Carotid calcifications. IMPRESSION: 1. Faint left frontal subarachnoid hemorrhage.  No skull fracture. 2. Left facial soft tissue edema and hematoma, no facial bone fracture. 3. Multilevel degenerative change throughout the cervical spine without acute fracture or subluxation. 4. Left pleural effusion is partially included, at least moderate in size. There are ground-glass opacities throughout the included left lung. This is age indeterminate, there is clinical concern for acute chest trauma, recommend dedicated chest CT. Critical Value/emergent results were called by  telephone at the time of interpretation on 01/31/2018 at 03:49 am to Dr. Delora Fuel , who verbally acknowledged these results. Electronically Signed   By: Jeb Levering M.D.   On: 01/31/2018 04:58   Dg Chest Port 1 View  Result Date: 01/31/2018 CLINICAL DATA:  Chest congestion. EXAM: PORTABLE CHEST 1 VIEW COMPARISON:  Radiographs 08/15/2016.  Chest CT 05/19/2016 FINDINGS: Left-sided pacemaker in place. Opacification of the lower 2/3 of  left hemithorax is likely secondary to hiatal and paraesophageal hernia, which on prior CT contained stomach and splenic flexure of the colon. Heart size difficult to assess due to large hiatal hernia. There is aortic atherosclerosis. Mild vascular congestion. Left pleural effusion on prior cervical spine CT is not well demonstrated on the current exam. No pneumothorax. Visualized right lung is clear. IMPRESSION: 1. Opacification of lower 2/3 of left hemithorax is likely secondary to large hiatal/paraesophageal hernia containing stomach and splenic flexure of the colon, as seen on prior CT. Left pleural effusion on cervical spine CT is not well demonstrated on the current exam. 2. Heart size difficult to assess due to large hiatal hernia. Aortic atherosclerosis. 3. Mild vascular congestion. Electronically Signed   By: Jeb Levering M.D.   On: 01/31/2018 06:07   Dg Knee Complete 4 Views Left  Result Date: 01/31/2018 CLINICAL DATA:  Left knee pain after fall. EXAM: LEFT KNEE - COMPLETE 4+ VIEW COMPARISON:  None. FINDINGS: No evidence of fracture, dislocation, or joint effusion. No evidence of arthropathy or other focal bone abnormality. Presumed varicosities medially. There are vascular calcifications. IMPRESSION: No fracture, dislocation, or joint effusion. Electronically Signed   By: Jeb Levering M.D.   On: 01/31/2018 04:36   Ct Maxillofacial Wo Contrast  Result Date: 01/31/2018 CLINICAL DATA:  Fall with left cheek bruising. No loss of consciousness. EXAM: CT  HEAD WITHOUT CONTRAST CT MAXILLOFACIAL WITHOUT CONTRAST CT CERVICAL SPINE WITHOUT CONTRAST TECHNIQUE: Multidetector CT imaging of the head, cervical spine, and maxillofacial structures were performed using the standard protocol without intravenous contrast. Multiplanar CT image reconstructions of the cervical spine and maxillofacial structures were also generated. COMPARISON:  Head CT 01/27/2015 FINDINGS: CT HEAD FINDINGS Brain: Faint subarachnoid hemorrhage in the left frontal lobe. Chronic cerebellar basal gangliar calcifications are unchanged from prior. No subdural or extra-axial fluid collection. Age related atrophy and chronic small vessel ischemia. No evidence of acute infarct. Vascular: Atherosclerosis of skullbase vasculature without hyperdense vessel or abnormal calcification. Skull: No fracture or focal lesion. Other: None. CT MAXILLOFACIAL FINDINGS Osseous: Zygomatic arches, nasal bones, and mandibles are intact. Temporomandibular joints are congruent. Patient is edentulous. Orbits: No orbital fracture. Both orbits and globes are intact. Bilateral cataract resection. Sinuses: Mild mucosal thickening of the ethmoid air cells and bilateral maxillary sinuses. No sinus fluid levels or sinus fracture. Soft tissues: Soft tissue edema small hematoma about the left cheek. Soft tissue edema lateral to the left orbit. CT CERVICAL SPINE FINDINGS Alignment: Straightening of normal lordosis. No traumatic subluxation. Skull base and vertebrae: No acute fracture. Vertebral body hemangioma within C7. The bones are under mineralized. Degenerative pannus at C1-C2. Soft tissues and spinal canal: No prevertebral fluid or swelling. No visible canal hematoma. Disc levels: Diffuse disc space narrowing throughout with endplate spurring, most prominent at C5-C6. Multilevel facet arthropathy. Upper chest: Left pleural effusion is partially included. There are ground-glass opacities in the included left lung. No visualized rib  fracture. Other: Carotid calcifications. IMPRESSION: 1. Faint left frontal subarachnoid hemorrhage.  No skull fracture. 2. Left facial soft tissue edema and hematoma, no facial bone fracture. 3. Multilevel degenerative change throughout the cervical spine without acute fracture or subluxation. 4. Left pleural effusion is partially included, at least moderate in size. There are ground-glass opacities throughout the included left lung. This is age indeterminate, there is clinical concern for acute chest trauma, recommend dedicated chest CT. Critical Value/emergent results were called by telephone at the time of interpretation on 01/31/2018 at 03:49 am  to Dr. Delora Fuel , who verbally acknowledged these results. Electronically Signed   By: Jeb Levering M.D.   On: 01/31/2018 04:58    ROS: As above Blood pressure (!) 160/70, pulse 78, temperature 98 F (36.7 C), temperature source Oral, resp. rate 18, height 5' 4" (1.626 m), weight 53.1 kg (117 lb), SpO2 96 %. Estimated body mass index is 20.08 kg/m as calculated from the following:   Height as of this encounter: 5' 4" (1.626 m).   Weight as of this encounter: 53.1 kg (117 lb).  Physical Exam  General: An alert and pleasant 82 year old white female in no apparent distress with some left facial bruising.  HEENT: Normocephalic, pupils equal round reactive light, extraocular muscles intact, edentulous, the patient has some left facial and upper neck bruising anteriorly.  Neck: Supple without masses or deformities.  She has an age-appropriate normal range of motion.  Spurling's testing is negative.  Thorax: Symmetric  Abdomen: Soft  Back exam: Unremarkable  Extremities: Unremarkable  Neurologic exam: The patient is alert and oriented x3.  Glasgow Coma Scale 15.  Cranial nerves II through XII were examined bilaterally grossly normal.  Vision and hearing are grossly normal bilaterally.  Her motor strength is 5/5 in her bilateral biceps, triceps,  handgrip, gastrocnemius, and dorsiflexors.  Sensory function is intact to light touch sensation all tested dermatomes bilaterally.  Cerebellar function is intact to rapid alternating movements of the upper extremities bilaterally.  Imaging studies: I have reviewed the patient's head CT performed at [facility] yesterday.  It demonstrates a minuscule left frontal subarachnoid hemorrhage.  Assessment/Plan: Tiny traumatic subarachnoid hemorrhage: Since the patient was on Coumadin I will plan to repeat her CAT scan.  If the CAT scan demonstrates no additional bleeding then she can be discharged to home from my point of view.  She can follow-up with her primary doctor to discuss if and when to resume her Coumadin, but at the very least I would not resume it for a few days.  Anne Anthony 02/01/2018, 7:32 AM

## 2018-02-01 NOTE — Progress Notes (Signed)
Pt d/c home, no new concerns, pt is stable. D/c instructions done with teach back, pt and family verbalize understanding . Pt is transported out of hospital by family

## 2018-02-01 NOTE — Progress Notes (Signed)
Patients BP was 163/70 will gave her 5 mg of hydralazine and recheck.

## 2018-02-01 NOTE — Care Management CC44 (Signed)
Condition Code 44 Documentation Completed  Patient Details  Name: Anne Anthony MRN: [id number] Date of Birth: [birth year]   Condition Code 44 given:  Yes Patient signature on Condition Code 44 notice:  Yes Documentation of 2 MD's agreement:  Yes Code 44 added to claim:  Yes    Carles Collet, RN 02/01/2018, 2:10 PM

## 2018-02-01 NOTE — Discharge Summary (Signed)
Physician Discharge Summary  Anne Anthony:[id number] DOB: 20-Dec-1935 DOA: 01/31/2018  PCP: Sharilyn Sites, MD  Admit date: 01/31/2018 Discharge date: 02/01/2018  Recommendations for Outpatient Follow-up:   Traumatic SAH complicated by warfarin-induced coagulopathy.  --Can resume anticoagulation in 1 week.   Follow-up Information    Sharilyn Sites, MD. Schedule an appointment as soon as possible for a visit in 1 week(s).   Specialty:  Family Medicine Contact information: [address] [place] [address] [phone]        Sanda Klein, MD .   Specialty:  Cardiology Contact information: [address] [place] [address] [phone]            Discharge Diagnoses:  1. Traumatic SAH complicated by warfarin-induced coagulopathy.  2. PAF   Discharge Condition: improved Disposition: home  Diet recommendation: Heart healthy  Filed Weights   01/31/18 0406  Weight: 53.1 kg (117 lb)    History of present illness:  81yow on warfarin for afib presented with fall/[facility]. INR 5.1. CT head showed small subarachnoid hemorrhage. Treated with Kcentra in ED and d/w neurosurgery who rec transfer to [facility]. Neurosugery saw 3/22 and ordered repeat CT head.  Hospital Course:  Patient was observed, had no apparent sequela.  Today is nearly asymptomatic with only mild headache.  Repeat CT head reassuring.  Neurosurgery recommended discharge home.  Patient will follow-up as an outpatient, hold Coumadin until she sees her primary care physician.  This was discussed with both daughters at bedside.  I reviewed the chest x-ray findings with both daughters and showed in the chest x-ray.  Patient is completely asymptomatic, there is no history or clinical findings to suggest chest trauma, these abnormal findings represent known large paraesophageal hernia.  No further evaluation is suggested at this time which both daughters agreed with.  Home health was offered but  patient declined this and daughters agreed.  Traumatic SAH complicated by warfarin-induced coagulopathy.  --per neurosurgery: "If the CAT scan demonstrates no additional bleeding then she can be discharged to home from my point of view She can follow-up with her primary doctor to discuss if and when to resume her Coumadin, but at the very least I would not resume it for a few days." --repeat CT head no acute findings, SAH no longer visible.  Left pleural effusion seen on cervical CT. "There are ground-glass opacities throughout the included left lung. This is age indeterminate, there is clinical concern for acute chest trauma, recommend dedicated chest CT." CXR showed opacification of lower 2/3 of left hemithorax secondary to paraesophageal hernia, left pleural effusion was not demonstrated on CXR.  PAF on warfarin --Continue metoprolol  Essential HTN  PPM  Consultants:  Surgery   Today's assessment: S: Feels well, headache resolving.  No back pain.  Breathing well. O: Vitals:  Vitals:   02/01/18 0813 02/01/18 1135  BP: (!) 148/70 139/75  Pulse: 78 74  Resp: 16 17  Temp: 98.3 F (36.8 C) 98.4 F (36.9 C)  SpO2: 97% 96%    Constitutional:  . Appears calm and comfortable Eyes:  . pupils and irises appear normal ENMT:  . grossly normal hearing  Respiratory:  . Clear to auscultation on the right, no wheezes, rales or rhonchi.  Left posterior there are some bowel sounds and diminished breath sounds.  Lungs clear left anterior. Marland Kitchen Respiratory effort normal Cardiovascular:  . RRR, no m/r/g Skin:  . Extensive ecchymosis along the left side of face. Psychiatric:  . judgement and insight  appear normal . Mental status o Mood, affect appropriate    Discharge Instructions  Discharge Instructions    Diet - low sodium heart healthy   Complete by:  As directed    Discharge instructions   Complete by:  As directed    Hold Coumadin until you see your doctor. Call your  physician or seek immediate medical attention for bleeding, pain, SOB or worsening of condition.   Increase activity slowly   Complete by:  As directed      Allergies as of 02/01/2018      Reactions   Morphine And Related Other (See Comments)   Hallucination      Medication List    STOP taking these medications   warfarin 2 MG tablet Commonly known as:  COUMADIN     TAKE these medications   acetaminophen 325 MG tablet Commonly known as:  TYLENOL Take 1-2 tablets (325-650 mg total) by mouth every 4 (four) hours as needed for fever, headache or mild pain.   dexlansoprazole 60 MG capsule Commonly known as:  DEXILANT Take 1 capsule (60 mg total) by mouth daily.   gabapentin 300 MG capsule Commonly known as:  NEURONTIN 1 cap at bedtime What changed:    how much to take  how to take this  when to take this  additional instructions   HYDROcodone-acetaminophen 5-325 MG tablet Commonly known as:  NORCO/VICODIN Take 1 tablet by mouth every 6 (six) hours as needed for moderate pain (take 1 tablet every 4-6 hours as needed for pain).   metoprolol tartrate 25 MG tablet Commonly known as:  LOPRESSOR Take 25 mg by mouth 2 (two) times daily.   simvastatin 20 MG tablet Commonly known as:  ZOCOR Take 20 mg by mouth every evening.   trimethoprim 100 MG tablet Commonly known as:  TRIMPEX Take 100 mg by mouth daily.      Allergies  Allergen Reactions  . Morphine And Related Other (See Comments)    Hallucination    The results of significant diagnostics from this hospitalization (including imaging, microbiology, ancillary and laboratory) are listed below for reference.    Significant Diagnostic Studies: Dg Wrist Complete Left  Result Date: 01/31/2018 CLINICAL DATA:  Fall this morning with left wrist pain. EXAM: LEFT WRIST - COMPLETE 3+ VIEW COMPARISON:  None. FINDINGS: There is no evidence of fracture or dislocation. Mild osteoarthritis throughout the carpal bones and  base of the thumb. There is chondrocalcinosis. Dorsal and ulnar soft tissue prominence and edema. IMPRESSION: 1. Soft tissue edema about the ulnar aspect of the wrist with probable hematoma. 2. No fracture or dislocation. Electronically Signed   By: Jeb Levering M.D.   On: 01/31/2018 04:40   Ct Head Wo Contrast  Result Date: 02/01/2018 CLINICAL DATA:  Followup fall with intracranial hemorrhage. EXAM: CT HEAD WITHOUT CONTRAST TECHNIQUE: Contiguous axial images were obtained from the base of the skull through the vertex without intravenous contrast. COMPARISON:  01/31/2018. FINDINGS: Brain: Minimal amount of subdural blood seen in a left frontal sulcus yesterday is no longer visible. No-in all bleeding. The brain does not show accelerated atrophy. No sign of old or acute focal infarction, mass lesion, hydrocephalus or extra-axial collection. No visible intraventricular blood. Physiologic basal ganglia and cerebellar calcification appears the same. Vascular: There is atherosclerotic calcification of the major vessels at the base of the brain. Skull: No skull fracture. Sinuses/Orbits: Mild seasonal mucosal thickening affects the paranasal sinuses. Orbits negative. Other: None IMPRESSION: Tiny amount of subarachnoid  hemorrhage seen yesterday in a left frontal sulcus is no longer visible. No additional bleeding. No acute finding on today's study. Electronically Signed   By: Nelson Chimes M.D.   On: 02/01/2018 11:06   Ct Head Wo Contrast  Result Date: 01/31/2018 CLINICAL DATA:  Fall with left cheek bruising. No loss of consciousness. EXAM: CT HEAD WITHOUT CONTRAST CT MAXILLOFACIAL WITHOUT CONTRAST CT CERVICAL SPINE WITHOUT CONTRAST TECHNIQUE: Multidetector CT imaging of the head, cervical spine, and maxillofacial structures were performed using the standard protocol without intravenous contrast. Multiplanar CT image reconstructions of the cervical spine and maxillofacial structures were also generated.  COMPARISON:  Head CT 01/27/2015 FINDINGS: CT HEAD FINDINGS Brain: Faint subarachnoid hemorrhage in the left frontal lobe. Chronic cerebellar basal gangliar calcifications are unchanged from prior. No subdural or extra-axial fluid collection. Age related atrophy and chronic small vessel ischemia. No evidence of acute infarct. Vascular: Atherosclerosis of skullbase vasculature without hyperdense vessel or abnormal calcification. Skull: No fracture or focal lesion. Other: None. CT MAXILLOFACIAL FINDINGS Osseous: Zygomatic arches, nasal bones, and mandibles are intact. Temporomandibular joints are congruent. Patient is edentulous. Orbits: No orbital fracture. Both orbits and globes are intact. Bilateral cataract resection. Sinuses: Mild mucosal thickening of the ethmoid air cells and bilateral maxillary sinuses. No sinus fluid levels or sinus fracture. Soft tissues: Soft tissue edema small hematoma about the left cheek. Soft tissue edema lateral to the left orbit. CT CERVICAL SPINE FINDINGS Alignment: Straightening of normal lordosis. No traumatic subluxation. Skull base and vertebrae: No acute fracture. Vertebral body hemangioma within C7. The bones are under mineralized. Degenerative pannus at C1-C2. Soft tissues and spinal canal: No prevertebral fluid or swelling. No visible canal hematoma. Disc levels: Diffuse disc space narrowing throughout with endplate spurring, most prominent at C5-C6. Multilevel facet arthropathy. Upper chest: Left pleural effusion is partially included. There are ground-glass opacities in the included left lung. No visualized rib fracture. Other: Carotid calcifications. IMPRESSION: 1. Faint left frontal subarachnoid hemorrhage.  No skull fracture. 2. Left facial soft tissue edema and hematoma, no facial bone fracture. 3. Multilevel degenerative change throughout the cervical spine without acute fracture or subluxation. 4. Left pleural effusion is partially included, at least moderate in size.  There are ground-glass opacities throughout the included left lung. This is age indeterminate, there is clinical concern for acute chest trauma, recommend dedicated chest CT. Critical Value/emergent results were called by telephone at the time of interpretation on 01/31/2018 at 03:49 am to Dr. Delora Fuel , who verbally acknowledged these results. Electronically Signed   By: Jeb Levering M.D.   On: 01/31/2018 04:58   Ct Cervical Spine Wo Contrast  Result Date: 01/31/2018 CLINICAL DATA:  Fall with left cheek bruising. No loss of consciousness. EXAM: CT HEAD WITHOUT CONTRAST CT MAXILLOFACIAL WITHOUT CONTRAST CT CERVICAL SPINE WITHOUT CONTRAST TECHNIQUE: Multidetector CT imaging of the head, cervical spine, and maxillofacial structures were performed using the standard protocol without intravenous contrast. Multiplanar CT image reconstructions of the cervical spine and maxillofacial structures were also generated. COMPARISON:  Head CT 01/27/2015 FINDINGS: CT HEAD FINDINGS Brain: Faint subarachnoid hemorrhage in the left frontal lobe. Chronic cerebellar basal gangliar calcifications are unchanged from prior. No subdural or extra-axial fluid collection. Age related atrophy and chronic small vessel ischemia. No evidence of acute infarct. Vascular: Atherosclerosis of skullbase vasculature without hyperdense vessel or abnormal calcification. Skull: No fracture or focal lesion. Other: None. CT MAXILLOFACIAL FINDINGS Osseous: Zygomatic arches, nasal bones, and mandibles are intact. Temporomandibular joints are congruent. Patient  is edentulous. Orbits: No orbital fracture. Both orbits and globes are intact. Bilateral cataract resection. Sinuses: Mild mucosal thickening of the ethmoid air cells and bilateral maxillary sinuses. No sinus fluid levels or sinus fracture. Soft tissues: Soft tissue edema small hematoma about the left cheek. Soft tissue edema lateral to the left orbit. CT CERVICAL SPINE FINDINGS Alignment:  Straightening of normal lordosis. No traumatic subluxation. Skull base and vertebrae: No acute fracture. Vertebral body hemangioma within C7. The bones are under mineralized. Degenerative pannus at C1-C2. Soft tissues and spinal canal: No prevertebral fluid or swelling. No visible canal hematoma. Disc levels: Diffuse disc space narrowing throughout with endplate spurring, most prominent at C5-C6. Multilevel facet arthropathy. Upper chest: Left pleural effusion is partially included. There are ground-glass opacities in the included left lung. No visualized rib fracture. Other: Carotid calcifications. IMPRESSION: 1. Faint left frontal subarachnoid hemorrhage.  No skull fracture. 2. Left facial soft tissue edema and hematoma, no facial bone fracture. 3. Multilevel degenerative change throughout the cervical spine without acute fracture or subluxation. 4. Left pleural effusion is partially included, at least moderate in size. There are ground-glass opacities throughout the included left lung. This is age indeterminate, there is clinical concern for acute chest trauma, recommend dedicated chest CT. Critical Value/emergent results were called by telephone at the time of interpretation on 01/31/2018 at 03:49 am to Dr. Delora Fuel , who verbally acknowledged these results. Electronically Signed   By: Jeb Levering M.D.   On: 01/31/2018 04:58   Dg Chest Port 1 View  Result Date: 01/31/2018 CLINICAL DATA:  Chest congestion. EXAM: PORTABLE CHEST 1 VIEW COMPARISON:  Radiographs 08/15/2016.  Chest CT 05/19/2016 FINDINGS: Left-sided pacemaker in place. Opacification of the lower 2/3 of left hemithorax is likely secondary to hiatal and paraesophageal hernia, which on prior CT contained stomach and splenic flexure of the colon. Heart size difficult to assess due to large hiatal hernia. There is aortic atherosclerosis. Mild vascular congestion. Left pleural effusion on prior cervical spine CT is not well demonstrated on the  current exam. No pneumothorax. Visualized right lung is clear. IMPRESSION: 1. Opacification of lower 2/3 of left hemithorax is likely secondary to large hiatal/paraesophageal hernia containing stomach and splenic flexure of the colon, as seen on prior CT. Left pleural effusion on cervical spine CT is not well demonstrated on the current exam. 2. Heart size difficult to assess due to large hiatal hernia. Aortic atherosclerosis. 3. Mild vascular congestion. Electronically Signed   By: Jeb Levering M.D.   On: 01/31/2018 06:07   Dg Knee Complete 4 Views Left  Result Date: 01/31/2018 CLINICAL DATA:  Left knee pain after fall. EXAM: LEFT KNEE - COMPLETE 4+ VIEW COMPARISON:  None. FINDINGS: No evidence of fracture, dislocation, or joint effusion. No evidence of arthropathy or other focal bone abnormality. Presumed varicosities medially. There are vascular calcifications. IMPRESSION: No fracture, dislocation, or joint effusion. Electronically Signed   By: Jeb Levering M.D.   On: 01/31/2018 04:36   Dg Inject Diag/thera/inc Needle/cath/plc Epi/lumb/sac W/img  Result Date: 01/15/2018 CLINICAL DATA:  Chronic low back pain with left leg radiculopathy. 100% relief for several years after prior injection in October 2015. FLUOROSCOPY TIME:  Radiation Exposure Index (as provided by the fluoroscopic device): 9.76 Gy*m2 Fluoroscopy Time:  14 seconds Number of Acquired Images:  0 PROCEDURE: The procedure, risks, benefits, and alternatives were explained to the patient. Questions regarding the procedure were encouraged and answered. The patient understands and consents to the procedure. LUMBAR EPIDURAL  INJECTION: An interlaminar approach was performed on the left at L4-L5. The overlying skin was cleansed and anesthetized. A 3.5 inch 20 gauge epidural needle was advanced using loss-of-resistance technique. DIAGNOSTIC EPIDURAL INJECTION: Injection of Isovue-M 200 shows a good epidural pattern with spread above and below the  level of needle placement, primarily on the left. No vascular opacification is seen. THERAPEUTIC EPIDURAL INJECTION: 120 mg of Depo-Medrol mixed with 3 mL of 1% lidocaine were instilled. The procedure was well-tolerated, and the patient was discharged thirty minutes following the injection in good condition. COMPLICATIONS: None immediate. IMPRESSION: Technically successful epidural injection on the left at L4-L5 #1. Electronically Signed   By: Titus Dubin M.D.   On: 01/15/2018 15:50   Ct Maxillofacial Wo Contrast  Result Date: 01/31/2018 CLINICAL DATA:  Fall with left cheek bruising. No loss of consciousness. EXAM: CT HEAD WITHOUT CONTRAST CT MAXILLOFACIAL WITHOUT CONTRAST CT CERVICAL SPINE WITHOUT CONTRAST TECHNIQUE: Multidetector CT imaging of the head, cervical spine, and maxillofacial structures were performed using the standard protocol without intravenous contrast. Multiplanar CT image reconstructions of the cervical spine and maxillofacial structures were also generated. COMPARISON:  Head CT 01/27/2015 FINDINGS: CT HEAD FINDINGS Brain: Faint subarachnoid hemorrhage in the left frontal lobe. Chronic cerebellar basal gangliar calcifications are unchanged from prior. No subdural or extra-axial fluid collection. Age related atrophy and chronic small vessel ischemia. No evidence of acute infarct. Vascular: Atherosclerosis of skullbase vasculature without hyperdense vessel or abnormal calcification. Skull: No fracture or focal lesion. Other: None. CT MAXILLOFACIAL FINDINGS Osseous: Zygomatic arches, nasal bones, and mandibles are intact. Temporomandibular joints are congruent. Patient is edentulous. Orbits: No orbital fracture. Both orbits and globes are intact. Bilateral cataract resection. Sinuses: Mild mucosal thickening of the ethmoid air cells and bilateral maxillary sinuses. No sinus fluid levels or sinus fracture. Soft tissues: Soft tissue edema small hematoma about the left cheek. Soft tissue edema  lateral to the left orbit. CT CERVICAL SPINE FINDINGS Alignment: Straightening of normal lordosis. No traumatic subluxation. Skull base and vertebrae: No acute fracture. Vertebral body hemangioma within C7. The bones are under mineralized. Degenerative pannus at C1-C2. Soft tissues and spinal canal: No prevertebral fluid or swelling. No visible canal hematoma. Disc levels: Diffuse disc space narrowing throughout with endplate spurring, most prominent at C5-C6. Multilevel facet arthropathy. Upper chest: Left pleural effusion is partially included. There are ground-glass opacities in the included left lung. No visualized rib fracture. Other: Carotid calcifications. IMPRESSION: 1. Faint left frontal subarachnoid hemorrhage.  No skull fracture. 2. Left facial soft tissue edema and hematoma, no facial bone fracture. 3. Multilevel degenerative change throughout the cervical spine without acute fracture or subluxation. 4. Left pleural effusion is partially included, at least moderate in size. There are ground-glass opacities throughout the included left lung. This is age indeterminate, there is clinical concern for acute chest trauma, recommend dedicated chest CT. Critical Value/emergent results were called by telephone at the time of interpretation on 01/31/2018 at 03:49 am to Dr. Delora Fuel , who verbally acknowledged these results. Electronically Signed   By: Jeb Levering M.D.   On: 01/31/2018 04:58    Labs: Basic Metabolic Panel: Recent Labs  Lab 01/31/18 0359  NA 139  K 4.3  CL 105  CO2 25  GLUCOSE 136*  BUN 17  CREATININE 1.00  CALCIUM 8.9   CBC: Recent Labs  Lab 01/31/18 0359  WBC 10.2  NEUTROABS 7.4  HGB 13.6  HCT 42.5  MCV 107.6*  PLT 187   Principal  Problem:   SAH (subarachnoid hemorrhage) (HCC) Active Problems:   Paroxysmal atrial fibrillation (HCC)   Hypertension   Pacemaker   Anticoagulated on Coumadin   Pleural effusion on left   Time coordinating discharge: 35  minutes  Signed:  Murray Hodgkins, MD [facility] 02/01/2018, 1:53 PM

## 2018-02-01 NOTE — Care Management Note (Signed)
Case Management Note  Patient Details  Name: Anne Anthony MRN: [id number] Date of Birth: October 25, 1936  Subjective/Objective:                    Action/Plan: Pt discharging home with self care. Pt has PCP, insurance and transportation home.    Expected Discharge Date:  02/01/18               Expected Discharge Plan:  Home/Self Care  In-House Referral:     Discharge planning Services     Post Acute Care Choice:    Choice offered to:     DME Arranged:    DME Agency:     HH Arranged:    HH Agency:     Status of Service:  Completed, signed off  If discussed at [employer] of Stay Meetings, dates discussed:    Additional Comments:  Pollie Friar, RN 02/01/2018, 4:30 PM

## 2018-02-01 NOTE — Care Management Obs Status (Signed)
[facility] NOTIFICATION   Patient Details  Name: Anne Anthony MRN: [id number] Date of Birth: 12/16/1935   Medicare Observation Status Notification Given:  Yes    Carles Collet, RN 02/01/2018, 2:10 PM

## 2018-02-07 ENCOUNTER — Ambulatory Visit: Payer: Medicare Other | Admitting: Cardiovascular Disease

## 2018-02-07 ENCOUNTER — Encounter: Payer: Self-pay | Admitting: Cardiovascular Disease

## 2018-02-07 VITALS — BP 116/60 | HR 75 | Ht 64.0 in | Wt 119.4 lb

## 2018-02-07 DIAGNOSIS — Z950 Presence of cardiac pacemaker: Secondary | ICD-10-CM

## 2018-02-07 DIAGNOSIS — E7800 Pure hypercholesterolemia, unspecified: Secondary | ICD-10-CM

## 2018-02-07 DIAGNOSIS — R942 Abnormal results of pulmonary function studies: Secondary | ICD-10-CM

## 2018-02-07 DIAGNOSIS — I10 Essential (primary) hypertension: Secondary | ICD-10-CM

## 2018-02-07 DIAGNOSIS — I495 Sick sinus syndrome: Secondary | ICD-10-CM | POA: Diagnosis not present

## 2018-02-07 DIAGNOSIS — I480 Paroxysmal atrial fibrillation: Secondary | ICD-10-CM | POA: Diagnosis not present

## 2018-02-07 NOTE — Progress Notes (Signed)
Patient ID: Anne Anthony, female   DOB: [birth year], 82 y.o.   MRN: [id number]    Cardiology Office Note    Date:  02/09/2018   ID:  Anne Anthony, DOB Dec 01, 1935, MRN [id number]  PCP:  Sharilyn Sites, MD  Cardiologist:   Sanda Klein, MD   No chief complaint on file.   History of Present Illness:  Anne Anthony is a 82 y.o. female with long-standing paroxysmal atrial fibrillation and symptomatic sinus bradycardia, dual chamber Medtronic pacemaker implanted in 2012, hypertension and hyperlipidemia returns in follow-up.  Just last week she had a very serious fall.  She had substantial head impact and a small traumatic subarachnoid hemorrhage as well as a large ecchymosis over her face, left side of her neck and left upper extremity.  Her INR was supratherapeutic at 5.1.  She did receive K Centra.  She seems to have recovered, and fully without serious sequelae.  The recommendation was to restart her anticoagulation 1 week after released from the hospital, but I think the risk of the medication is now excessive.  Interrogation of her pacemaker shows extremely infrequent episodes of atrial fibrillation.  Only one episode was possibly atrial fibrillation but it lasted for only 43 seconds.  Mostly she has brief bursts of paroxysmal atrial tachycardia that only last for a few seconds.  Otherwise pacemaker interrogation shows normal device function.  Estimated generator longevity is 8 years.  She has 98% atrial pacing and only 0.4% ventricular pacing.  Amiodarone was discontinued roughly 2 years ago due to worries about restrictive changes on pulmonary function tests.  She has a history of remote successful ablation for atrial flutter, but then in 2015 had atrial fibrillation rapid ventricular response when she underwent surgical repair of a paraesophageal hernia.  Her pacemaker has not shown any lengthy episodes of atrial fibrillation in years.  She has little in the way of structural heart disease  to predispose for atrial fibrillation recurrence.  The patient specifically denies any chest pain at rest exertion, dyspnea at rest or with exertion, orthopnea, paroxysmal nocturnal dyspnea, syncope, palpitations, focal neurological deficits, intermittent claudication, lower extremity edema, unexplained weight gain, cough, hemoptysis or wheezing.  Chest CT performed  July 2017 shows a large hiatal hernia with herniation of the splenic flexure of the colon and several small bowel loops into the chest. She does have bilateral compressive atelectasis in both lung bases. Pulmonary function tests 2017 show significant restrictive ventilatory defect with FVC that is 50% of predicted and a commensurate reduction in FEV1 and diffusion capacity . Her chest x-ray last performed in December did not show focal infiltrates but did confirm the presence of a large hiatal hernia with intrathoracic stomach gas.   Past Medical History:  Diagnosis Date  . Atrial flutter ([place])    successful radiofrequency ablation 2007  . Diverticula of colon   . Family history of colon cancer    brother  . Hiatal hernia   . Hyperlipidemia   . Hypertension   . Pacemaker   . Paroxysmal atrial fibrillation (HCC)   . Severe protein-calorie malnutrition ([place]) 11/17/2014  . Tachycardia-bradycardia syndrome ([place])    permament pacermaker, dual chamber Medtronic on 06/2011  . Tubular adenoma     Past Surgical History:  Procedure Laterality Date  . ABDOMINAL SURGERY    . COLONOSCOPY  08/17/2010   Dr. Gala Romney- internal hemorrhoids, o/w normal rectum, L sided diverticula, tubular adenoma  . ESOPHAGOGASTRODUODENOSCOPY  10/502011   Dr. Gala Romney- huge diaphragmatic/hiatial  hernia, fundal gland polyps not manipulated o/w noral appearing gastric mucosa, patent pylorus, normal D1 & D2  . HIATAL HERNIA REPAIR  2005  . HIP ARTHROPLASTY Left 11/16/2014   Procedure: ARTHROPLASTY BIPOLAR HIP;  Surgeon: Mauri Pole, MD;  Location: [facility];  Service:  Orthopedics;  Laterality: Left;  . PARTIAL HYSTERECTOMY    . permament pacemaker  06/2011   dual chamber , Medtronic Adapta serial # [id number]  last checked 02/05/2013  . RADIOFREQUENCY ABLATION  06/28/2006    Outpatient Medications Prior to Visit  Medication Sig Dispense Refill  . acetaminophen (TYLENOL) 325 MG tablet Take 1-2 tablets (325-650 mg total) by mouth every 4 (four) hours as needed for fever, headache or mild pain.    Marland Kitchen dexlansoprazole (DEXILANT) 60 MG capsule Take 1 capsule (60 mg total) by mouth daily. 20 capsule 0  . gabapentin (NEURONTIN) 300 MG capsule 1 cap at bedtime (Patient taking differently: Take 300 mg by mouth 2 (two) times daily. 1 cap at bedtime) 90 capsule 0  . HYDROcodone-acetaminophen (NORCO/VICODIN) 5-325 MG tablet Take 1 tablet by mouth every 6 (six) hours as needed for moderate pain (take 1 tablet every 4-6 hours as needed for pain).    . metoprolol tartrate (LOPRESSOR) 25 MG tablet Take 25 mg by mouth 2 (two) times daily.    . simvastatin (ZOCOR) 20 MG tablet Take 20 mg by mouth every evening.    . trimethoprim (TRIMPEX) 100 MG tablet Take 100 mg by mouth daily.     No facility-administered medications prior to visit.      Allergies:   Morphine and related   Social History   Socioeconomic History  . Marital status: Widowed    Spouse name: Not on file  . Number of children: 4  . Years of education: Not on file  . Highest education level: Not on file  Occupational History  . Not on file  Social Needs  . Financial resource strain: Not on file  . Food insecurity:    Worry: Not on file    Inability: Not on file  . Transportation needs:    Medical: Not on file    Non-medical: Not on file  Tobacco Use  . Smoking status: Never Smoker  . Smokeless tobacco: Never Used  Substance and Sexual Activity  . Alcohol use: No  . Drug use: No  . Sexual activity: Not on file  Lifestyle  . Physical activity:    Days per week: Not on file    Minutes per  session: Not on file  . Stress: Not on file  Relationships  . Social connections:    Talks on phone: Not on file    Gets together: Not on file    Attends religious service: Not on file    Active member of club or organization: Not on file    Attends meetings of clubs or organizations: Not on file    Relationship status: Not on file  Other Topics Concern  . Not on file  Social History Narrative  . Not on file     Family History:  The patient's family history includes Diabetes in her mother.   ROS:   Please see the history of present illness.    ROS All other systems reviewed and are negative.   PHYSICAL EXAM:   VS:  BP 116/60   Pulse 75   Ht 5\' 4"  (1.626 m)   Wt 119 lb 6.4 oz (54.2 kg)   BMI 20.49 kg/m  General: Alert, oriented x3, no distress, large area of ecchymosis covering the left side of her face, neck, left shoulder and arm and left lateral thorax. Head:  PERRL, EOMI, no exophtalmos or lid lag, no myxedema, no xanthelasma; normal ears, nose and oropharynx Neck: normal jugular venous pulsations and no hepatojugular reflux; brisk carotid pulses without delay and no carotid bruits Chest: clear to auscultation, no signs of consolidation by percussion or palpation, normal fremitus, symmetrical and full respiratory excursions Cardiovascular: normal position and quality of the apical impulse, regular rhythm, normal first and second heart sounds, no murmurs, rubs or gallops Abdomen: no tenderness or distention, no masses by palpation, no abnormal pulsatility or arterial bruits, normal bowel sounds, no hepatosplenomegaly Extremities: no clubbing, cyanosis or edema; 2+ radial, ulnar and brachial pulses bilaterally; 2+ right femoral, posterior tibial and dorsalis pedis pulses; 2+ left femoral, posterior tibial and dorsalis pedis pulses; no subclavian or femoral bruits Neurological: grossly nonfocal Psych: Normal mood and affect   Wt Readings from Last 3 Encounters:    02/07/18 119 lb 6.4 oz (54.2 kg)  01/31/18 117 lb (53.1 kg)  01/11/18 120 lb (54.4 kg)      Studies/Labs Reviewed:   EKG:  EKG is ordered today. She was atrial paced, ventricular sensed rhythm with nonspecific T-wave flattening in leads 1 and aVL, normal QTC 423 ms  BMET    Component Value Date/Time   NA 139 01/31/2018 0359   K 4.3 01/31/2018 0359   CL 105 01/31/2018 0359   CO2 25 01/31/2018 0359   GLUCOSE 136 (H) 01/31/2018 0359   BUN 17 01/31/2018 0359   CREATININE 1.00 01/31/2018 0359   CALCIUM 8.9 01/31/2018 0359   GFRNONAA 52 (L) 01/31/2018 0359   GFRAA >60 01/31/2018 0359     ASSESSMENT:    1. Paroxysmal atrial fibrillation (HCC)   2. Essential hypertension   3. Tachycardia-bradycardia syndrome ([place])   4. Pacemaker   5. Hypercholesterolemia   6. Restrictive ventilatory defect      PLAN:  In order of problems listed above:  1. AFib: She is not really had any significant episodes of atrial fibrillation except for the perioperative event in 2015.  I think the risks of anticoagulation, as evidenced by her recent fall and head injury, outweigh the benefits.  We will discontinue the warfarin permanently.  May need to reconsider anticoagulation if she develops any lengthy episodes of atrial fibrillation and/or focal neurological events.  Currently, CHADSVasc 4 split (age 17, gender, HTN). 2. HTN: Well controlled 3. SSS: She has virtually 100% atrial pacing with satisfactory heart rate histogram based on current sensor settings. 4. PM: She does not like to do remote downloads.  We will schedule for an office visit in 6 months 5. HLP: On statin 6. Restrictive ventilatory defect: This was probably not related to amiodarone, but more likely due to the very large diaphragmatic hernia.   Medication Adjustments/Labs and Tests Ordered: Current medicines are reviewed at length with the patient today.  Concerns regarding medicines are outlined above.  Medication changes, Labs  and Tests ordered today are listed in the Patient Instructions below. Patient Instructions  Dr Sallyanne Kuster has recommended making the following medication changes: 1. STOP Coumadin 2. START Aspirin 81 mg daily ON MAY 1st  Remote monitoring is used to monitor your Pacemaker or ICD from home. This monitoring reduces the number of office visits required to check your device to one time per year. It allows [place] to keep an eye on the  functioning of your device to ensure it is working properly. You are scheduled for a device check from home on Thursday, June 27th, 2019. You may send your transmission at any time that day. If you have a wireless device, the transmission will be sent automatically. After your physician reviews your transmission, you will receive a notification with your next transmission date.  Dr Sallyanne Kuster recommends that you schedule a follow-up appointment in 12 months with a pacemaker check. You will receive a reminder letter in the mail two months in advance. If you don't receive a letter, please call our office to schedule the follow-up appointment.  If you need a refill on your cardiac medications before your next appointment, please call your pharmacy.      Signed, Sanda Klein, MD  02/09/2018 1:28 PM    [facility] [address], [place], [place]  [address] Phone: [phone]; Fax: [phone]

## 2018-02-07 NOTE — Patient Instructions (Signed)
Dr Sallyanne Kuster has recommended making the following medication changes: 1. STOP Coumadin 2. START Aspirin 81 mg daily ON MAY 1st  Remote monitoring is used to monitor your Pacemaker or ICD from home. This monitoring reduces the number of office visits required to check your device to one time per year. It allows [place] to keep an eye on the functioning of your device to ensure it is working properly. You are scheduled for a device check from home on Thursday, June 27th, 2019. You may send your transmission at any time that day. If you have a wireless device, the transmission will be sent automatically. After your physician reviews your transmission, you will receive a notification with your next transmission date.  Dr Sallyanne Kuster recommends that you schedule a follow-up appointment in 12 months with a pacemaker check. You will receive a reminder letter in the mail two months in advance. If you don't receive a letter, please call our office to schedule the follow-up appointment.  If you need a refill on your cardiac medications before your next appointment, please call your pharmacy.

## 2018-03-04 LAB — CUP PACEART INCLINIC DEVICE CHECK
Battery Impedance: 478 Ohm
Battery Voltage: 2.78 V
Brady Statistic AP VS Percent: 98 %
Brady Statistic AS VS Percent: 2 %
Date Time Interrogation Session: 20190328151426
Implantable Lead Implant Date: 20120816
Implantable Lead Location: 753859
Implantable Lead Location: 753860
Implantable Lead Model: 5076
Implantable Pulse Generator Implant Date: 20120816
Lead Channel Pacing Threshold Pulse Width: 0.4 ms
Lead Channel Pacing Threshold Pulse Width: 0.4 ms
Lead Channel Setting Pacing Amplitude: 2 V
Lead Channel Setting Pacing Pulse Width: 0.4 ms
Lead Channel Setting Sensing Sensitivity: 4 mV
MDC IDC LEAD IMPLANT DT: 20120816
MDC IDC MSMT BATTERY REMAINING LONGEVITY: 99 mo
MDC IDC MSMT LEADCHNL RA IMPEDANCE VALUE: 523 Ohm
MDC IDC MSMT LEADCHNL RA PACING THRESHOLD AMPLITUDE: 0.625 V
MDC IDC MSMT LEADCHNL RV IMPEDANCE VALUE: 580 Ohm
MDC IDC MSMT LEADCHNL RV PACING THRESHOLD AMPLITUDE: 0.625 V
MDC IDC SET LEADCHNL RA PACING AMPLITUDE: 1.5 V
MDC IDC STAT BRADY AP VP PERCENT: 0 %
MDC IDC STAT BRADY AS VP PERCENT: 0 %

## 2018-03-15 ENCOUNTER — Encounter: Payer: Medicare Other | Admitting: Cardiovascular Disease

## 2018-04-11 ENCOUNTER — Other Ambulatory Visit: Payer: Self-pay | Admitting: Family Medicine

## 2018-04-11 DIAGNOSIS — M5136 Other intervertebral disc degeneration, lumbar region: Secondary | ICD-10-CM

## 2018-04-17 ENCOUNTER — Ambulatory Visit
Admission: RE | Admit: 2018-04-17 | Discharge: 2018-04-17 | Disposition: A | Discharge status: Home / Self Care | Payer: Medicare Other | Source: Ambulatory Visit | Attending: Family Medicine | Admitting: Family Medicine

## 2018-04-17 ENCOUNTER — Other Ambulatory Visit: Payer: Self-pay | Admitting: Family Medicine

## 2018-04-17 DIAGNOSIS — M5136 Other intervertebral disc degeneration, lumbar region: Secondary | ICD-10-CM

## 2018-04-17 MED ORDER — METHYLPREDNISOLONE ACETATE 40 MG/ML INJ SUSP (RADIOLOG
120.0000 mg | Freq: Once | INTRAMUSCULAR | Status: AC
  Administered 2018-04-17: 120 mg via EPIDURAL

## 2018-04-17 MED ORDER — IOPAMIDOL (ISOVUE-M 200) INJECTION 41%
1.0000 mL | Freq: Once | INTRAMUSCULAR | Status: AC
  Administered 2018-04-17: 1 mL via EPIDURAL

## 2018-04-17 NOTE — Discharge Instructions (Signed)

## 2018-05-09 ENCOUNTER — Encounter: Payer: Medicare Other | Admitting: *Deleted

## 2018-05-09 ENCOUNTER — Telehealth: Payer: Self-pay

## 2018-05-09 NOTE — Telephone Encounter (Signed)
Attempted to confirm remote transmission with pt. No answer and was unable to leave a message.   

## 2018-05-13 ENCOUNTER — Encounter: Payer: Self-pay | Admitting: Cardiology

## 2018-06-07 ENCOUNTER — Encounter: Payer: Self-pay | Admitting: Cardiology

## 2018-07-22 ENCOUNTER — Other Ambulatory Visit: Payer: Self-pay | Admitting: Gastroenterology

## 2018-08-20 ENCOUNTER — Ambulatory Visit: Payer: Self-pay | Admitting: Pharmacist Clinician (PhC)/ Clinical Pharmacy Specialist

## 2018-08-20 DIAGNOSIS — Z7901 Long term (current) use of anticoagulants: Secondary | ICD-10-CM

## 2018-08-22 ENCOUNTER — Other Ambulatory Visit: Payer: Self-pay | Admitting: Family Medicine

## 2018-08-22 DIAGNOSIS — M5432 Sciatica, left side: Secondary | ICD-10-CM

## 2018-09-03 ENCOUNTER — Ambulatory Visit
Admission: RE | Admit: 2018-09-03 | Discharge: 2018-09-03 | Disposition: A | Discharge status: Home / Self Care | Payer: Medicare Other | Source: Ambulatory Visit | Attending: Family Medicine | Admitting: Family Medicine

## 2018-09-03 DIAGNOSIS — M5432 Sciatica, left side: Secondary | ICD-10-CM

## 2018-09-03 MED ORDER — METHYLPREDNISOLONE ACETATE 40 MG/ML INJ SUSP (RADIOLOG
120.0000 mg | Freq: Once | INTRAMUSCULAR | Status: AC
  Administered 2018-09-03: 120 mg via EPIDURAL

## 2018-09-03 MED ORDER — IOPAMIDOL (ISOVUE-M 200) INJECTION 41%
1.0000 mL | Freq: Once | INTRAMUSCULAR | Status: AC
  Administered 2018-09-03: 1 mL via INTRA_ARTICULAR

## 2018-09-05 ENCOUNTER — Encounter: Payer: Self-pay | Admitting: Cardiology

## 2018-09-29 ENCOUNTER — Other Ambulatory Visit: Payer: Self-pay

## 2018-09-29 ENCOUNTER — Emergency Department (HOSPITAL_COMMUNITY)
Admission: EM | Admit: 2018-09-29 | Discharge: 2018-09-29 | Disposition: A | Discharge status: Home / Self Care | Payer: Medicare Other | Attending: Emergency Medicine | Admitting: Emergency Medicine

## 2018-09-29 ENCOUNTER — Emergency Department (HOSPITAL_COMMUNITY): Payer: Medicare Other

## 2018-09-29 DIAGNOSIS — Y939 Activity, unspecified: Secondary | ICD-10-CM | POA: Diagnosis not present

## 2018-09-29 DIAGNOSIS — Z950 Presence of cardiac pacemaker: Secondary | ICD-10-CM | POA: Insufficient documentation

## 2018-09-29 DIAGNOSIS — Y998 Other external cause status: Secondary | ICD-10-CM | POA: Diagnosis not present

## 2018-09-29 DIAGNOSIS — W19XXXA Unspecified fall, initial encounter: Secondary | ICD-10-CM

## 2018-09-29 DIAGNOSIS — S0990XA Unspecified injury of head, initial encounter: Secondary | ICD-10-CM | POA: Insufficient documentation

## 2018-09-29 DIAGNOSIS — I480 Paroxysmal atrial fibrillation: Secondary | ICD-10-CM | POA: Insufficient documentation

## 2018-09-29 DIAGNOSIS — E785 Hyperlipidemia, unspecified: Secondary | ICD-10-CM | POA: Insufficient documentation

## 2018-09-29 DIAGNOSIS — W010XXA Fall on same level from slipping, tripping and stumbling without subsequent striking against object, initial encounter: Secondary | ICD-10-CM | POA: Diagnosis not present

## 2018-09-29 DIAGNOSIS — Z7982 Long term (current) use of aspirin: Secondary | ICD-10-CM | POA: Diagnosis not present

## 2018-09-29 DIAGNOSIS — Y929 Unspecified place or not applicable: Secondary | ICD-10-CM | POA: Diagnosis not present

## 2018-09-29 DIAGNOSIS — Z79899 Other long term (current) drug therapy: Secondary | ICD-10-CM | POA: Insufficient documentation

## 2018-09-29 DIAGNOSIS — I10 Essential (primary) hypertension: Secondary | ICD-10-CM | POA: Insufficient documentation

## 2018-09-29 NOTE — ED Notes (Signed)
Bed: WA04 Expected date:  Expected time:  Means of arrival:  Comments: 

## 2018-09-29 NOTE — ED Provider Notes (Signed)
[facility] DEPT Provider Note   CSN: [id number] Arrival date & time: 09/29/18  1027     History   Chief Complaint Chief Complaint  Patient presents with  . Fall    head injury     HPI Anne Anthony is a 82 y.o. female.  82 yo F with a chief complaint of a fall.  The patient states that she was walking with her walker she turned too quickly lost her balance and fell.  Struck the back of her head on the ground.  She denies other injury.  She thinks maybe she lost consciousness.  Has a history of an intracranial hemorrhage post fall about 8 months ago.  She denies unilateral numbness or weakness denies vomiting.  Denies chest pain or shortness of breath denies abdominal pain denies back pain denies neck pain.  Ambulatory after the event.  The history is provided by the patient.  Fall  This is a recurrent problem. The current episode started less than 1 hour ago. The problem occurs constantly. The problem has not changed since onset.Associated symptoms include headaches. Pertinent negatives include no chest pain and no shortness of breath. Nothing aggravates the symptoms. Nothing relieves the symptoms. She has tried nothing for the symptoms. The treatment provided no relief.    Past Medical History:  Diagnosis Date  . Atrial flutter ([place])    successful radiofrequency ablation 2007  . Diverticula of colon   . Family history of colon cancer    brother  . Hiatal hernia   . Hyperlipidemia   . Hypertension   . Pacemaker   . Paroxysmal atrial fibrillation (HCC)   . Severe protein-calorie malnutrition ([place]) 11/17/2014  . Tachycardia-bradycardia syndrome ([place])    permament pacermaker, dual chamber Medtronic on 06/2011  . Tubular adenoma     Patient Active Problem List   Diagnosis Date Noted  . Supratherapeutic INR   . SAH (subarachnoid hemorrhage) ([place]) 01/31/2018  . Pleural effusion on left 01/31/2018  . Restrictive lung disease 05/24/2017  .  Restrictive ventilatory defect 04/28/2016  . Nasal congestion 12/01/2014  . Postoperative anemia due to acute blood loss 11/23/2014  . Protein-calorie malnutrition, severe ([place]) 11/17/2014  . Severe protein-calorie malnutrition ([place]) 11/17/2014  . Anticoagulated on Coumadin   . Left displaced femoral neck fracture ([place])   . Hip fracture ([place]) 11/14/2014  . Closed left hip fracture ([place]) 11/14/2014  . Back pain 05/14/2014  . Paroxysmal atrial fibrillation (HCC)   . Tachycardia-bradycardia syndrome ([place])   . Hypertension   . Hyperlipidemia   . Pacemaker   . GERD 08/17/2010    Past Surgical History:  Procedure Laterality Date  . ABDOMINAL SURGERY    . COLONOSCOPY  08/17/2010   Dr. Gala Romney- internal hemorrhoids, o/w normal rectum, L sided diverticula, tubular adenoma  . ESOPHAGOGASTRODUODENOSCOPY  10/502011   Dr. Gala Romney- huge diaphragmatic/hiatial hernia, fundal gland polyps not manipulated o/w noral appearing gastric mucosa, patent pylorus, normal D1 & D2  . HIATAL HERNIA REPAIR  2005  . HIP ARTHROPLASTY Left 11/16/2014   Procedure: ARTHROPLASTY BIPOLAR HIP;  Surgeon: Mauri Pole, MD;  Location: [facility];  Service: Orthopedics;  Laterality: Left;  . PARTIAL HYSTERECTOMY    . permament pacemaker  06/2011   dual chamber , Medtronic Adapta serial # [id number]  last checked 02/05/2013  . RADIOFREQUENCY ABLATION  06/28/2006     OB History   None      Home Medications    Prior to Admission medications  Medication Sig Start Date End Date Taking? Authorizing Provider  acetaminophen (TYLENOL) 325 MG tablet Take 1-2 tablets (325-650 mg total) by mouth every 4 (four) hours as needed for fever, headache or mild pain. 11/18/14  Yes Danae Orleans, PA-C  aspirin EC 81 MG tablet Take 81 mg by mouth at bedtime.   Yes [provider]  dexlansoprazole (DEXILANT) 60 MG capsule Take 1 capsule (60 mg total) by mouth daily. 09/14/17  Yes Croitoru, Mihai, MD  escitalopram (LEXAPRO) 10 MG  tablet Take 5 mg by mouth daily. 09/06/18  Yes [provider]  gabapentin (NEURONTIN) 300 MG capsule 1 cap at bedtime Patient taking differently: Take 300 mg by mouth 2 (two) times daily. 1 cap at bedtime 06/28/15  Yes Cameron Sprang, MD  metoprolol tartrate (LOPRESSOR) 25 MG tablet Take 25 mg by mouth 2 (two) times daily. 10/02/14  Yes [provider]  simvastatin (ZOCOR) 20 MG tablet Take 20 mg by mouth every evening.   Yes [provider]  trimethoprim (TRIMPEX) 100 MG tablet Take 100 mg by mouth daily. 03/22/16  Yes [provider]  DEXILANT 60 MG capsule TAKE 1 CAPSULE BY MOUTH EVERY DAY Patient not taking: Reported on 09/29/2018 07/24/18   Annitta Needs, NP    Family History Family History  Problem Relation Age of Onset  . Diabetes Mother     Social History Social History   Tobacco Use  . Smoking status: Never Smoker  . Smokeless tobacco: Never Used  Substance Use Topics  . Alcohol use: No  . Drug use: No     Allergies   Morphine and related   Review of Systems Review of Systems  Constitutional: Negative for chills and fever.  HENT: Negative for congestion and rhinorrhea.   Eyes: Negative for redness and visual disturbance.  Respiratory: Negative for shortness of breath and wheezing.   Cardiovascular: Negative for chest pain and palpitations.  Gastrointestinal: Negative for nausea and vomiting.  Genitourinary: Negative for dysuria and urgency.  Musculoskeletal: Negative for arthralgias and myalgias.  Skin: Negative for pallor and wound.  Neurological: Positive for headaches. Negative for dizziness.     Physical Exam Updated Vital Signs BP 135/72 (BP Location: Left Arm)   Pulse 70 Comment: BP 149/73                                Resp 17  Temp 98 F (36.7 C) (Oral)   Resp 16   Ht 5\' 4"  (1.626 m)   Wt 53.1 kg   SpO2 92% Comment: BP 149/73                                Resp 17  BMI 20.08 kg/m   Physical Exam    Constitutional: She is oriented to person, place, and time. She appears well-developed and well-nourished. No distress.  HENT:  Head: Normocephalic.  Small occipital hematoma  Eyes: Pupils are equal, round, and reactive to light. EOM are normal.  Neck: Normal range of motion. Neck supple.  Cardiovascular: Normal rate and regular rhythm. Exam reveals no gallop and no friction rub.  No murmur heard. Pulmonary/Chest: Effort normal. She has no wheezes. She has no rales.  Abdominal: Soft. She exhibits no distension. There is no tenderness.  Musculoskeletal: She exhibits no edema or tenderness.  Palpated from head to toe without any noted  areas of bony tenderness.  Neurological: She is alert and oriented to person, place, and time.  Skin: Skin is warm and dry. She is not diaphoretic.  Psychiatric: She has a normal mood and affect. Her behavior is normal.  Nursing note and vitals reviewed.    ED Treatments / Results  Labs (all labs ordered are listed, but only abnormal results are displayed) Labs Reviewed - No data to display  EKG None  Radiology Ct Head Wo Contrast  Result Date: 09/29/2018 CLINICAL DATA:  [address] and hit the back of her head on the floor. The patient takes an 81 mg aspirin daily. Previous intracranial hemorrhage while taking warfarin. EXAM: CT HEAD WITHOUT CONTRAST TECHNIQUE: Contiguous axial images were obtained from the base of the skull through the vertex without intravenous contrast. COMPARISON:  02/01/2018. FINDINGS: Brain: Stable mildly enlarged ventricles and cortical sulci. Stable minimal patchy white matter low density in both cerebral hemispheres. No intracranial hemorrhage, mass lesion or CT evidence of acute infarction. Vascular: Choose 1 Skull: Mild bilateral hyperostosis frontalis. No fracture. Sinuses/Orbits: Status post bilateral cataract extraction. Unremarkable included paranasal sinuses. Other: None. IMPRESSION: 1. No acute abnormality. 2. Stable mild  atrophy and minimal chronic small vessel white matter ischemic changes in both cerebral hemispheres. Mom Electronically Signed   By: Claudie Revering M.D.   On: 09/29/2018 12:26    Procedures Procedures (including critical care time)  Medications Ordered in ED Medications - No data to display   Initial Impression / Assessment and Plan / ED Course  I have reviewed the triage vital signs and the nursing notes.  Pertinent labs & imaging results that were available during my care of the patient were reviewed by me and considered in my medical decision making (see chart for details).     82 yo F with a chief complaint of a fall.  Will obtain a CT of the head.  She has no midline spinal tenderness is able to rotate her head 45 degrees in either direction.  No other areas of noted injury.  CT of the head is negative.  Discharge home.  1:55 PM:  I have discussed the diagnosis/risks/treatment options with the patient and family and believe the pt to be eligible for discharge home to follow-up with PCP. We also discussed returning to the ED immediately if new or worsening sx occur. We discussed the sx which are most concerning (e.g., sudden worsening pain, fever, inability to tolerate by mouth) that necessitate immediate return. Medications administered to the patient during their visit and any new prescriptions provided to the patient are listed below.  Medications given during this visit Medications - No data to display    The patient appears reasonably screen and/or stabilized for discharge and I doubt any other medical condition or other [facility] requiring further screening, evaluation, or treatment in the ED at this time prior to discharge.    Final Clinical Impressions(s) / ED Diagnoses   Final diagnoses:  Fall, initial encounter    ED Discharge Orders    None       Deno Etienne, DO 09/29/18 1355

## 2018-09-29 NOTE — ED Triage Notes (Signed)
Patient was ambulating at home around 0930, went let go of the her walker and fell. Hit back of head on floor. Daughter brings patient to be assessed due to hx of brain bleed while previously on warfarin. Daughter says patient has been off of warfarin for "a long time" and takes bayer 81 daily.

## 2018-10-14 ENCOUNTER — Other Ambulatory Visit: Payer: Self-pay | Admitting: Family Medicine

## 2018-10-14 DIAGNOSIS — G8929 Other chronic pain: Secondary | ICD-10-CM

## 2018-10-14 DIAGNOSIS — M545 Low back pain: Principal | ICD-10-CM

## 2018-10-15 ENCOUNTER — Encounter: Payer: Self-pay | Admitting: Cardiovascular Disease

## 2018-10-15 ENCOUNTER — Ambulatory Visit (INDEPENDENT_AMBULATORY_CARE_PROVIDER_SITE_OTHER): Payer: Medicare Other | Admitting: Cardiovascular Disease

## 2018-10-15 VITALS — BP 135/68 | HR 74 | Ht 64.0 in | Wt 115.0 lb

## 2018-10-15 DIAGNOSIS — E7800 Pure hypercholesterolemia, unspecified: Secondary | ICD-10-CM

## 2018-10-15 DIAGNOSIS — I495 Sick sinus syndrome: Secondary | ICD-10-CM | POA: Diagnosis not present

## 2018-10-15 DIAGNOSIS — Z950 Presence of cardiac pacemaker: Secondary | ICD-10-CM

## 2018-10-15 DIAGNOSIS — J984 Other disorders of lung: Secondary | ICD-10-CM

## 2018-10-15 DIAGNOSIS — I480 Paroxysmal atrial fibrillation: Secondary | ICD-10-CM

## 2018-10-15 DIAGNOSIS — I10 Essential (primary) hypertension: Secondary | ICD-10-CM | POA: Diagnosis not present

## 2018-10-15 NOTE — Progress Notes (Signed)
Patient ID: Anne Anthony, female   DOB: [birth year], 82 y.o.   MRN: [id number]    Cardiology Office Note    Date:  10/15/2018   ID:  Anne Anthony, DOB 11/30/1935, MRN [id number]  PCP:  Sharilyn Sites, MD  Cardiologist:   Sanda Klein, MD   No chief complaint on file.   History of Present Illness:  Anne Anthony is a 82 y.o. female with long-standing paroxysmal atrial fibrillation and symptomatic sinus bradycardia, dual chamber Medtronic pacemaker implanted in 2012, hypertension and hyperlipidemia returns in follow-up.  Roughly 3 months ago she had a fall complicated by traumatic subarachnoid hemorrhage in the setting of moderate supratherapeutic INR.  Thankfully she recovered without sequelae.  We stopped her anticoagulants.  She has had at least one serious fall since, this time without any head injury.  As before, this was not preceded by dizziness or loss of consciousness but seems to be a simple loss of balance.  She is otherwise quite sedentary, has very weak legs.  Procedural Details: The right wrist was prepped, draped, and anesthetized with 1% lidocaine. Using the modified Seldinger technique, a 5 French sheath was introduced into the right radial artery. A mixture of Nitroglycerin 200 mcg, Verapamil 5 mg and lidocaine 1% was administered through the sheath, weight-based unfractionated heparin was administered intravenously. A 60F TIG catheter was used for selective coronary angiography and left ventriculography. Catheter exchanges were performed over an exchange length guidewire. There were no immediate procedural complications. A TR band was used for radial hemostasis at the completion of the procedure.  The patient was transferred to the post catheterization recovery area for further monitoring.  Interrogation of her pacemaker shows extremely no new episodes of atrial fibrillation.  Only one episode was possibly atrial fibrillation but it lasted for only 43 seconds has been  recorded this year.  She continues to have brief episodes of paroxysmal atrial tachycardia.  Longest one was only 28 6 seconds.  She has not had any atrial arrhythmia since September 17.  Otherwise pacemaker interrogation shows normal device function.  Estimated generator longevity is 7.5 years.  She has 98% atrial pacing and <0.5% ventricular pacing.  Lead parameters remain excellent.  The underlying rhythm is severe sinus bradycardia in the 40s.  Amiodarone was discontinued roughly 2 years ago due to worries about restrictive changes on pulmonary function tests.  She has a history of remote successful ablation for atrial flutter, but then in 2015 had atrial fibrillation rapid ventricular response when she underwent surgical repair of a paraesophageal hernia.  Her pacemaker has not shown any lengthy episodes of atrial fibrillation in years.  She has little in the way of structural heart disease to predispose for atrial fibrillation recurrence.  Chest CT performed  July 2017 shows a large hiatal hernia with herniation of the splenic flexure of the colon and several small bowel loops into the chest. She does have bilateral compressive atelectasis in both lung bases. Pulmonary function tests 2017 show significant restrictive ventilatory defect with FVC that is 50% of predicted and a commensurate reduction in FEV1 and diffusion capacity . Her chest x-ray last performed in December did not show focal infiltrates but did confirm the presence of a large hiatal hernia with intrathoracic stomach gas.   Past Medical History:  Diagnosis Date  . Atrial flutter ([place])    successful radiofrequency ablation 2007  . Diverticula of colon   . Family history of colon cancer    brother  .  Hiatal hernia   . Hyperlipidemia   . Hypertension   . Pacemaker   . Paroxysmal atrial fibrillation ([facility])   . Severe protein-calorie malnutrition ([place]) 11/17/2014  . Tachycardia-bradycardia syndrome ([place])    permament pacermaker, dual  chamber Medtronic on 06/2011  . Tubular adenoma     Past Surgical History:  Procedure Laterality Date  . ABDOMINAL SURGERY    . COLONOSCOPY  08/17/2010   Dr. Gala Romney- internal hemorrhoids, o/w normal rectum, L sided diverticula, tubular adenoma  . ESOPHAGOGASTRODUODENOSCOPY  10/502011   Dr. Gala Romney- huge diaphragmatic/hiatial hernia, fundal gland polyps not manipulated o/w noral appearing gastric mucosa, patent pylorus, normal D1 & D2  . HIATAL HERNIA REPAIR  2005  . HIP ARTHROPLASTY Left 11/16/2014   Procedure: ARTHROPLASTY BIPOLAR HIP;  Surgeon: Mauri Pole, MD;  Location: [facility];  Service: Orthopedics;  Laterality: Left;  . PARTIAL HYSTERECTOMY    . permament pacemaker  06/2011   dual chamber , Medtronic Adapta serial # [id number]  last checked 02/05/2013  . RADIOFREQUENCY ABLATION  06/28/2006    Outpatient Medications Prior to Visit  Medication Sig Dispense Refill  . acetaminophen (TYLENOL) 325 MG tablet Take 1-2 tablets (325-650 mg total) by mouth every 4 (four) hours as needed for fever, headache or mild pain.    Marland Kitchen aspirin EC 81 MG tablet Take 81 mg by mouth at bedtime.    Marland Kitchen dexlansoprazole (DEXILANT) 60 MG capsule Take 1 capsule (60 mg total) by mouth daily. 20 capsule 0  . escitalopram (LEXAPRO) 10 MG tablet Take 5 mg by mouth daily.  3  . gabapentin (NEURONTIN) 300 MG capsule 1 cap at bedtime (Patient taking differently: Take 300 mg by mouth 2 (two) times daily. 1 cap at bedtime) 90 capsule 0  . metoprolol tartrate (LOPRESSOR) 25 MG tablet Take 25 mg by mouth 2 (two) times daily.    . simvastatin (ZOCOR) 20 MG tablet Take 20 mg by mouth every evening.    . trimethoprim (TRIMPEX) 100 MG tablet Take 100 mg by mouth daily.    . Vitamin D, Ergocalciferol, (DRISDOL) 1.25 MG (50000 UT) CAPS capsule TAKE 1 CAPSULE BY MOUTH EACH WEEKLY- AFTER FINISHING CHANGE TO OTC 2000 IU DAILY  0  . DEXILANT 60 MG capsule TAKE 1 CAPSULE BY MOUTH EVERY DAY (Patient not taking: Reported on 09/29/2018) 90  capsule 3   No facility-administered medications prior to visit.      Allergies:   Morphine and related   Social History   Socioeconomic History  . Marital status: Widowed    Spouse name: Not on file  . Number of children: 4  . Years of education: Not on file  . Highest education level: Not on file  Occupational History  . Not on file  Social Needs  . Financial resource strain: Not on file  . Food insecurity:    Worry: Not on file    Inability: Not on file  . Transportation needs:    Medical: Not on file    Non-medical: Not on file  Tobacco Use  . Smoking status: Never Smoker  . Smokeless tobacco: Never Used  Substance and Sexual Activity  . Alcohol use: No  . Drug use: No  . Sexual activity: Not on file  Lifestyle  . Physical activity:    Days per week: Not on file    Minutes per session: Not on file  . Stress: Not on file  Relationships  . Social connections:    Talks on phone:  Not on file    Gets together: Not on file    Attends religious service: Not on file    Active member of club or organization: Not on file    Attends meetings of clubs or organizations: Not on file    Relationship status: Not on file  Other Topics Concern  . Not on file  Social History Narrative  . Not on file     Family History:  The patient's family history includes Diabetes in her mother.   ROS:   Please see the history of present illness.    ROS All other systems are reviewed and are negative   PHYSICAL EXAM:   VS:  BP 135/68   Pulse 74   Ht 5\' 4"  (1.626 m)   Wt 115 lb (52.2 kg)   BMI 19.74 kg/m      General: Alert, oriented x3, no distress, very slender, underweight.  Healthy left subclavian pacemaker site Head: no evidence of trauma, PERRL, EOMI, no exophtalmos or lid lag, no myxedema, no xanthelasma; normal ears, nose and oropharynx.  Ecchymoses have resolved Neck: normal jugular venous pulsations and no hepatojugular reflux; brisk carotid pulses without delay and  no carotid bruits Chest: clear to auscultation, no signs of consolidation by percussion or palpation, normal fremitus, symmetrical and full respiratory excursions Cardiovascular: normal position and quality of the apical impulse, regular rhythm, normal first and second heart sounds, no murmurs, rubs or gallops Abdomen: no tenderness or distention, no masses by palpation, no abnormal pulsatility or arterial bruits, normal bowel sounds, no hepatosplenomegaly Extremities: no clubbing, cyanosis or edema; 2+ radial, ulnar and brachial pulses bilaterally; 2+ right femoral, posterior tibial and dorsalis pedis pulses; 2+ left femoral, posterior tibial and dorsalis pedis pulses; no subclavian or femoral bruits Neurological: grossly nonfocal Psych: Normal mood and affect     Wt Readings from Last 3 Encounters:  10/15/18 115 lb (52.2 kg)  09/29/18 117 lb (53.1 kg)  02/07/18 119 lb 6.4 oz (54.2 kg)      Studies/Labs Reviewed:   EKG:  EKG is ordered today.  It shows atrial paced, ventricular sensed rhythm with minor conduction abnormality incomplete right bundle branch block, no ischemic ST-T changes  BMET    Component Value Date/Time   NA 139 01/31/2018 0359   K 4.3 01/31/2018 0359   CL 105 01/31/2018 0359   CO2 25 01/31/2018 0359   GLUCOSE 136 (H) 01/31/2018 0359   BUN 17 01/31/2018 0359   CREATININE 1.00 01/31/2018 0359   CALCIUM 8.9 01/31/2018 0359   GFRNONAA 52 (L) 01/31/2018 0359   GFRAA >60 01/31/2018 0359     ASSESSMENT:    1. Paroxysmal atrial fibrillation (HCC)   2. Essential hypertension   3. SSS (sick sinus syndrome) ([place])   4. Pacemaker   5. Hypercholesterolemia   6. Restrictive lung disease      PLAN:  In order of problems listed above:  1. AFib: Last meaningful episode of atrial fibrillation occurred in 2015 and was perioperative.  I think it is best that she remains off anticoagulants since she continues to have falls.  Continue to monitor the burden of atrial  fibrillation using her pacemaker and consider restarting anticoagulation if atrial fibrillation prevalence increases substantially or if she has any focal neurological events., CHADSVasc 4 split (age 53, gender, HTN). 2. HTN: Adequate control 3. SSS: Basically 100% atrial paced with appropriate heart rate response settings based on the histograms. 4. PM: She does not like to do remote  downloads.  Recheck in office in 6 months  5. HLP: On statin 6. Restrictive ventilatory defect: This was probably not related to amiodarone, but more likely due to the very large diaphragmatic hernia.  I would not entirely exclude the use of amiodarone in the future.   Medication Adjustments/Labs and Tests Ordered: Current medicines are reviewed at length with the patient today.  Concerns regarding medicines are outlined above.  Medication changes, Labs and Tests ordered today are listed in the Patient Instructions below. Patient Instructions  Medication Instructions:  Dr Sallyanne Kuster recommends that you continue on your current medications as directed. Please refer to the Current Medication list given to you today.  If you need a refill on your cardiac medications before your next appointment, please call your pharmacy.   Follow-Up: At [facility], you and your health needs are our priority.  As part of our continuing mission to provide you with exceptional heart care, we have created designated Provider Care Teams.  These Care Teams include your primary Cardiologist (physician) and Advanced Practice Providers (APPs -  Physician Assistants and Nurse Practitioners) who all work together to provide you with the care you need, when you need it. You will need a follow up appointment in 6 months.  Please call our office 2 months in advance to schedule this appointment.  You may see Sanda Klein, MD or one of the following Advanced Practice Providers on your designated Care Team: [place], [place] . Fabian Sharp, PA-C       Signed, Sanda Klein, MD  10/15/2018 1:25 PM    [facility] [place], [place], [place]  [address] Phone: [phone]; Fax: [phone]

## 2018-10-15 NOTE — Patient Instructions (Signed)
Medication Instructions:  Dr Croitoru recommends that you continue on your current medications as directed. Please refer to the Current Medication list given to you today.  If you need a refill on your cardiac medications before your next appointment, please call your pharmacy.   Follow-Up: At [facility], you and your health needs are our priority.  As part of our continuing mission to provide you with exceptional heart care, we have created designated Provider Care Teams.  These Care Teams include your primary Cardiologist (physician) and Advanced Practice Providers (APPs -  Physician Assistants and Nurse Practitioners) who all work together to provide you with the care you need, when you need it. You will need a follow up appointment in 6 months.  Please call our office 2 months in advance to schedule this appointment.  You may see Mihai Croitoru, MD or one of the following Advanced Practice Providers on your designated Care Team: Hao Meng, PA-C . Angela Duke, PA-C 

## 2018-10-21 ENCOUNTER — Ambulatory Visit
Admission: RE | Admit: 2018-10-21 | Discharge: 2018-10-21 | Disposition: A | Discharge status: Home / Self Care | Payer: Medicare Other | Source: Ambulatory Visit | Attending: Family Medicine | Admitting: Family Medicine

## 2018-10-21 DIAGNOSIS — M545 Low back pain, unspecified: Secondary | ICD-10-CM

## 2018-10-21 DIAGNOSIS — G8929 Other chronic pain: Secondary | ICD-10-CM

## 2018-10-21 MED ORDER — IOPAMIDOL (ISOVUE-M 200) INJECTION 41%
1.0000 mL | Freq: Once | INTRAMUSCULAR | Status: AC
  Administered 2018-10-21: 1 mL via EPIDURAL

## 2018-10-21 MED ORDER — METHYLPREDNISOLONE ACETATE 40 MG/ML INJ SUSP (RADIOLOG
120.0000 mg | Freq: Once | INTRAMUSCULAR | Status: AC
  Administered 2018-10-21: 120 mg via EPIDURAL

## 2018-12-09 LAB — CUP PACEART INCLINIC DEVICE CHECK
Date Time Interrogation Session: 20200127142003
Implantable Lead Implant Date: 20120816
Implantable Lead Location: 753859
Implantable Lead Model: 5076
Implantable Lead Model: 5076
MDC IDC LEAD IMPLANT DT: 20120816
MDC IDC LEAD LOCATION: 753860
MDC IDC PG IMPLANT DT: 20120816

## 2019-04-14 DEATH — deceased

## 2019-10-06 ENCOUNTER — Other Ambulatory Visit: Payer: Self-pay

## 2019-10-06 ENCOUNTER — Encounter: Payer: Medicare Other | Admitting: Cardiovascular Disease

## 2019-10-06 NOTE — Telephone Encounter (Signed)
This patient has not bee seen since 2015. No refills provided. If refills desired from [place], she will need ov.

## 2020-02-09 ENCOUNTER — Ambulatory Visit (INDEPENDENT_AMBULATORY_CARE_PROVIDER_SITE_OTHER): Payer: Medicare PPO | Admitting: Cardiovascular Disease

## 2020-02-09 ENCOUNTER — Other Ambulatory Visit: Payer: Self-pay

## 2020-02-09 ENCOUNTER — Encounter: Payer: Self-pay | Admitting: Cardiovascular Disease

## 2020-02-09 VITALS — BP 110/70 | HR 82 | Ht 64.0 in | Wt 110.8 lb

## 2020-02-09 DIAGNOSIS — I495 Sick sinus syndrome: Secondary | ICD-10-CM | POA: Diagnosis not present

## 2020-02-09 DIAGNOSIS — R942 Abnormal results of pulmonary function studies: Secondary | ICD-10-CM | POA: Diagnosis not present

## 2020-02-09 DIAGNOSIS — I10 Essential (primary) hypertension: Secondary | ICD-10-CM

## 2020-02-09 DIAGNOSIS — Z950 Presence of cardiac pacemaker: Secondary | ICD-10-CM | POA: Diagnosis not present

## 2020-02-09 DIAGNOSIS — I480 Paroxysmal atrial fibrillation: Secondary | ICD-10-CM | POA: Diagnosis not present

## 2020-02-09 DIAGNOSIS — E7800 Pure hypercholesterolemia, unspecified: Secondary | ICD-10-CM

## 2020-02-09 NOTE — Progress Notes (Signed)
Patient ID: Anne Anthony, female   DOB: 05-05-1936, 84 y.o.   MRN: [id number]    Visit [facility] cardiology Office Note    Date:  02/09/2020   ID:  Anne Anthony, DOB 1936-02-08, MRN [id number]  PCP:  Sharilyn Sites, MD  Cardiologist:   Sanda Klein, MD   Chief Complaint  Patient presents with  . Irregular Heart Beat  . Pacemaker Check    History of Present Illness:  Anne Anthony is a 84 y.o. female with long-standing paroxysmal atrial fibrillation and symptomatic sinus bradycardia, dual chamber Medtronic pacemaker implanted in 2012, hypertension and hyperlipidemia returns in follow-up.  Last year she had an episode of traumatic subarachnoid hemorrhage and she had at least one other serious fall. She has been off anticoagulation. She is thinking of moving in with her daughter and son-in-law.  Thankfully, she has not had any neurological events and has not had any atrial fibrillation over the last 12 months. She had 1 episode of paroxysmal atrial tachycardia consisting of only 12 beats that occurred in October 2020, none since.  Pacemaker check otherwise shows normal device function with estimated generator longevity of 5.5 years. Both atrial and ventricular leads are 5076 leads with excellent parameters.  She has 96% atrial pacing and less than 0.5% ventricular pacing. The underlying rhythm is severe sinus bradycardia around 40 bpm  Amiodarone was discontinued several years ago due to worries about restrictive changes on pulmonary function tests.  She has a history of remote successful ablation for atrial flutter, but then in 2015 had atrial fibrillation rapid ventricular response when she underwent surgical repair of a paraesophageal hernia.  Her pacemaker has not shown any lengthy episodes of atrial fibrillation in years.  She has little in the way of structural heart disease to predispose for atrial fibrillation recurrence.  Chest CT performed  July 2017 shows a large hiatal hernia  with herniation of the splenic flexure of the colon and several small bowel loops into the chest. She does have bilateral compressive atelectasis in both lung bases. Pulmonary function tests 2017 show significant restrictive ventilatory defect with FVC that is 50% of predicted and a commensurate reduction in FEV1 and diffusion capacity . Her chest x-ray last performed in December did not show focal infiltrates but did confirm the presence of a large hiatal hernia with intrathoracic stomach gas.   Past Medical History:  Diagnosis Date  . Atrial flutter ([place])    successful radiofrequency ablation 2007  . Diverticula of colon   . Family history of colon cancer    brother  . Hiatal hernia   . Hyperlipidemia   . Hypertension   . Pacemaker   . Paroxysmal atrial fibrillation (HCC)   . Severe protein-calorie malnutrition ([place]) 11/17/2014  . Tachycardia-bradycardia syndrome ([place])    permament pacermaker, dual chamber Medtronic on 06/2011  . Tubular adenoma     Past Surgical History:  Procedure Laterality Date  . ABDOMINAL SURGERY    . COLONOSCOPY  08/17/2010   Dr. Gala Romney- internal hemorrhoids, o/w normal rectum, L sided diverticula, tubular adenoma  . ESOPHAGOGASTRODUODENOSCOPY  10/502011   Dr. Gala Romney- huge diaphragmatic/hiatial hernia, fundal gland polyps not manipulated o/w noral appearing gastric mucosa, patent pylorus, normal D1 & D2  . HIATAL HERNIA REPAIR  2005  . HIP ARTHROPLASTY Left 11/16/2014   Procedure: ARTHROPLASTY BIPOLAR HIP;  Surgeon: Mauri Pole, MD;  Location: [facility];  Service: Orthopedics;  Laterality: Left;  . PARTIAL HYSTERECTOMY    . permament  pacemaker  06/2011   dual chamber , Medtronic Adapta serial # [id number]  last checked 02/05/2013  . RADIOFREQUENCY ABLATION  06/28/2006    Outpatient Medications Prior to Visit  Medication Sig Dispense Refill  . acetaminophen (TYLENOL) 325 MG tablet Take 1-2 tablets (325-650 mg total) by mouth every 4 (four) hours as needed for  fever, headache or mild pain.    Marland Kitchen aspirin EC 81 MG tablet Take 81 mg by mouth at bedtime.    Marland Kitchen dexlansoprazole (DEXILANT) 60 MG capsule Take 1 capsule (60 mg total) by mouth daily. 20 capsule 0  . escitalopram (LEXAPRO) 10 MG tablet Take 5 mg by mouth daily.  3  . gabapentin (NEURONTIN) 300 MG capsule 1 cap at bedtime (Patient taking differently: Take 300 mg by mouth 2 (two) times daily. 1 cap at bedtime) 90 capsule 0  . metoprolol tartrate (LOPRESSOR) 25 MG tablet Take 25 mg by mouth 2 (two) times daily.    . simvastatin (ZOCOR) 20 MG tablet Take 20 mg by mouth every evening.    . trimethoprim (TRIMPEX) 100 MG tablet Take 100 mg by mouth daily.    . Vitamin D, Ergocalciferol, (DRISDOL) 1.25 MG (50000 UT) CAPS capsule TAKE 1 CAPSULE BY MOUTH EACH WEEKLY- AFTER FINISHING CHANGE TO OTC 2000 IU DAILY  0   No facility-administered medications prior to visit.     Allergies:   Morphine and related   Social History   Socioeconomic History  . Marital status: Widowed    Spouse name: Not on file  . Number of children: 4  . Years of education: Not on file  . Highest education level: Not on file  Occupational History  . Not on file  Tobacco Use  . Smoking status: Never Smoker  . Smokeless tobacco: Never Used  Substance and Sexual Activity  . Alcohol use: No  . Drug use: No  . Sexual activity: Not on file  Other Topics Concern  . Not on file  Social History Narrative  . Not on file   Social Determinants of Health   Financial Resource Strain:   . Difficulty of Paying Living Expenses:   Food Insecurity:   . Worried About Charity fundraiser in the Last Year:   . Arboriculturist in the Last Year:   Transportation Needs:   . Film/video editor (Medical):   Marland Kitchen Lack of Transportation (Non-Medical):   Physical Activity:   . Days of Exercise per Week:   . Minutes of Exercise per Session:   Stress:   . Feeling of Stress :   Social Connections:   . Frequency of Communication with  Friends and Family:   . Frequency of Social Gatherings with Friends and Family:   . Attends Religious Services:   . Active Member of Clubs or Organizations:   . Attends Archivist Meetings:   Marland Kitchen Marital Status:      Family History:  The patient's family history includes Diabetes in her mother.   ROS:   Please see the history of present illness.    ROS All other systems are reviewed and are negative   PHYSICAL EXAM:   VS:  BP 110/70   Pulse 82   Ht 5\' 4"  (1.626 m)   Wt 110 lb 12.8 oz (50.3 kg)   SpO2 94%   BMI 19.02 kg/m      General: Alert, oriented x3, no distress, very lean Head: no evidence of trauma, PERRL, EOMI, no exophtalmos  or lid lag, no myxedema, no xanthelasma; normal ears, nose and oropharynx Neck: normal jugular venous pulsations and no hepatojugular reflux; brisk carotid pulses without delay and no carotid bruits Chest: clear to auscultation, no signs of consolidation by percussion or palpation, normal fremitus, symmetrical and full respiratory excursions Cardiovascular: normal position and quality of the apical impulse, regular rhythm, normal first and second heart sounds, no murmurs, rubs or gallops Abdomen: no tenderness or distention, no masses by palpation, no abnormal pulsatility or arterial bruits, normal bowel sounds, no hepatosplenomegaly Extremities: no clubbing, cyanosis or edema; 2+ radial, ulnar and brachial pulses bilaterally; 2+ right femoral, posterior tibial and dorsalis pedis pulses; 2+ left femoral, posterior tibial and dorsalis pedis pulses; no subclavian or femoral bruits Neurological: grossly nonfocal Psych: Normal mood and affect  Wt Readings from Last 3 Encounters:  02/09/20 110 lb 12.8 oz (50.3 kg)  10/15/18 115 lb (52.2 kg)  09/29/18 117 lb (53.1 kg)      Studies/Labs Reviewed:   EKG:  EKG is ordered today. It shows atrial paced, ventricular sensed rhythm and incomplete right bundle branch block (QRS 100 ms), no  repolarization normalities, QTC 432 ms BMET    Component Value Date/Time   NA 139 01/31/2018 0359   K 4.3 01/31/2018 0359   CL 105 01/31/2018 0359   CO2 25 01/31/2018 0359   GLUCOSE 136 (H) 01/31/2018 0359   BUN 17 01/31/2018 0359   CREATININE 1.00 01/31/2018 0359   CALCIUM 8.9 01/31/2018 0359   GFRNONAA 52 (L) 01/31/2018 0359   GFRAA >60 01/31/2018 0359   Lipid Panel  No results found for: CHOL, TRIG, HDL, CHOLHDL, VLDL, LDLCALC, LDLDIRECT, LABVLDL  07/30/2019  total cholesterol 136, HDL 57, LDL 62, triglycerides 92. Hemoglobin A1c 6.4% Hemoglobin 13.4 Creatinine 1.09 Potassium 4.6 TSH 1.190  ASSESSMENT:    1. Paroxysmal atrial fibrillation (HCC)   2. Essential hypertension   3. SSS (sick sinus syndrome) ([place])   4. Pacemaker   5. Hypercholesterolemia   6. Restrictive ventilatory defect      PLAN:  In order of problems listed above:  1. AFib: Last meaningful episode of atrial fibrillation occurred in 2015 and was perioperative. The risk of serious injury or bleeding with falls appears to exceed the risk of embolic stroke. She has had at least one episode of serious complications (traumatic subarachnoid hemorrhage). CHADSVasc 4  (age 65, gender, HTN). 2. HTN: Well-controlled. 3. SSS: Virtually 100% atrial pacing with good heart rate histogram distribution based on the current sensor settings 4. PM: Prefers in office checks. We will see her back in 6 months. 5. HLP: All lipid parameters in target range on current statin dose 6. Restrictive ventilatory defect: This was probably not related to amiodarone, but more likely due to the very large diaphragmatic hernia.  I would not entirely exclude the use of amiodarone in the future.   Medication Adjustments/Labs and Tests Ordered: Current medicines are reviewed at length with the patient today.  Concerns regarding medicines are outlined above.  Medication changes, Labs and Tests ordered today are listed in the Patient  Instructions below. Patient Instructions  Medication Instructions:  No changes *If you need a refill on your cardiac medications before your next appointment, please call your pharmacy*   Lab Work: None ordered If you have labs (blood work) drawn today and your tests are completely normal, you will receive your results only by: Marland Kitchen MyChart Message (if you have MyChart) OR . A paper copy in the mail If  you have any lab test that is abnormal or we need to change your treatment, we will call you to review the results.   Testing/Procedures: None ordered   Follow-Up: At [facility], you and your health needs are our priority.  As part of our continuing mission to provide you with exceptional heart care, we have created designated Provider Care Teams.  These Care Teams include your primary Cardiologist (physician) and Advanced Practice Providers (APPs -  Physician Assistants and Nurse Practitioners) who all work together to provide you with the care you need, when you need it.  We recommend signing up for the patient portal called "MyChart".  Sign up information is provided on this After Visit Summary.  MyChart is used to connect with patients for Virtual Visits (Telemedicine).  Patients are able to view lab/test results, encounter notes, upcoming appointments, etc.  Non-urgent messages can be sent to your provider as well.   To learn more about what you can do with MyChart, go to [email or web address].    Your next appointment:   6 month(s)  The format for your next appointment:   In Person  Provider:   Sanda Klein, MD        Signed, Sanda Klein, MD  02/09/2020 2:40 PM    [facility] [address], [place], [place]  [address] Phone: [phone]; Fax: [phone]

## 2020-02-09 NOTE — Patient Instructions (Signed)

## 2020-02-23 DIAGNOSIS — H401132 Primary open-angle glaucoma, bilateral, moderate stage: Secondary | ICD-10-CM | POA: Diagnosis not present

## 2020-03-02 DIAGNOSIS — B351 Tinea unguium: Secondary | ICD-10-CM | POA: Diagnosis not present

## 2020-03-02 DIAGNOSIS — M79676 Pain in unspecified toe(s): Secondary | ICD-10-CM | POA: Diagnosis not present

## 2020-03-02 DIAGNOSIS — G609 Hereditary and idiopathic neuropathy, unspecified: Secondary | ICD-10-CM | POA: Diagnosis not present

## 2020-03-02 DIAGNOSIS — L84 Corns and callosities: Secondary | ICD-10-CM | POA: Diagnosis not present

## 2020-03-09 DIAGNOSIS — Z950 Presence of cardiac pacemaker: Secondary | ICD-10-CM | POA: Diagnosis not present

## 2020-03-09 DIAGNOSIS — I4891 Unspecified atrial fibrillation: Secondary | ICD-10-CM | POA: Diagnosis not present

## 2020-03-09 DIAGNOSIS — Z681 Body mass index (BMI) 19 or less, adult: Secondary | ICD-10-CM | POA: Diagnosis not present

## 2020-03-09 DIAGNOSIS — I10 Essential (primary) hypertension: Secondary | ICD-10-CM | POA: Diagnosis not present

## 2020-03-09 DIAGNOSIS — R7309 Other abnormal glucose: Secondary | ICD-10-CM | POA: Diagnosis not present

## 2020-03-09 DIAGNOSIS — I480 Paroxysmal atrial fibrillation: Secondary | ICD-10-CM | POA: Diagnosis not present

## 2020-03-09 DIAGNOSIS — J302 Other seasonal allergic rhinitis: Secondary | ICD-10-CM | POA: Diagnosis not present

## 2020-03-09 DIAGNOSIS — Z0000 Encounter for general adult medical examination without abnormal findings: Secondary | ICD-10-CM | POA: Diagnosis not present

## 2020-03-09 DIAGNOSIS — Z1389 Encounter for screening for other disorder: Secondary | ICD-10-CM | POA: Diagnosis not present

## 2020-03-09 DIAGNOSIS — E782 Mixed hyperlipidemia: Secondary | ICD-10-CM | POA: Diagnosis not present

## 2020-03-09 DIAGNOSIS — E7849 Other hyperlipidemia: Secondary | ICD-10-CM | POA: Diagnosis not present

## 2020-03-09 DIAGNOSIS — E559 Vitamin D deficiency, unspecified: Secondary | ICD-10-CM | POA: Diagnosis not present

## 2020-04-02 ENCOUNTER — Other Ambulatory Visit: Payer: Self-pay | Admitting: Urology

## 2020-06-22 DIAGNOSIS — B351 Tinea unguium: Secondary | ICD-10-CM | POA: Diagnosis not present

## 2020-06-22 DIAGNOSIS — M79676 Pain in unspecified toe(s): Secondary | ICD-10-CM | POA: Diagnosis not present

## 2020-06-22 DIAGNOSIS — G609 Hereditary and idiopathic neuropathy, unspecified: Secondary | ICD-10-CM | POA: Diagnosis not present

## 2020-06-22 DIAGNOSIS — L84 Corns and callosities: Secondary | ICD-10-CM | POA: Diagnosis not present

## 2020-08-30 DIAGNOSIS — H401132 Primary open-angle glaucoma, bilateral, moderate stage: Secondary | ICD-10-CM | POA: Diagnosis not present

## 2020-09-14 DIAGNOSIS — H401132 Primary open-angle glaucoma, bilateral, moderate stage: Secondary | ICD-10-CM | POA: Diagnosis not present

## 2020-09-16 DIAGNOSIS — Z681 Body mass index (BMI) 19 or less, adult: Secondary | ICD-10-CM | POA: Diagnosis not present

## 2020-09-16 DIAGNOSIS — R490 Dysphonia: Secondary | ICD-10-CM | POA: Diagnosis not present

## 2020-09-27 ENCOUNTER — Other Ambulatory Visit: Payer: Self-pay

## 2020-09-27 ENCOUNTER — Ambulatory Visit (INDEPENDENT_AMBULATORY_CARE_PROVIDER_SITE_OTHER): Payer: Medicare PPO | Admitting: Cardiovascular Disease

## 2020-09-27 ENCOUNTER — Encounter: Payer: Self-pay | Admitting: Cardiovascular Disease

## 2020-09-27 VITALS — BP 133/75 | HR 77 | Ht 64.0 in | Wt 104.8 lb

## 2020-09-27 DIAGNOSIS — E7800 Pure hypercholesterolemia, unspecified: Secondary | ICD-10-CM | POA: Diagnosis not present

## 2020-09-27 DIAGNOSIS — I495 Sick sinus syndrome: Secondary | ICD-10-CM | POA: Diagnosis not present

## 2020-09-27 DIAGNOSIS — Z950 Presence of cardiac pacemaker: Secondary | ICD-10-CM

## 2020-09-27 DIAGNOSIS — R942 Abnormal results of pulmonary function studies: Secondary | ICD-10-CM | POA: Diagnosis not present

## 2020-09-27 DIAGNOSIS — I10 Essential (primary) hypertension: Secondary | ICD-10-CM | POA: Diagnosis not present

## 2020-09-27 DIAGNOSIS — I480 Paroxysmal atrial fibrillation: Secondary | ICD-10-CM | POA: Diagnosis not present

## 2020-09-27 DIAGNOSIS — E43 Unspecified severe protein-calorie malnutrition: Secondary | ICD-10-CM

## 2020-09-27 DIAGNOSIS — K449 Diaphragmatic hernia without obstruction or gangrene: Secondary | ICD-10-CM

## 2020-09-27 NOTE — Patient Instructions (Signed)

## 2020-09-27 NOTE — Progress Notes (Signed)
Patient ID: Anne Anthony, female   DOB: 03-24-1936, 84 y.o.   MRN: [id number]    [facility] Office Note    Date:  09/27/2020   ID:  Anne Anthony, DOB 1936-03-04, MRN [id number]  PCP:  Sharilyn Sites, MD  Cardiologist:   Sanda Klein, MD   Chief Complaint  Patient presents with  . Pacemaker Check  . Irregular Heart Beat    History of Present Illness:  Anne Anthony is a 84 y.o. female with long-standing paroxysmal atrial fibrillation and symptomatic sinus bradycardia, dual chamber Medtronic pacemaker implanted in 2012, hypertension and hyperlipidemia returns in follow-up.  She has no cardiovascular complaints, but seems to be showing some signs of decline.  She has lost another 6 pounds in the last 6 months and now is frankly underweight with a BMI less than 18.  She can only meet tiny amounts at a time since "she has a small stomach since surgery".  She is a very picky eater.  She does not like boost, Ensure or [employer].  She does like eggs.  The patient specifically denies any chest pain at rest exertion, dyspnea at rest or with exertion, orthopnea, paroxysmal nocturnal dyspnea, syncope, palpitations, focal neurological deficits, intermittent claudication, lower extremity edema, unexplained weight gain, cough, hemoptysis or wheezing.  She has not had any falls.  In 2019 she had a serious fall complicated by subarachnoid hemorrhage and she has been off anticoagulants since.  Her pacemaker has not shown any evidence of atrial fibrillation in a very long time.  She has occasional episodes of paroxysmal atrial tachycardia, the longest detected over the last 6 months was only 6 seconds in duration.  Pacemaker function is otherwise normal.  Her Medtronic Adapta dual-chamber device implanted in 2012 has an anticipated 4.5 years of generator longevity.  She has 97% atrial pacing and only 0.6% ventricular pacing.  Her underlying rhythm was severe sinus bradycardia  at about 40 bpm, but she is not pacemaker dependent.  Amiodarone was discontinued several years ago due to worries about restrictive changes on pulmonary function tests.  She has a history of remote successful ablation for atrial flutter, but then in 2015 had atrial fibrillation rapid ventricular response when she underwent surgical repair of a paraesophageal hernia.  Her pacemaker has not shown any lengthy episodes of atrial fibrillation in years.  She has little in the way of structural heart disease to predispose for atrial fibrillation recurrence.  Chest CT performed  July 2017 shows a large hiatal hernia with herniation of the splenic flexure of the colon and several small bowel loops into the chest. She does have bilateral compressive atelectasis in both lung bases. Pulmonary function tests 2017 show significant restrictive ventilatory defect with FVC that is 50% of predicted and a commensurate reduction in FEV1 and diffusion capacity . Her chest x-ray last performed in December did not show focal infiltrates but did confirm the presence of a large hiatal hernia with intrathoracic stomach gas.   Past Medical History:  Diagnosis Date  . Atrial flutter ([place])    successful radiofrequency ablation 2007  . Diverticula of colon   . Family history of colon cancer    brother  . Hiatal hernia   . Hyperlipidemia   . Hypertension   . Pacemaker   . Paroxysmal atrial fibrillation (HCC)   . Severe protein-calorie malnutrition ([place]) 11/17/2014  . Tachycardia-bradycardia syndrome ([place])    permament pacermaker, dual chamber Medtronic on 06/2011  . Tubular  adenoma     Past Surgical History:  Procedure Laterality Date  . ABDOMINAL SURGERY    . COLONOSCOPY  08/17/2010   Dr. Gala Romney- internal hemorrhoids, o/w normal rectum, L sided diverticula, tubular adenoma  . ESOPHAGOGASTRODUODENOSCOPY  10/502011   Dr. Gala Romney- huge diaphragmatic/hiatial hernia, fundal gland polyps not manipulated o/w noral appearing  gastric mucosa, patent pylorus, normal D1 & D2  . HIATAL HERNIA REPAIR  2005  . HIP ARTHROPLASTY Left 11/16/2014   Procedure: ARTHROPLASTY BIPOLAR HIP;  Surgeon: Mauri Pole, MD;  Location: [facility];  Service: Orthopedics;  Laterality: Left;  . PARTIAL HYSTERECTOMY    . permament pacemaker  06/2011   dual chamber , Medtronic Adapta serial # [id number]  last checked 02/05/2013  . RADIOFREQUENCY ABLATION  06/28/2006    Outpatient Medications Prior to Visit  Medication Sig Dispense Refill  . acetaminophen (TYLENOL) 325 MG tablet Take 1-2 tablets (325-650 mg total) by mouth every 4 (four) hours as needed for fever, headache or mild pain.    Marland Kitchen aspirin EC 81 MG tablet Take 81 mg by mouth at bedtime.    Marland Kitchen dexlansoprazole (DEXILANT) 60 MG capsule Take 1 capsule (60 mg total) by mouth daily. 20 capsule 0  . escitalopram (LEXAPRO) 10 MG tablet Take 5 mg by mouth daily.  3  . gabapentin (NEURONTIN) 300 MG capsule 1 cap at bedtime (Patient taking differently: Take 300 mg by mouth 2 (two) times daily. 1 cap at bedtime) 90 capsule 0  . metoprolol tartrate (LOPRESSOR) 25 MG tablet Take 25 mg by mouth 2 (two) times daily.    . simvastatin (ZOCOR) 20 MG tablet Take 20 mg by mouth every evening.    . trimethoprim (TRIMPEX) 100 MG tablet TAKE 1 TABLET BY MOUTH AT BEDTIME 90 tablet 3  . Vitamin D, Ergocalciferol, (DRISDOL) 1.25 MG (50000 UT) CAPS capsule TAKE 1 CAPSULE BY MOUTH EACH WEEKLY- AFTER FINISHING CHANGE TO OTC 2000 IU DAILY  0   No facility-administered medications prior to visit.     Allergies:   Morphine and related   Social History   Socioeconomic History  . Marital status: Widowed    Spouse name: Not on file  . Number of children: 4  . Years of education: Not on file  . Highest education level: Not on file  Occupational History  . Not on file  Tobacco Use  . Smoking status: Never Smoker  . Smokeless tobacco: Never Used  Substance and Sexual Activity  . Alcohol use: No  . Drug use:  No  . Sexual activity: Not on file  Other Topics Concern  . Not on file  Social History Narrative  . Not on file   Social Determinants of Health   Financial Resource Strain:   . Difficulty of Paying Living Expenses: Not on file  Food Insecurity:   . Worried About Charity fundraiser in the Last Year: Not on file  . Ran Out of Food in the Last Year: Not on file  Transportation Needs:   . Lack of Transportation (Medical): Not on file  . Lack of Transportation (Non-Medical): Not on file  Physical Activity:   . Days of Exercise per Week: Not on file  . Minutes of Exercise per Session: Not on file  Stress:   . Feeling of Stress : Not on file  Social Connections:   . Frequency of Communication with Friends and Family: Not on file  . Frequency of Social Gatherings with Friends and Family: Not  on file  . Attends Religious Services: Not on file  . Active Member of Clubs or Organizations: Not on file  . Attends Archivist Meetings: Not on file  . Marital Status: Not on file     Family History:  The patient's family history includes Diabetes in her mother.   ROS:   Please see the history of present illness.    ROS All other systems are reviewed and are negative   PHYSICAL EXAM:   VS:  BP 133/75   Pulse 77   Ht 5\' 4"  (1.626 m)   Wt 104 lb 12.8 oz (47.5 kg)   SpO2 99%   BMI 17.99 kg/m      General: Alert, oriented x3, no distress, she is very lean, underweight, appears elderly and frail.  The left subclavian pacemaker site is healthy. Head: no evidence of trauma, PERRL, EOMI, no exophtalmos or lid lag, no myxedema, no xanthelasma; normal ears, nose and oropharynx Neck: normal jugular venous pulsations and no hepatojugular reflux; brisk carotid pulses without delay and no carotid bruits Chest: clear to auscultation, no signs of consolidation by percussion or palpation, normal fremitus, symmetrical and full respiratory excursions Cardiovascular: normal position and  quality of the apical impulse, regular rhythm, normal first and second heart sounds, no murmurs, rubs or gallops Abdomen: no tenderness or distention, no masses by palpation, no abnormal pulsatility or arterial bruits, normal bowel sounds, no hepatosplenomegaly Extremities: no clubbing, cyanosis or edema; 2+ radial, ulnar and brachial pulses bilaterally; 2+ right femoral, posterior tibial and dorsalis pedis pulses; 2+ left femoral, posterior tibial and dorsalis pedis pulses; no subclavian or femoral bruits Neurological: grossly nonfocal Psych: Normal mood and affect   Wt Readings from Last 3 Encounters:  09/27/20 104 lb 12.8 oz (47.5 kg)  02/09/20 110 lb 12.8 oz (50.3 kg)  10/15/18 115 lb (52.2 kg)      Studies/Labs Reviewed:   EKG:  EKG is ordered today.  It shows atrial paced, ventricular sensed rhythm and is otherwise unremarkable.  No ischemic repolarization changes, QTC 420 ms. BMET    Component Value Date/Time   NA 139 01/31/2018 0359   K 4.3 01/31/2018 0359   CL 105 01/31/2018 0359   CO2 25 01/31/2018 0359   GLUCOSE 136 (H) 01/31/2018 0359   BUN 17 01/31/2018 0359   CREATININE 1.00 01/31/2018 0359   CALCIUM 8.9 01/31/2018 0359   GFRNONAA 52 (L) 01/31/2018 0359   GFRAA >60 01/31/2018 0359   Lipid Panel  No results found for: CHOL, TRIG, HDL, CHOLHDL, VLDL, LDLCALC, LDLDIRECT, LABVLDL  07/30/2019  total cholesterol 136, HDL 57, LDL 62, triglycerides 92. Hemoglobin A1c 6.4% Hemoglobin 13.4 Creatinine 1.09 Potassium 4.6 TSH 1.190  03/09/2020 Total cholesterol 142, HDL 55, LDL 69, triglycerides 95 Hemoglobin A1c 6.6% Hemoglobin 13.4, creatinine 1.1, potassium 4.7, normal liver function tests, TSH 1.18  ASSESSMENT:    1. Paroxysmal atrial fibrillation (HCC)   2. Essential hypertension   3. SSS (sick sinus syndrome) ([place])   4. Pacemaker   5. Hypercholesterolemia   6. Restrictive ventilatory defect   7. Diaphragmatic hernia without obstruction and without gangrene    8. Severe protein-calorie malnutrition ([place])      PLAN:  In order of problems listed above:  1. AFib: She has not had any atrial fibrillation since the perioperative event that occurred in 2015.  She has had subarachnoid hemorrhage after a fall.  The risk of bleeding continues to exceed the risk of embolic stroke.  She  is not on anticoagulation.. CHADSVasc 4  (age 88, gender, HTN). 2. HTN: Well-controlled. 3. SSS: Has virtually 100% atrial pacing.  The current sensor settings seem to be providing and appropriate heart rate histogram distribution. 4. PM: She has been unable to perform remote downloads and we will see her for pacemaker check every 6 months. 5. HLP: LDL 69 on statin.  She does not have symptomatic coronary or peripheral vascular disease.  Glycemic control is good without medications. 6. Restrictive ventilatory defect: This could well be explained by her huge diaphragmatic hernia, rather than side effects of amiodarone.  I would not entirely exclude the use of amiodarone in the future, but currently this is unnecessary. 7. Weight loss: I wonder whether her poor appetite and early satiety is also related to the large diaphragmatic hernia.   Medication Adjustments/Labs and Tests Ordered: Current medicines are reviewed at length with the patient today.  Concerns regarding medicines are outlined above.  Medication changes, Labs and Tests ordered today are listed in the Patient Instructions below. Patient Instructions  Medication Instructions:  No changes *If you need a refill on your cardiac medications before your next appointment, please call your pharmacy*   Lab Work: None ordered If you have labs (blood work) drawn today and your tests are completely normal, you will receive your results only by: Marland Kitchen MyChart Message (if you have MyChart) OR . A paper copy in the mail If you have any lab test that is abnormal or we need to change your treatment, we will call you to review the  results.   Testing/Procedures: None ordered   Follow-Up: At [facility], you and your health needs are our priority.  As part of our continuing mission to provide you with exceptional heart care, we have created designated Provider Care Teams.  These Care Teams include your primary Cardiologist (physician) and Advanced Practice Providers (APPs -  Physician Assistants and Nurse Practitioners) who all work together to provide you with the care you need, when you need it.  We recommend signing up for the patient portal called "MyChart".  Sign up information is provided on this After Visit Summary.  MyChart is used to connect with patients for Virtual Visits (Telemedicine).  Patients are able to view lab/test results, encounter notes, upcoming appointments, etc.  Non-urgent messages can be sent to your provider as well.   To learn more about what you can do with MyChart, go to [email or web address].    Your next appointment:   6 month(s)  The format for your next appointment:   In Person  Provider:   Sanda Klein, MD          Signed, Sanda Klein, MD  09/27/2020 3:42 PM    [facility] [address], [place], [place]  [address] Phone: [phone]; Fax: [phone]

## 2020-09-28 DIAGNOSIS — G609 Hereditary and idiopathic neuropathy, unspecified: Secondary | ICD-10-CM | POA: Diagnosis not present

## 2020-09-28 DIAGNOSIS — M79676 Pain in unspecified toe(s): Secondary | ICD-10-CM | POA: Diagnosis not present

## 2020-09-28 DIAGNOSIS — B351 Tinea unguium: Secondary | ICD-10-CM | POA: Diagnosis not present

## 2020-09-28 DIAGNOSIS — L84 Corns and callosities: Secondary | ICD-10-CM | POA: Diagnosis not present

## 2020-10-18 DIAGNOSIS — J3801 Paralysis of vocal cords and larynx, unilateral: Secondary | ICD-10-CM | POA: Diagnosis not present

## 2020-10-18 DIAGNOSIS — R490 Dysphonia: Secondary | ICD-10-CM | POA: Diagnosis not present

## 2020-10-22 ENCOUNTER — Other Ambulatory Visit: Payer: Self-pay | Admitting: Otolaryngology

## 2020-10-22 DIAGNOSIS — J3800 Paralysis of vocal cords and larynx, unspecified: Secondary | ICD-10-CM

## 2020-10-25 ENCOUNTER — Ambulatory Visit
Admission: RE | Admit: 2020-10-25 | Discharge: 2020-10-25 | Disposition: A | Discharge status: Home / Self Care | Payer: Medicare PPO | Source: Ambulatory Visit | Attending: Otolaryngology | Admitting: Otolaryngology

## 2020-10-25 DIAGNOSIS — J3800 Paralysis of vocal cords and larynx, unspecified: Secondary | ICD-10-CM

## 2020-10-25 MED ORDER — IOPAMIDOL (ISOVUE-300) INJECTION 61%
75.0000 mL | Freq: Once | INTRAVENOUS | Status: AC | PRN
  Administered 2020-10-25: 75 mL via INTRAVENOUS

## 2020-11-01 ENCOUNTER — Other Ambulatory Visit: Payer: Self-pay | Admitting: Otolaryngology

## 2020-11-01 ENCOUNTER — Other Ambulatory Visit (HOSPITAL_COMMUNITY): Payer: Self-pay | Admitting: Otolaryngology

## 2020-11-01 DIAGNOSIS — E041 Nontoxic single thyroid nodule: Secondary | ICD-10-CM

## 2020-11-03 ENCOUNTER — Ambulatory Visit (HOSPITAL_COMMUNITY)
Admission: RE | Admit: 2020-11-03 | Discharge: 2020-11-03 | Disposition: A | Discharge status: Home / Self Care | Payer: Medicare PPO | Source: Ambulatory Visit | Attending: Otolaryngology | Admitting: Otolaryngology

## 2020-11-03 ENCOUNTER — Other Ambulatory Visit: Payer: Self-pay

## 2020-11-03 DIAGNOSIS — E041 Nontoxic single thyroid nodule: Secondary | ICD-10-CM

## 2020-11-16 DIAGNOSIS — R490 Dysphonia: Secondary | ICD-10-CM | POA: Diagnosis not present

## 2020-11-16 DIAGNOSIS — J3801 Paralysis of vocal cords and larynx, unilateral: Secondary | ICD-10-CM | POA: Diagnosis not present

## 2020-11-17 ENCOUNTER — Encounter (HOSPITAL_COMMUNITY): Payer: Self-pay

## 2020-11-17 ENCOUNTER — Ambulatory Visit (HOSPITAL_COMMUNITY)
Admission: RE | Admit: 2020-11-17 | Discharge: 2020-11-17 | Disposition: A | Discharge status: Home / Self Care | Payer: Medicare PPO | Source: Ambulatory Visit | Attending: Otolaryngology | Admitting: Otolaryngology

## 2020-11-17 ENCOUNTER — Other Ambulatory Visit: Payer: Self-pay

## 2020-11-17 DIAGNOSIS — E041 Nontoxic single thyroid nodule: Secondary | ICD-10-CM

## 2020-11-17 DIAGNOSIS — E0789 Other specified disorders of thyroid: Secondary | ICD-10-CM | POA: Diagnosis not present

## 2020-11-17 MED ORDER — LIDOCAINE HCL (PF) 2 % IJ SOLN
INTRAMUSCULAR | Status: AC
  Administered 2020-11-17: 10 mL
  Filled 2020-11-17: qty 10

## 2020-11-17 NOTE — Sedation Documentation (Signed)
PT tolerated thyroid biopsy procedure well today. Labs obtained and sent for pathology. PT ambulatory at discharge back to waiting room with daughter and had no acute distress noted and verbalized understanding of discharge instructions.

## 2020-11-17 NOTE — Discharge Instructions (Signed)
Thyroid Needle Biopsy, Care After This sheet gives you information about how to care for yourself after your procedure. Your health care provider may also give you more specific instructions. If you have problems or questions, contact your health care provider. What can I expect after the procedure? After the procedure, it is common to have:  Soreness and tenderness that lasts for a few days.  Bruising where the needle was inserted (puncture site). Follow these instructions at home:   Take over-the-counter and prescription medicines only as told by your health care provider.  To help ease discomfort, keep your head raised (elevated) when you are lying down. When you move from lying down to sitting up, use both hands to support the back of your head and neck.  Check your puncture site every day for signs of infection. Check for: ? Redness, swelling, or pain. ? Fluid or blood. ? Warmth. ? Pus or a bad smell.  Return to your normal activities as told by your health care provider. Ask your health care provider what activities are safe for you.  Keep all follow-up visits as told by your health care provider. This is important. Contact a health care provider if:  You have redness, swelling, or pain around your puncture site.  You have fluid or blood coming from your puncture site.  Your puncture site feels warm to the touch.  You have pus or a bad smell coming from your puncture site.  You have a fever. Get help right away if:  You have severe bleeding from the puncture site.  You have difficulty swallowing.  You have swollen glands (lymph nodes) in your neck. Summary  It is common to have some bruising and soreness where the needle was inserted in your lower front neck area (puncture site).  Check your puncture site every day for signs of infection, such as redness, swelling, or pain.  Get help right away if you have severe bleeding from your puncture site. This  information is not intended to replace advice given to you by your health care provider. Make sure you discuss any questions you have with your health care provider. Document Revised: 10/12/2017 Document Reviewed: 08/13/2017 Elsevier Patient Education  2020 [employer]  

## 2020-11-18 LAB — CYTOLOGY - NON PAP

## 2020-11-26 DIAGNOSIS — E041 Nontoxic single thyroid nodule: Secondary | ICD-10-CM | POA: Diagnosis not present

## 2020-12-07 ENCOUNTER — Encounter (HOSPITAL_COMMUNITY): Payer: Self-pay

## 2020-12-10 DIAGNOSIS — Z681 Body mass index (BMI) 19 or less, adult: Secondary | ICD-10-CM | POA: Diagnosis not present

## 2020-12-10 DIAGNOSIS — R42 Dizziness and giddiness: Secondary | ICD-10-CM | POA: Diagnosis not present

## 2021-01-18 DIAGNOSIS — L570 Actinic keratosis: Secondary | ICD-10-CM | POA: Diagnosis not present

## 2021-03-03 DIAGNOSIS — M79676 Pain in unspecified toe(s): Secondary | ICD-10-CM | POA: Diagnosis not present

## 2021-03-03 DIAGNOSIS — L84 Corns and callosities: Secondary | ICD-10-CM | POA: Diagnosis not present

## 2021-03-03 DIAGNOSIS — G609 Hereditary and idiopathic neuropathy, unspecified: Secondary | ICD-10-CM | POA: Diagnosis not present

## 2021-03-03 DIAGNOSIS — B351 Tinea unguium: Secondary | ICD-10-CM | POA: Diagnosis not present

## 2021-03-10 DIAGNOSIS — E782 Mixed hyperlipidemia: Secondary | ICD-10-CM | POA: Diagnosis not present

## 2021-03-10 DIAGNOSIS — I609 Nontraumatic subarachnoid hemorrhage, unspecified: Secondary | ICD-10-CM | POA: Diagnosis not present

## 2021-03-10 DIAGNOSIS — R498 Other voice and resonance disorders: Secondary | ICD-10-CM | POA: Diagnosis not present

## 2021-03-10 DIAGNOSIS — Z0001 Encounter for general adult medical examination with abnormal findings: Secondary | ICD-10-CM | POA: Diagnosis not present

## 2021-03-10 DIAGNOSIS — T462X1A Poisoning by other antidysrhythmic drugs, accidental (unintentional), initial encounter: Secondary | ICD-10-CM | POA: Diagnosis not present

## 2021-03-10 DIAGNOSIS — I495 Sick sinus syndrome: Secondary | ICD-10-CM | POA: Diagnosis not present

## 2021-03-10 DIAGNOSIS — Z1389 Encounter for screening for other disorder: Secondary | ICD-10-CM | POA: Diagnosis not present

## 2021-03-10 DIAGNOSIS — E119 Type 2 diabetes mellitus without complications: Secondary | ICD-10-CM | POA: Diagnosis not present

## 2021-03-10 DIAGNOSIS — E7849 Other hyperlipidemia: Secondary | ICD-10-CM | POA: Diagnosis not present

## 2021-03-10 DIAGNOSIS — I480 Paroxysmal atrial fibrillation: Secondary | ICD-10-CM | POA: Diagnosis not present

## 2021-03-10 DIAGNOSIS — Z681 Body mass index (BMI) 19 or less, adult: Secondary | ICD-10-CM | POA: Diagnosis not present

## 2021-03-10 DIAGNOSIS — E1140 Type 2 diabetes mellitus with diabetic neuropathy, unspecified: Secondary | ICD-10-CM | POA: Diagnosis not present

## 2021-04-09 ENCOUNTER — Other Ambulatory Visit: Payer: Self-pay | Admitting: Urology

## 2021-05-31 DIAGNOSIS — M79605 Pain in left leg: Secondary | ICD-10-CM | POA: Diagnosis not present

## 2021-06-16 DIAGNOSIS — R42 Dizziness and giddiness: Secondary | ICD-10-CM | POA: Diagnosis not present

## 2021-06-16 DIAGNOSIS — Z681 Body mass index (BMI) 19 or less, adult: Secondary | ICD-10-CM | POA: Diagnosis not present

## 2021-07-04 ENCOUNTER — Encounter (HOSPITAL_COMMUNITY): Payer: Self-pay

## 2021-07-04 ENCOUNTER — Emergency Department (HOSPITAL_COMMUNITY): Payer: Medicare PPO

## 2021-07-04 ENCOUNTER — Other Ambulatory Visit: Payer: Self-pay

## 2021-07-04 ENCOUNTER — Emergency Department (HOSPITAL_COMMUNITY)
Admission: EM | Admit: 2021-07-04 | Discharge: 2021-07-05 | Disposition: A | Discharge status: Home / Self Care | Payer: Medicare PPO | Attending: Emergency Medicine | Admitting: Emergency Medicine

## 2021-07-04 DIAGNOSIS — Z950 Presence of cardiac pacemaker: Secondary | ICD-10-CM | POA: Insufficient documentation

## 2021-07-04 DIAGNOSIS — S43032A Inferior subluxation of left humerus, initial encounter: Secondary | ICD-10-CM | POA: Diagnosis not present

## 2021-07-04 DIAGNOSIS — R0902 Hypoxemia: Secondary | ICD-10-CM | POA: Diagnosis not present

## 2021-07-04 DIAGNOSIS — S0181XA Laceration without foreign body of other part of head, initial encounter: Secondary | ICD-10-CM

## 2021-07-04 DIAGNOSIS — Z96642 Presence of left artificial hip joint: Secondary | ICD-10-CM | POA: Insufficient documentation

## 2021-07-04 DIAGNOSIS — Y92239 Unspecified place in hospital as the place of occurrence of the external cause: Secondary | ICD-10-CM | POA: Insufficient documentation

## 2021-07-04 DIAGNOSIS — I517 Cardiomegaly: Secondary | ICD-10-CM | POA: Diagnosis not present

## 2021-07-04 DIAGNOSIS — S43035A Inferior dislocation of left humerus, initial encounter: Secondary | ICD-10-CM | POA: Diagnosis not present

## 2021-07-04 DIAGNOSIS — I10 Essential (primary) hypertension: Secondary | ICD-10-CM | POA: Diagnosis not present

## 2021-07-04 DIAGNOSIS — S51812A Laceration without foreign body of left forearm, initial encounter: Secondary | ICD-10-CM | POA: Insufficient documentation

## 2021-07-04 DIAGNOSIS — Z4789 Encounter for other orthopedic aftercare: Secondary | ICD-10-CM | POA: Diagnosis not present

## 2021-07-04 DIAGNOSIS — S01112A Laceration without foreign body of left eyelid and periocular area, initial encounter: Secondary | ICD-10-CM | POA: Diagnosis not present

## 2021-07-04 DIAGNOSIS — M47812 Spondylosis without myelopathy or radiculopathy, cervical region: Secondary | ICD-10-CM | POA: Diagnosis not present

## 2021-07-04 DIAGNOSIS — S4992XA Unspecified injury of left shoulder and upper arm, initial encounter: Secondary | ICD-10-CM | POA: Diagnosis present

## 2021-07-04 DIAGNOSIS — W01198A Fall on same level from slipping, tripping and stumbling with subsequent striking against other object, initial encounter: Secondary | ICD-10-CM | POA: Insufficient documentation

## 2021-07-04 DIAGNOSIS — M19012 Primary osteoarthritis, left shoulder: Secondary | ICD-10-CM | POA: Diagnosis not present

## 2021-07-04 DIAGNOSIS — S0990XA Unspecified injury of head, initial encounter: Secondary | ICD-10-CM | POA: Insufficient documentation

## 2021-07-04 DIAGNOSIS — I700 Atherosclerosis of aorta: Secondary | ICD-10-CM | POA: Diagnosis not present

## 2021-07-04 DIAGNOSIS — E041 Nontoxic single thyroid nodule: Secondary | ICD-10-CM | POA: Diagnosis not present

## 2021-07-04 DIAGNOSIS — Z79899 Other long term (current) drug therapy: Secondary | ICD-10-CM | POA: Insufficient documentation

## 2021-07-04 DIAGNOSIS — M79603 Pain in arm, unspecified: Secondary | ICD-10-CM | POA: Diagnosis not present

## 2021-07-04 DIAGNOSIS — S51802A Unspecified open wound of left forearm, initial encounter: Secondary | ICD-10-CM | POA: Diagnosis not present

## 2021-07-04 DIAGNOSIS — Z7982 Long term (current) use of aspirin: Secondary | ICD-10-CM | POA: Insufficient documentation

## 2021-07-04 DIAGNOSIS — S199XXA Unspecified injury of neck, initial encounter: Secondary | ICD-10-CM | POA: Diagnosis not present

## 2021-07-04 DIAGNOSIS — R58 Hemorrhage, not elsewhere classified: Secondary | ICD-10-CM | POA: Diagnosis not present

## 2021-07-04 DIAGNOSIS — S43005A Unspecified dislocation of left shoulder joint, initial encounter: Secondary | ICD-10-CM | POA: Diagnosis not present

## 2021-07-04 DIAGNOSIS — S299XXA Unspecified injury of thorax, initial encounter: Secondary | ICD-10-CM | POA: Diagnosis not present

## 2021-07-04 DIAGNOSIS — M25519 Pain in unspecified shoulder: Secondary | ICD-10-CM | POA: Diagnosis not present

## 2021-07-04 DIAGNOSIS — M85832 Other specified disorders of bone density and structure, left forearm: Secondary | ICD-10-CM | POA: Diagnosis not present

## 2021-07-04 DIAGNOSIS — W19XXXA Unspecified fall, initial encounter: Secondary | ICD-10-CM

## 2021-07-04 LAB — BASIC METABOLIC PANEL
Anion gap: 11 (ref 5–15)
BUN: 14 mg/dL (ref 8–23)
CO2: 23 mmol/L (ref 22–32)
Calcium: 8.7 mg/dL — ABNORMAL LOW (ref 8.9–10.3)
Chloride: 102 mmol/L (ref 98–111)
Creatinine, Ser: 1.17 mg/dL — ABNORMAL HIGH (ref 0.44–1.00)
GFR, Estimated: 46 mL/min — ABNORMAL LOW (ref >60)
Glucose, Bld: 115 mg/dL — ABNORMAL HIGH (ref 70–99)
Potassium: 5.1 mmol/L (ref 3.5–5.1)
Sodium: 136 mmol/L (ref 135–145)

## 2021-07-04 LAB — CBC WITH DIFFERENTIAL/PLATELET
Abs Immature Granulocytes: 0.03 10*3/uL (ref 0.00–0.07)
Basophils Absolute: 0.1 10*3/uL (ref 0.0–0.1)
Basophils Relative: 1 %
Eosinophils Absolute: 0.3 10*3/uL (ref 0.0–0.5)
Eosinophils Relative: 3 %
HCT: 43.6 % (ref 36.0–46.0)
Hemoglobin: 13.7 g/dL (ref 12.0–15.0)
Immature Granulocytes: 0 %
Lymphocytes Relative: 22 %
Lymphs Abs: 2.5 10*3/uL (ref 0.7–4.0)
MCH: 33.3 pg (ref 26.0–34.0)
MCHC: 31.4 g/dL (ref 30.0–36.0)
MCV: 105.8 fL — ABNORMAL HIGH (ref 80.0–100.0)
Monocytes Absolute: 1.1 10*3/uL — ABNORMAL HIGH (ref 0.1–1.0)
Monocytes Relative: 10 %
Neutro Abs: 7.3 10*3/uL (ref 1.7–7.7)
Neutrophils Relative %: 64 %
Platelets: 196 10*3/uL (ref 150–400)
RBC: 4.12 MIL/uL (ref 3.87–5.11)
RDW: 12.7 % (ref 11.5–15.5)
WBC: 11.4 10*3/uL — ABNORMAL HIGH (ref 4.0–10.5)
nRBC: 0 % (ref 0.0–0.2)

## 2021-07-04 MED ORDER — ONDANSETRON HCL 4 MG/2ML IJ SOLN
4.0000 mg | Freq: Once | INTRAMUSCULAR | Status: AC
  Administered 2021-07-04: 4 mg via INTRAVENOUS

## 2021-07-04 MED ORDER — ONDANSETRON HCL 4 MG/2ML IJ SOLN: 4.0000 mg | Freq: Once | INTRAMUSCULAR | Status: AC

## 2021-07-04 MED ORDER — HYDROMORPHONE HCL 1 MG/ML IJ SOLN
0.5000 mg | Freq: Once | INTRAMUSCULAR | Status: AC
  Administered 2021-07-04: 0.5 mg via INTRAVENOUS
  Filled 2021-07-04: qty 1

## 2021-07-04 MED ORDER — ONDANSETRON HCL 4 MG/2ML IJ SOLN
INTRAMUSCULAR | Status: AC
  Administered 2021-07-04: 4 mg via INTRAVENOUS
  Filled 2021-07-04: qty 2

## 2021-07-04 MED ORDER — PROPOFOL 10 MG/ML IV BOLUS
50.0000 mg | Freq: Once | INTRAVENOUS | Status: AC
  Administered 2021-07-04: 25 mg via INTRAVENOUS
  Filled 2021-07-04: qty 20

## 2021-07-04 MED ORDER — FENTANYL CITRATE PF 50 MCG/ML IJ SOSY
50.0000 ug | PREFILLED_SYRINGE | Freq: Once | INTRAMUSCULAR | Status: AC
  Administered 2021-07-04: 50 ug via INTRAVENOUS
  Filled 2021-07-04: qty 1

## 2021-07-04 NOTE — ED Notes (Signed)
Received verbal report from Catherine W. RN at this time 

## 2021-07-04 NOTE — ED Notes (Signed)
Provider at bedside at this time

## 2021-07-04 NOTE — ED Provider Notes (Signed)
[facility] EMERGENCY DEPARTMENT Provider Note   CSN: [id number] Arrival date & time: 07/04/21  1919     History No chief complaint on file.   Anne Anthony is a 85 y.o. female brought in by EMS after fall.  The patient had a fall onto her left side.  EMS reports that her left arm has been up behind her head and she has been unable to move it since that time.  Patient states that she lost her footing and fell.  She did hit the front of her head and has a small laceration.  She is unsure of her last tetanus vaccination.  She did not lose consciousness. She does not take a blood thinner. She complains of sever pain and numbness in her left arm.  HPI     Past Medical History:  Diagnosis Date   Atrial flutter ([place])    successful radiofrequency ablation 2007   Diverticula of colon    Family history of colon cancer    brother   Hiatal hernia    Hyperlipidemia    Hypertension    Pacemaker    Paroxysmal atrial fibrillation ([place])    Severe protein-calorie malnutrition ([place]) 11/17/2014   Tachycardia-bradycardia syndrome ([place])    permament pacermaker, dual chamber Medtronic on 06/2011   Tubular adenoma     Patient Active Problem List   Diagnosis Date Noted   Supratherapeutic INR    SAH (subarachnoid hemorrhage) ([place]) 01/31/2018   Pleural effusion on left 01/31/2018   Restrictive lung disease 05/24/2017   Restrictive ventilatory defect 04/28/2016   Nasal congestion 12/01/2014   Postoperative anemia due to acute blood loss 11/23/2014   Protein-calorie malnutrition, severe ([place]) 11/17/2014   Severe protein-calorie malnutrition ([place]) 11/17/2014   Anticoagulated on Coumadin    Left displaced femoral neck fracture ([place])    Hip fracture ([place]) 11/14/2014   Closed left hip fracture ([place]) 11/14/2014   Back pain 05/14/2014   Paroxysmal atrial fibrillation ([place])    Tachycardia-bradycardia syndrome ([place])    Hypertension    Hyperlipidemia    Pacemaker    GERD  08/17/2010    Past Surgical History:  Procedure Laterality Date   ABDOMINAL SURGERY     COLONOSCOPY  08/17/2010   Dr. Gala Romney- internal hemorrhoids, o/w normal rectum, L sided diverticula, tubular adenoma   ESOPHAGOGASTRODUODENOSCOPY  10/502011   Dr. Gala Romney- huge diaphragmatic/hiatial hernia, fundal gland polyps not manipulated o/w noral appearing gastric mucosa, patent pylorus, normal D1 & D2   HIATAL HERNIA REPAIR  2005   HIP ARTHROPLASTY Left 11/16/2014   Procedure: ARTHROPLASTY BIPOLAR HIP;  Surgeon: Mauri Pole, MD;  Location: [facility] ORS;  Service: Orthopedics;  Laterality: Left;   PARTIAL HYSTERECTOMY     permament pacemaker  06/2011   dual chamber , Medtronic Adapta serial # [id number]  last checked 02/05/2013   RADIOFREQUENCY ABLATION  06/28/2006     OB History   No obstetric history on file.     Family History  Problem Relation Age of Onset   Diabetes Mother     Social History   Tobacco Use   Smoking status: Never   Smokeless tobacco: Never  Vaping Use   Vaping Use: Never used  Substance Use Topics   Alcohol use: No   Drug use: No    Home Medications Prior to Admission medications   Medication Sig Start Date End Date Taking? Authorizing Provider  acetaminophen (TYLENOL) 325 MG tablet Take 1-2 tablets (325-650 mg total) by mouth  every 4 (four) hours as needed for fever, headache or mild pain. 11/18/14   Danae Orleans, PA-C  aspirin EC 81 MG tablet Take 81 mg by mouth at bedtime.    [provider]  dexlansoprazole (DEXILANT) 60 MG capsule Take 1 capsule (60 mg total) by mouth daily. 09/14/17   Croitoru, Mihai, MD  escitalopram (LEXAPRO) 10 MG tablet Take 5 mg by mouth daily. 09/06/18   [provider]  gabapentin (NEURONTIN) 300 MG capsule 1 cap at bedtime Patient taking differently: Take 300 mg by mouth 2 (two) times daily. 1 cap at bedtime 06/28/15   Cameron Sprang, MD  metoprolol tartrate (LOPRESSOR) 25 MG tablet Take 25 mg by mouth 2 (two) times  daily. 10/02/14   [provider]  simvastatin (ZOCOR) 20 MG tablet Take 20 mg by mouth every evening.    [provider]  trimethoprim (TRIMPEX) 100 MG tablet TAKE 1 TABLET BY MOUTH AT BEDTIME 04/12/21   McKenzie, Candee Furbish, MD  Vitamin D, Ergocalciferol, (DRISDOL) 1.25 MG (50000 UT) CAPS capsule TAKE 1 CAPSULE BY MOUTH EACH WEEKLY- AFTER FINISHING CHANGE TO OTC 2000 IU DAILY 10/09/18   [provider]    Allergies    Morphine and related  Review of Systems   Review of Systems Ten systems reviewed and are negative for acute change, except as noted in the HPI.   Physical Exam Updated Vital Signs There were no vitals taken for this visit.  Physical Exam Vitals and nursing note reviewed.  Constitutional:      General: She is not in acute distress.    Appearance: She is well-developed. She is not diaphoretic.  HENT:     Head: Normocephalic and atraumatic.     Comments: Small laceration to the left brow    Right Ear: External ear normal.     Left Ear: External ear normal.     Nose: Nose normal.     Mouth/Throat:     Mouth: Mucous membranes are moist.  Eyes:     General: No scleral icterus.    Conjunctiva/sclera: Conjunctivae normal.  Cardiovascular:     Rate and Rhythm: Normal rate and regular rhythm.     Heart sounds: Normal heart sounds. No murmur heard.   No friction rub. No gallop.  Pulmonary:     Effort: Pulmonary effort is normal. No respiratory distress.     Breath sounds: Normal breath sounds.  Abdominal:     General: Bowel sounds are normal. There is no distension.     Palpations: Abdomen is soft. There is no mass.     Tenderness: There is no abdominal tenderness. There is no guarding.  Musculoskeletal:     Cervical back: Normal range of motion.     Comments: Left arm is behind the patient's head. The head of the humerus is prominent appearing and palpable in the left Axilla. Patient unable to move the left arm from the position without  severe pain . Thready pulse  Skin:    General: Skin is warm and dry.  Neurological:     Mental Status: She is alert and oriented to person, place, and time.  Psychiatric:        Behavior: Behavior normal.    ED Results / Procedures / Treatments   Labs (all labs ordered are listed, but only abnormal results are displayed) Labs Reviewed - No data to display  EKG None  Radiology No results found.  Procedures Reduction of dislocation  Date/Time: 07/04/2021 10:14  PM Performed by: Margarita Mail, PA-C Authorized by: Margarita Mail, PA-C  Consent: Written consent obtained. Risks and benefits: risks, benefits and alternatives were discussed Consent given by: patient Patient understanding: patient states understanding of the procedure being performed Patient consent: the patient's understanding of the procedure matches consent given Procedure consent: procedure consent matches procedure scheduled Site marked: the operative site was marked Imaging studies: imaging studies available Patient identity confirmed: verbally with patient and arm band Time out: Immediately prior to procedure a "time out" was called to verify the correct patient, procedure, equipment, support staff and site/side marked as required. Preparation: Patient was prepped and draped in the usual sterile fashion. Local anesthesia used: no  Anesthesia: Local anesthesia used: no  Sedation: Patient sedated: yes (see note per Dr. Varney Biles)  Patient tolerance: patient tolerated the procedure well with no immediate complications   .Marland KitchenLaceration Repair  Date/Time: 07/04/2021 10:16 PM Performed by: Margarita Mail, PA-C Authorized by: Margarita Mail, PA-C   Consent:    Consent obtained:  Verbal   Consent given by:  Patient   Risks discussed:  Infection, need for additional repair, pain, poor cosmetic result and poor wound healing   Alternatives discussed:  No treatment and delayed treatment Universal  protocol:    Procedure explained and questions answered to patient or proxy's satisfaction: yes     Relevant documents present and verified: yes     Test results available: yes     Imaging studies available: yes     Required blood products, implants, devices, and special equipment available: yes     Site/side marked: yes     Immediately prior to procedure, a time out was called: yes     Patient identity confirmed:  Arm band Anesthesia:    Anesthesia method:  None Laceration details:    Location:  Face   Face location:  Forehead   Medications Ordered in ED Medications - No data to display  ED Course  I have reviewed the triage vital signs and the nursing notes.  Pertinent labs & imaging results that were available during my care of the patient were reviewed by me and considered in my medical decision making (see chart for details).  Clinical Course as of 07/04/21 2212  Mon Jul 04, 2021  2035 Patient appears to have an inferior dislocation of the left humerus. I have placed a call to consult with Orthopedics  [AH]    Clinical Course User Index [AH] Margarita Mail, PA-C   MDM Rules/Calculators/A&P                         Patient here after fall.  She had an inferior shoulder dislocation which I was able to successfully reduce.  I post reduction film I personally reviewed shows interval reduction of inferior dislocation.  I ordered and reviewed a CT head and C-spine, there are no acute findings on the head or C-spine imaging.  Chest CT shows no acute abnormalities.  Patient's laceration repaired. Discussed outpatient follow up and return precautions.  Final Clinical Impression(s) / ED Diagnoses Final diagnoses:  None    Rx / DC Orders ED Discharge Orders     None        Margarita Mail, PA-C 07/05/21 [place], Ankit, MD 07/05/21 [phone]

## 2021-07-04 NOTE — ED Notes (Signed)
Provider at bedside

## 2021-07-04 NOTE — Discharge Instructions (Signed)
Contact a health care provider if: Your brace or sling gets damaged. Get help right away if: Your pain gets worse rather than better. You lose feeling in your arm or hand. Your arm or hand becomes white and cold.

## 2021-07-04 NOTE — ED Triage Notes (Signed)
Pt had fall from standing with injury to left arm. Pt also has a left lac to forehead  EMS VS BP 174/94  Pt AxO per EMS

## 2021-07-05 ENCOUNTER — Other Ambulatory Visit: Payer: Self-pay

## 2021-07-05 ENCOUNTER — Emergency Department (HOSPITAL_BASED_OUTPATIENT_CLINIC_OR_DEPARTMENT_OTHER)
Admission: EM | Admit: 2021-07-05 | Discharge: 2021-07-05 | Disposition: A | Discharge status: Home / Self Care | Payer: Medicare PPO | Source: Home / Self Care | Attending: Emergency Medicine | Admitting: Emergency Medicine

## 2021-07-05 ENCOUNTER — Emergency Department (HOSPITAL_BASED_OUTPATIENT_CLINIC_OR_DEPARTMENT_OTHER): Payer: Medicare PPO

## 2021-07-05 ENCOUNTER — Encounter (HOSPITAL_BASED_OUTPATIENT_CLINIC_OR_DEPARTMENT_OTHER): Payer: Self-pay

## 2021-07-05 DIAGNOSIS — Z8679 Personal history of other diseases of the circulatory system: Secondary | ICD-10-CM | POA: Insufficient documentation

## 2021-07-05 DIAGNOSIS — W19XXXA Unspecified fall, initial encounter: Secondary | ICD-10-CM | POA: Insufficient documentation

## 2021-07-05 DIAGNOSIS — M85832 Other specified disorders of bone density and structure, left forearm: Secondary | ICD-10-CM | POA: Diagnosis not present

## 2021-07-05 DIAGNOSIS — S51812A Laceration without foreign body of left forearm, initial encounter: Secondary | ICD-10-CM

## 2021-07-05 DIAGNOSIS — Z79899 Other long term (current) drug therapy: Secondary | ICD-10-CM | POA: Insufficient documentation

## 2021-07-05 DIAGNOSIS — S51802A Unspecified open wound of left forearm, initial encounter: Secondary | ICD-10-CM | POA: Diagnosis not present

## 2021-07-05 DIAGNOSIS — Y92239 Unspecified place in hospital as the place of occurrence of the external cause: Secondary | ICD-10-CM | POA: Insufficient documentation

## 2021-07-05 DIAGNOSIS — Z950 Presence of cardiac pacemaker: Secondary | ICD-10-CM | POA: Insufficient documentation

## 2021-07-05 DIAGNOSIS — Z7982 Long term (current) use of aspirin: Secondary | ICD-10-CM | POA: Insufficient documentation

## 2021-07-05 DIAGNOSIS — Z96642 Presence of left artificial hip joint: Secondary | ICD-10-CM | POA: Insufficient documentation

## 2021-07-05 DIAGNOSIS — I10 Essential (primary) hypertension: Secondary | ICD-10-CM | POA: Insufficient documentation

## 2021-07-05 MED ORDER — LIDOCAINE-EPINEPHRINE (PF) 2 %-1:200000 IJ SOLN
20.0000 mL | Freq: Once | INTRAMUSCULAR | Status: AC
  Administered 2021-07-05: 20 mL
  Filled 2021-07-05: qty 20

## 2021-07-05 NOTE — ED Triage Notes (Signed)
Pt is here with c/o large skin tear to left forearm. She was just seen at [facility] for a fall where she dislocated her left shoulder. [place] reduced it there and she was then d/c, got home and noticed the skin tear.

## 2021-07-05 NOTE — Discharge Instructions (Addendum)
You were seen today for a skin tear.  This was repaired.  The skin may actually not be viable.  Apply antibiotic ointment.  Keep the wound dressed with a nonadherent dressing and compressed.  Have sutures removed in 10 to 14 days.  Follow-up with your primary physician this week for wound recheck.

## 2021-07-05 NOTE — ED Provider Notes (Signed)
[facility] EMERGENCY DEPARTMENT Provider Note   CSN: [id number] Arrival date & time: 07/05/21  0250     History Chief Complaint  Patient presents with   Skin tear    Anne Anthony is a 85 y.o. female.  HPI     This is an 85 year old female with a history of atrial flutter, hypertension, hyperlipidemia who presents with skin tear to the left forearm.  She was just discharged from [facility].  She had a fall earlier yesterday evening where she had an inferior left shoulder dislocation and a minor head injury with a laceration to the forehead.  Shoulder was relocated while in the emergency room and laceration was repaired.  Upon return home, daughter noted significant amount of blood on the patient's bra and shirt.  She removed her shirt by cutting it and noted a large skin tear of the left forearm.  She is unsure whether the skin tear was there after her fall or during her previous ED visit.  Tetanus is up-to-date.  She denies any other injury.  She reports moderate pain to the skin tear site.  No numbness or tingling in the hand.  Of note the skin tear he is on the side of the dislocation.  Past Medical History:  Diagnosis Date   Atrial flutter ([place])    successful radiofrequency ablation 2007   Diverticula of colon    Family history of colon cancer    brother   Hiatal hernia    Hyperlipidemia    Hypertension    Pacemaker    Paroxysmal atrial fibrillation ([place])    Severe protein-calorie malnutrition ([place]) 11/17/2014   Tachycardia-bradycardia syndrome ([place])    permament pacermaker, dual chamber Medtronic on 06/2011   Tubular adenoma     Patient Active Problem List   Diagnosis Date Noted   Supratherapeutic INR    SAH (subarachnoid hemorrhage) ([place]) 01/31/2018   Pleural effusion on left 01/31/2018   Restrictive lung disease 05/24/2017   Restrictive ventilatory defect 04/28/2016   Nasal congestion 12/01/2014   Postoperative anemia due to acute blood loss  11/23/2014   Protein-calorie malnutrition, severe ([place]) 11/17/2014   Severe protein-calorie malnutrition ([place]) 11/17/2014   Anticoagulated on Coumadin    Left displaced femoral neck fracture ([place])    Hip fracture ([place]) 11/14/2014   Closed left hip fracture ([place]) 11/14/2014   Back pain 05/14/2014   Paroxysmal atrial fibrillation ([place])    Tachycardia-bradycardia syndrome ([place])    Hypertension    Hyperlipidemia    Pacemaker    GERD 08/17/2010    Past Surgical History:  Procedure Laterality Date   ABDOMINAL SURGERY     COLONOSCOPY  08/17/2010   Dr. Gala Romney- internal hemorrhoids, o/w normal rectum, L sided diverticula, tubular adenoma   ESOPHAGOGASTRODUODENOSCOPY  10/502011   Dr. Gala Romney- huge diaphragmatic/hiatial hernia, fundal gland polyps not manipulated o/w noral appearing gastric mucosa, patent pylorus, normal D1 & D2   HIATAL HERNIA REPAIR  2005   HIP ARTHROPLASTY Left 11/16/2014   Procedure: ARTHROPLASTY BIPOLAR HIP;  Surgeon: Mauri Pole, MD;  Location: [facility] ORS;  Service: Orthopedics;  Laterality: Left;   PARTIAL HYSTERECTOMY     permament pacemaker  06/2011   dual chamber , Medtronic Adapta serial # [id number]  last checked 02/05/2013   RADIOFREQUENCY ABLATION  06/28/2006     OB History   No obstetric history on file.     Family History  Problem Relation Age of Onset   Diabetes Mother  Social History   Tobacco Use   Smoking status: Never   Smokeless tobacco: Never  Vaping Use   Vaping Use: Never used  Substance Use Topics   Alcohol use: No   Drug use: No    Home Medications Prior to Admission medications   Medication Sig Start Date End Date Taking? Authorizing Provider  acetaminophen (TYLENOL) 325 MG tablet Take 1-2 tablets (325-650 mg total) by mouth every 4 (four) hours as needed for fever, headache or mild pain. 11/18/14   Danae Orleans, PA-C  aspirin EC 81 MG tablet Take 81 mg by mouth at bedtime.    [provider]  dexlansoprazole (DEXILANT)  60 MG capsule Take 1 capsule (60 mg total) by mouth daily. 09/14/17   Croitoru, Mihai, MD  escitalopram (LEXAPRO) 10 MG tablet Take 5 mg by mouth daily. 09/06/18   [provider]  gabapentin (NEURONTIN) 300 MG capsule 1 cap at bedtime Patient taking differently: Take 300 mg by mouth 2 (two) times daily. 1 cap at bedtime 06/28/15   Cameron Sprang, MD  gabapentin (NEURONTIN) 600 MG tablet Take 600 mg by mouth 4 (four) times daily. 07/01/21   [provider]  meclizine (ANTIVERT) 25 MG tablet Take 25 mg by mouth every 6 (six) hours as needed for dizziness. 06/16/21   [provider]  metoprolol tartrate (LOPRESSOR) 25 MG tablet Take 25 mg by mouth 2 (two) times daily. 10/02/14   [provider]  simvastatin (ZOCOR) 20 MG tablet Take 20 mg by mouth every evening.    [provider]  trimethoprim (TRIMPEX) 100 MG tablet TAKE 1 TABLET BY MOUTH AT BEDTIME 04/12/21   McKenzie, Candee Furbish, MD  Vitamin D, Ergocalciferol, (DRISDOL) 1.25 MG (50000 UT) CAPS capsule TAKE 1 CAPSULE BY MOUTH EACH WEEKLY- AFTER FINISHING CHANGE TO OTC 2000 IU DAILY 10/09/18   [provider]    Allergies    Morphine and related  Review of Systems   Review of Systems  Constitutional:  Negative for fever.  Skin:  Positive for wound.  All other systems reviewed and are negative.  Physical Exam Updated Vital Signs BP (!) 145/62 (BP Location: Right Arm)   Pulse 70   Temp 97.7 F (36.5 C) (Oral)   Resp 16   Ht 1.626 m ('5\' 4"'$ )   Wt 47.6 kg   SpO2 91%   BMI 18.02 kg/m   Physical Exam Vitals and nursing note reviewed.  Constitutional:      Appearance: She is well-developed.     Comments: Elderly, nontoxic-appearing, ABCs intact  HENT:     Head: Normocephalic.     Comments: Laceration left forehead    Nose: Nose normal.     Mouth/Throat:     Mouth: Mucous membranes are moist.  Eyes:     Pupils: Pupils are equal, round, and reactive to light.  Cardiovascular:      Rate and Rhythm: Normal rate and regular rhythm.     Heart sounds: Normal heart sounds.  Pulmonary:     Effort: Pulmonary effort is normal. No respiratory distress.     Breath sounds: No wheezing.  Abdominal:     General: Bowel sounds are normal.     Palpations: Abdomen is soft.  Musculoskeletal:     Cervical back: Neck supple.     Comments: Left shoulder in an adducted position, sling in place  Skin:    General: Skin is warm and dry.     Comments: Large 14 cm skin  tear just distal to the left elbow on the anterior aspect of the arm, no active bleeding, no underlying deformities noted  Neurological:     Mental Status: She is alert and oriented to person, place, and time.  Psychiatric:        Mood and Affect: Mood normal.     ED Results / Procedures / Treatments   Labs (all labs ordered are listed, but only abnormal results are displayed) Labs Reviewed - No data to display  EKG None  Radiology DG Forearm Left  Result Date: 07/05/2021 CLINICAL DATA:  Skin tear of the left upper extremity. EXAM: LEFT FOREARM - 2 VIEW COMPARISON:  None. FINDINGS: There is no acute fracture or dislocation. The bones are osteopenic. There is degenerative changes of the base of the thumb. There is irregularity of the skin of the volar distal forearm. No radiopaque foreign object or soft tissue gas. IMPRESSION: 1. No acute fracture or dislocation. 2. Osteopenia. Electronically Signed   By: Anner Crete M.D.   On: 07/05/2021 03:42   CT HEAD WO CONTRAST (5MM)  Result Date: 07/04/2021 CLINICAL DATA:  Head trauma, mod-severe; Neck trauma (Age >= 65y). EXAM: CT HEAD WITHOUT CONTRAST CT CERVICAL SPINE WITHOUT CONTRAST TECHNIQUE: Multidetector CT imaging of the head and cervical spine was performed following the standard protocol without intravenous contrast. Multiplanar CT image reconstructions of the cervical spine were also generated. COMPARISON:  None. FINDINGS: CT HEAD FINDINGS Brain: Normal anatomic  configuration. Parenchymal volume loss is commensurate with the patient's age. Mild periventricular white matter changes are present likely reflecting the sequela of small vessel ischemia. No abnormal intra or extra-axial mass lesion or fluid collection. No abnormal mass effect or midline shift. No evidence of acute intracranial hemorrhage or infarct. Ventricular size is normal. Cerebellum unremarkable. Vascular: No asymmetric hyperdense vasculature at the skull base. Skull: Intact Sinuses/Orbits: Paranasal sinuses are clear. Orbits are unremarkable. Other: Mastoid air cells and middle ear cavities are clear. CT CERVICAL SPINE FINDINGS Alignment: Normal. Skull base and vertebrae: The craniocervical alignment is normal. The atlantodental interval is not widened. Degenerative changes are seen at the atlantodental articulation. There is no acute fracture of the cervical spine. Trabecular coarsening and lucency involving the C7 vertebral body is in keeping with a vertebral hemangioma. Bilateral C7 cervical ribs are noted. Soft tissues and spinal canal: No prevertebral fluid or swelling. No visible canal hematoma. Disc levels: There is intervertebral disc space narrowing and endplate remodeling of 624THL in keeping with changes of advanced degenerative disc disease. Additional degenerative changes are noted at T1-T3. The prevertebral soft tissues are not thickened on sagittal reformats. Review of the axial images demonstrates multilevel uncovertebral and facet arthrosis resulting in multilevel moderate to severe is a neuroforaminal narrowing, most severe bilaterally at C3-4 and C4-5. No significant canal stenosis. Upper chest: 2.8 cm left thyroid nodule was better assessed on prior thyroid sonogram of 10/25/2020 and biopsied on 11/03/2020. Mild scarring noted within the left apex. Other: None IMPRESSION: No acute intracranial injury.  No calvarial fracture. No acute fracture or listhesis of the cervical spine.  Electronically Signed   By: Fidela Salisbury M.D.   On: 07/04/2021 22:56   CT Cervical Spine Wo Contrast  Result Date: 07/04/2021 CLINICAL DATA:  Head trauma, mod-severe; Neck trauma (Age >= 65y). EXAM: CT HEAD WITHOUT CONTRAST CT CERVICAL SPINE WITHOUT CONTRAST TECHNIQUE: Multidetector CT imaging of the head and cervical spine was performed following the standard protocol without intravenous contrast. Multiplanar CT image reconstructions of  the cervical spine were also generated. COMPARISON:  None. FINDINGS: CT HEAD FINDINGS Brain: Normal anatomic configuration. Parenchymal volume loss is commensurate with the patient's age. Mild periventricular white matter changes are present likely reflecting the sequela of small vessel ischemia. No abnormal intra or extra-axial mass lesion or fluid collection. No abnormal mass effect or midline shift. No evidence of acute intracranial hemorrhage or infarct. Ventricular size is normal. Cerebellum unremarkable. Vascular: No asymmetric hyperdense vasculature at the skull base. Skull: Intact Sinuses/Orbits: Paranasal sinuses are clear. Orbits are unremarkable. Other: Mastoid air cells and middle ear cavities are clear. CT CERVICAL SPINE FINDINGS Alignment: Normal. Skull base and vertebrae: The craniocervical alignment is normal. The atlantodental interval is not widened. Degenerative changes are seen at the atlantodental articulation. There is no acute fracture of the cervical spine. Trabecular coarsening and lucency involving the C7 vertebral body is in keeping with a vertebral hemangioma. Bilateral C7 cervical ribs are noted. Soft tissues and spinal canal: No prevertebral fluid or swelling. No visible canal hematoma. Disc levels: There is intervertebral disc space narrowing and endplate remodeling of 624THL in keeping with changes of advanced degenerative disc disease. Additional degenerative changes are noted at T1-T3. The prevertebral soft tissues are not thickened on sagittal  reformats. Review of the axial images demonstrates multilevel uncovertebral and facet arthrosis resulting in multilevel moderate to severe is a neuroforaminal narrowing, most severe bilaterally at C3-4 and C4-5. No significant canal stenosis. Upper chest: 2.8 cm left thyroid nodule was better assessed on prior thyroid sonogram of 10/25/2020 and biopsied on 11/03/2020. Mild scarring noted within the left apex. Other: None IMPRESSION: No acute intracranial injury.  No calvarial fracture. No acute fracture or listhesis of the cervical spine. Electronically Signed   By: Fidela Salisbury M.D.   On: 07/04/2021 22:56   DG Chest Port 1 View  Result Date: 07/04/2021 CLINICAL DATA:  [place] from standing position. Left shoulder dislocation. EXAM: PORTABLE CHEST 1 VIEW COMPARISON:  01/31/2018 FINDINGS: Cardiac enlargement. Cardiac pacemaker. Calcified and tortuous aorta. There is a large amount of bowel demonstrated in the chest. This may be due to severe elevation of the left hemidiaphragm or diaphragmatic hernia. Appearances are similar to prior study. There is suggestion of infiltration or atelectasis in the left lung base. No pneumothorax. Visualized ribs are nondisplaced. Degenerative changes in the spine. Inferior dislocation of the left shoulder is incidentally noted. See additional report. IMPRESSION: Cardiac enlargement. Large amount of bowel gas in the chest suggesting severe elevation of the hemidiaphragms or diaphragmatic hernia. No change in position since prior study. Infiltration or atelectasis in the left base. Electronically Signed   By: Lucienne Capers M.D.   On: 07/04/2021 20:13   DG Shoulder Left Portable  Result Date: 07/04/2021 CLINICAL DATA:  Postreduction EXAM: LEFT SHOULDER COMPARISON:  07/04/2021 FINDINGS: Interval reduction of the previously seen dislocated left glenohumeral joint. No visible fracture. Degenerative changes in the [facility] and glenohumeral joints. IMPRESSION: Interval reduction.  No  visible fracture. Electronically Signed   By: Rolm Baptise M.D.   On: 07/04/2021 22:54   DG Shoulder Left Port  Result Date: 07/04/2021 CLINICAL DATA:  Query posterior dislocation. Fell from standing with injury to the left arm. No range of motion. EXAM: LEFT SHOULDER COMPARISON:  11/28/2005 FINDINGS: Inferior dislocation of the left glenohumeral joint with superior extension of the arm consistent with luxatio erecta. No acute fractures are demonstrated. Cardiac pacemaker. 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 Electronically Signed   By:  Lucienne Capers M.D.   On: 07/04/2021 20:11    Procedures .Marland KitchenLaceration Repair  Date/Time: 07/05/2021 4:26 AM Performed by: Merryl Hacker, MD Authorized by: Merryl Hacker, MD   Consent:    Consent obtained:  Verbal   Consent given by:  Patient   Risks discussed:  Infection, pain and poor cosmetic result   Alternatives discussed:  No treatment Anesthesia:    Anesthesia method:  Local infiltration   Local anesthetic:  Lidocaine 2% WITH epi Laceration details:    Location:  Shoulder/arm   Shoulder/arm location:  L lower arm   Length (cm):  14   Depth (mm):  2 Pre-procedure details:    Preparation:  Patient was prepped and draped in usual sterile fashion Exploration:    Wound extent: no foreign bodies/material noted, no muscle damage noted, no underlying fracture noted and no vascular damage noted     Contaminated: no   Treatment:    Area cleansed with:  Povidone-iodine   Amount of cleaning:  Standard   Irrigation solution:  Sterile saline   Debridement:  None   Undermining:  None   Scar revision: no   Skin repair:    Repair method:  Sutures   Suture size:  4-0   Suture material:  Nylon   Suture technique:  Simple interrupted and horizontal mattress   Number of sutures:  5 Approximation:    Approximation:  Close Repair type:    Repair type:  Intermediate Post-procedure details:     Dressing:  Antibiotic ointment, non-adherent dressing and bulky dressing   Procedure completion:  Tolerated Comments:     Steri-Strips used to reinforce thin skin over skin tear flap   Medications Ordered in ED Medications  lidocaine-EPINEPHrine (XYLOCAINE W/EPI) 2 %-1:200000 (PF) injection 20 mL (has no administration in time range)    ED Course  I have reviewed the triage vital signs and the nursing notes.  Pertinent labs & imaging results that were available during my care of the patient were reviewed by me and considered in my medical decision making (see chart for details).    MDM Rules/Calculators/A&P                           Patient presents with a large skin tear to the left forearm.  It is unclear when she acquired this with this was during her previous fall or could have theoretically been during reduction of her left shoulder.  Daughter did have a photo of her arm after the fall and the sugar was raised to the mid forearm.  I did not note the skin tear in that picture.  Regardless, she is a large skin tear.  X-rays obtained and no underlying fracture.  We discussed that the flap may not be viable.  However, I attempted to use the flap as a biological Band-Aid.  I was able to tack this down with sutures and gained good approximation.  Antibiotic ointment was applied.  Nonadherent dressing and pressure dressing applied.  Suture removal in 10 to 14 days.  Advise daughter of good wound care instructions.  She needs to follow-up this week with primary physician for recheck of wound.  I also advised the daughter of the possibility of a nonviable flap given the extent of the skin tear.  Daughter stated understanding.  After history, exam, and medical workup I feel the patient has been appropriately medically screened and is safe for discharge home. Pertinent  diagnoses were discussed with the patient. Patient was given return precautions.  Final Clinical Impression(s) / ED  Diagnoses Final diagnoses:  Skin tear of left forearm without complication, initial encounter    Rx / DC Orders ED Discharge Orders     None        Manda Holstad, Barbette Hair, MD 07/05/21 [phone]

## 2021-07-05 NOTE — ED Notes (Signed)
Dr. Horton at bedside. 

## 2021-07-12 DIAGNOSIS — M1990 Unspecified osteoarthritis, unspecified site: Secondary | ICD-10-CM | POA: Diagnosis not present

## 2021-07-12 DIAGNOSIS — K219 Gastro-esophageal reflux disease without esophagitis: Secondary | ICD-10-CM | POA: Diagnosis not present

## 2021-07-12 DIAGNOSIS — I10 Essential (primary) hypertension: Secondary | ICD-10-CM | POA: Diagnosis not present

## 2021-07-12 DIAGNOSIS — E785 Hyperlipidemia, unspecified: Secondary | ICD-10-CM | POA: Diagnosis not present

## 2021-07-12 DIAGNOSIS — E559 Vitamin D deficiency, unspecified: Secondary | ICD-10-CM | POA: Diagnosis not present

## 2021-07-12 DIAGNOSIS — I739 Peripheral vascular disease, unspecified: Secondary | ICD-10-CM | POA: Diagnosis not present

## 2021-07-12 DIAGNOSIS — F329 Major depressive disorder, single episode, unspecified: Secondary | ICD-10-CM | POA: Diagnosis not present

## 2021-07-12 DIAGNOSIS — G8929 Other chronic pain: Secondary | ICD-10-CM | POA: Diagnosis not present

## 2021-07-12 DIAGNOSIS — G629 Polyneuropathy, unspecified: Secondary | ICD-10-CM | POA: Diagnosis not present

## 2021-07-19 DIAGNOSIS — S43035D Inferior dislocation of left humerus, subsequent encounter: Secondary | ICD-10-CM | POA: Diagnosis not present

## 2021-07-19 DIAGNOSIS — M25512 Pain in left shoulder: Secondary | ICD-10-CM | POA: Diagnosis not present

## 2021-07-21 DIAGNOSIS — H401132 Primary open-angle glaucoma, bilateral, moderate stage: Secondary | ICD-10-CM | POA: Diagnosis not present

## 2021-08-12 DIAGNOSIS — S43035D Inferior dislocation of left humerus, subsequent encounter: Secondary | ICD-10-CM | POA: Diagnosis not present

## 2021-09-08 DIAGNOSIS — S43035D Inferior dislocation of left humerus, subsequent encounter: Secondary | ICD-10-CM | POA: Diagnosis not present

## 2021-09-12 ENCOUNTER — Other Ambulatory Visit: Payer: Self-pay

## 2021-09-12 ENCOUNTER — Encounter: Payer: Self-pay | Admitting: Cardiovascular Disease

## 2021-09-12 ENCOUNTER — Ambulatory Visit (INDEPENDENT_AMBULATORY_CARE_PROVIDER_SITE_OTHER): Payer: Medicare PPO | Admitting: Cardiovascular Disease

## 2021-09-12 VITALS — BP 138/64 | HR 76 | Ht 64.0 in | Wt 108.6 lb

## 2021-09-12 DIAGNOSIS — I10 Essential (primary) hypertension: Secondary | ICD-10-CM

## 2021-09-12 DIAGNOSIS — E7800 Pure hypercholesterolemia, unspecified: Secondary | ICD-10-CM | POA: Diagnosis not present

## 2021-09-12 DIAGNOSIS — I480 Paroxysmal atrial fibrillation: Secondary | ICD-10-CM

## 2021-09-12 DIAGNOSIS — R942 Abnormal results of pulmonary function studies: Secondary | ICD-10-CM

## 2021-09-12 DIAGNOSIS — R636 Underweight: Secondary | ICD-10-CM

## 2021-09-12 DIAGNOSIS — I495 Sick sinus syndrome: Secondary | ICD-10-CM

## 2021-09-12 DIAGNOSIS — Z950 Presence of cardiac pacemaker: Secondary | ICD-10-CM

## 2021-09-12 NOTE — Patient Instructions (Signed)

## 2021-09-12 NOTE — Progress Notes (Signed)
Patient ID: Anne Anthony, female   DOB: September 11, 1936, 85 y.o.   MRN: [id number]    [facility] Office Note    Date:  09/12/2021   ID:  Anne Anthony, DOB 10-28-1936, MRN [id number]  PCP:  Sharilyn Sites, MD  Cardiologist:   Sanda Klein, MD   Chief Complaint  Patient presents with   Pacemaker Check     History of Present Illness:  Anne Anthony is a 85 y.o. female with long-standing paroxysmal atrial fibrillation and symptomatic sinus bradycardia, dual chamber Medtronic pacemaker implanted in 2012, hypertension and hyperlipidemia returns in follow-up.  Anne Anthony is generally doing well.  Thankfully her weight loss has subsided and she is actually gained back a little weight.  She remains quite lean with a BMI of 18.6.  She also remains quite unsteady on her feet.  She had a fall that led to a left shoulder dislocation.  She has what appears to be a right scleral hemorrhage, but does not have any pain in the eye or any visual disturbance.  She has no cardiovascular complaints.  The patient specifically denies any chest pain at rest exertion, dyspnea at rest or with exertion, orthopnea, paroxysmal nocturnal dyspnea, syncope, palpitations, focal neurological deficits, intermittent claudication, lower extremity edema, unexplained weight gain, cough, hemoptysis or wheezing.  In 2019 she had a serious fall complicated by subarachnoid hemorrhage and she has been off anticoagulants since.    Her pacemaker is functioning normally and again does not show any evidence of true atrial fibrillation.  The rhythm is 90% atrial paced, ventricular sensed.  She almost never requires ventricular pacing (0.5%).  There has been no true atrial fibrillation, but she has occasional brief episodes of paroxysmal atrial tachycardia lasting for up to 5 seconds.    Pacemaker function is otherwise normal.  She has a Medtronic Adapta dual-chamber device implanted in 2012 has an anticipated 3.5 years of  generator longevity.  Underlying rhythm is severe sinus bradycardia with 1: 1 AV conduction at about 40 bpm.  She also has evidence of intraventricular conduction delay with a left bundle branch block (relatively narrow at 130 ms).  Amiodarone was discontinued several years ago due to worries about restrictive changes on pulmonary function tests.  She has a history of remote successful ablation for atrial flutter, but then in 2015 had atrial fibrillation rapid ventricular response when she underwent surgical repair of a paraesophageal hernia.  Her pacemaker has not shown any lengthy episodes of atrial fibrillation in years.  She has little in the way of structural heart disease to predispose for atrial fibrillation recurrence.  Chest CT performed  July 2017 shows a large hiatal hernia with herniation of the splenic flexure of the colon and several small bowel loops into the chest. She does have bilateral compressive atelectasis in both lung bases. Pulmonary function tests 2017 show significant restrictive ventilatory defect with FVC that is 50% of predicted and a commensurate reduction in FEV1 and diffusion capacity . Her chest x-ray last performed in December did not show focal infiltrates but did confirm the presence of a large hiatal hernia with intrathoracic stomach gas.   Past Medical History:  Diagnosis Date   Atrial flutter ([place])    successful radiofrequency ablation 2007   Diverticula of colon    Family history of colon cancer    brother   Hiatal hernia    Hyperlipidemia    Hypertension    Pacemaker    Paroxysmal atrial fibrillation ([place])  Severe protein-calorie malnutrition ([place]) 11/17/2014   Tachycardia-bradycardia syndrome ([place])    permament pacermaker, dual chamber Medtronic on 06/2011   Tubular adenoma     Past Surgical History:  Procedure Laterality Date   ABDOMINAL SURGERY     COLONOSCOPY  08/17/2010   Dr. Gala Romney- internal hemorrhoids, o/w normal rectum, L sided diverticula,  tubular adenoma   ESOPHAGOGASTRODUODENOSCOPY  10/502011   Dr. Gala Romney- huge diaphragmatic/hiatial hernia, fundal gland polyps not manipulated o/w noral appearing gastric mucosa, patent pylorus, normal D1 & D2   HIATAL HERNIA REPAIR  2005   HIP ARTHROPLASTY Left 11/16/2014   Procedure: ARTHROPLASTY BIPOLAR HIP;  Surgeon: Mauri Pole, MD;  Location: [facility];  Service: Orthopedics;  Laterality: Left;   PARTIAL HYSTERECTOMY     permament pacemaker  06/2011   dual chamber , Medtronic Adapta serial # [id number]  last checked 02/05/2013   RADIOFREQUENCY ABLATION  06/28/2006    Outpatient Medications Prior to Visit  Medication Sig Dispense Refill   acetaminophen (TYLENOL) 325 MG tablet Take 1-2 tablets (325-650 mg total) by mouth every 4 (four) hours as needed for fever, headache or mild pain.     aspirin EC 81 MG tablet Take 81 mg by mouth at bedtime.     dexlansoprazole (DEXILANT) 60 MG capsule Take 1 capsule (60 mg total) by mouth daily. 20 capsule 0   escitalopram (LEXAPRO) 10 MG tablet Take 5 mg by mouth daily.  3   gabapentin (NEURONTIN) 300 MG capsule 1 cap at bedtime (Patient taking differently: Take 300 mg by mouth 2 (two) times daily. 1 cap at bedtime) 90 capsule 0   gabapentin (NEURONTIN) 600 MG tablet Take 600 mg by mouth 4 (four) times daily.     metoprolol tartrate (LOPRESSOR) 25 MG tablet Take 25 mg by mouth 2 (two) times daily.     simvastatin (ZOCOR) 20 MG tablet Take 20 mg by mouth every evening.     trimethoprim (TRIMPEX) 100 MG tablet TAKE 1 TABLET BY MOUTH AT BEDTIME 90 tablet 3   meclizine (ANTIVERT) 25 MG tablet Take 25 mg by mouth every 6 (six) hours as needed for dizziness. (Patient not taking: Reported on 09/12/2021)     Vitamin D, Ergocalciferol, (DRISDOL) 1.25 MG (50000 UT) CAPS capsule TAKE 1 CAPSULE BY MOUTH EACH WEEKLY- AFTER FINISHING CHANGE TO OTC 2000 IU DAILY (Patient not taking: Reported on 09/12/2021)  0   No facility-administered medications prior to visit.      Allergies:   Morphine and related   Social History   Socioeconomic History   Marital status: Widowed    Spouse name: Not on file   Number of children: 4   Years of education: Not on file   Highest education level: Not on file  Occupational History   Not on file  Tobacco Use   Smoking status: Never   Smokeless tobacco: Never  Vaping Use   Vaping Use: Never used  Substance and Sexual Activity   Alcohol use: No   Drug use: No   Sexual activity: Not on file  Other Topics Concern   Not on file  Social History Narrative   Not on file   Social Determinants of Health   Financial Resource Strain: Not on file  Food Insecurity: Not on file  Transportation Needs: Not on file  Physical Activity: Not on file  Stress: Not on file  Social Connections: Not on file     Family History:  The patient's family history includes Diabetes in  her mother.   ROS:   Please see the history of present illness.    ROS All other systems are reviewed and are negative   PHYSICAL EXAM:   VS:  BP 138/64 (BP Location: Left Arm, Patient Position: Sitting, Cuff Size: Normal)   Pulse 76   Ht 5\' 4"  (1.626 m)   Wt 108 lb 9.6 oz (49.3 kg)   SpO2 95%   BMI 18.64 kg/m      General: Alert, oriented x3, no distress, appears elderly and frail.  Healthy pacemaker site Head: no evidence of trauma, but has right scleral hemorrhage.  PERRL, EOMI, no exophtalmos or lid lag, no myxedema, no xanthelasma; normal ears, nose and oropharynx Neck: normal jugular venous pulsations and no hepatojugular reflux; brisk carotid pulses without delay and no carotid bruits Chest: clear to auscultation, no signs of consolidation by percussion or palpation, normal fremitus, symmetrical and full respiratory excursions Cardiovascular: normal position and quality of the apical impulse, regular rhythm, normal first and paradoxically split second heart sounds, no murmurs, rubs or gallops Abdomen: no tenderness or distention, no  masses by palpation, no abnormal pulsatility or arterial bruits, normal bowel sounds, no hepatosplenomegaly Extremities: no clubbing, cyanosis or edema; 2+ radial, ulnar and brachial pulses bilaterally; 2+ right femoral, posterior tibial and dorsalis pedis pulses; 2+ left femoral, posterior tibial and dorsalis pedis pulses; no subclavian or femoral bruits Neurological: grossly nonfocal Psych: Normal mood and affect   Wt Readings from Last 3 Encounters:  09/12/21 108 lb 9.6 oz (49.3 kg)  07/05/21 105 lb (47.6 kg)  09/27/20 104 lb 12.8 oz (47.5 kg)      Studies/Labs Reviewed:   EKG:  EKG is ordered today.  Shows atrial paced, ventricular sensed rhythm with a left bundle branch block pattern (QRS 130 ms).  QTc 456 ms. BMET    Component Value Date/Time   NA 136 07/04/2021 1928   K 5.1 07/04/2021 1928   CL 102 07/04/2021 1928   CO2 23 07/04/2021 1928   GLUCOSE 115 (H) 07/04/2021 1928   BUN 14 07/04/2021 1928   CREATININE 1.17 (H) 07/04/2021 1928   CALCIUM 8.7 (L) 07/04/2021 1928   GFRNONAA 46 (L) 07/04/2021 1928   GFRAA >60 01/31/2018 0359   Lipid Panel  No results found for: CHOL, TRIG, HDL, CHOLHDL, VLDL, LDLCALC, LDLDIRECT, LABVLDL  07/30/2019  total cholesterol 136, HDL 57, LDL 62, triglycerides 92. Hemoglobin A1c 6.4% Hemoglobin 13.4 Creatinine 1.09 Potassium 4.6 TSH 1.190  03/09/2020 Total cholesterol 142, HDL 55, LDL 69, triglycerides 95 Hemoglobin A1c 6.6% Hemoglobin 13.4, creatinine 1.1, potassium 4.7, normal liver function tests, TSH 1.18  03/10/2021 Cholesterol 143, HDL 54, LDL 71, triglycerides 100 Hemoglobin A1c 6.1%, ALT 7, TSH 1.39  07/04/2021 Hemoglobin 13.7, creatinine 1.17, potassium 5.1  ASSESSMENT:    1. Paroxysmal atrial fibrillation (HCC)   2. Essential hypertension   3. SSS (sick sinus syndrome) ([place])   4. Pacemaker   5. Hypercholesterolemia   6. Restrictive ventilatory defect   7. Underweight due to inadequate caloric intake      PLAN:   In order of problems listed above:  AFib: No atrial fibrillation seen since 2015.  Very high risk of bleeding with history of subarachnoid hemorrhage and continued falls.  Not on anticoagulants.  CHADSVasc 4  (age 32, gender, HTN). HTN: Adequate control, especially from an elderly patient with falls.  Only on metoprolol which also serves to suppress atrial arrhythmia. SSS: Virtually 100% atrial paced, heart rate histogram distribution appears  appropriate for her level of activity. PM: In office checks every 6 months. HLP: LDL in target range on relatively low-dose statin. Restrictive ventilatory defect: Does not report shortness of breath at this time.  This could well be explained by her huge diaphragmatic hernia, rather than side effects of amiodarone.  I would not entirely exclude the use of amiodarone in the future, but currently this is unnecessary. Weight loss: Thankfully she has gained back a little weight, but she remains borderline malnourished.   Medication Adjustments/Labs and Tests Ordered: Current medicines are reviewed at length with the patient today.  Concerns regarding medicines are outlined above.  Medication changes, Labs and Tests ordered today are listed in the Patient Instructions below. Patient Instructions  Medication Instructions:  No changes *If you need a refill on your cardiac medications before your next appointment, please call your pharmacy*   Lab Work: None ordered If you have labs (blood work) drawn today and your tests are completely normal, you will receive your results only by: [place] (if you have MyChart) OR A paper copy in the mail If you have any lab test that is abnormal or we need to change your treatment, we will call you to review the results.   Testing/Procedures: None ordered   Follow-Up: At [facility], you and your health needs are our priority.  As part of our continuing mission to provide you with exceptional heart care, we  have created designated Provider Care Teams.  These Care Teams include your primary Cardiologist (physician) and Advanced Practice Providers (APPs -  Physician Assistants and Nurse Practitioners) who all work together to provide you with the care you need, when you need it.  We recommend signing up for the patient portal called "MyChart".  Sign up information is provided on this After Visit Summary.  MyChart is used to connect with patients for Virtual Visits (Telemedicine).  Patients are able to view lab/test results, encounter notes, upcoming appointments, etc.  Non-urgent messages can be sent to your provider as well.   To learn more about what you can do with MyChart, go to [email or web address].    Your next appointment:   12 months  The format for your next appointment:   In Person  Provider:   Sanda Klein, MD       Signed, Sanda Klein, MD  09/12/2021 6:38 PM    [facility] [address], [place], [place]  [address] Phone: [phone]; Fax: [phone]

## 2021-09-14 DIAGNOSIS — H04123 Dry eye syndrome of bilateral lacrimal glands: Secondary | ICD-10-CM | POA: Diagnosis not present

## 2021-09-14 DIAGNOSIS — H1131 Conjunctival hemorrhage, right eye: Secondary | ICD-10-CM | POA: Diagnosis not present

## 2021-10-04 DIAGNOSIS — G609 Hereditary and idiopathic neuropathy, unspecified: Secondary | ICD-10-CM | POA: Diagnosis not present

## 2021-10-04 DIAGNOSIS — M79676 Pain in unspecified toe(s): Secondary | ICD-10-CM | POA: Diagnosis not present

## 2021-10-04 DIAGNOSIS — L84 Corns and callosities: Secondary | ICD-10-CM | POA: Diagnosis not present

## 2021-10-04 DIAGNOSIS — B351 Tinea unguium: Secondary | ICD-10-CM | POA: Diagnosis not present

## 2021-12-01 DIAGNOSIS — M79605 Pain in left leg: Secondary | ICD-10-CM | POA: Diagnosis not present

## 2021-12-20 DIAGNOSIS — E1140 Type 2 diabetes mellitus with diabetic neuropathy, unspecified: Secondary | ICD-10-CM | POA: Diagnosis not present

## 2021-12-20 DIAGNOSIS — R498 Other voice and resonance disorders: Secondary | ICD-10-CM | POA: Diagnosis not present

## 2021-12-20 DIAGNOSIS — D692 Other nonthrombocytopenic purpura: Secondary | ICD-10-CM | POA: Diagnosis not present

## 2021-12-20 DIAGNOSIS — E782 Mixed hyperlipidemia: Secondary | ICD-10-CM | POA: Diagnosis not present

## 2021-12-20 DIAGNOSIS — T462X1A Poisoning by other antidysrhythmic drugs, accidental (unintentional), initial encounter: Secondary | ICD-10-CM | POA: Diagnosis not present

## 2021-12-20 DIAGNOSIS — I609 Nontraumatic subarachnoid hemorrhage, unspecified: Secondary | ICD-10-CM | POA: Diagnosis not present

## 2021-12-20 DIAGNOSIS — I495 Sick sinus syndrome: Secondary | ICD-10-CM | POA: Diagnosis not present

## 2021-12-20 DIAGNOSIS — I480 Paroxysmal atrial fibrillation: Secondary | ICD-10-CM | POA: Diagnosis not present

## 2021-12-20 DIAGNOSIS — E7849 Other hyperlipidemia: Secondary | ICD-10-CM | POA: Diagnosis not present

## 2021-12-20 DIAGNOSIS — M5136 Other intervertebral disc degeneration, lumbar region: Secondary | ICD-10-CM | POA: Diagnosis not present

## 2022-01-03 DIAGNOSIS — F419 Anxiety disorder, unspecified: Secondary | ICD-10-CM | POA: Diagnosis not present

## 2022-01-03 DIAGNOSIS — K219 Gastro-esophageal reflux disease without esophagitis: Secondary | ICD-10-CM | POA: Diagnosis not present

## 2022-01-03 DIAGNOSIS — M1990 Unspecified osteoarthritis, unspecified site: Secondary | ICD-10-CM | POA: Diagnosis not present

## 2022-01-03 DIAGNOSIS — G8929 Other chronic pain: Secondary | ICD-10-CM | POA: Diagnosis not present

## 2022-01-03 DIAGNOSIS — N390 Urinary tract infection, site not specified: Secondary | ICD-10-CM | POA: Diagnosis not present

## 2022-01-03 DIAGNOSIS — I10 Essential (primary) hypertension: Secondary | ICD-10-CM | POA: Diagnosis not present

## 2022-01-03 DIAGNOSIS — I951 Orthostatic hypotension: Secondary | ICD-10-CM | POA: Diagnosis not present

## 2022-01-03 DIAGNOSIS — G629 Polyneuropathy, unspecified: Secondary | ICD-10-CM | POA: Diagnosis not present

## 2022-01-03 DIAGNOSIS — E785 Hyperlipidemia, unspecified: Secondary | ICD-10-CM | POA: Diagnosis not present

## 2022-01-18 DIAGNOSIS — Z1283 Encounter for screening for malignant neoplasm of skin: Secondary | ICD-10-CM | POA: Diagnosis not present

## 2022-01-18 DIAGNOSIS — L570 Actinic keratosis: Secondary | ICD-10-CM | POA: Diagnosis not present

## 2022-01-18 DIAGNOSIS — Z85828 Personal history of other malignant neoplasm of skin: Secondary | ICD-10-CM | POA: Diagnosis not present

## 2022-01-23 DIAGNOSIS — H401132 Primary open-angle glaucoma, bilateral, moderate stage: Secondary | ICD-10-CM | POA: Diagnosis not present

## 2022-03-22 ENCOUNTER — Other Ambulatory Visit: Payer: Self-pay | Admitting: Urology

## 2022-05-01 DIAGNOSIS — Z681 Body mass index (BMI) 19 or less, adult: Secondary | ICD-10-CM | POA: Diagnosis not present

## 2022-05-01 DIAGNOSIS — Z1331 Encounter for screening for depression: Secondary | ICD-10-CM | POA: Diagnosis not present

## 2022-05-01 DIAGNOSIS — Z0001 Encounter for general adult medical examination with abnormal findings: Secondary | ICD-10-CM | POA: Diagnosis not present

## 2022-05-01 DIAGNOSIS — E1140 Type 2 diabetes mellitus with diabetic neuropathy, unspecified: Secondary | ICD-10-CM | POA: Diagnosis not present

## 2022-05-01 DIAGNOSIS — I480 Paroxysmal atrial fibrillation: Secondary | ICD-10-CM | POA: Diagnosis not present

## 2022-05-01 DIAGNOSIS — E782 Mixed hyperlipidemia: Secondary | ICD-10-CM | POA: Diagnosis not present

## 2022-05-01 DIAGNOSIS — D692 Other nonthrombocytopenic purpura: Secondary | ICD-10-CM | POA: Diagnosis not present

## 2022-05-01 DIAGNOSIS — N183 Chronic kidney disease, stage 3 unspecified: Secondary | ICD-10-CM | POA: Diagnosis not present

## 2022-05-01 DIAGNOSIS — M5136 Other intervertebral disc degeneration, lumbar region: Secondary | ICD-10-CM | POA: Diagnosis not present

## 2022-05-23 DIAGNOSIS — D485 Neoplasm of uncertain behavior of skin: Secondary | ICD-10-CM | POA: Diagnosis not present

## 2022-05-23 DIAGNOSIS — C44529 Squamous cell carcinoma of skin of other part of trunk: Secondary | ICD-10-CM | POA: Diagnosis not present

## 2022-06-01 DIAGNOSIS — C44529 Squamous cell carcinoma of skin of other part of trunk: Secondary | ICD-10-CM | POA: Diagnosis not present

## 2022-06-19 ENCOUNTER — Ambulatory Visit (INDEPENDENT_AMBULATORY_CARE_PROVIDER_SITE_OTHER): Payer: Medicare PPO | Admitting: Cardiovascular Disease

## 2022-06-19 ENCOUNTER — Encounter: Payer: Self-pay | Admitting: Cardiovascular Disease

## 2022-06-19 VITALS — BP 112/72 | HR 84 | Ht 60.0 in | Wt 107.0 lb

## 2022-06-19 DIAGNOSIS — R942 Abnormal results of pulmonary function studies: Secondary | ICD-10-CM

## 2022-06-19 DIAGNOSIS — I480 Paroxysmal atrial fibrillation: Secondary | ICD-10-CM | POA: Diagnosis not present

## 2022-06-19 DIAGNOSIS — Z950 Presence of cardiac pacemaker: Secondary | ICD-10-CM

## 2022-06-19 DIAGNOSIS — I10 Essential (primary) hypertension: Secondary | ICD-10-CM | POA: Diagnosis not present

## 2022-06-19 DIAGNOSIS — E7800 Pure hypercholesterolemia, unspecified: Secondary | ICD-10-CM

## 2022-06-19 DIAGNOSIS — R296 Repeated falls: Secondary | ICD-10-CM

## 2022-06-19 DIAGNOSIS — K449 Diaphragmatic hernia without obstruction or gangrene: Secondary | ICD-10-CM

## 2022-06-19 DIAGNOSIS — I495 Sick sinus syndrome: Secondary | ICD-10-CM | POA: Diagnosis not present

## 2022-06-19 DIAGNOSIS — R636 Underweight: Secondary | ICD-10-CM | POA: Diagnosis not present

## 2022-06-19 NOTE — Patient Instructions (Signed)
Medication Instructions:  No changes *If you need a refill on your cardiac medications before your next appointment, please call your pharmacy*   Lab Work: None ordered If you have labs (blood work) drawn today and your tests are completely normal, you will receive your results only by: [place] (if you have MyChart) OR A paper copy in the mail If you have any lab test that is abnormal or we need to change your treatment, we will call you to review the results.   Testing/Procedures: None ordered   Follow-Up: At [facility], you and your health needs are our priority.  As part of our continuing mission to provide you with exceptional heart care, we have created designated Provider Care Teams.  These Care Teams include your primary Cardiologist (physician) and Advanced Practice Providers (APPs -  Physician Assistants and Nurse Practitioners) who all work together to provide you with the care you need, when you need it.  We recommend signing up for the patient portal called "MyChart".  Sign up information is provided on this After Visit Summary.  MyChart is used to connect with patients for Virtual Visits (Telemedicine).  Patients are able to view lab/test results, encounter notes, upcoming appointments, etc.  Non-urgent messages can be sent to your provider as well.   To learn more about what you can do with MyChart, go to [email or web address].    Your next appointment:   Follow up in 3 months with APP Follow up in 12 months with Dr. Sallyanne Kuster

## 2022-06-19 NOTE — Progress Notes (Signed)
Patient ID: Anne Anthony, female   DOB: 08/14/1936, 86 y.o.   MRN: [id number]    [facility] Office Note    Date:  06/19/2022   ID:  Anne Anthony, DOB 1936/07/14, MRN [id number]  PCP:  Sharilyn Sites, MD  Cardiologist:   Sanda Klein, MD   Chief Complaint  Patient presents with   Pacemaker Problem     History of Present Illness:  Anne Anthony is a 86 y.o. female with long-standing paroxysmal atrial fibrillation and symptomatic sinus bradycardia, dual chamber Medtronic pacemaker implanted in 2012, hypertension and hyperlipidemia returns in follow-up.  Anne Anthony has not had any serious health challenges since her last appointment.  She has managed to maintain her weight, at the lower limit of desirable, but has not gained any weight back.  She has not had any palpitations or syncope, but she continues to be prone to falls.  She has had a few falls since her last appointment, thankfully none of them with serious consequences.  At least one of the falls led to head impact.  She has not had dyspnea or angina at rest or with activity.  She continues to live independently and take care of her household.  She has not had focal neurological events to suggest TIA or stroke.  She denies claudication.  Pacemaker interrogation shows a prolonged episode of paroxysmal atrial fibrillation that started on July 10 and lasted for 81 hours.  The average ventricular rate was normal, although intermittently she had mild RVR.  This is the first time she has had atrial fibrillation documented by her device since 2015.  She is not on anticoagulation due to frequent falls and history of subarachnoid hemorrhage in 2019, shoulder dislocation in 2022.  Otherwise pacemaker function is normal.  Estimated generator longevity is 36 months.  All lead parameters are good.  She has 97% atrial pacing and only 0.5% ventricular pacing.  For her level of activity, the heart rate histogram distribution is normal.  The  device is also recorded occasional brief episodes of nonsustained atrial tachycardia and at least one episode of true nonsustained ventricular tachycardia, only 2 seconds in duration.  Pacemaker function is otherwise normal.  She has a Medtronic Adapta dual-chamber device implanted in 2012 has an anticipated 3.5 years of generator longevity.  Underlying rhythm is severe sinus bradycardia with 1: 1 AV conduction at about 40 bpm.  She also has evidence of intraventricular conduction delay with a left bundle branch block (relatively narrow at 130 ms).  Amiodarone was discontinued several years ago due to worries about restrictive changes on pulmonary function tests.  She has a history of remote successful ablation for atrial flutter, but then in 2015 had atrial fibrillation rapid ventricular response when she underwent surgical repair of a paraesophageal hernia.  Her pacemaker did not show any subsequent episodes of atrial fibrillation until May 22, 2022 when she had an 81-hour long episode.  She has little in the way of structural heart disease to predispose for atrial fibrillation recurrence.  Chest CT performed  July 2017 shows a large hiatal hernia with herniation of the splenic flexure of the colon and several small bowel loops into the chest. She does have bilateral compressive atelectasis in both lung bases. Pulmonary function tests 2017 show significant restrictive ventilatory defect with FVC that is 50% of predicted and a commensurate reduction in FEV1 and diffusion capacity . Her chest x-ray last performed in December did not show focal infiltrates but did confirm  the presence of a large hiatal hernia with intrathoracic stomach gas.   Past Medical History:  Diagnosis Date   Atrial flutter ([place])    successful radiofrequency ablation 2007   Diverticula of colon    Family history of colon cancer    brother   Hiatal hernia    Hyperlipidemia    Hypertension    Pacemaker    Paroxysmal atrial  fibrillation ([place])    Severe protein-calorie malnutrition ([place]) 11/17/2014   Tachycardia-bradycardia syndrome ([place])    permament pacermaker, dual chamber Medtronic on 06/2011   Tubular adenoma     Past Surgical History:  Procedure Laterality Date   ABDOMINAL SURGERY     COLONOSCOPY  08/17/2010   Dr. Gala Romney- internal hemorrhoids, o/w normal rectum, L sided diverticula, tubular adenoma   ESOPHAGOGASTRODUODENOSCOPY  10/502011   Dr. Gala Romney- huge diaphragmatic/hiatial hernia, fundal gland polyps not manipulated o/w noral appearing gastric mucosa, patent pylorus, normal D1 & D2   HIATAL HERNIA REPAIR  2005   HIP ARTHROPLASTY Left 11/16/2014   Procedure: ARTHROPLASTY BIPOLAR HIP;  Surgeon: Mauri Pole, MD;  Location: [facility];  Service: Orthopedics;  Laterality: Left;   PARTIAL HYSTERECTOMY     permament pacemaker  06/2011   dual chamber , Medtronic Adapta serial # [id number]  last checked 02/05/2013   RADIOFREQUENCY ABLATION  06/28/2006    Outpatient Medications Prior to Visit  Medication Sig Dispense Refill   acetaminophen (TYLENOL) 325 MG tablet Take 1-2 tablets (325-650 mg total) by mouth every 4 (four) hours as needed for fever, headache or mild pain.     aspirin EC 81 MG tablet Take 81 mg by mouth at bedtime.     dexlansoprazole (DEXILANT) 60 MG capsule Take 1 capsule (60 mg total) by mouth daily. 20 capsule 0   escitalopram (LEXAPRO) 10 MG tablet Take 5 mg by mouth daily.  3   gabapentin (NEURONTIN) 300 MG capsule 1 cap at bedtime (Patient taking differently: Take 300 mg by mouth 2 (two) times daily. 1 cap at bedtime) 90 capsule 0   LUMIGAN 0.01 % SOLN Place 1 drop into both eyes at bedtime.     metoprolol tartrate (LOPRESSOR) 25 MG tablet Take 25 mg by mouth 2 (two) times daily.     simvastatin (ZOCOR) 20 MG tablet Take 20 mg by mouth every evening.     trimethoprim (TRIMPEX) 100 MG tablet TAKE 1 TABLET BY MOUTH EVERYDAY AT BEDTIME 90 tablet 3   gabapentin (NEURONTIN) 600 MG tablet Take  600 mg by mouth 4 (four) times daily. (Patient not taking: Reported on 06/19/2022)     meclizine (ANTIVERT) 25 MG tablet Take 25 mg by mouth every 6 (six) hours as needed for dizziness. (Patient not taking: Reported on 06/19/2022)     Vitamin D, Ergocalciferol, (DRISDOL) 1.25 MG (50000 UT) CAPS capsule TAKE 1 CAPSULE BY MOUTH EACH WEEKLY- AFTER FINISHING CHANGE TO OTC 2000 IU DAILY (Patient not taking: Reported on 06/19/2022)  0   No facility-administered medications prior to visit.     Allergies:   Morphine and related   Social History   Socioeconomic History   Marital status: Widowed    Spouse name: Not on file   Number of children: 4   Years of education: Not on file   Highest education level: Not on file  Occupational History   Not on file  Tobacco Use   Smoking status: Never   Smokeless tobacco: Never  Vaping Use   Vaping Use: Never used  Substance and Sexual Activity   Alcohol use: No   Drug use: No   Sexual activity: Not on file  Other Topics Concern   Not on file  Social History Narrative   Not on file   Social Determinants of Health   Financial Resource Strain: Not on file  Food Insecurity: Not on file  Transportation Needs: Not on file  Physical Activity: Not on file  Stress: Not on file  Social Connections: Not on file     Family History:  The patient's family history includes Diabetes in her mother.   ROS:   Please see the history of present illness.    ROS All other systems are reviewed and are negative   PHYSICAL EXAM:   VS:  BP 112/72 (BP Location: Left Arm, Patient Position: Sitting, Cuff Size: Small)   Pulse 84   Ht 5' (1.524 m)   Wt 107 lb (48.5 kg)   SpO2 94%   BMI 20.90 kg/m      General: Alert, oriented x3, no distress, healthy left subclavian pacemaker site. Head: no evidence of trauma, PERRL, EOMI, no exophtalmos or lid lag, no myxedema, no xanthelasma; normal ears, nose and oropharynx Neck: normal jugular venous pulsations and no  hepatojugular reflux; brisk carotid pulses without delay and no carotid bruits Chest: clear to auscultation, no signs of consolidation by percussion or palpation, normal fremitus, symmetrical and full respiratory excursions Cardiovascular: normal position and quality of the apical impulse, regular rhythm, normal first and second heart sounds, no murmurs, rubs or gallops Abdomen: no tenderness or distention, no masses by palpation, no abnormal pulsatility or arterial bruits, normal bowel sounds, no hepatosplenomegaly Extremities: no clubbing, cyanosis or edema; 2+ radial, ulnar and brachial pulses bilaterally; 2+ right femoral, posterior tibial and dorsalis pedis pulses; 2+ left femoral, posterior tibial and dorsalis pedis pulses; no subclavian or femoral bruits Neurological: grossly nonfocal Psych: Normal mood and affect    Wt Readings from Last 3 Encounters:  06/19/22 107 lb (48.5 kg)  09/12/21 108 lb 9.6 oz (49.3 kg)  07/05/21 105 lb (47.6 kg)      Studies/Labs Reviewed:   EKG:  EKG is ordered today.  It is very similar to old tracings, showing atrial paced, ventricular sensed rhythm with old left bundle branch block and left axis deviation.  QTc 477 ms BMET    Component Value Date/Time   NA 136 07/04/2021 1928   K 5.1 07/04/2021 1928   CL 102 07/04/2021 1928   CO2 23 07/04/2021 1928   GLUCOSE 115 (H) 07/04/2021 1928   BUN 14 07/04/2021 1928   CREATININE 1.17 (H) 07/04/2021 1928   CALCIUM 8.7 (L) 07/04/2021 1928   GFRNONAA 46 (L) 07/04/2021 1928   GFRAA >60 01/31/2018 0359   Lipid Panel  No results found for: "CHOL", "TRIG", "HDL", "CHOLHDL", "VLDL", "LDLCALC", "LDLDIRECT", "LABVLDL"   12/21/2021 Cholesterol 142, HDL 56, LDL 68, triglycerides 95 Hemoglobin 12.9, creatinine 1.18, potassium 4.7, ALT of 9.0, TSH 1.28  05/01/2022 Hemoglobin A1c 6.3%  ASSESSMENT:    1. Paroxysmal atrial fibrillation (H[facility])   2. Essential hypertension   3. SSS (sick sinus syndrome) ([place])    4. Pacemaker   5. Hypercholesterolemia   6. Restrictive ventilatory defect   7. Hiatal hernia   8. Underweight due to inadequate caloric intake   9. Falls frequently      PLAN:  In order of problems listed above:  AFib: In July she had her first episode of paroxysmal atrial fibrillation seen  since 2015.  Very high risk of bleeding with history of subarachnoid hemorrhage and continued falls.  She continues to have falls.  She has never experienced embolic TIA or stroke.  Will maintain on aspirin for the time being, but if the frequency and burden of atrial fibrillation increases may have to reconsider anticoagulation and/or referral for Watchman device.  A Watchman procedure is not particularly appealing due to her age and the presence of a large hiatal hernia which would impede TEE monitoring during the implantation procedure.  CHADSVasc 4-5  (age 47, gender, HTN, +/- preDM). HTN: Well-controlled. SSS: Virtually 100% atrial paced with appropriate heart rate histogram distribution on current sensor settings. PM: In office checks every 3-6 months.  Does not perform remote downloads. HLP: Excellent lipid parameters on current dose statin.  Continue. Restrictive ventilatory defect: She has a very large diaphragmatic hernia but probably explains this (amiodarone was stopped based on abnormal PFTs, but was probably not the culprit).  I would not entirely exclude the use of amiodarone in the future, but currently this is unnecessary. Weight loss: Remains borderline underweight, but weight has been holding steady for the last 6 months. Frequent falls: Seems to be due to general weakness, not arrhythmia or clear neurological abnormality.   Medication Adjustments/Labs and Tests Ordered: Current medicines are reviewed at length with the patient today.  Concerns regarding medicines are outlined above.  Medication changes, Labs and Tests ordered today are listed in the Patient Instructions below. Patient  Instructions  Medication Instructions:  No changes *If you need a refill on your cardiac medications before your next appointment, please call your pharmacy*   Lab Work: None ordered If you have labs (blood work) drawn today and your tests are completely normal, you will receive your results only by: [facility] (if you have MyChart) OR A paper copy in the mail If you have any lab test that is abnormal or we need to change your treatment, we will call you to review the results.   Testing/Procedures: None ordered   Follow-Up: At [facility], you and your health needs are our priority.  As part of our continuing mission to provide you with exceptional heart care, we have created designated Provider Care Teams.  These Care Teams include your primary Cardiologist (physician) and Advanced Practice Providers (APPs -  Physician Assistants and Nurse Practitioners) who all work together to provide you with the care you need, when you need it.  We recommend signing up for the patient portal called "MyChart".  Sign up information is provided on this After Visit Summary.  MyChart is used to connect with patients for Virtual Visits (Telemedicine).  Patients are able to view lab/test results, encounter notes, upcoming appointments, etc.  Non-urgent messages can be sent to your provider as well.   To learn more about what you can do with MyChart, go to [email or web address].    Your next appointment:   Follow up in 3 months with APP Follow up in 12 months with Dr. Sallyanne Kuster      Signed, Sanda Klein, MD  06/19/2022 2:42 PM    [address], [place], [place]  [address] Phone: [phone]; Fax: [phone]

## 2022-06-21 NOTE — Addendum Note (Signed)
Addended by: Orma Render on: 06/21/2022 02:48 PM   Modules accepted: Orders

## 2022-08-28 DIAGNOSIS — C44529 Squamous cell carcinoma of skin of other part of trunk: Secondary | ICD-10-CM | POA: Diagnosis not present

## 2022-09-21 ENCOUNTER — Encounter: Payer: Self-pay | Admitting: Cardiovascular Disease

## 2022-09-21 ENCOUNTER — Ambulatory Visit: Payer: Medicare PPO | Admitting: Nurse Practitioner

## 2022-09-21 ENCOUNTER — Ambulatory Visit: Payer: Medicare PPO | Attending: Nurse Practitioner | Admitting: Cardiovascular Disease

## 2022-09-21 VITALS — BP 133/64 | HR 76 | Ht 64.0 in | Wt 106.2 lb

## 2022-09-21 DIAGNOSIS — I480 Paroxysmal atrial fibrillation: Secondary | ICD-10-CM | POA: Diagnosis not present

## 2022-09-21 DIAGNOSIS — R636 Underweight: Secondary | ICD-10-CM | POA: Diagnosis not present

## 2022-09-21 DIAGNOSIS — R942 Abnormal results of pulmonary function studies: Secondary | ICD-10-CM | POA: Diagnosis not present

## 2022-09-21 DIAGNOSIS — R296 Repeated falls: Secondary | ICD-10-CM

## 2022-09-21 DIAGNOSIS — Z950 Presence of cardiac pacemaker: Secondary | ICD-10-CM

## 2022-09-21 DIAGNOSIS — I10 Essential (primary) hypertension: Secondary | ICD-10-CM | POA: Diagnosis not present

## 2022-09-21 DIAGNOSIS — E7800 Pure hypercholesterolemia, unspecified: Secondary | ICD-10-CM

## 2022-09-21 DIAGNOSIS — I495 Sick sinus syndrome: Secondary | ICD-10-CM | POA: Diagnosis not present

## 2022-09-21 NOTE — Patient Instructions (Signed)

## 2022-09-24 ENCOUNTER — Encounter: Payer: Self-pay | Admitting: Cardiovascular Disease

## 2022-09-24 NOTE — Progress Notes (Signed)
Patient ID: Anne Anthony, female   DOB: 1936/01/13, 86 y.o.   MRN: [id number]    [facility] Office Note    Date:  09/24/2022   ID:  Anne Anthony, DOB [birth year], MRN [id number]  PCP:  Sharilyn Sites, MD  Cardiologist:   Sanda Klein, MD   Chief Complaint  Patient presents with   Atrial Fibrillation     History of Present Illness:  Anne Anthony is a 86 y.o. female with long-standing paroxysmal atrial fibrillation and symptomatic sinus bradycardia, dual chamber Medtronic pacemaker implanted in 2012, hypertension and hyperlipidemia returns in follow-up.  She has done generally well since her last appointment and thankfully has not had any more falls.  She continues to live alone and drives a vehicle.  She is developed vocal cord dysfunction.  She does not have any cardiovascular complaints.  The patient specifically denies any chest pain at rest exertion, dyspnea at rest or with exertion, orthopnea, paroxysmal nocturnal dyspnea, syncope, palpitations, focal neurological deficits, intermittent claudication, lower extremity edema, unexplained weight gain, cough, hemoptysis or wheezing.  At her previous appointment, pacemaker interrogation showed a prolonged episode of paroxysmal atrial fibrillation that started on July 10 and lasted for 81 hours.  Since then she had another lengthy episode that lasted for about 6 days in September.   She is not on anticoagulation due to frequent falls and history of subarachnoid hemorrhage in 2019, shoulder dislocation in 2022.  The overall burden of atrial fibrillation in the last 3 months is 6.2%.  She is not pacemaker dependent underlying rhythm is sinus bradycardia by 43 bpm.  Normal pacemaker function.  Estimated generator longevity is just under 3 years.  She has 95% atrial pacing and only 0.6% ventricular pacing.  The heart rate histogram distribution appears appropriate.  The parameters are okay She has a Medtronic Adapta  dual-chamber device implanted in 2012.  Amiodarone was discontinued several years ago due to worries about restrictive changes on pulmonary function tests.  She has a history of remote successful ablation for atrial flutter, but then in 2015 had atrial fibrillation rapid ventricular response when she underwent surgical repair of a paraesophageal hernia.  Her pacemaker did not show any subsequent episodes of atrial fibrillation until May 22, 2022 when she had an 81-hour long episode.  She has little in the way of structural heart disease to predispose for atrial fibrillation recurrence.  Chest CT performed  July 2017 shows a large hiatal hernia with herniation of the splenic flexure of the colon and several small bowel loops into the chest. She does have bilateral compressive atelectasis in both lung bases. Pulmonary function tests 2017 show significant restrictive ventilatory defect with FVC that is 50% of predicted and a commensurate reduction in FEV1 and diffusion capacity . Her chest x-ray last performed in December did not show focal infiltrates but did confirm the presence of a large hiatal hernia with intrathoracic stomach gas.   Past Medical History:  Diagnosis Date   Atrial flutter ([place])    successful radiofrequency ablation 2007   Diverticula of colon    Family history of colon cancer    brother   Hiatal hernia    Hyperlipidemia    Hypertension    Pacemaker    Paroxysmal atrial fibrillation ([place])    Severe protein-calorie malnutrition ([place]) 11/17/2014   Tachycardia-bradycardia syndrome ([place])    permament pacermaker, dual chamber Medtronic on 06/2011   Tubular adenoma     Past Surgical History:  Procedure  Laterality Date   ABDOMINAL SURGERY     COLONOSCOPY  08/17/2010   Dr. Gala Romney- internal hemorrhoids, o/w normal rectum, L sided diverticula, tubular adenoma   ESOPHAGOGASTRODUODENOSCOPY  10/502011   Dr. Gala Romney- huge diaphragmatic/hiatial hernia, fundal gland polyps not manipulated  o/w noral appearing gastric mucosa, patent pylorus, normal D1 & D2   HIATAL HERNIA REPAIR  2005   HIP ARTHROPLASTY Left 11/16/2014   Procedure: ARTHROPLASTY BIPOLAR HIP;  Surgeon: Mauri Pole, MD;  Location: [facility];  Service: Orthopedics;  Laterality: Left;   PARTIAL HYSTERECTOMY     permament pacemaker  06/2011   dual chamber , Medtronic Adapta serial # [id number]  last checked 02/05/2013   RADIOFREQUENCY ABLATION  06/28/2006    Outpatient Medications Prior to Visit  Medication Sig Dispense Refill   acetaminophen (TYLENOL) 325 MG tablet Take 1-2 tablets (325-650 mg total) by mouth every 4 (four) hours as needed for fever, headache or mild pain.     aspirin EC 81 MG tablet Take 81 mg by mouth at bedtime.     dexlansoprazole (DEXILANT) 60 MG capsule Take 1 capsule (60 mg total) by mouth daily. 20 capsule 0   escitalopram (LEXAPRO) 10 MG tablet Take 5 mg by mouth daily.  3   gabapentin (NEURONTIN) 300 MG capsule 1 cap at bedtime (Patient taking differently: Take 300 mg by mouth 2 (two) times daily. 1 cap at bedtime) 90 capsule 0   gabapentin (NEURONTIN) 600 MG tablet Take 600 mg by mouth 4 (four) times daily.     LUMIGAN 0.01 % SOLN Place 1 drop into both eyes at bedtime.     meclizine (ANTIVERT) 25 MG tablet Take 25 mg by mouth every 6 (six) hours as needed for dizziness.     metoprolol tartrate (LOPRESSOR) 25 MG tablet Take 25 mg by mouth 2 (two) times daily.     simvastatin (ZOCOR) 20 MG tablet Take 20 mg by mouth every evening.     trimethoprim (TRIMPEX) 100 MG tablet TAKE 1 TABLET BY MOUTH EVERYDAY AT BEDTIME 90 tablet 3   Vitamin D, Ergocalciferol, (DRISDOL) 1.25 MG (50000 UT) CAPS capsule TAKE 1 CAPSULE BY MOUTH EACH WEEKLY- AFTER FINISHING CHANGE TO OTC 2000 IU DAILY (Patient not taking: Reported on 09/21/2022)  0   No facility-administered medications prior to visit.     Allergies:   Morphine and related   Social History   Socioeconomic History   Marital status: Widowed     Spouse name: Not on file   Number of children: 4   Years of education: Not on file   Highest education level: Not on file  Occupational History   Not on file  Tobacco Use   Smoking status: Never   Smokeless tobacco: Never  Vaping Use   Vaping Use: Never used  Substance and Sexual Activity   Alcohol use: No   Drug use: No   Sexual activity: Not on file  Other Topics Concern   Not on file  Social History Narrative   Not on file   Social Determinants of Health   Financial Resource Strain: Not on file  Food Insecurity: Not on file  Transportation Needs: Not on file  Physical Activity: Not on file  Stress: Not on file  Social Connections: Not on file     Family History:  The patient's family history includes Diabetes in her mother.   ROS:   Please see the history of present illness.    ROS All other systems are  reviewed and are negative   PHYSICAL EXAM:   VS:  BP 133/64 (BP Location: Left Arm, Patient Position: Sitting, Cuff Size: Normal)   Pulse 76   Ht '5\' 4"'$  (1.626 m)   Wt 48.2 kg   SpO2 98%   BMI 18.23 kg/m       General: Alert, oriented x3, no distress, healthy pacemaker site.  Appears elderly and frail. Head: no evidence of trauma, PERRL, EOMI, no exophtalmos or lid lag, no myxedema, no xanthelasma; normal ears, nose and oropharynx Neck: normal jugular venous pulsations and no hepatojugular reflux; brisk carotid pulses without delay and no carotid bruits Chest: clear to auscultation, no signs of consolidation by percussion or palpation, normal fremitus, symmetrical and full respiratory excursions Cardiovascular: normal position and quality of the apical impulse, regular rhythm, normal first and second heart sounds, no murmurs, rubs or gallops Abdomen: no tenderness or distention, no masses by palpation, no abnormal pulsatility or arterial bruits, normal bowel sounds, no hepatosplenomegaly Extremities: no clubbing, cyanosis or edema; 2+ radial, ulnar and brachial  pulses bilaterally; 2+ right femoral, posterior tibial and dorsalis pedis pulses; 2+ left femoral, posterior tibial and dorsalis pedis pulses; no subclavian or femoral bruits Neurological: grossly nonfocal Psych: Normal mood and affect     Wt Readings from Last 3 Encounters:  09/21/22 48.2 kg  06/19/22 48.5 kg  09/12/21 49.3 kg      Studies/Labs Reviewed:   EKG:  EKG is ordered today.  Intracardiac electrogram shows atrial paced, ventricular sensed rhythm  BMET    Component Value Date/Time   NA 136 07/04/2021 1928   K 5.1 07/04/2021 1928   CL 102 07/04/2021 1928   CO2 23 07/04/2021 1928   GLUCOSE 115 (H) 07/04/2021 1928   BUN 14 07/04/2021 1928   CREATININE 1.17 (H) 07/04/2021 1928   CALCIUM 8.7 (L) 07/04/2021 1928   GFRNONAA 46 (L) 07/04/2021 1928   GFRAA >60 01/31/2018 0359   Lipid Panel  No results found for: "CHOL", "TRIG", "HDL", "CHOLHDL", "VLDL", "LDLCALC", "LDLDIRECT", "LABVLDL"   12/21/2021 Cholesterol 142, HDL 56, LDL 68, triglycerides 95 Hemoglobin 12.9, creatinine 1.18, potassium 4.7, ALT of 9.0, TSH 1.28  05/01/2022 Hemoglobin A1c 6.3%  ASSESSMENT:    1. Paroxysmal atrial fibrillation (HCC)   2. Essential hypertension   3. SSS (sick sinus syndrome) ([place])   4. Pacemaker   5. Hypercholesterolemia   6. Restrictive ventilatory defect   7. Underweight due to inadequate caloric intake   8. Falls frequently       PLAN:  In order of problems listed above:  AFib: She has had 2 lengthy episodes of atrial fibrillation in the last 6 months.  Very high risk of bleeding with history of subarachnoid hemorrhage and continued falls.  She has not had any falls in the last 3 months.  She has never experienced embolic TIA or stroke.  At this point aspirin still appears to be a safer choice for her, but may have to reconsider if atrial fibrillation remains prevalent, and especially if she develops any focal neurological symptoms.  Amiodarone was suspected of  causing lung abnormalities in the past, but that may have actually been artifactual due to her large diaphragmatic hernia.  A Watchman procedure is not particularly appealing due to her age and the presence of a large hiatal hernia which would impede TEE monitoring during the implantation procedure.  CHADSVasc 4-5  (age 81, gender, HTN, +/- preDM). HTN: Well-controlled SSS: She is not pacemaker dependent.  Virtually 100%  atrial paced with appropriate heart rate histogram distribution on current sensor settings. PM: she does not perform remote downloads we will have to bring her to the office every 3-6 months. HLP: Continue statin.  Good lipid parameters. Restrictive ventilatory defect: She has a very large diaphragmatic hernia but probably explains this (amiodarone was stopped based on abnormal PFTs, but was probably not the culprit).  I would not entirely exclude the use of amiodarone in the future, but currently this is unnecessary. Weight loss: BMI is only 18.  She appears frail. Frequent falls: Seems to be due to general weakness, not arrhythmia or clear neurological abnormality.   Medication Adjustments/Labs and Tests Ordered: Current medicines are reviewed at length with the patient today.  Concerns regarding medicines are outlined above.  Medication changes, Labs and Tests ordered today are listed in the Patient Instructions below. Patient Instructions  Medication Instructions:  The current medical regimen is effective;  continue present plan and medications.  *If you need a refill on your cardiac medications before your next appointment, please call your pharmacy*   Follow-Up: At [facility], you and your health needs are our priority.  As part of our continuing mission to provide you with exceptional heart care, we have created designated Provider Care Teams.  These Care Teams include your primary Cardiologist (physician) and Advanced Practice Providers (APPs -  Physician  Assistants and Nurse Practitioners) who all work together to provide you with the care you need, when you need it.  We recommend signing up for the patient portal called "MyChart".  Sign up information is provided on this After Visit Summary.  MyChart is used to connect with patients for Virtual Visits (Telemedicine).  Patients are able to view lab/test results, encounter notes, upcoming appointments, etc.  Non-urgent messages can be sent to your provider as well.   To learn more about what you can do with MyChart, go to [email or web address].    Your next appointment:   12 month(s)  The format for your next appointment:   In Person  Provider:   Sanda Klein, MD              Signed, Sanda Klein, MD  09/24/2022 7:54 PM    [address], [place], [place]  [address] Phone: [phone]; Fax: [phone]

## 2022-09-26 ENCOUNTER — Telehealth: Payer: Self-pay

## 2022-09-26 NOTE — Telephone Encounter (Signed)
Called patient left message on personal voice mail Dr.Croitoru wants to see you back in 6 months not 1 year.Advised to call in Feb to schedule a May appointment with Dr.Croitoru for a pacemaker follow up.

## 2022-09-28 DIAGNOSIS — C44529 Squamous cell carcinoma of skin of other part of trunk: Secondary | ICD-10-CM | POA: Diagnosis not present

## 2022-09-29 ENCOUNTER — Encounter: Payer: Self-pay | Admitting: Cardiovascular Disease

## 2022-11-28 DIAGNOSIS — H401132 Primary open-angle glaucoma, bilateral, moderate stage: Secondary | ICD-10-CM | POA: Diagnosis not present

## 2022-12-05 DIAGNOSIS — Z0001 Encounter for general adult medical examination with abnormal findings: Secondary | ICD-10-CM | POA: Diagnosis not present

## 2022-12-05 DIAGNOSIS — E1140 Type 2 diabetes mellitus with diabetic neuropathy, unspecified: Secondary | ICD-10-CM | POA: Diagnosis not present

## 2022-12-05 DIAGNOSIS — E119 Type 2 diabetes mellitus without complications: Secondary | ICD-10-CM | POA: Diagnosis not present

## 2022-12-05 DIAGNOSIS — E782 Mixed hyperlipidemia: Secondary | ICD-10-CM | POA: Diagnosis not present

## 2022-12-05 DIAGNOSIS — I480 Paroxysmal atrial fibrillation: Secondary | ICD-10-CM | POA: Diagnosis not present

## 2022-12-05 DIAGNOSIS — D692 Other nonthrombocytopenic purpura: Secondary | ICD-10-CM | POA: Diagnosis not present

## 2022-12-05 DIAGNOSIS — Z681 Body mass index (BMI) 19 or less, adult: Secondary | ICD-10-CM | POA: Diagnosis not present

## 2022-12-05 DIAGNOSIS — N183 Chronic kidney disease, stage 3 unspecified: Secondary | ICD-10-CM | POA: Diagnosis not present

## 2022-12-06 LAB — LAB REPORT - SCANNED
Albumin, Urine POC: 40.5
Creatinine, POC: 160.3 mg/dL
EGFR: 42
Microalb Creat Ratio: 25

## 2022-12-12 DIAGNOSIS — G609 Hereditary and idiopathic neuropathy, unspecified: Secondary | ICD-10-CM | POA: Diagnosis not present

## 2022-12-12 DIAGNOSIS — L84 Corns and callosities: Secondary | ICD-10-CM | POA: Diagnosis not present

## 2022-12-12 DIAGNOSIS — M79676 Pain in unspecified toe(s): Secondary | ICD-10-CM | POA: Diagnosis not present

## 2022-12-12 DIAGNOSIS — B351 Tinea unguium: Secondary | ICD-10-CM | POA: Diagnosis not present

## 2022-12-14 DIAGNOSIS — K219 Gastro-esophageal reflux disease without esophagitis: Secondary | ICD-10-CM | POA: Diagnosis not present

## 2022-12-14 DIAGNOSIS — I10 Essential (primary) hypertension: Secondary | ICD-10-CM | POA: Diagnosis not present

## 2022-12-14 DIAGNOSIS — I739 Peripheral vascular disease, unspecified: Secondary | ICD-10-CM | POA: Diagnosis not present

## 2022-12-14 DIAGNOSIS — N390 Urinary tract infection, site not specified: Secondary | ICD-10-CM | POA: Diagnosis not present

## 2022-12-14 DIAGNOSIS — M5430 Sciatica, unspecified side: Secondary | ICD-10-CM | POA: Diagnosis not present

## 2022-12-14 DIAGNOSIS — M1990 Unspecified osteoarthritis, unspecified site: Secondary | ICD-10-CM | POA: Diagnosis not present

## 2022-12-14 DIAGNOSIS — R32 Unspecified urinary incontinence: Secondary | ICD-10-CM | POA: Diagnosis not present

## 2022-12-14 DIAGNOSIS — I2510 Atherosclerotic heart disease of native coronary artery without angina pectoris: Secondary | ICD-10-CM | POA: Diagnosis not present

## 2022-12-14 DIAGNOSIS — E785 Hyperlipidemia, unspecified: Secondary | ICD-10-CM | POA: Diagnosis not present

## 2023-01-31 DIAGNOSIS — L570 Actinic keratosis: Secondary | ICD-10-CM | POA: Diagnosis not present

## 2023-01-31 DIAGNOSIS — D239 Other benign neoplasm of skin, unspecified: Secondary | ICD-10-CM | POA: Diagnosis not present

## 2023-01-31 DIAGNOSIS — Z1283 Encounter for screening for malignant neoplasm of skin: Secondary | ICD-10-CM | POA: Diagnosis not present

## 2023-01-31 DIAGNOSIS — Z85828 Personal history of other malignant neoplasm of skin: Secondary | ICD-10-CM | POA: Diagnosis not present

## 2023-02-05 ENCOUNTER — Other Ambulatory Visit: Payer: Self-pay | Admitting: Urology

## 2023-04-02 ENCOUNTER — Encounter: Payer: Self-pay | Admitting: Cardiovascular Disease

## 2023-04-02 ENCOUNTER — Ambulatory Visit: Payer: Medicare PPO | Attending: Cardiovascular Disease | Admitting: Cardiovascular Disease

## 2023-04-02 VITALS — BP 126/64 | HR 94 | Ht 64.0 in | Wt 111.0 lb

## 2023-04-02 DIAGNOSIS — R942 Abnormal results of pulmonary function studies: Secondary | ICD-10-CM | POA: Diagnosis not present

## 2023-04-02 DIAGNOSIS — K449 Diaphragmatic hernia without obstruction or gangrene: Secondary | ICD-10-CM | POA: Diagnosis not present

## 2023-04-02 DIAGNOSIS — I495 Sick sinus syndrome: Secondary | ICD-10-CM | POA: Diagnosis not present

## 2023-04-02 DIAGNOSIS — I10 Essential (primary) hypertension: Secondary | ICD-10-CM | POA: Diagnosis not present

## 2023-04-02 DIAGNOSIS — Z950 Presence of cardiac pacemaker: Secondary | ICD-10-CM

## 2023-04-02 DIAGNOSIS — R636 Underweight: Secondary | ICD-10-CM | POA: Diagnosis not present

## 2023-04-02 DIAGNOSIS — E7800 Pure hypercholesterolemia, unspecified: Secondary | ICD-10-CM | POA: Diagnosis not present

## 2023-04-02 DIAGNOSIS — I480 Paroxysmal atrial fibrillation: Secondary | ICD-10-CM

## 2023-04-02 DIAGNOSIS — R296 Repeated falls: Secondary | ICD-10-CM

## 2023-04-02 NOTE — Progress Notes (Signed)
Patient ID: Anne Anthony, female   DOB: 27-Feb-1936, 87 y.o.   MRN: [id number]    [facility] Office Note    Date:  04/05/2023   ID:  Anne Anthony, DOB [birth year], MRN [id number]  PCP:  [name], MD  Cardiologist:   Thurmon Fair, MD   Chief Complaint  Patient presents with   Pacemaker Check          History of Present Illness:  Anne Anthony is a 87 y.o. female with long-standing paroxysmal atrial fibrillation and symptomatic sinus bradycardia, dual chamber Medtronic pacemaker implanted in 2012, hypertension and hyperlipidemia returns in follow-up.  She has done well since her last appointment.  She is relatively sedentary and has not had any new falls.  She denies shortness of breath or chest pain with her usual activity.  She is not aware of any palpitations and has not had dizziness or syncope.  Does not have orthopnea, PND or lower extremity edema.  Vocal cord dysfunction persists, but has not really worsened.  Pacemaker interrogation shows normal device function.  She has 98% atrial pacing and only 0.5% ventricular pacing.  She has occasional high ventricular rates due to paroxysmal atrial tachycardia with 1: 1 AV conduction, none of them sustained.  Underlying rhythm is sinus bradycardia around 40 bpm.  She has estimated 33 months of longevity on the generator (Medtronic Adapta implanted in 2012).  Lead parameters are stable.  Although she remains underweight, she has gained a few pounds since last year.  Amiodarone was discontinued several years ago due to worries about restrictive changes on pulmonary function tests.  She has a history of remote successful ablation for atrial flutter, but then in 2015 had atrial fibrillation with rapid ventricular response when she underwent surgical repair of a paraesophageal hernia.  Her pacemaker did not show any subsequent episodes of atrial fibrillation until May 22, 2022 when she had an 81-hour long episode.  She has  little in the way of structural heart disease to predispose for atrial fibrillation recurrence.  Chest CT performed  July 2017 shows a large hiatal hernia with herniation of the splenic flexure of the colon and several small bowel loops into the chest. She does have bilateral compressive atelectasis in both lung bases. Pulmonary function tests 2017 show significant restrictive ventilatory defect with FVC that is 50% of predicted and a commensurate reduction in FEV1 and diffusion capacity . Her chest x-ray last performed in December did not show focal infiltrates but did confirm the presence of a large hiatal hernia with intrathoracic stomach gas.   Past Medical History:  Diagnosis Date   Atrial flutter (HCC)    successful radiofrequency ablation 2007   Diverticula of colon    Family history of colon cancer    brother   Hiatal hernia    Hyperlipidemia    Hypertension    Pacemaker    Paroxysmal atrial fibrillation (HCC)    Severe protein-calorie malnutrition (HCC) 11/17/2014   Tachycardia-bradycardia syndrome (HCC)    permament pacermaker, dual chamber Medtronic on 06/2011   Tubular adenoma     Past Surgical History:  Procedure Laterality Date   ABDOMINAL SURGERY     COLONOSCOPY  08/17/2010   Dr. Jena Gauss- internal hemorrhoids, o/w normal rectum, L sided diverticula, tubular adenoma   ESOPHAGOGASTRODUODENOSCOPY  10/502011   Dr. Jena Gauss- huge diaphragmatic/hiatial hernia, fundal gland polyps not manipulated o/w noral appearing gastric mucosa, patent pylorus, normal D1 & D2   HIATAL HERNIA REPAIR  2005   HIP ARTHROPLASTY Left 11/16/2014   Procedure: ARTHROPLASTY BIPOLAR HIP;  Surgeon: Shelda Pal, MD;  Location: [facility];  Service: Orthopedics;  Laterality: Left;   PARTIAL HYSTERECTOMY     permament pacemaker  06/2011   dual chamber , Medtronic Adapta serial # [id number]  last checked 02/05/2013   RADIOFREQUENCY ABLATION  06/28/2006    Outpatient Medications Prior to Visit  Medication Sig  Dispense Refill   acetaminophen (TYLENOL) 325 MG tablet Take 1-2 tablets (325-650 mg total) by mouth every 4 (four) hours as needed for fever, headache or mild pain.     aspirin EC 81 MG tablet Take 81 mg by mouth at bedtime.     dexlansoprazole (DEXILANT) 60 MG capsule Take 1 capsule (60 mg total) by mouth daily. 20 capsule 0   escitalopram (LEXAPRO) 10 MG tablet Take 5 mg by mouth daily.  3   LUMIGAN 0.01 % SOLN Place 1 drop into both eyes at bedtime.     meclizine (ANTIVERT) 25 MG tablet Take 25 mg by mouth every 6 (six) hours as needed for dizziness.     metoprolol tartrate (LOPRESSOR) 25 MG tablet Take 25 mg by mouth 2 (two) times daily.     simvastatin (ZOCOR) 20 MG tablet Take 20 mg by mouth every evening.     trimethoprim (TRIMPEX) 100 MG tablet TAKE 1 TABLET BY MOUTH EVERYDAY AT BEDTIME 90 tablet 3   gabapentin (NEURONTIN) 300 MG capsule 1 cap at bedtime (Patient taking differently: Take 300 mg by mouth 2 (two) times daily. 1 cap at bedtime) 90 capsule 0   gabapentin (NEURONTIN) 600 MG tablet Take 600 mg by mouth 4 (four) times daily.     Vitamin D, Ergocalciferol, (DRISDOL) 1.25 MG (50000 UT) CAPS capsule TAKE 1 CAPSULE BY MOUTH EACH WEEKLY- AFTER FINISHING CHANGE TO OTC 2000 IU DAILY (Patient not taking: Reported on 04/02/2023)  0   No facility-administered medications prior to visit.     Allergies:   Morphine and codeine   Social History   Socioeconomic History   Marital status: Widowed    Spouse name: Not on file   Number of children: 4   Years of education: Not on file   Highest education level: Not on file  Occupational History   Not on file  Tobacco Use   Smoking status: Never   Smokeless tobacco: Never  Vaping Use   Vaping Use: Never used  Substance and Sexual Activity   Alcohol use: No   Drug use: No   Sexual activity: Not on file  Other Topics Concern   Not on file  Social History Narrative   Not on file   Social Determinants of Health   Financial  Resource Strain: Not on file  Food Insecurity: Not on file  Transportation Needs: Not on file  Physical Activity: Not on file  Stress: Not on file  Social Connections: Not on file     Family History:  The patient's family history includes Diabetes in her mother.   ROS:   Please see the history of present illness.    ROS All other systems are reviewed and are negative   PHYSICAL EXAM:   VS:  BP 126/64 (BP Location: Left Arm, Patient Position: Sitting, Cuff Size: Normal)   Pulse 94   Ht 5\' 4"  (1.626 m)   Wt 111 lb (50.3 kg)   SpO2 96%   BMI 19.05 kg/m      General: Alert, oriented x3, no  distress, very thin, appears elderly and frail.  Healthy left subclavian pacemaker site. Head: no evidence of trauma, PERRL, EOMI, no exophtalmos or lid lag, no myxedema, no xanthelasma; normal ears, nose and oropharynx Neck: normal jugular venous pulsations and no hepatojugular reflux; brisk carotid pulses without delay and no carotid bruits Chest: clear to auscultation, no signs of consolidation by percussion or palpation, normal fremitus, symmetrical and full respiratory excursions Cardiovascular: normal position and quality of the apical impulse, regular rhythm, normal first and second heart sounds, no murmurs, rubs or gallops Abdomen: no tenderness or distention, no masses by palpation, no abnormal pulsatility or arterial bruits, normal bowel sounds, no hepatosplenomegaly Extremities: no clubbing, cyanosis or edema; 2+ radial, ulnar and brachial pulses bilaterally; 2+ right femoral, posterior tibial and dorsalis pedis pulses; 2+ left femoral, posterior tibial and dorsalis pedis pulses; no subclavian or femoral bruits Neurological: grossly nonfocal Psych: Normal mood and affect'   Wt Readings from Last 3 Encounters:  04/02/23 111 lb (50.3 kg)  09/21/22 106 lb 3.2 oz (48.2 kg)  06/19/22 107 lb (48.5 kg)      Studies/Labs Reviewed:   EKG:  EKG is ordered today.  Intracardiac electrogram  shows atrial paced, ventricular sensed rhythm  BMET    Component Value Date/Time   NA 136 07/04/2021 1928   K 5.1 07/04/2021 1928   CL 102 07/04/2021 1928   CO2 23 07/04/2021 1928   GLUCOSE 115 (H) 07/04/2021 1928   BUN 14 07/04/2021 1928   CREATININE 1.17 (H) 07/04/2021 1928   CALCIUM 8.7 (L) 07/04/2021 1928   GFRNONAA 46 (L) 07/04/2021 1928   GFRAA >60 01/31/2018 0359   Lipid Panel  No results found for: "CHOL", "TRIG", "HDL", "CHOLHDL", "VLDL", "LDLCALC", "LDLDIRECT", "LABVLDL"   12/21/2021 Cholesterol 142, HDL 56, LDL 68, triglycerides 95 Hemoglobin 12.9, creatinine 1.18, potassium 4.7, ALT of 9.0, TSH 1.28  05/01/2022 Hemoglobin A1c 6.3%  12/06/2022 Cholesterol 149, HDL 59, LDL 69, triglycerides 117 Hemoglobin A1c 6.7% Creatinine 1.27, potassium 4.3, ALT 10, TSH 1.63  ASSESSMENT:    1. Paroxysmal atrial fibrillation (HCC)   2. Essential hypertension   3. SSS (sick sinus syndrome) (HCC)   4. Pacemaker   5. Hypercholesterolemia   6. Restrictive ventilatory defect   7. Hiatal hernia   8. Underweight due to inadequate caloric intake   9. Falls frequently       PLAN:  In order of problems listed above:  AFib: She has had at least 3 documented episodes of paroxysmal atrial fibrillation, sometimes lasting for over 3 days, but none in the last 10 months.  Very high risk of bleeding with history of subarachnoid hemorrhage and frequent falls.  She has never experienced embolic TIA or stroke.  At this point aspirin still appears to be a safer choice for her, but may have to reconsider if atrial fibrillation becomes more prevalent, and especially if she develops any focal neurological symptoms.  Amiodarone was suspected of causing lung abnormalities in the past, but that may have actually been artifactual due to her large diaphragmatic hernia.  A Watchman procedure is not particularly appealing due to her age and the presence of a large hiatal hernia which would impede TEE  monitoring during the implantation procedure.  CHADSVasc 4-5  (age 42, gender, HTN, +/- preDM). HTN: Very well-controlled. SSS: Not pacemaker dependent, virtually 100% atrial paced, ventricular sensed. PM: Does not perform remote downloads.  Bring her back to the office in 6 months. HLP: All lipid parameters in desirable  range.  Continue statin. Restrictive ventilatory defect: Not currently complaining of shortness of breath, but has a very sedentary lifestyle.  She has a very large diaphragmatic hernia but probably explains this (amiodarone was stopped based on abnormal PFTs, but was probably not the culprit).  I would not entirely exclude the use of amiodarone in the future, but currently this is unnecessary. Underweight: Weight loss has not worsened, in fact she's gained back a few pounds since last year.  BMI remains low at 19. Frequent falls: Seems to be due to general weakness, not arrhythmia or clear neurological abnormality.  No recent falls   Medication Adjustments/Labs and Tests Ordered: Current medicines are reviewed at length with the patient today.  Concerns regarding medicines are outlined above.  Medication changes, Labs and Tests ordered today are listed in the Patient Instructions below. Patient Instructions  Medication Instructions:  No changes *If you need a refill on your cardiac medications before your next appointment, please call your pharmacy*  Follow-Up: At [facility], you and your health needs are our priority.  As part of our continuing mission to provide you with exceptional heart care, we have created designated Provider Care Teams.  These Care Teams include your primary Cardiologist (physician) and Advanced Practice Providers (APPs -  Physician Assistants and Nurse Practitioners) who all work together to provide you with the care you need, when you need it.  We recommend signing up for the patient portal called "MyChart".  Sign up information is provided on  this After Visit Summary.  MyChart is used to connect with patients for Virtual Visits (Telemedicine).  Patients are able to view lab/test results, encounter notes, upcoming appointments, etc.  Non-urgent messages can be sent to your provider as well.   To learn more about what you can do with MyChart, go to [email or web address].    Your next appointment:   1 year(s)  Provider:   Thurmon Fair, MD         Signed, Thurmon Fair, MD  04/05/2023 3:12 PM    [facility] [address] [place], [place], [place]  [address] Phone: [phone]; Fax: [phone]

## 2023-04-02 NOTE — Patient Instructions (Signed)
Medication Instructions:  No changes *If you need a refill on your cardiac medications before your next appointment, please call your pharmacy*  Follow-Up: At [facility], you and your health needs are our priority.  As part of our continuing mission to provide you with exceptional heart care, we have created designated Provider Care Teams.  These Care Teams include your primary Cardiologist (physician) and Advanced Practice Providers (APPs -  Physician Assistants and Nurse Practitioners) who all work together to provide you with the care you need, when you need it.  We recommend signing up for the patient portal called "MyChart".  Sign up information is provided on this After Visit Summary.  MyChart is used to connect with patients for Virtual Visits (Telemedicine).  Patients are able to view lab/test results, encounter notes, upcoming appointments, etc.  Non-urgent messages can be sent to your provider as well.   To learn more about what you can do with MyChart, go to [email or web address].    Your next appointment:   1 year(s)  Provider:   Mihai Croitoru, MD     

## 2023-04-05 ENCOUNTER — Encounter: Payer: Self-pay | Admitting: Cardiovascular Disease

## 2023-04-06 ENCOUNTER — Telehealth: Payer: Self-pay | Admitting: Emergency Medicine

## 2023-04-06 NOTE — Telephone Encounter (Signed)
Informed pt's daughter that she needs to be seen in 6 months instead of 1 year. Appt made for 10/04/23 at 1100. Verbalized understanding

## 2023-04-19 DIAGNOSIS — D485 Neoplasm of uncertain behavior of skin: Secondary | ICD-10-CM | POA: Diagnosis not present

## 2023-04-19 DIAGNOSIS — D1722 Benign lipomatous neoplasm of skin and subcutaneous tissue of left arm: Secondary | ICD-10-CM | POA: Diagnosis not present

## 2023-05-03 ENCOUNTER — Emergency Department (HOSPITAL_COMMUNITY): Payer: Medicare PPO

## 2023-05-03 ENCOUNTER — Other Ambulatory Visit: Payer: Self-pay

## 2023-05-03 ENCOUNTER — Inpatient Hospital Stay (HOSPITAL_COMMUNITY)
Admission: EM | Admit: 2023-05-03 | Discharge: 2023-05-06 | DRG: 291 | Disposition: A | Discharge status: Home / Self Care | Payer: Medicare PPO | Attending: Internal Medicine | Admitting: Internal Medicine

## 2023-05-03 DIAGNOSIS — I130 Hypertensive heart and chronic kidney disease with heart failure and stage 1 through stage 4 chronic kidney disease, or unspecified chronic kidney disease: Secondary | ICD-10-CM | POA: Diagnosis present

## 2023-05-03 DIAGNOSIS — Z833 Family history of diabetes mellitus: Secondary | ICD-10-CM

## 2023-05-03 DIAGNOSIS — I4892 Unspecified atrial flutter: Secondary | ICD-10-CM | POA: Diagnosis not present

## 2023-05-03 DIAGNOSIS — N189 Chronic kidney disease, unspecified: Secondary | ICD-10-CM | POA: Diagnosis not present

## 2023-05-03 DIAGNOSIS — G629 Polyneuropathy, unspecified: Secondary | ICD-10-CM | POA: Diagnosis present

## 2023-05-03 DIAGNOSIS — Z1331 Encounter for screening for depression: Secondary | ICD-10-CM | POA: Diagnosis not present

## 2023-05-03 DIAGNOSIS — I480 Paroxysmal atrial fibrillation: Secondary | ICD-10-CM | POA: Diagnosis not present

## 2023-05-03 DIAGNOSIS — Z800 Family history of malignant neoplasm of digestive organs: Secondary | ICD-10-CM | POA: Diagnosis not present

## 2023-05-03 DIAGNOSIS — I5030 Unspecified diastolic (congestive) heart failure: Secondary | ICD-10-CM | POA: Diagnosis not present

## 2023-05-03 DIAGNOSIS — Z885 Allergy status to narcotic agent status: Secondary | ICD-10-CM

## 2023-05-03 DIAGNOSIS — J9811 Atelectasis: Secondary | ICD-10-CM | POA: Diagnosis present

## 2023-05-03 DIAGNOSIS — R002 Palpitations: Secondary | ICD-10-CM | POA: Diagnosis present

## 2023-05-03 DIAGNOSIS — I495 Sick sinus syndrome: Secondary | ICD-10-CM | POA: Diagnosis present

## 2023-05-03 DIAGNOSIS — Z0001 Encounter for general adult medical examination with abnormal findings: Secondary | ICD-10-CM | POA: Diagnosis not present

## 2023-05-03 DIAGNOSIS — I10 Essential (primary) hypertension: Secondary | ICD-10-CM | POA: Diagnosis not present

## 2023-05-03 DIAGNOSIS — R718 Other abnormality of red blood cells: Secondary | ICD-10-CM | POA: Diagnosis present

## 2023-05-03 DIAGNOSIS — D631 Anemia in chronic kidney disease: Secondary | ICD-10-CM | POA: Diagnosis present

## 2023-05-03 DIAGNOSIS — I5033 Acute on chronic diastolic (congestive) heart failure: Secondary | ICD-10-CM | POA: Diagnosis present

## 2023-05-03 DIAGNOSIS — E1140 Type 2 diabetes mellitus with diabetic neuropathy, unspecified: Secondary | ICD-10-CM | POA: Diagnosis not present

## 2023-05-03 DIAGNOSIS — I484 Atypical atrial flutter: Secondary | ICD-10-CM | POA: Diagnosis present

## 2023-05-03 DIAGNOSIS — R0602 Shortness of breath: Secondary | ICD-10-CM | POA: Diagnosis not present

## 2023-05-03 DIAGNOSIS — Z45018 Encounter for adjustment and management of other part of cardiac pacemaker: Secondary | ICD-10-CM

## 2023-05-03 DIAGNOSIS — K449 Diaphragmatic hernia without obstruction or gangrene: Secondary | ICD-10-CM | POA: Diagnosis present

## 2023-05-03 DIAGNOSIS — R296 Repeated falls: Secondary | ICD-10-CM | POA: Diagnosis present

## 2023-05-03 DIAGNOSIS — I509 Heart failure, unspecified: Principal | ICD-10-CM

## 2023-05-03 DIAGNOSIS — Z681 Body mass index (BMI) 19 or less, adult: Secondary | ICD-10-CM | POA: Diagnosis not present

## 2023-05-03 DIAGNOSIS — I4819 Other persistent atrial fibrillation: Secondary | ICD-10-CM | POA: Diagnosis present

## 2023-05-03 DIAGNOSIS — Z96642 Presence of left artificial hip joint: Secondary | ICD-10-CM | POA: Diagnosis present

## 2023-05-03 DIAGNOSIS — Z90711 Acquired absence of uterus with remaining cervical stump: Secondary | ICD-10-CM

## 2023-05-03 DIAGNOSIS — N289 Disorder of kidney and ureter, unspecified: Secondary | ICD-10-CM | POA: Diagnosis not present

## 2023-05-03 DIAGNOSIS — I5031 Acute diastolic (congestive) heart failure: Secondary | ICD-10-CM | POA: Diagnosis not present

## 2023-05-03 DIAGNOSIS — K219 Gastro-esophageal reflux disease without esophagitis: Secondary | ICD-10-CM | POA: Diagnosis present

## 2023-05-03 DIAGNOSIS — E785 Hyperlipidemia, unspecified: Secondary | ICD-10-CM | POA: Diagnosis not present

## 2023-05-03 DIAGNOSIS — D692 Other nonthrombocytopenic purpura: Secondary | ICD-10-CM | POA: Diagnosis not present

## 2023-05-03 DIAGNOSIS — N179 Acute kidney failure, unspecified: Secondary | ICD-10-CM | POA: Diagnosis not present

## 2023-05-03 DIAGNOSIS — F32A Depression, unspecified: Secondary | ICD-10-CM | POA: Insufficient documentation

## 2023-05-03 DIAGNOSIS — Z029 Encounter for administrative examinations, unspecified: Secondary | ICD-10-CM | POA: Diagnosis not present

## 2023-05-03 DIAGNOSIS — Z79899 Other long term (current) drug therapy: Secondary | ICD-10-CM

## 2023-05-03 DIAGNOSIS — Z0000 Encounter for general adult medical examination without abnormal findings: Secondary | ICD-10-CM | POA: Diagnosis not present

## 2023-05-03 DIAGNOSIS — R54 Age-related physical debility: Secondary | ICD-10-CM | POA: Diagnosis present

## 2023-05-03 DIAGNOSIS — E7849 Other hyperlipidemia: Secondary | ICD-10-CM | POA: Diagnosis not present

## 2023-05-03 DIAGNOSIS — G6289 Other specified polyneuropathies: Secondary | ICD-10-CM | POA: Diagnosis not present

## 2023-05-03 DIAGNOSIS — Z7982 Long term (current) use of aspirin: Secondary | ICD-10-CM

## 2023-05-03 DIAGNOSIS — N1831 Chronic kidney disease, stage 3a: Secondary | ICD-10-CM | POA: Diagnosis present

## 2023-05-03 DIAGNOSIS — I110 Hypertensive heart disease with heart failure: Secondary | ICD-10-CM | POA: Diagnosis not present

## 2023-05-03 DIAGNOSIS — I609 Nontraumatic subarachnoid hemorrhage, unspecified: Secondary | ICD-10-CM | POA: Diagnosis not present

## 2023-05-03 LAB — BASIC METABOLIC PANEL
Anion gap: 11 (ref 5–15)
BUN: 23 mg/dL (ref 8–23)
CO2: 24 mmol/L (ref 22–32)
Calcium: 8.5 mg/dL — ABNORMAL LOW (ref 8.9–10.3)
Chloride: 102 mmol/L (ref 98–111)
Creatinine, Ser: 1.66 mg/dL — ABNORMAL HIGH (ref 0.44–1.00)
GFR, Estimated: 30 mL/min — ABNORMAL LOW (ref >60)
Glucose, Bld: 164 mg/dL — ABNORMAL HIGH (ref 70–99)
Potassium: 5 mmol/L (ref 3.5–5.1)
Sodium: 137 mmol/L (ref 135–145)

## 2023-05-03 LAB — TROPONIN I (HIGH SENSITIVITY): Troponin I (High Sensitivity): 22 ng/L — ABNORMAL HIGH (ref <18)

## 2023-05-03 LAB — CBC
HCT: 37.6 % (ref 36.0–46.0)
Hemoglobin: 11.7 g/dL — ABNORMAL LOW (ref 12.0–15.0)
MCH: 33.7 pg (ref 26.0–34.0)
MCHC: 31.1 g/dL (ref 30.0–36.0)
MCV: 108.4 fL — ABNORMAL HIGH (ref 80.0–100.0)
Platelets: 207 10*3/uL (ref 150–400)
RBC: 3.47 MIL/uL — ABNORMAL LOW (ref 3.87–5.11)
RDW: 13 % (ref 11.5–15.5)
WBC: 5.7 10*3/uL (ref 4.0–10.5)
nRBC: 0 % (ref 0.0–0.2)

## 2023-05-03 LAB — BRAIN NATRIURETIC PEPTIDE: B Natriuretic Peptide: 638.3 pg/mL — ABNORMAL HIGH (ref 0.0–100.0)

## 2023-05-03 NOTE — ED Triage Notes (Signed)
Patient reports SOB with lower legs /feet edema onset this week with occasional palpitations . History of AFib / Pacemaker.

## 2023-05-04 ENCOUNTER — Observation Stay (HOSPITAL_COMMUNITY): Payer: Medicare PPO

## 2023-05-04 DIAGNOSIS — N179 Acute kidney failure, unspecified: Secondary | ICD-10-CM

## 2023-05-04 DIAGNOSIS — R718 Other abnormality of red blood cells: Secondary | ICD-10-CM | POA: Diagnosis present

## 2023-05-04 DIAGNOSIS — G629 Polyneuropathy, unspecified: Secondary | ICD-10-CM

## 2023-05-04 DIAGNOSIS — Z833 Family history of diabetes mellitus: Secondary | ICD-10-CM | POA: Diagnosis not present

## 2023-05-04 DIAGNOSIS — Z800 Family history of malignant neoplasm of digestive organs: Secondary | ICD-10-CM | POA: Diagnosis not present

## 2023-05-04 DIAGNOSIS — G6289 Other specified polyneuropathies: Secondary | ICD-10-CM

## 2023-05-04 DIAGNOSIS — I480 Paroxysmal atrial fibrillation: Secondary | ICD-10-CM | POA: Diagnosis not present

## 2023-05-04 DIAGNOSIS — I484 Atypical atrial flutter: Secondary | ICD-10-CM | POA: Diagnosis present

## 2023-05-04 DIAGNOSIS — I4892 Unspecified atrial flutter: Secondary | ICD-10-CM

## 2023-05-04 DIAGNOSIS — E785 Hyperlipidemia, unspecified: Secondary | ICD-10-CM | POA: Diagnosis present

## 2023-05-04 DIAGNOSIS — N1831 Chronic kidney disease, stage 3a: Secondary | ICD-10-CM | POA: Diagnosis present

## 2023-05-04 DIAGNOSIS — I5031 Acute diastolic (congestive) heart failure: Secondary | ICD-10-CM

## 2023-05-04 DIAGNOSIS — N189 Chronic kidney disease, unspecified: Secondary | ICD-10-CM

## 2023-05-04 DIAGNOSIS — Z885 Allergy status to narcotic agent status: Secondary | ICD-10-CM | POA: Diagnosis not present

## 2023-05-04 DIAGNOSIS — I10 Essential (primary) hypertension: Secondary | ICD-10-CM | POA: Diagnosis not present

## 2023-05-04 DIAGNOSIS — I509 Heart failure, unspecified: Secondary | ICD-10-CM | POA: Diagnosis not present

## 2023-05-04 DIAGNOSIS — R54 Age-related physical debility: Secondary | ICD-10-CM | POA: Diagnosis present

## 2023-05-04 DIAGNOSIS — F32A Depression, unspecified: Secondary | ICD-10-CM | POA: Insufficient documentation

## 2023-05-04 DIAGNOSIS — I4819 Other persistent atrial fibrillation: Secondary | ICD-10-CM | POA: Diagnosis present

## 2023-05-04 DIAGNOSIS — D631 Anemia in chronic kidney disease: Secondary | ICD-10-CM | POA: Diagnosis present

## 2023-05-04 DIAGNOSIS — K219 Gastro-esophageal reflux disease without esophagitis: Secondary | ICD-10-CM

## 2023-05-04 DIAGNOSIS — Z90711 Acquired absence of uterus with remaining cervical stump: Secondary | ICD-10-CM | POA: Diagnosis not present

## 2023-05-04 DIAGNOSIS — J9811 Atelectasis: Secondary | ICD-10-CM | POA: Diagnosis present

## 2023-05-04 DIAGNOSIS — R002 Palpitations: Secondary | ICD-10-CM | POA: Diagnosis present

## 2023-05-04 DIAGNOSIS — Z79899 Other long term (current) drug therapy: Secondary | ICD-10-CM | POA: Diagnosis not present

## 2023-05-04 DIAGNOSIS — I5033 Acute on chronic diastolic (congestive) heart failure: Secondary | ICD-10-CM | POA: Diagnosis present

## 2023-05-04 DIAGNOSIS — Z96642 Presence of left artificial hip joint: Secondary | ICD-10-CM | POA: Diagnosis present

## 2023-05-04 DIAGNOSIS — I495 Sick sinus syndrome: Secondary | ICD-10-CM | POA: Diagnosis present

## 2023-05-04 DIAGNOSIS — Z45018 Encounter for adjustment and management of other part of cardiac pacemaker: Secondary | ICD-10-CM | POA: Diagnosis not present

## 2023-05-04 DIAGNOSIS — K449 Diaphragmatic hernia without obstruction or gangrene: Secondary | ICD-10-CM | POA: Diagnosis present

## 2023-05-04 DIAGNOSIS — I130 Hypertensive heart and chronic kidney disease with heart failure and stage 1 through stage 4 chronic kidney disease, or unspecified chronic kidney disease: Secondary | ICD-10-CM | POA: Diagnosis present

## 2023-05-04 LAB — BASIC METABOLIC PANEL
Anion gap: 10 (ref 5–15)
BUN: 21 mg/dL (ref 8–23)
CO2: 26 mmol/L (ref 22–32)
Calcium: 8.7 mg/dL — ABNORMAL LOW (ref 8.9–10.3)
Chloride: 100 mmol/L (ref 98–111)
Creatinine, Ser: 1.47 mg/dL — ABNORMAL HIGH (ref 0.44–1.00)
GFR, Estimated: 35 mL/min — ABNORMAL LOW (ref >60)
Glucose, Bld: 153 mg/dL — ABNORMAL HIGH (ref 70–99)
Potassium: 4.3 mmol/L (ref 3.5–5.1)
Sodium: 136 mmol/L (ref 135–145)

## 2023-05-04 LAB — CBC
HCT: 38.2 % (ref 36.0–46.0)
Hemoglobin: 12 g/dL (ref 12.0–15.0)
MCH: 34 pg (ref 26.0–34.0)
MCHC: 31.4 g/dL (ref 30.0–36.0)
MCV: 108.2 fL — ABNORMAL HIGH (ref 80.0–100.0)
Platelets: 192 10*3/uL (ref 150–400)
RBC: 3.53 MIL/uL — ABNORMAL LOW (ref 3.87–5.11)
RDW: 13.1 % (ref 11.5–15.5)
WBC: 6.2 10*3/uL (ref 4.0–10.5)
nRBC: 0 % (ref 0.0–0.2)

## 2023-05-04 LAB — MAGNESIUM: Magnesium: 1.9 mg/dL (ref 1.7–2.4)

## 2023-05-04 LAB — ECHOCARDIOGRAM COMPLETE
AR max vel: 1.68 cm2
AV Peak grad: 2.9 mmHg
Ao pk vel: 0.85 m/s
Area-P 1/2: 6.54 cm2
Est EF: 50
Height: 64 in
MV M vel: 2.16 m/s
MV Peak grad: 18.7 mmHg
Weight: 1774.26 oz

## 2023-05-04 LAB — TROPONIN I (HIGH SENSITIVITY): Troponin I (High Sensitivity): 23 ng/L — ABNORMAL HIGH (ref <18)

## 2023-05-04 LAB — LAB REPORT - SCANNED: EGFR: 40

## 2023-05-04 MED ORDER — METOPROLOL TARTRATE 50 MG PO TABS
50.0000 mg | ORAL_TABLET | Freq: Two times a day (BID) | ORAL | Status: DC
  Administered 2023-05-04 – 2023-05-06 (×4): 50 mg via ORAL
  Filled 2023-05-04 (×4): qty 1

## 2023-05-04 MED ORDER — ENOXAPARIN SODIUM 30 MG/0.3ML IJ SOSY
30.0000 mg | PREFILLED_SYRINGE | INTRAMUSCULAR | Status: DC
  Administered 2023-05-04: 30 mg via SUBCUTANEOUS
  Filled 2023-05-04: qty 0.3

## 2023-05-04 MED ORDER — TRAZODONE HCL 50 MG PO TABS: 25.0000 mg | ORAL_TABLET | Freq: Every evening | ORAL | Status: DC | PRN

## 2023-05-04 MED ORDER — TRIMETHOPRIM 100 MG PO TABS
100.0000 mg | ORAL_TABLET | Freq: Every day | ORAL | Status: DC
  Administered 2023-05-04 – 2023-05-05 (×2): 100 mg via ORAL
  Filled 2023-05-04 (×3): qty 1

## 2023-05-04 MED ORDER — ASPIRIN 81 MG PO TBEC
81.0000 mg | DELAYED_RELEASE_TABLET | Freq: Every day | ORAL | Status: DC
  Administered 2023-05-04 – 2023-05-05 (×2): 81 mg via ORAL
  Filled 2023-05-04 (×2): qty 1

## 2023-05-04 MED ORDER — ACETAMINOPHEN 325 MG PO TABS
650.0000 mg | ORAL_TABLET | Freq: Four times a day (QID) | ORAL | Status: DC | PRN
  Administered 2023-05-04 – 2023-05-06 (×4): 650 mg via ORAL
  Filled 2023-05-04 (×4): qty 2

## 2023-05-04 MED ORDER — PANTOPRAZOLE SODIUM 40 MG PO TBEC: 40.0000 mg | DELAYED_RELEASE_TABLET | Freq: Every day | ORAL | Status: DC

## 2023-05-04 MED ORDER — ACETAMINOPHEN 650 MG RE SUPP: 650.0000 mg | Freq: Four times a day (QID) | RECTAL | Status: DC | PRN

## 2023-05-04 MED ORDER — ONDANSETRON HCL 4 MG PO TABS
4.0000 mg | ORAL_TABLET | Freq: Four times a day (QID) | ORAL | Status: DC | PRN
  Administered 2023-05-05: 4 mg via ORAL
  Filled 2023-05-04: qty 1

## 2023-05-04 MED ORDER — GABAPENTIN 300 MG PO CAPS
300.0000 mg | ORAL_CAPSULE | Freq: Two times a day (BID) | ORAL | Status: DC
  Administered 2023-05-04 – 2023-05-06 (×4): 300 mg via ORAL
  Filled 2023-05-04 (×4): qty 1

## 2023-05-04 MED ORDER — FUROSEMIDE 10 MG/ML IJ SOLN
20.0000 mg | Freq: Once | INTRAMUSCULAR | Status: AC
  Administered 2023-05-04: 20 mg via INTRAVENOUS
  Filled 2023-05-04: qty 2

## 2023-05-04 MED ORDER — DILTIAZEM HCL 25 MG/5ML IV SOLN
5.0000 mg | Freq: Once | INTRAVENOUS | Status: AC
  Administered 2023-05-04: 5 mg via INTRAVENOUS
  Filled 2023-05-04: qty 5

## 2023-05-04 MED ORDER — ONDANSETRON HCL 4 MG/2ML IJ SOLN: 4.0000 mg | Freq: Four times a day (QID) | INTRAMUSCULAR | Status: DC | PRN

## 2023-05-04 MED ORDER — METOPROLOL TARTRATE 25 MG PO TABS: 25.0000 mg | ORAL_TABLET | Freq: Two times a day (BID) | ORAL | Status: DC

## 2023-05-04 MED ORDER — PERFLUTREN LIPID MICROSPHERE
1.0000 mL | INTRAVENOUS | Status: AC | PRN
  Administered 2023-05-04: 2 mL via INTRAVENOUS

## 2023-05-04 MED ORDER — METOPROLOL TARTRATE 25 MG PO TABS
25.0000 mg | ORAL_TABLET | Freq: Two times a day (BID) | ORAL | Status: DC
  Administered 2023-05-04: 25 mg via ORAL
  Filled 2023-05-04: qty 1

## 2023-05-04 MED ORDER — SIMVASTATIN 20 MG PO TABS
20.0000 mg | ORAL_TABLET | Freq: Every evening | ORAL | Status: DC
  Administered 2023-05-04 – 2023-05-05 (×2): 20 mg via ORAL
  Filled 2023-05-04 (×2): qty 1

## 2023-05-04 MED ORDER — PANTOPRAZOLE SODIUM 40 MG PO TBEC
40.0000 mg | DELAYED_RELEASE_TABLET | Freq: Two times a day (BID) | ORAL | Status: DC
  Administered 2023-05-04 – 2023-05-06 (×5): 40 mg via ORAL
  Filled 2023-05-04 (×4): qty 1

## 2023-05-04 MED ORDER — MAGNESIUM HYDROXIDE 400 MG/5ML PO SUSP: 30.0000 mL | Freq: Every day | ORAL | Status: DC | PRN

## 2023-05-04 MED ORDER — LATANOPROST 0.005 % OP SOLN
1.0000 [drp] | Freq: Every day | OPHTHALMIC | Status: DC
  Administered 2023-05-04 – 2023-05-05 (×2): 1 [drp] via OPHTHALMIC
  Filled 2023-05-04: qty 2.5

## 2023-05-04 MED ORDER — FUROSEMIDE 10 MG/ML IJ SOLN
40.0000 mg | Freq: Two times a day (BID) | INTRAMUSCULAR | Status: DC
  Administered 2023-05-04 – 2023-05-05 (×3): 40 mg via INTRAVENOUS
  Filled 2023-05-04 (×3): qty 4

## 2023-05-04 MED ORDER — GABAPENTIN 400 MG PO CAPS
1200.0000 mg | ORAL_CAPSULE | Freq: Two times a day (BID) | ORAL | Status: DC
  Administered 2023-05-04: 1200 mg via ORAL
  Filled 2023-05-04: qty 3

## 2023-05-04 MED ORDER — MECLIZINE HCL 12.5 MG PO TABS: 12.5000 mg | ORAL_TABLET | Freq: Four times a day (QID) | ORAL | Status: DC | PRN

## 2023-05-04 MED ORDER — GABAPENTIN 300 MG PO CAPS: 300.0000 mg | ORAL_CAPSULE | Freq: Two times a day (BID) | ORAL | Status: DC

## 2023-05-04 MED ORDER — ESCITALOPRAM OXALATE 10 MG PO TABS
5.0000 mg | ORAL_TABLET | Freq: Every day | ORAL | Status: DC
  Administered 2023-05-04 – 2023-05-06 (×3): 5 mg via ORAL
  Filled 2023-05-04 (×3): qty 1

## 2023-05-04 NOTE — Assessment & Plan Note (Signed)
-   I suspect diastolic 83.  -The patient will be admitted to a cardiac telemetry bed. - We will continue diuresis with IV Lasix. - We Will follow serial troponins. - We will follow I's and O's and daily weights. - Cardiology consult be obtained. - I notified Salley Hews about the patient.

## 2023-05-04 NOTE — Assessment & Plan Note (Addendum)
-   She has a history of paroxysmal atrial flutter and fibrillation with RVR. - She was given 5 mg of IV Cardizem.  Will repeat and place her on IV Cardizem drip if needed. - We will continue Lopressor starting it early. - The patient is at high fall risk as well as bleeding with previous history of subarachnoid hemorrhage and therefore is on no anticoagulation. - We will continue enteric-coated baby aspirin.

## 2023-05-04 NOTE — H&P (Signed)
[name]   PATIENT NAME: Anne Anthony    MR#:  [id number]  DATE OF BIRTH:  05/06/1936  DATE OF ADMISSION:  05/03/2023  PRIMARY CARE PHYSICIAN: [name], MD   Patient is coming from: Home  REQUESTING/REFERRING PHYSICIAN: Kennis Carina, MD  CHIEF COMPLAINT:   Chief Complaint  Patient presents with   Shortness of Breath    Lower Legs/Feet Edema    HISTORY OF PRESENT ILLNESS:  Anne Anthony is a 87 y.o. Caucasian female with medical history significant for paroxysmal atrial fibrillation/flutter, diverticulosis, subarachnoid hemorrhage, hypertension, dyslipidemia, tachybradycardia syndrome, status post pacemaker placement, who presented to the emergency room with acute onset of worsening dyspnea as well as lower extremity edema over the last several days with associated orthopnea and dyspnea on exertion.  No chest pain or palpitations.  No cough or wheezing or hemoptysis.  No nausea or vomiting or abdominal pain.  No dysuria, oliguria or hematuria or flank pain.  ED Course: When she came to the ER, heart rate was 129 and later 132 with otherwise normal vital signs.  Labs revealed a creatinine of 1.66 up from 1.17 on 07/04/2021 and blood glucose of 164, BNP 638.3 and high sensitive troponin I 22 and later 23.  CBC showed mild anemia with hemoglobin 11.7 down from 13.7 on 07/04/2021 with a hematocrit of 37.6 and microcytosis. EKG as reviewed by me : Atrial flutter with variable block with a rate of 98. Imaging: Two-view chest x-ray showed bibasilar atelectasis, cardiomegaly and large hiatal hernia with intrathoracic stomach and colon.  The patient was given 20 mg of IV Lasix.  She will be admitted to the cardiac telemetry observation bed for further evaluation and management. PAST MEDICAL HISTORY:   Past Medical History:  Diagnosis Date   Atrial flutter (HCC)    successful radiofrequency ablation 2007   Diverticula of colon    Family history of colon cancer    brother    Hiatal hernia    Hyperlipidemia    Hypertension    Pacemaker    Paroxysmal atrial fibrillation (HCC)    Severe protein-calorie malnutrition (HCC) 11/17/2014   Tachycardia-bradycardia syndrome (HCC)    permament pacermaker, dual chamber Medtronic on 06/2011   Tubular adenoma   -History of subarachnoid hemorrhage  PAST SURGICAL HISTORY:   Past Surgical History:  Procedure Laterality Date   ABDOMINAL SURGERY     COLONOSCOPY  08/17/2010   Dr. Jena Gauss- internal hemorrhoids, o/w normal rectum, L sided diverticula, tubular adenoma   ESOPHAGOGASTRODUODENOSCOPY  10/502011   Dr. Jena Gauss- huge diaphragmatic/hiatial hernia, fundal gland polyps not manipulated o/w noral appearing gastric mucosa, patent pylorus, normal D1 & D2   HIATAL HERNIA REPAIR  2005   HIP ARTHROPLASTY Left 11/16/2014   Procedure: ARTHROPLASTY BIPOLAR HIP;  Surgeon: Shelda Pal, MD;  Location: [facility];  Service: Orthopedics;  Laterality: Left;   PARTIAL HYSTERECTOMY     permament pacemaker  06/2011   dual chamber , Medtronic Adapta serial # [id number]  last checked 02/05/2013   RADIOFREQUENCY ABLATION  06/28/2006    SOCIAL HISTORY:   Social History   Tobacco Use   Smoking status: Never   Smokeless tobacco: Never  Substance Use Topics   Alcohol use: No    FAMILY HISTORY:   Family History  Problem Relation Age of Onset   Diabetes Mother     DRUG ALLERGIES:   Allergies  Allergen Reactions   Morphine And Codeine Other (See Comments)  Hallucination    REVIEW OF SYSTEMS:   ROS As per history of present illness. All pertinent systems were reviewed above. Constitutional, HEENT, cardiovascular, respiratory, GI, GU, musculoskeletal, neuro, psychiatric, endocrine, integumentary and hematologic systems were reviewed and are otherwise negative/unremarkable except for positive findings mentioned above in the HPI.   MEDICATIONS AT HOME:   Prior to Admission medications   Medication Sig Start Date End Date Taking?  Authorizing Provider  acetaminophen (TYLENOL) 325 MG tablet Take 1-2 tablets (325-650 mg total) by mouth every 4 (four) hours as needed for fever, headache or mild pain. 11/18/14  Yes Lanney Gins, PA-C  aspirin EC 81 MG tablet Take 81 mg by mouth at bedtime.   Yes [provider]  dexlansoprazole (DEXILANT) 60 MG capsule Take 1 capsule (60 mg total) by mouth daily. 09/14/17  Yes Croitoru, Mihai, MD  escitalopram (LEXAPRO) 10 MG tablet Take 5 mg by mouth daily. 09/06/18  Yes [provider]  gabapentin (NEURONTIN) 600 MG tablet Take 1,200 mg by mouth 2 (two) times daily. 07/01/21  Yes [provider]  LUMIGAN 0.01 % SOLN Place 1 drop into both eyes at bedtime. 04/16/22  Yes [provider]  meclizine (ANTIVERT) 25 MG tablet Take 25 mg by mouth every 6 (six) hours as needed for dizziness. 06/16/21  Yes [provider]  metoprolol tartrate (LOPRESSOR) 25 MG tablet Take 25 mg by mouth 2 (two) times daily. 10/02/14  Yes [provider]  simvastatin (ZOCOR) 20 MG tablet Take 20 mg by mouth every evening.   Yes [provider]  trimethoprim (TRIMPEX) 100 MG tablet TAKE 1 TABLET BY MOUTH EVERYDAY AT BEDTIME Patient taking differently: Take 100 mg by mouth at bedtime. 02/06/23  Yes McKenzie, Mardene Celeste, MD      VITAL SIGNS:  Blood pressure (!) 135/97, pulse (!) 130, temperature 99.2 F (37.3 C), temperature source Oral, resp. rate 20, height 5\' 4"  (1.626 m), weight 50.3 kg, SpO2 96 %.  PHYSICAL EXAMINATION:  Physical Exam  GENERAL:  87 y.o.-year-old Caucasian female patient lying in the bed with no acute distress.  EYES: Pupils equal, round, reactive to light and accommodation. No scleral icterus. Extraocular muscles intact.  HEENT: Head atraumatic, normocephalic. Oropharynx and nasopharynx clear.  NECK:  Supple, no jugular venous distention. No thyroid enlargement, no tenderness.  LUNGS: Diminished bibasal breath sounds with bibasal rales..  No use of accessory muscles of respiration.  CARDIOVASCULAR: Regular rate and rhythm, S1, S2 normal. No murmurs, rubs, or gallops.  ABDOMEN: Soft, nondistended, nontender. Bowel sounds present. No organomegaly or mass.  EXTREMITIES: 1-2+ bilateral lower extremity pitting edema with no cyanosis, or clubbing.  NEUROLOGIC: Cranial nerves II through XII are intact. Muscle strength 5/5 in all extremities. Sensation intact. Gait not checked.  PSYCHIATRIC: The patient is alert and oriented x 3.  Normal affect and good eye contact. SKIN: No obvious rash, lesion, or ulcer.   LABORATORY PANEL:   CBC Recent Labs  Lab 05/03/23 2225  WBC 5.7  HGB 11.7*  HCT 37.6  PLT 207   ------------------------------------------------------------------------------------------------------------------  Chemistries  Recent Labs  Lab 05/03/23 2225  NA 137  K 5.0  CL 102  CO2 24  GLUCOSE 164*  BUN 23  CREATININE 1.66*  CALCIUM 8.5*   ------------------------------------------------------------------------------------------------------------------  Cardiac Enzymes No results for input(s): "TROPONINI" in the last 168 hours. ------------------------------------------------------------------------------------------------------------------  RADIOLOGY:  DG Chest 2 View  Result Date: 05/03/2023 CLINICAL DATA:  Shortness of breath with lower extremity edema EXAM: CHEST -  2 VIEW COMPARISON:  Radiograph 07/04/2021 FINDINGS: Stable enlargement of the cardiomediastinal silhouette. Aortic atherosclerotic calcification. Left chest wall pacemaker. Large hiatal hernia containing intrathoracic stomach and colon. Elevated left hemidiaphragm. Left greater than right basilar atelectasis. No definite pleural effusion no pneumothorax. IMPRESSION: 1. Large hiatal hernia with intrathoracic stomach and colon. 2. Bibasilar atelectasis. 3. Cardiomegaly. Electronically Signed   By: Minerva Fester M.D.   On: 05/03/2023 22:40       IMPRESSION AND PLAN:  Assessment and Plan: * New onset of congestive heart failure (HCC) - I suspect diastolic 83.  -The patient will be admitted to a cardiac telemetry bed. - We will continue diuresis with IV Lasix. - We Will follow serial troponins. - We will follow I's and O's and daily weights. - Cardiology consult be obtained. - I notified Salley Hews about the patient.   Paroxysmal atrial fibrillation with RVR (HCC) - She has a history of paroxysmal atrial flutter and fibrillation with RVR. - She was given 5 mg of IV Cardizem.  Will repeat and place her on IV Cardizem drip if needed. - We will continue Lopressor starting it early. - The patient is at high fall risk as well as bleeding with previous history of subarachnoid hemorrhage and therefore is on no anticoagulation. - We will continue enteric-coated baby aspirin.  Acute kidney injury superimposed on chronic kidney disease (HCC) - This is superimposed on stage IIIa chronic kidney disease. - This is likely prerenal due to her acute CHF. - Will follow BMP with diuresis. - We will avoid nephrotoxins.  Peripheral neuropathy - We will continue Neurontin.  Depression - We will continue Lexapro.  Dyslipidemia - We will continue statin therapy.  Essential hypertension - We will continue her Lopressor.  GERD without esophagitis - We will continue PPI therapy.     DVT prophylaxis: Lovenox.  Advanced Care Planning:  Code Status: full code.  Family Communication:  The plan of care was discussed in details with the patient (and family). I answered all questions. The patient agreed to proceed with the above mentioned plan. Further management will depend upon hospital course. Disposition Plan: Back to previous home environment Consults called: Cardiology All the records are reviewed and case discussed with ED provider.  Status is: Observation  I certify that at the time of admission, it is my clinical  judgment that the patient will require hospital care extending less than 2 midnights.                            Dispo: The patient is from: Home              Anticipated d/c is to: Home              Patient currently is not medically stable to d/c.              Difficult to place patient: No  Hannah Beat M.D on 05/04/2023 at 4:52 AM  [facility]   From 7 PM-7 AM, contact night-coverage [email or web address]  CC: Primary care physician; [name], MD

## 2023-05-04 NOTE — ED Provider Notes (Signed)
[facility]PT [facility] Emergency Department Provider Note MRN:  [id number]  Arrival date & time: 05/04/23     Chief Complaint   Shortness of Breath (Lower Legs/Feet Edema)   History of Present Illness   Anne Anthony is a 87 y.o. year-old female with a history of atrial flutter, tachybradycardia syndrome, pacemaker presenting to the ED with chief complaint of shortness of breath.  Increasing lower extremity edema and shortness of breath over the past several days.  Worse when laying flat, worse when ambulating.  No chest pain, no abdominal pain, no fever or cough.  Review of Systems  A thorough review of systems was obtained and all systems are negative except as noted in the HPI and PMH.   Patient's Health History    Past Medical History:  Diagnosis Date   Atrial flutter (HCC)    successful radiofrequency ablation 2007   Diverticula of colon    Family history of colon cancer    brother   Hiatal hernia    Hyperlipidemia    Hypertension    Pacemaker    Paroxysmal atrial fibrillation (HCC)    Severe protein-calorie malnutrition (HCC) 11/17/2014   Tachycardia-bradycardia syndrome (HCC)    permament pacermaker, dual chamber Medtronic on 06/2011   Tubular adenoma     Past Surgical History:  Procedure Laterality Date   ABDOMINAL SURGERY     COLONOSCOPY  08/17/2010   Dr. Jena Gauss- internal hemorrhoids, o/w normal rectum, L sided diverticula, tubular adenoma   ESOPHAGOGASTRODUODENOSCOPY  10/502011   Dr. Jena Gauss- huge diaphragmatic/hiatial hernia, fundal gland polyps not manipulated o/w noral appearing gastric mucosa, patent pylorus, normal D1 & D2   HIATAL HERNIA REPAIR  2005   HIP ARTHROPLASTY Left 11/16/2014   Procedure: ARTHROPLASTY BIPOLAR HIP;  Surgeon: Shelda Pal, MD;  Location: [facility];  Service: Orthopedics;  Laterality: Left;   PARTIAL HYSTERECTOMY     permament pacemaker  06/2011   dual chamber , Medtronic Adapta serial # [id number]  last checked 02/05/2013    RADIOFREQUENCY ABLATION  06/28/2006    Family History  Problem Relation Age of Onset   Diabetes Mother     Social History   Socioeconomic History   Marital status: Widowed    Spouse name: Not on file   Number of children: 4   Years of education: Not on file   Highest education level: Not on file  Occupational History   Not on file  Tobacco Use   Smoking status: Never   Smokeless tobacco: Never  Vaping Use   Vaping Use: Never used  Substance and Sexual Activity   Alcohol use: No   Drug use: No   Sexual activity: Not on file  Other Topics Concern   Not on file  Social History Narrative   Not on file   Social Determinants of Health   Financial Resource Strain: Not on file  Food Insecurity: Not on file  Transportation Needs: Not on file  Physical Activity: Not on file  Stress: Not on file  Social Connections: Not on file  Intimate Partner Violence: Not on file     Physical Exam   Vitals:   05/04/23 0218 05/04/23 0230  BP:  (!) 147/96  Pulse:  (!) 127  Resp:  (!) 22  Temp: 97.8 F (36.6 C)   SpO2:  96%    CONSTITUTIONAL: Chronically ill-appearing, NAD NEURO/PSYCH:  Alert and oriented x 3, no focal deficits EYES:  eyes equal and reactive ENT/NECK:  no LAD, no  JVD CARDIO: Tachycardic rate, well-perfused, normal S1 and S2 PULM:  CTAB no wheezing or rhonchi GI/GU:  non-distended, non-tender MSK/SPINE:  No gross deformities, no edema SKIN:  no rash, atraumatic   *Additional and/or pertinent findings included in MDM below  Diagnostic and Interventional Summary    EKG Interpretation  Date/Time:  Thursday May 03 2023 22:22:44 EDT Ventricular Rate:  98 PR Interval:    QRS Duration: 84 QT Interval:  348 QTC Calculation: 444 R Axis:   74 Text Interpretation: Atrial flutter with variable A-V block Abnormal ECG When compared with ECG of 27-Jan-2015 13:13, PREVIOUS ECG IS PRESENT Confirmed by Kennis Carina [phone]) on 05/04/2023 2:33:07 AM       Labs  Reviewed  BASIC METABOLIC PANEL - Abnormal; Notable for the following components:      Result Value   Glucose, Bld 164 (*)    Creatinine, Ser 1.66 (*)    Calcium 8.5 (*)    GFR, Estimated 30 (*)    All other components within normal limits  CBC - Abnormal; Notable for the following components:   RBC 3.47 (*)    Hemoglobin 11.7 (*)    MCV 108.4 (*)    All other components within normal limits  BRAIN NATRIURETIC PEPTIDE - Abnormal; Notable for the following components:   B Natriuretic Peptide 638.3 (*)    All other components within normal limits  TROPONIN I (HIGH SENSITIVITY) - Abnormal; Notable for the following components:   Troponin I (High Sensitivity) 22 (*)    All other components within normal limits  TROPONIN I (HIGH SENSITIVITY) - Abnormal; Notable for the following components:   Troponin I (High Sensitivity) 23 (*)    All other components within normal limits    DG Chest 2 View  Final Result      Medications  furosemide (LASIX) injection 20 mg (20 mg Intravenous Given 05/04/23 0229)     Procedures  /  Critical Care .Critical Care  Performed by: Sabas Sous, MD Authorized by: Sabas Sous, MD   Critical care provider statement:    Critical care time (minutes):  32   Critical care was necessary to treat or prevent imminent or life-threatening deterioration of the following conditions: Atrial flutter with RVR.   Critical care was time spent personally by me on the following activities:  Development of treatment plan with patient or surrogate, discussions with consultants, evaluation of patient's response to treatment, examination of patient, ordering and review of laboratory studies, ordering and review of radiographic studies, ordering and performing treatments and interventions, pulse oximetry, re-evaluation of patient's condition and review of old charts   ED Course and Medical Decision Making  Initial Impression and Ddx Symptoms suspicious for new onset  CHF.  Orthopnea, dyspnea on exertion, lower extremity edema, elevated BNP.  Chest x-ray overall reassuring.  Patient also in what appears to be atrial flutter with mildly elevated rates.  Past medical/surgical history that increases complexity of ED encounter: Tachybradycardia syndrome with pacemaker  Interpretation of Diagnostics I personally reviewed the EKG and my interpretation is as follows: Possible atrial flutter with variable AV block  Labs reveal AKI, BNP elevation, minimal elevation of troponin.  Patient Reassessment and Ultimate Disposition/Management     Admission to medicine.  Patient management required discussion with the following services or consulting groups:  Hospitalist Service  Complexity of Problems Addressed Acute illness or injury that poses threat of life of bodily function  Additional Data Reviewed and Analyzed Further history obtained  from: Further history from spouse/family member  Additional Factors Impacting ED Encounter Risk Consideration of hospitalization  Elmer Sow. Pilar Plate, MD [facility] Emergency Medicine [facility] [email or web address]  Final Clinical Impressions(s) / ED Diagnoses     ICD-10-CM   1. Acute on chronic congestive heart failure, unspecified heart failure type (HCC)  I50.9     2. Atrial flutter with rapid ventricular response (HCC)  I48.92     3. AKI (acute kidney injury) (HCC)  N17.9       ED Discharge Orders     None        Discharge Instructions Discussed with and Provided to Patient:   Discharge Instructions   None      Sabas Sous, MD 05/04/23 [phone]

## 2023-05-04 NOTE — ED Notes (Signed)
ED TO INPATIENT HANDOFF REPORT  ED Nurse Name and Phone #: [phone] Anne Anthony  S Name/Age/Gender Anne Anthony 87 y.o. female Room/Bed: 032C/032C  Code Status   Code Status: Prior  Home/SNF/Other Home Patient oriented to: self, place, time, and situation Is this baseline? Yes   Triage Complete: Triage complete  Chief Complaint New onset of congestive heart failure (HCC) [I50.9]  Triage Note Patient reports SOB with lower legs /feet edema onset this week with occasional palpitations . History of AFib / Pacemaker.    Allergies Allergies  Allergen Reactions   Morphine And Codeine Other (See Comments)    Hallucination    Level of Care/Admitting Diagnosis ED Disposition     ED Disposition  Admit   Condition  --   Comment  Hospital Area: [facility] [[id number]]  Level of Care: Telemetry Cardiac [[id number]]  May place patient in observation at [facility] or [facility] if equivalent level of care is available:: No  Covid Evaluation: Asymptomatic - no recent exposure (last 10 days) testing not required  Diagnosis: New onset of congestive heart failure [facility]) [[id number]]  Admitting Physician: Anne Anthony [[id number]]  Attending Physician: Anne Anthony [[id number]]          B Medical/Surgery History Past Medical History:  Diagnosis Date   Atrial flutter (HCC)    successful radiofrequency ablation 2007   Diverticula of colon    Family history of colon cancer    brother   Hiatal hernia    Hyperlipidemia    Hypertension    Pacemaker    Paroxysmal atrial fibrillation (HCC)    Severe protein-calorie malnutrition (HCC) 11/17/2014   Tachycardia-bradycardia syndrome (HCC)    permament pacermaker, dual chamber Medtronic on 06/2011   Tubular adenoma    Past Surgical History:  Procedure Laterality Date   ABDOMINAL SURGERY     COLONOSCOPY  08/17/2010   Dr. Jena Anthony- internal hemorrhoids, o/w normal rectum, L sided diverticula, tubular adenoma    ESOPHAGOGASTRODUODENOSCOPY  10/502011   Dr. Jena Anthony- huge diaphragmatic/hiatial hernia, fundal gland polyps not manipulated o/w noral appearing gastric mucosa, patent pylorus, normal D1 & D2   HIATAL HERNIA REPAIR  2005   HIP ARTHROPLASTY Left 11/16/2014   Procedure: ARTHROPLASTY BIPOLAR HIP;  Surgeon: Anne Pal, MD;  Location: [facility];  Service: Orthopedics;  Laterality: Left;   PARTIAL HYSTERECTOMY     permament pacemaker  06/2011   dual chamber , Medtronic Adapta serial # [id number]  last checked 02/05/2013   RADIOFREQUENCY ABLATION  06/28/2006     A IV Location/Drains/Wounds Patient Lines/Drains/Airways Status     Active Line/Drains/Airways     Name Placement date Placement time Site Days   Peripheral IV 05/04/23 20 G Anterior;Proximal;Right Forearm 05/04/23  0227  Forearm  less than 1            Intake/Output Last 24 hours  Intake/Output Summary (Last 24 hours) at 05/04/2023 0359 Last data filed at 05/04/2023 0354 Gross per 24 hour  Intake --  Output 1000 ml  Net -1000 ml    Labs/Imaging Results for orders placed or performed during the hospital encounter of 05/03/23 (from the past 48 hour(s))  Basic metabolic panel     Status: Abnormal   Collection Time: 05/03/23 10:25 PM  Result Value Ref Range   Sodium 137 135 - 145 mmol/L   Potassium 5.0 3.5 - 5.1 mmol/L   Chloride 102 98 - 111 mmol/L   CO2 24 22 - 32 mmol/L  Glucose, Bld 164 (H) 70 - 99 mg/dL    Comment: Glucose reference range applies only to samples taken after fasting for at least 8 hours.   BUN 23 8 - 23 mg/dL   Creatinine, Ser 3.24 (H) 0.44 - 1.00 mg/dL   Calcium 8.5 (L) 8.9 - 10.3 mg/dL   GFR, Estimated 30 (L) >60 mL/min    Comment: (NOTE) Calculated using the CKD-EPI Creatinine Equation (2021)    Anion gap 11 5 - 15    Comment: Performed at [facility], [address] [place], [place] [address]  CBC     Status: Abnormal   Collection Time: 05/03/23 10:25 PM  Result Value Ref Range   WBC  5.7 4.0 - 10.5 K/uL   RBC 3.47 (L) 3.87 - 5.11 MIL/uL   Hemoglobin 11.7 (L) 12.0 - 15.0 g/dL   HCT 72.5 36.6 - 44.0 %   MCV 108.4 (H) 80.0 - 100.0 fL   MCH 33.7 26.0 - 34.0 pg   MCHC 31.1 30.0 - 36.0 g/dL   RDW 34.7 42.5 - 95.6 %   Platelets 207 150 - 400 K/uL   nRBC 0.0 0.0 - 0.2 %    Comment: Performed at [facility], [address] [place], [place] [address]  Troponin I (High Sensitivity)     Status: Abnormal   Collection Time: 05/03/23 10:25 PM  Result Value Ref Range   Troponin I (High Sensitivity) 22 (H) <18 ng/L    Comment: (NOTE) Elevated high sensitivity troponin I (hsTnI) values and significant  changes across serial measurements may suggest ACS but many other  chronic and acute conditions are known to elevate hsTnI results.  Refer to the "Links" section for chest pain algorithms and additional  guidance. Performed at [facility], [address], [place], [place] [address]   Brain natriuretic peptide     Status: Abnormal   Collection Time: 05/03/23 10:25 PM  Result Value Ref Range   B Natriuretic Peptide 638.3 (H) 0.0 - 100.0 pg/mL    Comment: Performed at [facility], [address] [place], [place] [address]  Troponin I (High Sensitivity)     Status: Abnormal   Collection Time: 05/04/23 12:30 AM  Result Value Ref Range   Troponin I (High Sensitivity) 23 (H) <18 ng/L    Comment: (NOTE) Elevated high sensitivity troponin I (hsTnI) values and significant  changes across serial measurements may suggest ACS but many other  chronic and acute conditions are known to elevate hsTnI results.  Refer to the "Links" section for chest pain algorithms and additional  guidance. Performed at [facility], [address], [place], [place] [address]    DG Chest 2 View  Result Date: 05/03/2023 CLINICAL DATA:  Shortness of breath with lower extremity edema EXAM: CHEST - 2 VIEW COMPARISON:  Radiograph 07/04/2021 FINDINGS: Stable enlargement of the  cardiomediastinal silhouette. Aortic atherosclerotic calcification. Left chest wall pacemaker. Large hiatal hernia containing intrathoracic stomach and colon. Elevated left hemidiaphragm. Left greater than right basilar atelectasis. No definite pleural effusion no pneumothorax. IMPRESSION: 1. Large hiatal hernia with intrathoracic stomach and colon. 2. Bibasilar atelectasis. 3. Cardiomegaly. Electronically Signed   By: Minerva Fester M.D.   On: 05/03/2023 22:40    Pending Labs Unresulted Labs (From admission, onward)    None       Vitals/Pain Today's Vitals   05/04/23 0300 05/04/23 0315 05/04/23 0330 05/04/23 0345  BP: (!) 136/95 (!) 147/103 Marland Kitchen)  128/90 (!) 132/92  Pulse: (!) 129 (!) 129 (!) 133 (!) 128  Resp: (!) 23 (!) 22 (!) 21 (!) 21  Temp:      TempSrc:      SpO2: 95% 95% 99% 99%  PainSc:        Isolation Precautions No active isolations  Medications Medications  furosemide (LASIX) injection 20 mg (20 mg Intravenous Given 05/04/23 0229)    Mobility walks     Focused Assessments Cardiac Assessment Handoff:    Lab Results  Component Value Date   TROPONINI <0.03 11/14/2014   No results found for: "DDIMER" Does the Patient currently have chest pain? No    R Recommendations: See Admitting Provider Note  Report given to:   Additional Notes: Patient a/o x 4  vs wnl 132/92 20 118 100%ra 97.8temp ambulates with assist in afib has sick sinus syndrome causing elevated hr pacemaker interogated and working properly

## 2023-05-04 NOTE — Assessment & Plan Note (Signed)
-   This is superimposed on stage IIIa chronic kidney disease. - This is likely prerenal due to her acute CHF. - Will follow BMP with diuresis. - We will avoid nephrotoxins.

## 2023-05-04 NOTE — Assessment & Plan Note (Addendum)
-   We will continue PPI therapy 

## 2023-05-04 NOTE — Assessment & Plan Note (Signed)
-   We will continue Lexapro. 

## 2023-05-04 NOTE — Consult Note (Signed)
Cardiology Consultation   Patient ID: Anne Anthony MRN: [id number]; DOB: 1936/05/17  Admit date: 05/03/2023 Date of Consult: 05/04/2023  PCP:  [name], MD   [facility] Providers Cardiologist:  Thurmon Fair, MD    Patient Profile:   Anne Anthony is a 87 y.o. female with a hx of paroxysmal atrial fibrillation (note amiodarone discontinued previously due to concern about restrictive changes on pulmonary function tests; not on anticoagulation given history of frequent falls and subarachnoid hemorrhage), atrial flutter status post ablation, herniation of the colon and several small bowel loops into the chest causing compressive atelectasis in both lungs as well as restrictive ventilatory defect, symptomatic bradycardia requiring pacemaker placement in 2012, hypertension, hyperlipidemia who is being seen 05/04/2023 for the evaluation of acute congestive heart failure and recurrent atrial flutter at the request of Ramiro Harvest, MD.  History of Present Illness:   Most recent echocardiogram August 2015 showed normal LV function and mild left atrial enlargement.  Patient states that for the past 1 month she has had worsening dyspnea on exertion.  She also describes bilateral lower extremity edema and orthopnea.  She denies chest pain, palpitations or syncope.  She was admitted with congestive heart failure and atrial flutter and cardiology asked to evaluate.   Past Medical History:  Diagnosis Date   Atrial flutter (HCC)    successful radiofrequency ablation 2007   Diverticula of colon    Family history of colon cancer    brother   Hiatal hernia    Hyperlipidemia    Hypertension    Pacemaker    Paroxysmal atrial fibrillation (HCC)    Severe protein-calorie malnutrition (HCC) 11/17/2014   Tachycardia-bradycardia syndrome (HCC)    permament pacermaker, dual chamber Medtronic on 06/2011   Tubular adenoma     Past Surgical History:  Procedure Laterality Date    ABDOMINAL SURGERY     COLONOSCOPY  08/17/2010   Dr. Jena Gauss- internal hemorrhoids, o/w normal rectum, L sided diverticula, tubular adenoma   ESOPHAGOGASTRODUODENOSCOPY  10/502011   Dr. Jena Gauss- huge diaphragmatic/hiatial hernia, fundal gland polyps not manipulated o/w noral appearing gastric mucosa, patent pylorus, normal D1 & D2   HIATAL HERNIA REPAIR  2005   HIP ARTHROPLASTY Left 11/16/2014   Procedure: ARTHROPLASTY BIPOLAR HIP;  Surgeon: Shelda Pal, MD;  Location: [facility];  Service: Orthopedics;  Laterality: Left;   PARTIAL HYSTERECTOMY     permament pacemaker  06/2011   dual chamber , Medtronic Adapta serial # [id number]  last checked 02/05/2013   RADIOFREQUENCY ABLATION  06/28/2006     Inpatient Medications: Scheduled Meds:  aspirin EC  81 mg Oral QHS   enoxaparin (LOVENOX) injection  30 mg Subcutaneous Q24H   escitalopram  5 mg Oral Daily   furosemide  40 mg Intravenous Q12H   gabapentin  1,200 mg Oral BID   latanoprost  1 drop Both Eyes QHS   metoprolol tartrate  50 mg Oral BID   pantoprazole  40 mg Oral BID AC   simvastatin  20 mg Oral QPM   trimethoprim  100 mg Oral QHS   Continuous Infusions:  PRN Meds: acetaminophen **OR** acetaminophen, magnesium hydroxide, meclizine, ondansetron **OR** ondansetron (ZOFRAN) IV, traZODone  Allergies:    Allergies  Allergen Reactions   Morphine And Codeine Other (See Comments)    Hallucination    Social History:   Social History   Socioeconomic History   Marital status: Widowed    Spouse name: Not on file   Number  of children: 4   Years of education: Not on file   Highest education level: Not on file  Occupational History   Not on file  Tobacco Use   Smoking status: Never   Smokeless tobacco: Never  Vaping Use   Vaping Use: Never used  Substance and Sexual Activity   Alcohol use: No   Drug use: No   Sexual activity: Not on file  Other Topics Concern   Not on file  Social History Narrative   Not on file   Social  Determinants of Health   Financial Resource Strain: Not on file  Food Insecurity: Not on file  Transportation Needs: Not on file  Physical Activity: Not on file  Stress: Not on file  Social Connections: Not on file  Intimate Partner Violence: Not on file    Family History:    Family History  Problem Relation Age of Onset   Diabetes Mother      ROS:  Please see the history of present illness.  Patient denies fevers, chills, productive cough or hemoptysis. All other ROS reviewed and negative.     Physical Exam/Data:   Vitals:   05/04/23 0430 05/04/23 0446 05/04/23 0504 05/04/23 0751  BP: (!) 135/97  (!) 143/97 115/73  Pulse: (!) 130  (!) 132 (!) 45  Resp: 20  18 16   Temp:  99.2 F (37.3 C) 98 F (36.7 C) 97.9 F (36.6 C)  TempSrc:  Oral Oral Oral  SpO2: 96%  97% 95%  Weight:   50.3 kg   Height:   5\' 4"  (1.626 m)     Intake/Output Summary (Last 24 hours) at 05/04/2023 1101 Last data filed at 05/04/2023 0752 Gross per 24 hour  Intake 474 ml  Output 2000 ml  Net -1526 ml      05/04/2023    5:04 AM 05/04/2023    4:00 AM 04/02/2023   11:31 AM  Last 3 Weights  Weight (lbs) 110 lb 14.3 oz 111 lb 111 lb  Weight (kg) 50.3 kg 50.349 kg 50.349 kg     Body mass index is 19.03 kg/m.  General:  Well nourished, frail in no acute distress HEENT: normal Neck: supple Vascular: No carotid bruits; Distal pulses 2+ bilaterally Cardiac:  normal S1, S2; irregular Lungs:  clear to auscultation bilaterally, no wheezing, rhonchi or rales  Abd: soft, nontender, no hepatomegaly  Ext: Trace to 1+ bilateral lower extremity edema.  Ecchymosis noted. Musculoskeletal:  No deformities, BUE and BLE strength normal and equal Skin: warm and dry  Neuro:  CNs 2-12 intact, no focal abnormalities noted Psych:  Normal affect   EKG:  The EKG was personally reviewed and demonstrates: Atrial flutter, RV conduction delay. Telemetry:  Telemetry was personally reviewed and demonstrates: Atrial  flutter Laboratory Data:  High Sensitivity Troponin:   Recent Labs  Lab 05/03/23 2225 05/04/23 0030  TROPONINIHS 22* 23*     Chemistry Recent Labs  Lab 05/03/23 2225 05/04/23 0842  NA 137 136  K 5.0 4.3  CL 102 100  CO2 24 26  GLUCOSE 164* 153*  BUN 23 21  CREATININE 1.66* 1.47*  CALCIUM 8.5* 8.7*  MG  --  1.9  GFRNONAA 30* 35*  ANIONGAP 11 10   Hematology Recent Labs  Lab 05/03/23 2225 05/04/23 0842  WBC 5.7 6.2  RBC 3.47* 3.53*  HGB 11.7* 12.0  HCT 37.6 38.2  MCV 108.4* 108.2*  MCH 33.7 34.0  MCHC 31.1 31.4  RDW 13.0 13.1  PLT  207 192    BNP Recent Labs  Lab 05/03/23 2225  BNP 638.3*      Radiology/Studies:  DG Chest 2 View  Result Date: 05/03/2023 CLINICAL DATA:  Shortness of breath with lower extremity edema EXAM: CHEST - 2 VIEW COMPARISON:  Radiograph 07/04/2021 FINDINGS: Stable enlargement of the cardiomediastinal silhouette. Aortic atherosclerotic calcification. Left chest wall pacemaker. Large hiatal hernia containing intrathoracic stomach and colon. Elevated left hemidiaphragm. Left greater than right basilar atelectasis. No definite pleural effusion no pneumothorax. IMPRESSION: 1. Large hiatal hernia with intrathoracic stomach and colon. 2. Bibasilar atelectasis. 3. Cardiomegaly. Electronically Signed   By: Minerva Fester M.D.   On: 05/03/2023 22:40     Assessment and Plan:   Recurrent atypical atrial flutter-patient has developed recurrent atrial flutter likely causing her congestive heart failure symptoms.  Heart rate is upper normal to mildly increased.  Will increase metoprolol to 50 mg twice daily.  CHA2DS2-VASc is 5.  However anticoagulation issue is very difficult.  She has been taken off in the past as she has had frequent falls 1 of which resulted in a subarachnoid hemorrhage.  For now we will hold on anticoagulation.  If her heart rate is difficult to control or she remains symptomatic despite diuresis and rate control may need to  consider short-term anticoagulation followed by TEE guided cardioversion.  Could then discontinue anticoagulation 4 weeks after DCCV.  I have discussed this with patient and daughter and they also understand the high risk of embolic event off of anticoagulation but I feel risk outweighs benefit at this point.  Will plan to repeat echocardiogram.  Check TSH.  It should be noted amiodarone was discontinued in the past as it was felt it could be contributing to lung abnormalities.  However that may have been artifactual due to large diaphragmatic hernia/compression atelectasis.  Can consider amiodarone in the future if necessary. Acute diastolic congestive heart failure-likely secondary to atrial flutter.  Agree with gentle diuresis and follow renal function.  Await echocardiogram to assess LV function. Acute kidney injury-creatinine is up compared to baseline in 2022.  Some improvement with follow-up laboratories today.  Will continue to follow closely with diuresis. History of pacemaker Hypertension-continue present medications and adjust based on follow-up readings. Hyperlipidemia-continue statin.   Risk Assessment/Risk Scores:    New York Heart Association (NYHA) Functional Class NYHA Class III  CHA2DS2-VASc Score = 5  This indicates a 7.2% annual risk of stroke. The patient's score is based upon: CHF History: 1 HTN History: 1 Diabetes History: 0 Stroke History: 0 Vascular Disease History: 0 Age Score: 2 Gender Score: 1      For questions or updates, please contact [facility] Please consult [email or web address] for contact info under    Signed, Olga Millers, MD  05/04/2023 11:01 AM

## 2023-05-04 NOTE — Assessment & Plan Note (Signed)
-   We will continue Neurontin. ?

## 2023-05-04 NOTE — Assessment & Plan Note (Signed)
-   We will continue her Lopressor. 

## 2023-05-04 NOTE — Progress Notes (Signed)
Echocardiogram 2D Echocardiogram has been performed.  Anne Anthony 05/04/2023, 2:09 PM

## 2023-05-04 NOTE — ED Notes (Signed)
Patient arrived to room via wc a/o x 4 respirations even and non labored able to speak in full complete sentences. Patient reports swelling to lower legs and sob x 3 days has hx of afib and sick sinus. Patient seen by PCP this morning.

## 2023-05-04 NOTE — Assessment & Plan Note (Signed)
-   We will continue statin therapy. 

## 2023-05-04 NOTE — Progress Notes (Signed)
Ok to reduce dose gabapentin dose to a max of 300mg  BID due to renal function per Dr. Janee Morn.  Ulyses Southward, PharmD, BCIDP, AAHIVP, CPP Infectious Disease Pharmacist 05/04/2023 2:33 PM

## 2023-05-04 NOTE — Evaluation (Signed)
Physical Therapy Evaluation Patient Details Name: Anne Anthony MRN: [id number] DOB: 04-01-1936 Today's Date: 05/04/2023  History of Present Illness  87 yo female presents to [facility] hospital on 05/03/2023 with acute onset DOE and LE edema. Pt found to be in afib as well as acute congestive heart failure. PMH includes PAF, diverticulosis, SAH, HTN, HLD, tachybradycardia syndrome s/p PPM placement.  Clinical Impression  Pt presents to PT with deficits in activity tolerance, gait, balance, power. Pt denies DOE during session, appears more limited by pain with R shin wound than cardiopulmonary endurance. Pt is tachycardic up to 135 in afib, however SpO2 remains stable in mid 90s. Pt is encouraged to ambulate frequently during admission in an effort to maintain independence. PT recommends use of rollator at the time of discharge to improve stability while RLE wound heals.       Recommendations for follow up therapy are one component of a multi-disciplinary discharge planning process, led by the attending physician.  Recommendations may be updated based on patient status, additional functional criteria and insurance authorization.  Follow Up Recommendations       Assistance Recommended at Discharge PRN  Patient can return home with the following  A little help with bathing/dressing/bathroom;Assistance with cooking/housework;Assist for transportation;Help with stairs or ramp for entrance    Equipment Recommendations None recommended by PT  Recommendations for Other Services       Functional Status Assessment Patient has had a recent decline in their functional status and demonstrates the ability to make significant improvements in function in a reasonable and predictable amount of time.     Precautions / Restrictions Precautions Precautions: Fall Restrictions Weight Bearing Restrictions: No      Mobility  Bed Mobility                    Transfers Overall transfer level: Needs  assistance Equipment used: None Transfers: Sit to/from Stand Sit to Stand: Supervision                Ambulation/Gait Ambulation/Gait assistance: Min guard Gait Distance (Feet): 150 Feet Assistive device:  (PRN railing) Gait Pattern/deviations: Drifts right/left Gait velocity: reduced Gait velocity interpretation: <1.8 ft/sec, indicate of risk for recurrent falls   General Gait Details: pt with slowed step-through gait, pt tends to drift laterally when without UE support, reduced stance time on RLE due to pain  Stairs            Wheelchair Mobility    Modified Rankin (Stroke Patients Only)       Balance Overall balance assessment: Needs assistance Sitting-balance support: No upper extremity supported, Feet supported Sitting balance-Leahy Scale: Good     Standing balance support: No upper extremity supported, During functional activity Standing balance-Leahy Scale: Fair                               Pertinent Vitals/Pain Pain Assessment Pain Assessment: Faces Faces Pain Scale: Hurts little more Pain Location: wound on R shin Pain Descriptors / Indicators: Sore Pain Intervention(s): Monitored during session    Home Living Family/patient expects to be discharged to:: Private residence Living Arrangements: Alone Available Help at Discharge: Family;Available PRN/intermittently Type of Home: House Home Access: Level entry       Home Layout: One level Home Equipment: Agricultural consultant (2 wheels);Rollator (4 wheels);BSC/3in1;Shower seat      Prior Function Prior Level of Function : Independent/Modified Independent  Mobility Comments: ambulatory without DME, no falls in the last year       Hand Dominance        Extremity/Trunk Assessment   Upper Extremity Assessment Upper Extremity Assessment: Overall WFL for tasks assessed    Lower Extremity Assessment Lower Extremity Assessment: Generalized weakness    Cervical  / Trunk Assessment Cervical / Trunk Assessment: Kyphotic  Communication   Communication: No difficulties  Cognition Arousal/Alertness: Awake/alert Behavior During Therapy: WFL for tasks assessed/performed Overall Cognitive Status: Within Functional Limits for tasks assessed                                          General Comments General comments (skin integrity, edema, etc.): pt in afib, HR ranging from 101-135 during session. Sats stable on room air.  RN redressing R shin wound during session    Exercises     Assessment/Plan    PT Assessment Patient needs continued PT services  PT Problem List Decreased strength;Decreased activity tolerance;Decreased balance;Decreased mobility;Decreased knowledge of use of DME       PT Treatment Interventions DME instruction;Gait training;Functional mobility training;Therapeutic activities;Therapeutic exercise;Balance training;Neuromuscular re-education;Patient/family education;Stair training    PT Goals (Current goals can be found in the Care Plan section)  Acute Rehab PT Goals Patient Stated Goal: to return home to her dog PT Goal Formulation: With patient Time For Goal Achievement: 05/18/23 Potential to Achieve Goals: Good Additional Goals Additional Goal #1: Pt will score >19/24 on the DGI to indicate a reduced risk for falls    Frequency Min 2X/week     Co-evaluation               AM-PAC PT "6 Clicks" Mobility  Outcome Measure Help needed turning from your back to your side while in a flat bed without using bedrails?: None Help needed moving from lying on your back to sitting on the side of a flat bed without using bedrails?: None Help needed moving to and from a bed to a chair (including a wheelchair)?: A Little Help needed standing up from a chair using your arms (e.g., wheelchair or bedside chair)?: A Little Help needed to walk in hospital room?: A Little Help needed climbing 3-5 steps with a railing? :  A Little 6 Click Score: 20    End of Session   Activity Tolerance: Patient tolerated treatment well Patient left: in bed Nurse Communication: Mobility status PT Visit Diagnosis: Other abnormalities of gait and mobility (R26.89);Muscle weakness (generalized) (M62.81)    Time: 1610-9604 PT Time Calculation (min) (ACUTE ONLY): 17 min   Charges:   PT Evaluation $PT Eval Low Complexity: 1 Low          Arlyss Gandy, PT, DPT Acute Rehabilitation Office [phone]   Arlyss Gandy 05/04/2023, 4:52 PM

## 2023-05-04 NOTE — Progress Notes (Signed)
PROGRESS NOTE    Anne Anthony  ZOX:[id number] DOB: 03-02-1936 DOA: 05/03/2023 PCP: [name], MD    Chief Complaint  Patient presents with   Shortness of Breath    Lower Legs/Feet Edema    Brief Narrative: Patient is a pleasant 87 year old female history of paroxysmal A-fib/atrial flutter, diverticulosis, history of subarachnoid hemorrhage, hypertension, dyslipidemia, tachybradycardia syndrome status post PPM presented to the ED with acute onset of worsening shortness of breath on exertion, lower extremity edema x 4 to 5 days, orthopnea.  Patient seen in the ED noted to have an elevated BNP of 638.3, high-sensitivity troponin 22 and 23, EKG with atrial flutter with variable block with a rate of 98, chest x-ray bibasilar atelectasis cardiomegaly large hiatal hernia.  Patient admitted for new onset acute CHF exacerbation in the setting of A-fib as well as AKI.   Assessment & Plan:   Principal Problem:   New onset of congestive heart failure (HCC) Active Problems:   Paroxysmal atrial fibrillation with RVR (HCC)   Acute kidney injury superimposed on chronic kidney disease (HCC)   GERD without esophagitis   Essential hypertension   Dyslipidemia   Depression   Peripheral neuropathy   AKI (acute kidney injury) (HCC)  #1 new onset CHF -Likely secondary to paroxysmal atrial fibrillation/atrial flutter as patient states has noted palpitations and fluttering whenever she lays down and goes to bed recently. -BNP noted to be elevated at 638.3 on admission. -High-sensitivity troponin of 22> 23. -2D echo pending. -Patient with clinical improvement after being placed on diuretics. -Urine output of 1.4 L since admission. -Continue Lasix 40 mg IV every 12 hours, aspirin, Lopressor, Zocor. -Cardiology consulted for further evaluation and management.  2.  Paroxysmal atrial fibrillation/atrial flutter -Patient noted to be in atrial flutter on admission with heart rates in the high  90s. -Heart rate in the low 100s this morning at rest. -Patient presented with new onset CHF could likely be secondary to A-fib/a flutter. -Patient states has been having palpitations whenever she goes to bed lately. -Continue Lopressor for rate control may need to uptitrate however will defer to cardiology. -Patient with history of subarachnoid hemorrhage and as such not a anticoagulation candidate and will continue aspirin. -Cardiology consultation pending.  3.  Hyperlipidemia -Continue statin.  4.  AKI on CKD stage IIIa -Creatinine on presentation 1.66, last creatinine noted of 1.17 (07/04/2021) -Likely secondary to prerenal azotemia secondary to acute CHF exacerbation. -Patient on IV Lasix with good urine output. -Renal function trending down creatinine currently at 1.47 today. -Continue IV diuretics. -Avoid nephrotoxins.  5.  GERD/large hiatal hernia with intrathoracic stomach and colon (x-ray 05/03/2023) -Patient currently asymptomatic. -Increase PPI to twice daily. -Outpatient follow-up.  6.  Depression -Stable. -Continue home regimen Lexapro.  7.  Peripheral neuropathy -Neurontin.  8.  Hypertension -Continue Lopressor.    DVT prophylaxis: SCDs Code Status: Full Family Communication: Updated patient and daughter at bedside. Disposition: Likely back home pending PT evaluation and when cleared by cardiology.  Status is: Observation The patient remains OBS appropriate and will d/c before 2 midnights.   Consultants:  Cardiology  Procedures:  2D echo pending Chest x-ray 05/03/2023   Antimicrobials:  Anti-infectives (From admission, onward)    Start     Dose/Rate Route Frequency Ordered Stop   05/04/23 2200  trimethoprim (TRIMPEX) tablet 100 mg        100 mg Oral Daily at bedtime 05/04/23 0430           Subjective: Laying  in bed.  States had good urine output since admission and peed so much could have washed the hospital with it.  Denies any chest pain.   No abdominal pain.  Does endorse some palpitations when she goes to bed occasionally per patient.  Daughter at bedside.  Objective: Vitals:   05/04/23 0430 05/04/23 0446 05/04/23 0504 05/04/23 0751  BP: (!) 135/97  (!) 143/97 115/73  Pulse: (!) 130  (!) 132 (!) 45  Resp: 20  18 16   Temp:  99.2 F (37.3 C) 98 F (36.7 C) 97.9 F (36.6 C)  TempSrc:  Oral Oral Oral  SpO2: 96%  97% 95%  Weight:   50.3 kg   Height:   5\' 4"  (1.626 m)     Intake/Output Summary (Last 24 hours) at 05/04/2023 1044 Last data filed at 05/04/2023 0752 Gross per 24 hour  Intake 474 ml  Output 2000 ml  Net -1526 ml   Filed Weights   05/04/23 0400 05/04/23 0504  Weight: 50.3 kg 50.3 kg    Examination:  General exam: Appears calm and comfortable  Respiratory system: Bibasilar crackles.  No wheezing.  Fair air movement.  Speaking in full sentences.  Cardiovascular system: Irregularly irregular.  Positive JVD.  Trace to 1+ bilateral lower extremity edema.  Gastrointestinal system: Abdomen is nondistended, soft and nontender. No organomegaly or masses felt. Normal bowel sounds heard. Central nervous system: Alert and oriented. No focal neurological deficits. Extremities: Symmetric 5 x 5 power. Skin: No rashes, lesions or ulcers Psychiatry: Judgement and insight appear normal. Mood & affect appropriate.     Data Reviewed: I have personally reviewed following labs and imaging studies  CBC: Recent Labs  Lab 05/03/23 2225 05/04/23 0842  WBC 5.7 6.2  HGB 11.7* 12.0  HCT 37.6 38.2  MCV 108.4* 108.2*  PLT 207 192    Basic Metabolic Panel: Recent Labs  Lab 05/03/23 2225 05/04/23 0842  NA 137 136  K 5.0 4.3  CL 102 100  CO2 24 26  GLUCOSE 164* 153*  BUN 23 21  CREATININE 1.66* 1.47*  CALCIUM 8.5* 8.7*  MG  --  1.9    GFR: Estimated Creatinine Clearance: 22.2 mL/min (A) (by C-G formula based on SCr of 1.47 mg/dL (H)).  Liver Function Tests: No results for input(s): "AST", "ALT",  "ALKPHOS", "BILITOT", "PROT", "ALBUMIN" in the last 168 hours.  CBG: No results for input(s): "GLUCAP" in the last 168 hours.   No results found for this or any previous visit (from the past 240 hour(s)).       Radiology Studies: DG Chest 2 View  Result Date: 05/03/2023 CLINICAL DATA:  Shortness of breath with lower extremity edema EXAM: CHEST - 2 VIEW COMPARISON:  Radiograph 07/04/2021 FINDINGS: Stable enlargement of the cardiomediastinal silhouette. Aortic atherosclerotic calcification. Left chest wall pacemaker. Large hiatal hernia containing intrathoracic stomach and colon. Elevated left hemidiaphragm. Left greater than right basilar atelectasis. No definite pleural effusion no pneumothorax. IMPRESSION: 1. Large hiatal hernia with intrathoracic stomach and colon. 2. Bibasilar atelectasis. 3. Cardiomegaly. Electronically Signed   By: Minerva Fester M.D.   On: 05/03/2023 22:40        Scheduled Meds:  aspirin EC  81 mg Oral QHS   enoxaparin (LOVENOX) injection  30 mg Subcutaneous Q24H   escitalopram  5 mg Oral Daily   furosemide  40 mg Intravenous Q12H   gabapentin  1,200 mg Oral BID   latanoprost  1 drop Both Eyes QHS   metoprolol tartrate  25 mg Oral BID   pantoprazole  40 mg Oral BID AC   simvastatin  20 mg Oral QPM   trimethoprim  100 mg Oral QHS   Continuous Infusions:   LOS: 0 days    Time spent: 40 minutes  No charge.    Ramiro Harvest, MD [facility]   To contact the attending provider between 7A-7P or the covering provider during after hours 7P-7A, please log into the web site [email or web address] and access using universal [place] password for that web site. If you do not have the password, please call the hospital operator.  05/04/2023, 10:44 AM

## 2023-05-05 ENCOUNTER — Encounter (HOSPITAL_COMMUNITY): Payer: Self-pay | Admitting: Family Medicine

## 2023-05-05 ENCOUNTER — Inpatient Hospital Stay (HOSPITAL_COMMUNITY): Payer: Medicare PPO

## 2023-05-05 DIAGNOSIS — I480 Paroxysmal atrial fibrillation: Secondary | ICD-10-CM | POA: Diagnosis not present

## 2023-05-05 DIAGNOSIS — I509 Heart failure, unspecified: Secondary | ICD-10-CM | POA: Diagnosis not present

## 2023-05-05 DIAGNOSIS — I10 Essential (primary) hypertension: Secondary | ICD-10-CM | POA: Diagnosis not present

## 2023-05-05 DIAGNOSIS — N179 Acute kidney failure, unspecified: Secondary | ICD-10-CM | POA: Diagnosis not present

## 2023-05-05 LAB — BASIC METABOLIC PANEL
Anion gap: 14 (ref 5–15)
BUN: 26 mg/dL — ABNORMAL HIGH (ref 8–23)
CO2: 28 mmol/L (ref 22–32)
Calcium: 8.7 mg/dL — ABNORMAL LOW (ref 8.9–10.3)
Chloride: 95 mmol/L — ABNORMAL LOW (ref 98–111)
Creatinine, Ser: 1.88 mg/dL — ABNORMAL HIGH (ref 0.44–1.00)
GFR, Estimated: 26 mL/min — ABNORMAL LOW (ref >60)
Glucose, Bld: 117 mg/dL — ABNORMAL HIGH (ref 70–99)
Potassium: 3.9 mmol/L (ref 3.5–5.1)
Sodium: 137 mmol/L (ref 135–145)

## 2023-05-05 LAB — TSH: TSH: 0.691 u[IU]/mL (ref 0.350–4.500)

## 2023-05-05 MED ORDER — FUROSEMIDE 40 MG PO TABS
40.0000 mg | ORAL_TABLET | Freq: Every day | ORAL | Status: DC
  Administered 2023-05-06: 40 mg via ORAL
  Filled 2023-05-05 (×2): qty 1

## 2023-05-05 MED ORDER — APIXABAN 2.5 MG PO TABS
2.5000 mg | ORAL_TABLET | Freq: Two times a day (BID) | ORAL | Status: DC
  Administered 2023-05-05 – 2023-05-06 (×3): 2.5 mg via ORAL
  Filled 2023-05-05 (×3): qty 1

## 2023-05-05 NOTE — Progress Notes (Signed)
Rounding Note    Patient Name: Anne Anthony Date of Encounter: 05/05/2023  [facility] Cardiologist: Thurmon Fair, MD   Subjective   Feeling much better.  Good diuresis and edema and dyspnea have resolved. Net diuresis 3.4 L, weight down 4 pounds. Slight bump in creatinine. Interrogation of her pacemaker shows that she has had a 3-day episode of paroxysmal atrial fibrillation around May 30, now with another episode of persistent atrial fibrillation going on for over 5 days.  Inpatient Medications    Scheduled Meds:  aspirin EC  81 mg Oral QHS   enoxaparin (LOVENOX) injection  30 mg Subcutaneous Q24H   escitalopram  5 mg Oral Daily   furosemide  40 mg Intravenous Q12H   gabapentin  300 mg Oral BID   latanoprost  1 drop Both Eyes QHS   metoprolol tartrate  50 mg Oral BID   pantoprazole  40 mg Oral BID AC   simvastatin  20 mg Oral QPM   trimethoprim  100 mg Oral QHS   Continuous Infusions:  PRN Meds: acetaminophen **OR** acetaminophen, magnesium hydroxide, meclizine, ondansetron **OR** ondansetron (ZOFRAN) IV, traZODone   Vital Signs    Vitals:   05/04/23 2337 05/05/23 0225 05/05/23 0557 05/05/23 0755  BP: 111/60 (!) 105/56  (!) 121/57  Pulse: 94 84  80  Resp: 18 18  18   Temp: 97.9 F (36.6 C) 98.3 F (36.8 C)  (!) 97.3 F (36.3 C)  TempSrc: Oral Oral  Oral  SpO2: 96% 99%  95%  Weight:   48.5 kg   Height:        Intake/Output Summary (Last 24 hours) at 05/05/2023 1006 Last data filed at 05/05/2023 2956 Gross per 24 hour  Intake 1000 ml  Output 3300 ml  Net -2300 ml      05/05/2023    5:57 AM 05/04/2023    5:04 AM 05/04/2023    4:00 AM  Last 3 Weights  Weight (lbs) 106 lb 14.8 oz 110 lb 14.3 oz 111 lb  Weight (kg) 48.5 kg 50.3 kg 50.349 kg      Telemetry    Atrial fibrillation with controlled ventricular response, occasional ventricular pacing- Personally Reviewed  ECG    No new tracing- Personally Reviewed  Physical Exam  Elderly  and frail GEN: No acute distress.   Neck: No JVD Cardiac: irregular, 1/6 systolic ejection murmur, no diastolic murmurs, rubs, or gallops.  Respiratory: Clear to auscultation bilaterally. GI: Soft, nontender, non-distended  MS: No edema; No deformity. Neuro:  Nonfocal  Psych: Normal affect   Labs    High Sensitivity Troponin:   Recent Labs  Lab 05/03/23 2225 05/04/23 0030  TROPONINIHS 22* 23*     Chemistry Recent Labs  Lab 05/03/23 2225 05/04/23 0842 05/05/23 0125  NA 137 136 137  K 5.0 4.3 3.9  CL 102 100 95*  CO2 24 26 28   GLUCOSE 164* 153* 117*  BUN 23 21 26*  CREATININE 1.66* 1.47* 1.88*  CALCIUM 8.5* 8.7* 8.7*  MG  --  1.9  --   GFRNONAA 30* 35* 26*  ANIONGAP 11 10 14     Lipids No results for input(s): "CHOL", "TRIG", "HDL", "LABVLDL", "LDLCALC", "CHOLHDL" in the last 168 hours.  Hematology Recent Labs  Lab 05/03/23 2225 05/04/23 0842  WBC 5.7 6.2  RBC 3.47* 3.53*  HGB 11.7* 12.0  HCT 37.6 38.2  MCV 108.4* 108.2*  MCH 33.7 34.0  MCHC 31.1 31.4  RDW 13.0 13.1  PLT  207 192   Thyroid  Recent Labs  Lab 05/05/23 0125  TSH 0.691    BNP Recent Labs  Lab 05/03/23 2225  BNP 638.3*    DDimer No results for input(s): "DDIMER" in the last 168 hours.   Radiology    US RENAL  Result Date: 05/05/2023 CLINICAL DATA:  Acute renal failure EXAM: RENAL / URINARY TRACT ULTRASOUND COMPLETE COMPARISON:  None Available. FINDINGS: Right Kidney: Renal measurements: 7.1 x 3.3 x 4.0 cm = volume: 47 mL. Atrophic. Echogenicity within normal limits. No mass or hydronephrosis visualized. Left Kidney: Renal measurements: 7.1 x 3.7 x 4.2 cm = volume: 50 mL. Atrophic echogenicity within normal limits. No mass or hydronephrosis visualized. Bladder: Appears normal for degree of bladder distention. Other: None. IMPRESSION: Atrophic kidneys.  No hydronephrosis. Electronically Signed   By: Jearld Lesch M.D.   On: 05/05/2023 09:36   ECHOCARDIOGRAM COMPLETE  Result Date:  05/04/2023    ECHOCARDIOGRAM REPORT   Patient Name:   Anne Anthony Date of Exam: 05/04/2023 Medical Rec #:  [id number]       Height:       64.0 in Accession #:    [id number]      Weight:       110.9 lb Date of Birth:  [birth year]       BSA:          1.523 m Patient Age:    87 years        BP:           122/82 mmHg Patient Gender: F               HR:           57 bpm. Exam Location:  Inpatient Procedure: 2D Echo, Cardiac Doppler, Color Doppler and Intracardiac            Opacification Agent Indications:    A Flutter I48.92  History:        Patient has no prior history of Echocardiogram examinations.                 CHF, Pacemaker, Arrythmias:Atrial Fibrillation and Tachycardia;                 Risk Factors:Hypertension and Dyslipidemia.  Sonographer:    Lucendia Herrlich Referring Phys: 61 BRIAN S CRENSHAW  Sonographer Comments: Technically difficult study due to poor echo windows. IMPRESSIONS  1. Left ventricular ejection fraction, by estimation, is 50%. The left ventricle has low normal function. The left ventricle has no regional wall motion abnormalities. Left ventricular diastolic parameters are indeterminate.  2. Right ventricular systolic function is mildly reduced. The right ventricular size is moderately enlarged. There is normal pulmonary artery systolic pressure.  3. Left atrial size was moderately dilated.  4. Right atrial size was moderately dilated.  5. The mitral valve is degenerative. Trivial mitral valve regurgitation. No evidence of mitral stenosis.  6. The aortic valve is grossly normal. Aortic valve regurgitation is not visualized. No aortic stenosis is present.  7. The inferior vena cava is normal in size with greater than 50% respiratory variability, suggesting right atrial pressure of 3 mmHg. FINDINGS  Left Ventricle: Left ventricular ejection fraction, by estimation, is 50%. The left ventricle has low normal function. The left ventricle has no regional wall motion abnormalities. Definity  contrast agent was given IV to delineate the left ventricular endocardial borders. The left ventricular internal cavity size was normal in size. Suboptimal image quality limits for  assessment of left ventricular hypertrophy. Left ventricular diastolic parameters are indeterminate. Right Ventricle: The right ventricular size is moderately enlarged. No increase in right ventricular wall thickness. Right ventricular systolic function is mildly reduced. There is normal pulmonary artery systolic pressure. The tricuspid regurgitant velocity is 2.36 m/s, and with an assumed right atrial pressure of 3 mmHg, the estimated right ventricular systolic pressure is 25.3 mmHg. Left Atrium: Left atrial size was moderately dilated. Right Atrium: Right atrial size was moderately dilated. Pericardium: There is no evidence of pericardial effusion. Mitral Valve: The mitral valve is degenerative in appearance. Trivial mitral valve regurgitation. No evidence of mitral valve stenosis. Tricuspid Valve: The tricuspid valve is grossly normal. Tricuspid valve regurgitation is mild . No evidence of tricuspid stenosis. Aortic Valve: The aortic valve is grossly normal. Aortic valve regurgitation is not visualized. No aortic stenosis is present. Aortic valve peak gradient measures 2.9 mmHg. Pulmonic Valve: The pulmonic valve was grossly normal. Pulmonic valve regurgitation is trivial. No evidence of pulmonic stenosis. Aorta: The aortic root is normal in size and structure. Venous: The inferior vena cava is normal in size with greater than 50% respiratory variability, suggesting right atrial pressure of 3 mmHg. IAS/Shunts: No atrial level shunt detected by color flow Doppler. Additional Comments: A device lead is visualized in the right atrium and right ventricle.  LEFT VENTRICLE PLAX 2D LVOT diam:     2.00 cm   Diastology LV SV:         23        LV e' medial:    10.90 cm/s LV SV Index:   15        LV E/e' medial:  5.9 LVOT Area:     3.14 cm  LV  e' lateral:   13.70 cm/s                          LV E/e' lateral: 4.7  RIGHT VENTRICLE            IVC RV S prime:     5.59 cm/s  IVC diam: 1.90 cm TAPSE (M-mode): 1.3 cm LEFT ATRIUM             Index LA Vol (A2C):   38.7 ml 25.42 ml/m LA Vol (A4C):   37.8 ml 24.83 ml/m LA Biplane Vol: 39.8 ml 26.14 ml/m  AORTIC VALVE AV Area (Vmax): 1.68 cm AV Vmax:        84.50 cm/s AV Peak Grad:   2.9 mmHg LVOT Vmax:      45.13 cm/s LVOT Vmean:     28.133 cm/s LVOT VTI:       0.073 m  AORTA Ao Root diam: 2.80 cm Ao Asc diam:  2.60 cm MITRAL VALVE               TRICUSPID VALVE MV Area (PHT): 6.54 cm    TR Peak grad:   22.3 mmHg MV Decel Time: 116 msec    TR Vmax:        236.00 cm/s MR Peak grad: 18.7 mmHg MR Vmax:      216.00 cm/s  SHUNTS MV E velocity: 64.80 cm/s  Systemic VTI:  0.07 m                            Systemic Diam: 2.00 cm  Weston Brass MD Electronically signed by Weston Brass MD Signature Date/Time: 05/04/2023/4:54:39 PM  Final    DG Chest 2 View  Result Date: 05/03/2023 CLINICAL DATA:  Shortness of breath with lower extremity edema EXAM: CHEST - 2 VIEW COMPARISON:  Radiograph 07/04/2021 FINDINGS: Stable enlargement of the cardiomediastinal silhouette. Aortic atherosclerotic calcification. Left chest wall pacemaker. Large hiatal hernia containing intrathoracic stomach and colon. Elevated left hemidiaphragm. Left greater than right basilar atelectasis. No definite pleural effusion no pneumothorax. IMPRESSION: 1. Large hiatal hernia with intrathoracic stomach and colon. 2. Bibasilar atelectasis. 3. Cardiomegaly. Electronically Signed   By: Minerva Fester M.D.   On: 05/03/2023 22:40    Cardiac Studies   Pacemaker interrogation performed today shows normal device function.  In the last 6 months she only had atrial fibrillation starting on 04/12/2023 lasting for about 2 days and then again on 04/30/2023, ongoing.  Low burden of ventricular pacing.  The atrial fibrillation was virtually 100% atrial  paced, ventricular sensed rhythm.  Lead and generator parameters look normal.  Estimated generator longevity 29 months.  Patient Profile     87 y.o. female with longstanding history of paroxysmal atrial fibrillation, chronic diastolic heart failure, and normalized diaphragmatic hernia with virtually the entire stomach as well as colon herniated into the chest and secondary restrictive ventilation defect, admitted with acute on chronic diastolic heart failure and persistent atrial fibrillation with rapid ventricular response  Assessment & Plan    Excellent response to diuresis.  Agree with switching to oral diuretics. Current dose of beta-blocker is providing excellent rate control. She is not a candidate for transesophageal echocardiogram.  We will not be able to image the heart from the esophagus due to the enormous diaphragmatic hernia. Recommend 3 weeks of oral anticoagulation with Eliquis 2.5 mg twice daily (dose adjusted for age and small body habitus) prior to DC cardioversion, followed by another 4 weeks of oral anticoagulation. Unfortunately she has frequent falls and she is not a good candidate for long-term anticoagulation.  In the past she has had a very low burden of atrial fibrillation.  Hopefully will be able to stop her anticoagulant again.  I do not think she is a candidate for a Watchman device: we will not be able to monitor the procedure with TEE.     For questions or updates, please contact [facility] Please consult [email or web address] for contact info under        Signed, Thurmon Fair, MD  05/05/2023, 10:06 AM

## 2023-05-05 NOTE — Plan of Care (Addendum)
Patient remains in Cardiac PCU. Upon initial exam, patient is sitting upright on the side of bed and eating food provided by family (including pizza, soda, and a 3 [employer] candy bar). Health food choices are encouraged by this RN and reinforced with family. No supplemental O2. Patient denies CP, SHOB.   Problem: Education: Goal: Ability to demonstrate management of disease process will improve Outcome: Progressing Goal: Ability to verbalize understanding of medication therapies will improve Outcome: Progressing Goal: Individualized Educational Video(s) Outcome: Progressing   Problem: Activity: Goal: Capacity to carry out activities will improve Outcome: Progressing   Problem: Cardiac: Goal: Ability to achieve and maintain adequate cardiopulmonary perfusion will improve Outcome: Progressing   Problem: Education: Goal: Knowledge of General Education information will improve Description: Including pain rating scale, medication(s)/side effects and non-pharmacologic comfort measures Outcome: Progressing   Problem: Health Behavior/Discharge Planning: Goal: Ability to manage health-related needs will improve Outcome: Progressing   Problem: Clinical Measurements: Goal: Ability to maintain clinical measurements within normal limits will improve Outcome: Progressing Goal: Will remain free from infection Outcome: Progressing Goal: Diagnostic test results will improve Outcome: Progressing Goal: Respiratory complications will improve Outcome: Progressing Goal: Cardiovascular complication will be avoided Outcome: Progressing   Problem: Activity: Goal: Risk for activity intolerance will decrease Outcome: Progressing   Problem: Nutrition: Goal: Adequate nutrition will be maintained Outcome: Progressing   Problem: Coping: Goal: Level of anxiety will decrease Outcome: Progressing   Problem: Elimination: Goal: Will not experience complications related to bowel motility Outcome:  Progressing Goal: Will not experience complications related to urinary retention Outcome: Progressing   Problem: Pain Managment: Goal: General experience of comfort will improve Outcome: Progressing   Problem: Safety: Goal: Ability to remain free from injury will improve Outcome: Progressing   Problem: Skin Integrity: Goal: Risk for impaired skin integrity will decrease Outcome: Progressing

## 2023-05-05 NOTE — Consult Note (Signed)
WOC Nurse Consult Note: Reason for Consult:Right LE skin tear Wound type:trauma in the presence of edema Pressure Injury POA: [name] Measurement:To be obtained by Bedside RN and documented on the Nursing Flow Sheet Drainage (amount, consistency, odor) small serous Periwound:edematous Dressing procedure/placement/frequency:I have provided Nursing with guidance in the care of this lesion using a daily cleanse followed by placement of an antimicrobial nonadherent (xeroform) secured with dry gauze/ABD pad/Kerlix roll gauze/paper tape. A sacral foam, turning and repositioning to minimize time in the supine position and provision of both bilateral heel pressure redistribution boots and a pressure redistribution chair cushion for OOB use.   WOC nursing team will not follow, but will remain available to this patient, the nursing and medical teams.  Please re-consult if needed.  Thank you for inviting [place] to participate in this patient's Plan of Care.  Ladona Mow, MSN, RN, CNS, GNP, Leda Min, [employer], [employer] phone:  [phone]

## 2023-05-05 NOTE — Evaluation (Signed)
Occupational Therapy Evaluation Patient Details Name: Anne Anthony MRN: [id number] DOB: 15-Jan-1936 Today's Date: 05/05/2023   History of Present Illness 87 yo female presents [facility] hospital on 05/03/2023 with acute onset DOE and LE edema. Pt found to be in afib as well as acute congestive heart failure. PMH includes PAF, diverticulosis, SAH, HTN, HLD, tachybradycardia syndrome s/p PPM placement.   Clinical Impression   Pt admitted for above dx, PTA patient lived alone and reports ambulating no AD and ind in bADLs/iADLs. Pt currently presenting with new onset of CHF and RLE soreness. Pt ambulating well with min guard + RW, educated pt and family on CHF signs/precautions and dietary restrictions, but pt with poor carryover of education although handout was provided to help with carryover. Pt has a watch alert symptoms to alarm family in times of need. Pt would benefit from continued acute skilled OT services to progress functional mobility and promote CHF education if pt is to DC home alone. Patient would benefit from post acute Home OT services to help maximize functional independence in natural environment and promote likelihood of CHF education retention.     Recommendations for follow up therapy are one component of a multi-disciplinary discharge planning process, led by the attending physician.  Recommendations may be updated based on patient status, additional functional criteria and insurance authorization.   Assistance Recommended at Discharge Intermittent Supervision/Assistance  Patient can return home with the following Direct supervision/assist for medications management;Assistance with cooking/housework    Functional Status Assessment  Patient has had a recent decline in their functional status and demonstrates the ability to make significant improvements in function in a reasonable and predictable amount of time.  Equipment Recommendations  None recommended by OT (pt has rec DME)     Recommendations for Other Services       Precautions / Restrictions Precautions Precautions: Fall Restrictions Weight Bearing Restrictions: No      Mobility Bed Mobility Overal bed mobility: Needs Assistance Bed Mobility: Supine to Sit     Supine to sit: Supervision     General bed mobility comments: Pt left sitting in recliner, BLEs elevated    Transfers Overall transfer level: Needs assistance Equipment used: Rolling walker (2 wheels) Transfers: Sit to/from Stand Sit to Stand: Supervision                  Balance Overall balance assessment: Needs assistance Sitting-balance support: No upper extremity supported, Feet supported Sitting balance-Leahy Scale: Good     Standing balance support: No upper extremity supported, During functional activity Standing balance-Leahy Scale: Fair Standing balance comment: Does not need RW to ambulate short distances                           ADL either performed or assessed with clinical judgement   ADL Overall ADL's : Needs assistance/impaired Eating/Feeding: Independent;Sitting   Grooming: Standing;Min guard   Upper Body Bathing: Sitting;Supervision/ safety;Set up   Lower Body Bathing: Sitting/lateral leans;Min guard   Upper Body Dressing : Sitting;Supervision/safety;Set up   Lower Body Dressing: Sitting/lateral leans;Min guard   Toilet Transfer: Min guard;Rolling walker (2 wheels)   Toileting- Clothing Manipulation and Hygiene: Sit to/from stand;Min guard   Tub/ Shower Transfer: Min guard   Functional mobility during ADLs: Min guard;Rolling walker (2 wheels) General ADL Comments: Pt ambulated 150 ft in hall with OT, reports fatigue at end of session. Edcuated pt on CHF signs and precautions "living better" booklet, pt  verbalized understaning but needs reinforcement     Vision         Perception     Praxis      Pertinent Vitals/Pain Pain Assessment Pain Assessment: Faces Faces Pain  Scale: Hurts little more Pain Location: R shin/leg Pain Descriptors / Indicators: Sore Pain Intervention(s): Monitored during session, Repositioned, Patient requesting pain meds-RN notified     Hand Dominance Right   Extremity/Trunk Assessment Upper Extremity Assessment Upper Extremity Assessment: Overall WFL for tasks assessed   Lower Extremity Assessment Lower Extremity Assessment: Generalized weakness   Cervical / Trunk Assessment Cervical / Trunk Assessment: Kyphotic   Communication Communication Communication: No difficulties   Cognition Arousal/Alertness: Awake/alert Behavior During Therapy: WFL for tasks assessed/performed Overall Cognitive Status: Within Functional Limits for tasks assessed                                       General Comments  VSS on RA    Exercises     Shoulder Instructions      Home Living Family/patient expects to be discharged to:: Private residence Living Arrangements: Alone Available Help at Discharge: Family;Available PRN/intermittently Type of Home: House Home Access: Level entry     Home Layout: One level     Bathroom Shower/Tub: Producer, television/film/video: Standard     Home Equipment: Agricultural consultant (2 wheels);Rollator (4 wheels);BSC/3in1;Shower seat;Grab bars - tub/shower;Grab bars - toilet          Prior Functioning/Environment Prior Level of Function : Independent/Modified Independent             Mobility Comments: ambulatory without DME, no falls in the last year ADLs Comments: ind        OT Problem List: Cardiopulmonary status limiting activity;Decreased activity tolerance      OT Treatment/Interventions: Self-care/ADL training;Therapeutic activities;Energy conservation;Patient/family education;Balance training;Therapeutic exercise    OT Goals(Current goals can be found in the care plan section) Acute Rehab OT Goals Patient Stated Goal: To go home OT Goal Formulation: With  patient Time For Goal Achievement: 05/18/23 Potential to Achieve Goals: Good ADL Goals Pt Will Perform Grooming: standing;with supervision Pt Will Transfer to Toilet: ambulating;with supervision Additional ADL Goal #1: Pt will demonstrate understanding of CHF signs and precautions without cueing.  OT Frequency: Min 2X/week    Co-evaluation              AM-PAC OT "6 Clicks" Daily Activity     Outcome Measure Help from another person eating meals?: None Help from another person taking care of personal grooming?: A Little Help from another person toileting, which includes using toliet, bedpan, or urinal?: A Little Help from another person bathing (including washing, rinsing, drying)?: A Little Help from another person to put on and taking off regular upper body clothing?: A Little Help from another person to put on and taking off regular lower body clothing?: A Little 6 Click Score: 19   End of Session Equipment Utilized During Treatment: Gait belt;Rolling walker (2 wheels) Nurse Communication: Mobility status  Activity Tolerance: Patient tolerated treatment well Patient left: in chair;with call bell/phone within reach;with family/visitor present  OT Visit Diagnosis: Unsteadiness on feet (R26.81);Muscle weakness (generalized) (M62.81)                Time: 1610-9604 OT Time Calculation (min): 29 min Charges:  OT General Charges $OT Visit: 1 Visit OT Evaluation $OT Eval Moderate  Complexity: 1 Mod OT Treatments $Therapeutic Activity: 8-22 mins  05/05/2023  AB, OTR/L  Acute Rehabilitation Services  Office: [phone]   Tristan Schroeder 05/05/2023, 2:07 PM

## 2023-05-05 NOTE — Progress Notes (Signed)
PROGRESS NOTE    Anne Anthony  WUJ:[id number] DOB: 07-05-1936 DOA: 05/03/2023 PCP: [name], MD    Chief Complaint  Patient presents with   Shortness of Breath    Lower Legs/Feet Edema    Brief Narrative: Patient is a pleasant 87 year old female history of paroxysmal A-fib/atrial flutter, diverticulosis, history of subarachnoid hemorrhage, hypertension, dyslipidemia, tachybradycardia syndrome status post PPM presented to the ED with acute onset of worsening shortness of breath on exertion, lower extremity edema x 4 to 5 days, orthopnea.  Patient seen in the ED noted to have an elevated BNP of 638.3, high-sensitivity troponin 22 and 23, EKG with atrial flutter with variable block with a rate of 98, chest x-ray bibasilar atelectasis cardiomegaly large hiatal hernia.  Patient admitted for new onset acute CHF exacerbation in the setting of A-fib as well as AKI.   Assessment & Plan:   Principal Problem:   New onset of congestive heart failure (HCC) Active Problems:   Paroxysmal atrial fibrillation with RVR (HCC)   Acute kidney injury superimposed on chronic kidney disease (HCC)   GERD without esophagitis   Essential hypertension   Dyslipidemia   Depression   Peripheral neuropathy   AKI (acute kidney injury) (HCC)  #1 new onset acute diastolic CHF -Likely secondary to paroxysmal atrial fibrillation/atrial flutter as patient states has noted palpitations and fluttering whenever she lays down and goes to bed recently. -BNP noted to be elevated at 638.3 on admission. -High-sensitivity troponin of 22> 23. -2D echo with a EF of 50%, NWMA, mildly reduced right ventricular systolic function, right ventricular size moderately enlarged, normal pulmonary artery systolic pressure, moderately dilated left atrial size, moderately dilated right atrial size, degenerative mitral valve, trivial MVR. -Patient with clinical improvement with diuresis.  -Patient with improvement with shortness of  breath and significantly improved lower extremity edema.  -Patient with urine output of 3.450 L over the past 24 hours.  -Patient is -3.586 L during this hospitalization.   -Current weight of 106.92 pounds from 111 pounds on admission.  -Patient with a bump in creatinine and as such we will transition from IV Lasix to oral Lasix 40 mg daily.   -Continue aspirin, beta-blocker, statin.   -Cardiology following.    2.  Paroxysmal atrial fibrillation/atrial flutter -Patient noted to be in atrial flutter on admission with heart rates in the high 90s. -Heart rate improved with increased dose of metoprolol.  -Patient presented with new onset CHF could likely be secondary to A-fib/a flutter. -Patient states has been having palpitations whenever she goes to bed lately. -Patient seen by cardiology, and Lopressor dose increased for better rate control and currently at 50 mg twice daily.  -Pacemaker interrogation was performed today per cardiology which showed a normal device function, it is noted that patient only had A-fib starting 04/12/2023 lasting about 2 days and then again on 04/30/2023 and currently ongoing.  Per cardiology low burden of ventricular pacing and atrial fibrillation virtually 100% atrial paced, ventricular sensed rhythm.  -Cardiology feels patient is not a candidate for TEE due to a normal diaphragmatic hernia and recommending 3 weeks of oral anticoagulation with Eliquis 2.5 mg twice daily prior to DC cardioversion followed by another 4 weeks of oral anticoagulation.  -It is felt that patient is not a good long-term candidate for anticoagulation due to history of frequent falls.  -Per cardiology patient is not a candidate for Watchman device either.  -Continue current dose of Lopressor 50 mg twice daily.  -Patient started on  Eliquis per cardiology.  -Cardiology following and appreciate their input and recommendations.    3.  Hyperlipidemia -Statin.  4.  AKI on CKD stage  IIIa -Creatinine on presentation 1.66, last creatinine noted of 1.17 (07/04/2021) -Likely secondary to prerenal azotemia secondary to acute CHF exacerbation. -Patient on IV Lasix with good urine output of 3.450 L over the past 24 hours. -Renal function was trending back down however has trended back up today creatinine at 1.88.   -Discontinue IV Lasix and placed on oral Lasix 40 mg daily to start tomorrow 05/06/2023.   -Avoid nephrotoxins.   -Follow.  5.  GERD/large hiatal hernia with intrathoracic stomach and colon (x-ray 05/03/2023) -Patient currently asymptomatic. -Continue PPI twice daily.   -Outpatient follow-up.   6.  Depression -Stable. -Lexapro.  7.  Peripheral neuropathy -Neurontin.  8.  Hypertension -Continue Lopressor.     DVT prophylaxis: Eliquis Code Status: Full Family Communication: Updated patient and daughters at bedside. Disposition: Likely back home when clinically improved and cleared by cardiology hopefully in the next 1 to 2 days.   Status is: Inpatient.    Consultants:  Cardiology: Dr. Jens Som 05/04/2023  Procedures:  2D echo 05/04/2023  Chest x-ray 05/03/2023   Antimicrobials:  Anti-infectives (From admission, onward)    Start     Dose/Rate Route Frequency Ordered Stop   05/04/23 2200  trimethoprim (TRIMPEX) tablet 100 mg        100 mg Oral Daily at bedtime 05/04/23 0430           Subjective: Laying in bed somewhat flat.  Denies any chest pain or shortness of breath.  No abdominal pain.  Feels lower extremity swelling has improved.  Overall feeling better.  Heart rate better improved.  Cardiologist at bedside.  Daughter is at bedside.   Objective: Vitals:   05/04/23 2337 05/05/23 0225 05/05/23 0557 05/05/23 0755  BP: 111/60 (!) 105/56  (!) 121/57  Pulse: 94 84  80  Resp: 18 18  18   Temp: 97.9 F (36.6 C) 98.3 F (36.8 C)  (!) 97.3 F (36.3 C)  TempSrc: Oral Oral  Oral  SpO2: 96% 99%  95%  Weight:   48.5 kg   Height:         Intake/Output Summary (Last 24 hours) at 05/05/2023 1045 Last data filed at 05/05/2023 0838 Gross per 24 hour  Intake 1000 ml  Output 3300 ml  Net -2300 ml    Filed Weights   05/04/23 0400 05/04/23 0504 05/05/23 0557  Weight: 50.3 kg 50.3 kg 48.5 kg    Examination:  General exam: NAD. Respiratory system: Lungs clear to auscultation bilaterally.  No wheezes, no crackles, no rhonchi.  Cardiovascular system: Irregularly irregular.  Trace bilateral lower extremity edema.  Gastrointestinal system: Abdomen is soft, nontender, nondistended, positive bowel sounds.  No rebound.  No guarding.  Central nervous system: Alert and oriented. No focal neurological deficits. Extremities: Symmetric 5 x 5 power. Skin: No rashes, lesions or ulcers Psychiatry: Judgement and insight appear normal. Mood & affect appropriate.     Data Reviewed: I have personally reviewed following labs and imaging studies  CBC: Recent Labs  Lab 05/03/23 2225 05/04/23 0842  WBC 5.7 6.2  HGB 11.7* 12.0  HCT 37.6 38.2  MCV 108.4* 108.2*  PLT 207 192     Basic Metabolic Panel: Recent Labs  Lab 05/03/23 2225 05/04/23 0842 05/05/23 0125  NA 137 136 137  K 5.0 4.3 3.9  CL 102 100 95*  CO2 24  26 28  GLUCOSE 164* 153* 117*  BUN 23 21 26*  CREATININE 1.66* 1.47* 1.88*  CALCIUM 8.5* 8.7* 8.7*  MG  --  1.9  --      GFR: Estimated Creatinine Clearance: 16.8 mL/min (A) (by C-G formula based on SCr of 1.88 mg/dL (H)).  Liver Function Tests: No results for input(s): "AST", "ALT", "ALKPHOS", "BILITOT", "PROT", "ALBUMIN" in the last 168 hours.  CBG: No results for input(s): "GLUCAP" in the last 168 hours.   No results found for this or any previous visit (from the past 240 hour(s)).       Radiology Studies: US RENAL  Result Date: 05/05/2023 CLINICAL DATA:  Acute renal failure EXAM: RENAL / URINARY TRACT ULTRASOUND COMPLETE COMPARISON:  None Available. FINDINGS: Right Kidney: Renal  measurements: 7.1 x 3.3 x 4.0 cm = volume: 47 mL. Atrophic. Echogenicity within normal limits. No mass or hydronephrosis visualized. Left Kidney: Renal measurements: 7.1 x 3.7 x 4.2 cm = volume: 50 mL. Atrophic echogenicity within normal limits. No mass or hydronephrosis visualized. Bladder: Appears normal for degree of bladder distention. Other: None. IMPRESSION: Atrophic kidneys.  No hydronephrosis. Electronically Signed   By: Jearld Lesch M.D.   On: 05/05/2023 09:36   ECHOCARDIOGRAM COMPLETE  Result Date: 05/04/2023    ECHOCARDIOGRAM REPORT   Patient Name:   Anne Anthony Date of Exam: 05/04/2023 Medical Rec #:  [id number]       Height:       64.0 in Accession #:    [id number]      Weight:       110.9 lb Date of Birth:  03/05/1936       BSA:          1.523 m Patient Age:    87 years        BP:           122/82 mmHg Patient Gender: F               HR:           57 bpm. Exam Location:  Inpatient Procedure: 2D Echo, Cardiac Doppler, Color Doppler and Intracardiac            Opacification Agent Indications:    A Flutter I48.92  History:        Patient has no prior history of Echocardiogram examinations.                 CHF, Pacemaker, Arrythmias:Atrial Fibrillation and Tachycardia;                 Risk Factors:Hypertension and Dyslipidemia.  Sonographer:    Lucendia Herrlich Referring Phys: [id number] BRIAN S CRENSHAW  Sonographer Comments: Technically difficult study due to poor echo windows. IMPRESSIONS  1. Left ventricular ejection fraction, by estimation, is 50%. The left ventricle has low normal function. The left ventricle has no regional wall motion abnormalities. Left ventricular diastolic parameters are indeterminate.  2. Right ventricular systolic function is mildly reduced. The right ventricular size is moderately enlarged. There is normal pulmonary artery systolic pressure.  3. Left atrial size was moderately dilated.  4. Right atrial size was moderately dilated.  5. The mitral valve is degenerative.  Trivial mitral valve regurgitation. No evidence of mitral stenosis.  6. The aortic valve is grossly normal. Aortic valve regurgitation is not visualized. No aortic stenosis is present.  7. The inferior vena cava is normal in size with greater than 50% respiratory variability, suggesting right atrial pressure  of 3 mmHg. FINDINGS  Left Ventricle: Left ventricular ejection fraction, by estimation, is 50%. The left ventricle has low normal function. The left ventricle has no regional wall motion abnormalities. Definity contrast agent was given IV to delineate the left ventricular endocardial borders. The left ventricular internal cavity size was normal in size. Suboptimal image quality limits for assessment of left ventricular hypertrophy. Left ventricular diastolic parameters are indeterminate. Right Ventricle: The right ventricular size is moderately enlarged. No increase in right ventricular wall thickness. Right ventricular systolic function is mildly reduced. There is normal pulmonary artery systolic pressure. The tricuspid regurgitant velocity is 2.36 m/s, and with an assumed right atrial pressure of 3 mmHg, the estimated right ventricular systolic pressure is 25.3 mmHg. Left Atrium: Left atrial size was moderately dilated. Right Atrium: Right atrial size was moderately dilated. Pericardium: There is no evidence of pericardial effusion. Mitral Valve: The mitral valve is degenerative in appearance. Trivial mitral valve regurgitation. No evidence of mitral valve stenosis. Tricuspid Valve: The tricuspid valve is grossly normal. Tricuspid valve regurgitation is mild . No evidence of tricuspid stenosis. Aortic Valve: The aortic valve is grossly normal. Aortic valve regurgitation is not visualized. No aortic stenosis is present. Aortic valve peak gradient measures 2.9 mmHg. Pulmonic Valve: The pulmonic valve was grossly normal. Pulmonic valve regurgitation is trivial. No evidence of pulmonic stenosis. Aorta: The aortic  root is normal in size and structure. Venous: The inferior vena cava is normal in size with greater than 50% respiratory variability, suggesting right atrial pressure of 3 mmHg. IAS/Shunts: No atrial level shunt detected by color flow Doppler. Additional Comments: A device lead is visualized in the right atrium and right ventricle.  LEFT VENTRICLE PLAX 2D LVOT diam:     2.00 cm   Diastology LV SV:         23        LV e' medial:    10.90 cm/s LV SV Index:   15        LV E/e' medial:  5.9 LVOT Area:     3.14 cm  LV e' lateral:   13.70 cm/s                          LV E/e' lateral: 4.7  RIGHT VENTRICLE            IVC RV S prime:     5.59 cm/s  IVC diam: 1.90 cm TAPSE (M-mode): 1.3 cm LEFT ATRIUM             Index LA Vol (A2C):   38.7 ml 25.42 ml/m LA Vol (A4C):   37.8 ml 24.83 ml/m LA Biplane Vol: 39.8 ml 26.14 ml/m  AORTIC VALVE AV Area (Vmax): 1.68 cm AV Vmax:        84.50 cm/s AV Peak Grad:   2.9 mmHg LVOT Vmax:      45.13 cm/s LVOT Vmean:     28.133 cm/s LVOT VTI:       0.073 m  AORTA Ao Root diam: 2.80 cm Ao Asc diam:  2.60 cm MITRAL VALVE               TRICUSPID VALVE MV Area (PHT): 6.54 cm    TR Peak grad:   22.3 mmHg MV Decel Time: 116 msec    TR Vmax:        236.00 cm/s MR Peak grad: 18.7 mmHg MR Vmax:      216.00 cm/s  SHUNTS MV E velocity: 64.80 cm/s  Systemic VTI:  0.07 m                            Systemic Diam: 2.00 cm  Weston Brass MD Electronically signed by Weston Brass MD Signature Date/Time: 05/04/2023/4:54:39 PM    Final    DG Chest 2 View  Result Date: 05/03/2023 CLINICAL DATA:  Shortness of breath with lower extremity edema EXAM: CHEST - 2 VIEW COMPARISON:  Radiograph 07/04/2021 FINDINGS: Stable enlargement of the cardiomediastinal silhouette. Aortic atherosclerotic calcification. Left chest wall pacemaker. Large hiatal hernia containing intrathoracic stomach and colon. Elevated left hemidiaphragm. Left greater than right basilar atelectasis. No definite pleural effusion no  pneumothorax. IMPRESSION: 1. Large hiatal hernia with intrathoracic stomach and colon. 2. Bibasilar atelectasis. 3. Cardiomegaly. Electronically Signed   By: Minerva Fester M.D.   On: 05/03/2023 22:40        Scheduled Meds:  aspirin EC  81 mg Oral QHS   enoxaparin (LOVENOX) injection  30 mg Subcutaneous Q24H   escitalopram  5 mg Oral Daily   furosemide  40 mg Intravenous Q12H   gabapentin  300 mg Oral BID   latanoprost  1 drop Both Eyes QHS   metoprolol tartrate  50 mg Oral BID   pantoprazole  40 mg Oral BID AC   simvastatin  20 mg Oral QPM   trimethoprim  100 mg Oral QHS   Continuous Infusions:   LOS: 1 day    Time spent: 35 minutes    Ramiro Harvest, MD [facility]   To contact the attending provider between 7A-7P or the covering provider during after hours 7P-7A, please log into the web site [email or web address] and access using universal [place] password for that web site. If you do not have the password, please call the hospital operator.  05/05/2023, 10:45 AM

## 2023-05-06 DIAGNOSIS — F32A Depression, unspecified: Secondary | ICD-10-CM | POA: Diagnosis not present

## 2023-05-06 DIAGNOSIS — G6289 Other specified polyneuropathies: Secondary | ICD-10-CM | POA: Diagnosis not present

## 2023-05-06 DIAGNOSIS — I480 Paroxysmal atrial fibrillation: Secondary | ICD-10-CM | POA: Diagnosis not present

## 2023-05-06 DIAGNOSIS — I509 Heart failure, unspecified: Secondary | ICD-10-CM | POA: Diagnosis not present

## 2023-05-06 DIAGNOSIS — I4892 Unspecified atrial flutter: Secondary | ICD-10-CM

## 2023-05-06 LAB — BASIC METABOLIC PANEL
Anion gap: 9 (ref 5–15)
BUN: 32 mg/dL — ABNORMAL HIGH (ref 8–23)
CO2: 28 mmol/L (ref 22–32)
Calcium: 8.5 mg/dL — ABNORMAL LOW (ref 8.9–10.3)
Chloride: 98 mmol/L (ref 98–111)
Creatinine, Ser: 1.59 mg/dL — ABNORMAL HIGH (ref 0.44–1.00)
GFR, Estimated: 32 mL/min — ABNORMAL LOW (ref >60)
Glucose, Bld: 149 mg/dL — ABNORMAL HIGH (ref 70–99)
Potassium: 3.7 mmol/L (ref 3.5–5.1)
Sodium: 135 mmol/L (ref 135–145)

## 2023-05-06 LAB — CBC
HCT: 35.9 % — ABNORMAL LOW (ref 36.0–46.0)
Hemoglobin: 11.5 g/dL — ABNORMAL LOW (ref 12.0–15.0)
MCH: 33.7 pg (ref 26.0–34.0)
MCHC: 32 g/dL (ref 30.0–36.0)
MCV: 105.3 fL — ABNORMAL HIGH (ref 80.0–100.0)
Platelets: 200 10*3/uL (ref 150–400)
RBC: 3.41 MIL/uL — ABNORMAL LOW (ref 3.87–5.11)
RDW: 13 % (ref 11.5–15.5)
WBC: 6.5 10*3/uL (ref 4.0–10.5)
nRBC: 0 % (ref 0.0–0.2)

## 2023-05-06 LAB — MAGNESIUM: Magnesium: 2 mg/dL (ref 1.7–2.4)

## 2023-05-06 MED ORDER — APIXABAN 2.5 MG PO TABS: 2.5000 mg | ORAL_TABLET | Freq: Two times a day (BID) | ORAL | 1 refills | Status: DC

## 2023-05-06 MED ORDER — GABAPENTIN 600 MG PO TABS: 300.0000 mg | ORAL_TABLET | Freq: Two times a day (BID) | ORAL | Status: DC

## 2023-05-06 MED ORDER — FUROSEMIDE 40 MG PO TABS: 40.0000 mg | ORAL_TABLET | Freq: Every day | ORAL | 1 refills | Status: DC

## 2023-05-06 MED ORDER — POTASSIUM CHLORIDE CRYS ER 20 MEQ PO TBCR: 20.0000 meq | EXTENDED_RELEASE_TABLET | Freq: Every day | ORAL | 1 refills | Status: DC

## 2023-05-06 MED ORDER — METOPROLOL TARTRATE 50 MG PO TABS: 50.0000 mg | ORAL_TABLET | Freq: Two times a day (BID) | ORAL | 1 refills | Status: DC

## 2023-05-06 NOTE — Progress Notes (Signed)
Rounding Note    Patient Name: Anne Anthony Date of Encounter: 05/06/2023   [facility] Cardiologist: Thurmon Fair, MD   Subjective   Feels well.  At rest with activity.  Has adequate control.  Renal function trending towards baseline.  Inpatient Medications    Scheduled Meds:  apixaban  2.5 mg Oral BID   aspirin EC  81 mg Oral QHS   escitalopram  5 mg Oral Daily   furosemide  40 mg Oral Daily   gabapentin  300 mg Oral BID   latanoprost  1 drop Both Eyes QHS   metoprolol tartrate  50 mg Oral BID   pantoprazole  40 mg Oral BID AC   simvastatin  20 mg Oral QPM   trimethoprim  100 mg Oral QHS   Continuous Infusions:  PRN Meds: acetaminophen **OR** acetaminophen, magnesium hydroxide, meclizine, ondansetron **OR** ondansetron (ZOFRAN) IV, traZODone   Vital Signs    Vitals:   05/06/23 0338 05/06/23 0400 05/06/23 0500 05/06/23 0753  BP: 104/66   112/63  Pulse: 74 81  83  Resp: 17   18  Temp: 98.1 F (36.7 C)   97.7 F (36.5 C)  TempSrc: Oral   Oral  SpO2: 93% 93%  92%  Weight:   50 kg   Height:        Intake/Output Summary (Last 24 hours) at 05/06/2023 0859 Last data filed at 05/05/2023 2000 Gross per 24 hour  Intake 480 ml  Output --  Net 480 ml      05/06/2023    5:00 AM 05/05/2023    5:57 AM 05/04/2023    5:04 AM  Last 3 Weights  Weight (lbs) 110 lb 3.7 oz 106 lb 14.8 oz 110 lb 14.3 oz  Weight (kg) 50 kg 48.5 kg 50.3 kg      Telemetry    Atrial fibrillation controlled ventricular response.  Rare ventricular paced beats.- Personally Reviewed  ECG    No new tracing- Personally Reviewed  Physical Exam  Elderly, frail GEN: No acute distress.   Neck: No JVD Cardiac: Irregular, no murmurs, rubs, or gallops.  Respiratory: Clear to auscultation bilaterally.  Bowel sounds heard in chest. GI: Soft, nontender, non-distended  MS: No edema; No deformity. Neuro:  Nonfocal  Psych: Normal affect   Labs    High Sensitivity Troponin:    Recent Labs  Lab 05/03/23 2225 05/04/23 0030  TROPONINIHS 22* 23*     Chemistry Recent Labs  Lab 05/04/23 0842 05/05/23 0125 05/06/23 0137  NA 136 137 135  K 4.3 3.9 3.7  CL 100 95* 98  CO2 26 28 28   GLUCOSE 153* 117* 149*  BUN 21 26* 32*  CREATININE 1.47* 1.88* 1.59*  CALCIUM 8.7* 8.7* 8.5*  MG 1.9  --  2.0  GFRNONAA 35* 26* 32*  ANIONGAP 10 14 9     Lipids No results for input(s): "CHOL", "TRIG", "HDL", "LABVLDL", "LDLCALC", "CHOLHDL" in the last 168 hours.  Hematology Recent Labs  Lab 05/03/23 2225 05/04/23 0842 05/06/23 0137  WBC 5.7 6.2 6.5  RBC 3.47* 3.53* 3.41*  HGB 11.7* 12.0 11.5*  HCT 37.6 38.2 35.9*  MCV 108.4* 108.2* 105.3*  MCH 33.7 34.0 33.7  MCHC 31.1 31.4 32.0  RDW 13.0 13.1 13.0  PLT 207 192 200   Thyroid  Recent Labs  Lab 05/05/23 0125  TSH 0.691    BNP Recent Labs  Lab 05/03/23 2225  BNP 638.3*    DDimer No results for input(s): "DDIMER"  in the last 168 hours.   Radiology    US RENAL  Result Date: 05/05/2023 CLINICAL DATA:  Acute renal failure EXAM: RENAL / URINARY TRACT ULTRASOUND COMPLETE COMPARISON:  None Available. FINDINGS: Right Kidney: Renal measurements: 7.1 x 3.3 x 4.0 cm = volume: 47 mL. Atrophic. Echogenicity within normal limits. No mass or hydronephrosis visualized. Left Kidney: Renal measurements: 7.1 x 3.7 x 4.2 cm = volume: 50 mL. Atrophic echogenicity within normal limits. No mass or hydronephrosis visualized. Bladder: Appears normal for degree of bladder distention. Other: None. IMPRESSION: Atrophic kidneys.  No hydronephrosis. Electronically Signed   By: Jearld Lesch M.D.   On: 05/05/2023 09:36   ECHOCARDIOGRAM COMPLETE  Result Date: 05/04/2023    ECHOCARDIOGRAM REPORT   Patient Name:   Anne Anthony Date of Exam: 05/04/2023 Medical Rec #:  [id number]       Height:       64.0 in Accession #:    [id number]      Weight:       110.9 lb Date of Birth:  [birth year]       BSA:          1.523 m Patient Age:    87 years         BP:           122/82 mmHg Patient Gender: F               HR:           57 bpm. Exam Location:  Inpatient Procedure: 2D Echo, Cardiac Doppler, Color Doppler and Intracardiac            Opacification Agent Indications:    A Flutter I48.92  History:        Patient has no prior history of Echocardiogram examinations.                 CHF, Pacemaker, Arrythmias:Atrial Fibrillation and Tachycardia;                 Risk Factors:Hypertension and Dyslipidemia.  Sonographer:    Lucendia Herrlich Referring Phys: [id number] BRIAN S CRENSHAW  Sonographer Comments: Technically difficult study due to poor echo windows. IMPRESSIONS  1. Left ventricular ejection fraction, by estimation, is 50%. The left ventricle has low normal function. The left ventricle has no regional wall motion abnormalities. Left ventricular diastolic parameters are indeterminate.  2. Right ventricular systolic function is mildly reduced. The right ventricular size is moderately enlarged. There is normal pulmonary artery systolic pressure.  3. Left atrial size was moderately dilated.  4. Right atrial size was moderately dilated.  5. The mitral valve is degenerative. Trivial mitral valve regurgitation. No evidence of mitral stenosis.  6. The aortic valve is grossly normal. Aortic valve regurgitation is not visualized. No aortic stenosis is present.  7. The inferior vena cava is normal in size with greater than 50% respiratory variability, suggesting right atrial pressure of 3 mmHg. FINDINGS  Left Ventricle: Left ventricular ejection fraction, by estimation, is 50%. The left ventricle has low normal function. The left ventricle has no regional wall motion abnormalities. Definity contrast agent was given IV to delineate the left ventricular endocardial borders. The left ventricular internal cavity size was normal in size. Suboptimal image quality limits for assessment of left ventricular hypertrophy. Left ventricular diastolic parameters are indeterminate. Right  Ventricle: The right ventricular size is moderately enlarged. No increase in right ventricular wall thickness. Right ventricular systolic function is mildly reduced. There is normal  pulmonary artery systolic pressure. The tricuspid regurgitant velocity is 2.36 m/s, and with an assumed right atrial pressure of 3 mmHg, the estimated right ventricular systolic pressure is 25.3 mmHg. Left Atrium: Left atrial size was moderately dilated. Right Atrium: Right atrial size was moderately dilated. Pericardium: There is no evidence of pericardial effusion. Mitral Valve: The mitral valve is degenerative in appearance. Trivial mitral valve regurgitation. No evidence of mitral valve stenosis. Tricuspid Valve: The tricuspid valve is grossly normal. Tricuspid valve regurgitation is mild . No evidence of tricuspid stenosis. Aortic Valve: The aortic valve is grossly normal. Aortic valve regurgitation is not visualized. No aortic stenosis is present. Aortic valve peak gradient measures 2.9 mmHg. Pulmonic Valve: The pulmonic valve was grossly normal. Pulmonic valve regurgitation is trivial. No evidence of pulmonic stenosis. Aorta: The aortic root is normal in size and structure. Venous: The inferior vena cava is normal in size with greater than 50% respiratory variability, suggesting right atrial pressure of 3 mmHg. IAS/Shunts: No atrial level shunt detected by color flow Doppler. Additional Comments: A device lead is visualized in the right atrium and right ventricle.  LEFT VENTRICLE PLAX 2D LVOT diam:     2.00 cm   Diastology LV SV:         23        LV e' medial:    10.90 cm/s LV SV Index:   15        LV E/e' medial:  5.9 LVOT Area:     3.14 cm  LV e' lateral:   13.70 cm/s                          LV E/e' lateral: 4.7  RIGHT VENTRICLE            IVC RV S prime:     5.59 cm/s  IVC diam: 1.90 cm TAPSE (M-mode): 1.3 cm LEFT ATRIUM             Index LA Vol (A2C):   38.7 ml 25.42 ml/m LA Vol (A4C):   37.8 ml 24.83 ml/m LA Biplane Vol:  39.8 ml 26.14 ml/m  AORTIC VALVE AV Area (Vmax): 1.68 cm AV Vmax:        84.50 cm/s AV Peak Grad:   2.9 mmHg LVOT Vmax:      45.13 cm/s LVOT Vmean:     28.133 cm/s LVOT VTI:       0.073 m  AORTA Ao Root diam: 2.80 cm Ao Asc diam:  2.60 cm MITRAL VALVE               TRICUSPID VALVE MV Area (PHT): 6.54 cm    TR Peak grad:   22.3 mmHg MV Decel Time: 116 msec    TR Vmax:        236.00 cm/s MR Peak grad: 18.7 mmHg MR Vmax:      216.00 cm/s  SHUNTS MV E velocity: 64.80 cm/s  Systemic VTI:  0.07 m                            Systemic Diam: 2.00 cm  Weston Brass MD Electronically signed by Weston Brass MD Signature Date/Time: 05/04/2023/4:54:39 PM    Final     Cardiac Studies   Normal comprehensive interrogation of the pacemaker yesterday.  Patient Profile     87 y.o. 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  Plan elective DC cardioversion after a minimum of 3 weeks (tentatively will schedule for July 16). Will make an appointment to the A-fib clinic just before that atrial fibrillation and check labs. We discussed the pros and cons of starting antiarrhythmic therapy as well.  Unfortunately due to her comorbid conditions there are good choices.  I think we could use amiodarone if necessary since it appears that her ventilatory problems are secondary to restriction from diaphragmatic hernia. We decided to stick with rhythm control meds only for now.  [facility] will sign off.   Medication Recommendations:   Furosemide 40 mg once daily Eliquis 2.5 mg twice daily Metoprolol tartrate 50 mg twice  daily Other recommendations (labs, testing, etc): Repeat basic metabolic panel in 3 weeks Follow up as an outpatient: A-fib clinic in 3 weeks; DC cardioversion 06/29/2023.  For questions or updates, please contact [facility] Please consult [email or web address] for contact info under        Signed, Thurmon Fair, MD  05/06/2023, 8:59 AM

## 2023-05-06 NOTE — Discharge Instructions (Signed)
Information on my medicine - ELIQUIS (apixaban)  This medication education was reviewed with me or my healthcare representative as part of my discharge preparation.  The pharmacist that spoke with me during my hospital stay was:  Wania Longstreth A Xochilth Standish, RPH  Why was Eliquis prescribed for you? Eliquis was prescribed for you to reduce the risk of a blood clot forming that can cause a stroke if you have a medical condition called atrial fibrillation (a type of irregular heartbeat).  What do You need to know about Eliquis ? Take your Eliquis TWICE DAILY - one tablet in the morning and one tablet in the evening with or without food. If you have difficulty swallowing the tablet whole please discuss with your pharmacist how to take the medication safely.  Take Eliquis exactly as prescribed by your doctor and DO NOT stop taking Eliquis without talking to the doctor who prescribed the medication.  Stopping may increase your risk of developing a stroke.  Refill your prescription before you run out.  After discharge, you should have regular check-up appointments with your healthcare provider that is prescribing your Eliquis.  In the future your dose may need to be changed if your kidney function or weight changes by a significant amount or as you get older.  What do you do if you miss a dose? If you miss a dose, take it as soon as you remember on the same day and resume taking twice daily.  Do not take more than one dose of ELIQUIS at the same time to make up a missed dose.  Important Safety Information A possible side effect of Eliquis is bleeding. You should call your healthcare provider right away if you experience any of the following: Bleeding from an injury or your nose that does not stop. Unusual colored urine (red or dark brown) or unusual colored stools (red or black). Unusual bruising for unknown reasons. A serious fall or if you hit your head (even if there is no bleeding).  Some medicines  may interact with Eliquis and might increase your risk of bleeding or clotting while on Eliquis. To help avoid this, consult your healthcare provider or pharmacist prior to using any new prescription or non-prescription medications, including herbals, vitamins, non-steroidal anti-inflammatory drugs (NSAIDs) and supplements.  This website has more information on Eliquis (apixaban): http://www.eliquis.com/eliquis/home

## 2023-05-06 NOTE — Progress Notes (Signed)
Patient given discharge instructions. PIV removed. Telemetry box removed, CCMD notified. Patient taken to vehicle in wheelchair by staff.  Narelle Schoening L Antonae Zbikowski, RN  

## 2023-05-06 NOTE — Discharge Summary (Signed)
Physician Discharge Summary  Anne Anthony:[id number] DOB: [birth year] DOA: 05/03/2023  PCP: [name], MD  Admit date: 05/03/2023 Discharge date: 05/06/2023  Time spent: 60 minutes  Recommendations for Outpatient Follow-up:  Follow-up with [name], MD in 1 to 2 weeks.  On follow-up patient will need a basic metabolic profile done to follow-up on electrolytes and renal function.  Patient's peripheral neuropathy will need to be reassessed as patient's Neurontin dose was decreased during this hospitalization due to AKI. Follow-up with [name], cardiology in 3 weeks.   Discharge Diagnoses:  Principal Problem:   New onset of congestive heart failure (HCC) Active Problems:   Paroxysmal atrial fibrillation with RVR (HCC)   Acute kidney injury superimposed on chronic kidney disease (HCC)   GERD without esophagitis   Essential hypertension   Dyslipidemia   Depression   Peripheral neuropathy   AKI (acute kidney injury) (HCC)   Atrial flutter with rapid ventricular response (HCC)   Discharge Condition: Stable and improved.  Diet recommendation: Heart healthy  Filed Weights   05/04/23 0504 05/05/23 0557 05/06/23 0500  Weight: 50.3 kg 48.5 kg 50 kg    History of present illness:  HPI per Dr. [name] is a 87 y.o. Caucasian female with medical history significant for paroxysmal atrial fibrillation/flutter, diverticulosis, subarachnoid hemorrhage, hypertension, dyslipidemia, tachybradycardia syndrome, status post pacemaker placement, who presented to the emergency room with acute onset of worsening dyspnea as well as lower extremity edema over the last several days with associated orthopnea and dyspnea on exertion.  No chest pain or palpitations.  No cough or wheezing or hemoptysis.  No nausea or vomiting or abdominal pain.  No dysuria, oliguria or hematuria or flank pain.   ED Course: When she came to the ER, heart rate was 129 and later 132 with otherwise  normal vital signs.  Labs revealed a creatinine of 1.66 up from 1.17 on 07/04/2021 and blood glucose of 164, BNP 638.3 and high sensitive troponin I 22 and later 23.  CBC showed mild anemia with hemoglobin 11.7 down from 13.7 on 07/04/2021 with a hematocrit of 37.6 and microcytosis. EKG as reviewed by me : Atrial flutter with variable block with a rate of 98. Imaging: Two-view chest x-ray showed bibasilar atelectasis, cardiomegaly and large hiatal hernia with intrathoracic stomach and colon.   The patient was given 20 mg of IV Lasix.  She will be admitted to the cardiac telemetry observation bed for further evaluation and management.  Hospital Course:  #1 new onset acute diastolic CHF -Likely secondary to paroxysmal atrial fibrillation/atrial flutter as patient stated has noted palpitations and fluttering whenever she lays down and goes to bed recently. -BNP noted to be elevated at 638.3 on admission. -High-sensitivity troponin of 22> 23. -2D echo with a EF of 50%, NWMA, mildly reduced right ventricular systolic function, right ventricular size moderately enlarged, normal pulmonary artery systolic pressure, moderately dilated left atrial size, moderately dilated right atrial size, degenerative mitral valve, trivial MVR. -Patient placed on IV Lasix with good urine output and clinical improvement. -Patient noted to be -3.1 L during this hospitalization.      -Patient with a bump in creatinine and as such IV Lasix was subsequently transition to oral Lasix 40 mg daily which patient tolerated.   -Patient improved clinically and was euvolemic by day of discharge.   -Patient maintained on beta-blocker, statin and initially was on aspirin which will be discontinued on discharge as patient has been started on Eliquis per  cardiology.   -Patient was discharged on Lasix 40 mg daily in addition to potassium supplementation 20 mEq daily.   -Outpatient follow-up with cardiology and PCP.    2.  Paroxysmal atrial  fibrillation/atrial flutter -Patient noted to be in atrial flutter on admission with heart rates in the high 90s. -Heart rate improved with increased dose of metoprolol.  -Patient presented with new onset diastolic CHF could likely be secondary to A-fib/a flutter. -Patient stated had been having palpitations whenever she goes to bed lately. -Patient seen by cardiology, and Lopressor dose increased for better rate control to 50 mg twice daily.  -Pacemaker interrogation was performed on 05/05/2023 per cardiology which showed a normal device function, it is noted that patient only had A-fib starting 04/12/2023 lasting about 2 days and then again on 04/30/2023 and currently ongoing.  Per cardiology low burden of ventricular pacing and atrial fibrillation virtually 100% atrial paced, ventricular sensed rhythm.  -Cardiology feels patient is not a candidate for TEE due to a normal diaphragmatic hernia and recommending 3 weeks of oral anticoagulation with Eliquis 2.5 mg twice daily prior to DC cardioversion followed by another 4 weeks of oral anticoagulation.  -It is felt that patient is not a good long-term candidate for anticoagulation due to history of frequent falls.  -Patient is on Eliquis 2.5 mg twice daily during the hospitalization which she tolerated. -Per cardiology patient is not a candidate for Watchman device either.  -Patient's rate was better controlled on Lopressor 50 mg twice daily which patient be discharged home on. -Outpatient follow-up with cardiology in 3 weeks. -Patient will be discharged in stable and improved condition.   3.  Hyperlipidemia -Patient maintained on home regimen statin.   4.  AKI on CKD stage IIIa -Creatinine on presentation 1.66, last creatinine noted of 1.17 (07/04/2021) -Likely secondary to prerenal azotemia secondary to acute CHF exacerbation. -Patient on IV Lasix with good urine output during the hospitalization and was -3.1 L.   -Renal function was trending back  down however trended back up as high as 1.88 during the hospitalization after aggressive IV diuresis with Lasix.   -IV Lasix subsequently transition to oral Lasix, renal function improved and was trending back down towards baseline such that by day of discharge creatinine was down to 1.59.   -Outpatient follow-up with PCP.    5.  GERD/large hiatal hernia with intrathoracic stomach and colon (x-ray 05/03/2023) -Patient remained asymptomatic throughout the hospitalization.  -Patient maintained on PPI twice daily and will be discharged back on home regimen of PPI.  -Outpatient follow-up.    6.  Depression -Remained stable on home regimen Lexapro during the hospitalization.   -Outpatient follow-up.     7.  Peripheral neuropathy -Patient maintained on decreased dose of Neurontin at 300 mg twice daily due to patient's renal function.   -Outpatient follow-up with PCP for repeat labs and to assess whether Neurontin dose may be adjusted.     8.  Hypertension -Controlled on Lopressor during the hospitalization.   -Outpatient follow-up.  9.  SSS status post PPM - Pacemaker interrogation was performed on 05/05/2023 per cardiology which showed a normal device function, it is noted that patient only had A-fib starting 04/12/2023 lasting about 2 days and then again on 04/30/2023 and currently ongoing.  Per cardiology low burden of ventricular pacing and atrial fibrillation virtually 100% atrial paced, ventricular sensed rhythm.  -Per cardiology.  Procedures: 2D echo 05/04/2023 Chest x-ray 05/03/2023  Consultations: Cardiology: Dr. Jens Som 05/04/2023  Discharge Exam:  Vitals:   05/06/23 0400 05/06/23 0753  BP:  112/63  Pulse: 81 83  Resp:  18  Temp:  97.7 F (36.5 C)  SpO2: 93% 92%    General: NAD Cardiovascular: Irregularly irregular.  No JVD.  No murmurs rubs or gallops.  No lower extremity edema. Respiratory: Clear to auscultation bilaterally.  No wheezes, no crackles, no rhonchi.  Fair air  movement.  Speaking in full sentences.  Discharge Instructions   Discharge Instructions     Diet - low sodium heart healthy   Complete by: As directed    Discharge wound care:   Complete by: As directed    Wound care to Right LE skin tear: Cleanse with NS, pat dry. Apply folded layers of xeroform gauze [place] [phone]). Top with dry gauze, cover with ABD pad and secure with a few turns of Kerlix roll gauze/paper tape. Place no tape on skin. Place foot into Prevalon boot.   Increase activity slowly   Complete by: As directed       Allergies as of 05/06/2023       Reactions   Morphine And Codeine Other (See Comments)   Hallucination        Medication List     STOP taking these medications    aspirin EC 81 MG tablet       TAKE these medications    acetaminophen 325 MG tablet Commonly known as: TYLENOL Take 1-2 tablets (325-650 mg total) by mouth every 4 (four) hours as needed for fever, headache or mild pain.   apixaban 2.5 MG Tabs tablet Commonly known as: ELIQUIS Take 1 tablet (2.5 mg total) by mouth 2 (two) times daily.   dexlansoprazole 60 MG capsule Commonly known as: Dexilant Take 1 capsule (60 mg total) by mouth daily.   escitalopram 10 MG tablet Commonly known as: LEXAPRO Take 5 mg by mouth daily.   furosemide 40 MG tablet Commonly known as: LASIX Take 1 tablet (40 mg total) by mouth daily. Start taking on: May 07, 2023   gabapentin 600 MG tablet Commonly known as: NEURONTIN Take 0.5 tablets (300 mg total) by mouth 2 (two) times daily. What changed: how much to take   Lumigan 0.01 % Soln Generic drug: bimatoprost Place 1 drop into both eyes at bedtime.   meclizine 25 MG tablet Commonly known as: ANTIVERT Take 25 mg by mouth every 6 (six) hours as needed for dizziness.   metoprolol tartrate 50 MG tablet Commonly known as: LOPRESSOR Take 1 tablet (50 mg total) by mouth 2 (two) times daily. What changed:  medication strength how much to  take   potassium chloride SA 20 MEQ tablet Commonly known as: KLOR-CON M Take 1 tablet (20 mEq total) by mouth daily.   simvastatin 20 MG tablet Commonly known as: ZOCOR Take 20 mg by mouth every evening.   trimethoprim 100 MG tablet Commonly known as: TRIMPEX TAKE 1 TABLET BY MOUTH EVERYDAY AT BEDTIME What changed: See the new instructions.               Discharge Care Instructions  (From admission, onward)           Start     Ordered   05/06/23 0000  Discharge wound care:       Comments: Wound care to Right LE skin tear: Cleanse with NS, pat dry. Apply folded layers of xeroform gauze [place] [phone]). Top with dry gauze, cover with ABD pad and secure with a few turns of Kerlix  roll gauze/paper tape. Place no tape on skin. Place foot into Prevalon boot.   05/06/23 1037           Allergies  Allergen Reactions   Morphine And Codeine Other (See Comments)    Hallucination    Follow-up Information     [name], MD. Schedule an appointment as soon as possible for a visit in 1 week(s).   Specialty: Family Medicine Why: f/u in 1-2 weeks Contact information: [address] [place] [address] [phone]         Croitoru, Rachelle Hora, MD. Schedule an appointment as soon as possible for a visit in 3 week(s).   Specialty: Cardiology Contact information: [address] [place] [address] [phone]                  The results of significant diagnostics from this hospitalization (including imaging, microbiology, ancillary and laboratory) are listed below for reference.    Significant Diagnostic Studies: US RENAL  Result Date: 05/05/2023 CLINICAL DATA:  Acute renal failure EXAM: RENAL / URINARY TRACT ULTRASOUND COMPLETE COMPARISON:  None Available. FINDINGS: Right Kidney: Renal measurements: 7.1 x 3.3 x 4.0 cm = volume: 47 mL. Atrophic. Echogenicity within normal limits. No mass or hydronephrosis visualized. Left Kidney:  Renal measurements: 7.1 x 3.7 x 4.2 cm = volume: 50 mL. Atrophic echogenicity within normal limits. No mass or hydronephrosis visualized. Bladder: Appears normal for degree of bladder distention. Other: None. IMPRESSION: Atrophic kidneys.  No hydronephrosis. Electronically Signed   By: Jearld Lesch M.D.   On: 05/05/2023 09:36   ECHOCARDIOGRAM COMPLETE  Result Date: 05/04/2023    ECHOCARDIOGRAM REPORT   Patient Name:   NEVILLE PAULS Date of Exam: 05/04/2023 Medical Rec #:  [id number]       Height:       64.0 in Accession #:    [id number]      Weight:       110.9 lb Date of Birth:  Feb 13, 1936       BSA:          1.523 m Patient Age:    87 years        BP:           122/82 mmHg Patient Gender: F               HR:           57 bpm. Exam Location:  Inpatient Procedure: 2D Echo, Cardiac Doppler, Color Doppler and Intracardiac            Opacification Agent Indications:    A Flutter I48.92  History:        Patient has no prior history of Echocardiogram examinations.                 CHF, Pacemaker, Arrythmias:Atrial Fibrillation and Tachycardia;                 Risk Factors:Hypertension and Dyslipidemia.  Sonographer:    Lucendia Herrlich Referring Phys: [id number] BRIAN S CRENSHAW  Sonographer Comments: Technically difficult study due to poor echo windows. IMPRESSIONS  1. Left ventricular ejection fraction, by estimation, is 50%. The left ventricle has low normal function. The left ventricle has no regional wall motion abnormalities. Left ventricular diastolic parameters are indeterminate.  2. Right ventricular systolic function is mildly reduced. The right ventricular size is moderately enlarged. There is normal pulmonary artery systolic pressure.  3. Left atrial size was moderately dilated.  4. Right  atrial size was moderately dilated.  5. The mitral valve is degenerative. Trivial mitral valve regurgitation. No evidence of mitral stenosis.  6. The aortic valve is grossly normal. Aortic valve regurgitation is not  visualized. No aortic stenosis is present.  7. The inferior vena cava is normal in size with greater than 50% respiratory variability, suggesting right atrial pressure of 3 mmHg. FINDINGS  Left Ventricle: Left ventricular ejection fraction, by estimation, is 50%. The left ventricle has low normal function. The left ventricle has no regional wall motion abnormalities. Definity contrast agent was given IV to delineate the left ventricular endocardial borders. The left ventricular internal cavity size was normal in size. Suboptimal image quality limits for assessment of left ventricular hypertrophy. Left ventricular diastolic parameters are indeterminate. Right Ventricle: The right ventricular size is moderately enlarged. No increase in right ventricular wall thickness. Right ventricular systolic function is mildly reduced. There is normal pulmonary artery systolic pressure. The tricuspid regurgitant velocity is 2.36 m/s, and with an assumed right atrial pressure of 3 mmHg, the estimated right ventricular systolic pressure is 25.3 mmHg. Left Atrium: Left atrial size was moderately dilated. Right Atrium: Right atrial size was moderately dilated. Pericardium: There is no evidence of pericardial effusion. Mitral Valve: The mitral valve is degenerative in appearance. Trivial mitral valve regurgitation. No evidence of mitral valve stenosis. Tricuspid Valve: The tricuspid valve is grossly normal. Tricuspid valve regurgitation is mild . No evidence of tricuspid stenosis. Aortic Valve: The aortic valve is grossly normal. Aortic valve regurgitation is not visualized. No aortic stenosis is present. Aortic valve peak gradient measures 2.9 mmHg. Pulmonic Valve: The pulmonic valve was grossly normal. Pulmonic valve regurgitation is trivial. No evidence of pulmonic stenosis. Aorta: The aortic root is normal in size and structure. Venous: The inferior vena cava is normal in size with greater than 50% respiratory variability,  suggesting right atrial pressure of 3 mmHg. IAS/Shunts: No atrial level shunt detected by color flow Doppler. Additional Comments: A device lead is visualized in the right atrium and right ventricle.  LEFT VENTRICLE PLAX 2D LVOT diam:     2.00 cm   Diastology LV SV:         23        LV e' medial:    10.90 cm/s LV SV Index:   15        LV E/e' medial:  5.9 LVOT Area:     3.14 cm  LV e' lateral:   13.70 cm/s                          LV E/e' lateral: 4.7  RIGHT VENTRICLE            IVC RV S prime:     5.59 cm/s  IVC diam: 1.90 cm TAPSE (M-mode): 1.3 cm LEFT ATRIUM             Index LA Vol (A2C):   38.7 ml 25.42 ml/m LA Vol (A4C):   37.8 ml 24.83 ml/m LA Biplane Vol: 39.8 ml 26.14 ml/m  AORTIC VALVE AV Area (Vmax): 1.68 cm AV Vmax:        84.50 cm/s AV Peak Grad:   2.9 mmHg LVOT Vmax:      45.13 cm/s LVOT Vmean:     28.133 cm/s LVOT VTI:       0.073 m  AORTA Ao Root diam: 2.80 cm Ao Asc diam:  2.60 cm MITRAL VALVE  TRICUSPID VALVE MV Area (PHT): 6.54 cm    TR Peak grad:   22.3 mmHg MV Decel Time: 116 msec    TR Vmax:        236.00 cm/s MR Peak grad: 18.7 mmHg MR Vmax:      216.00 cm/s  SHUNTS MV E velocity: 64.80 cm/s  Systemic VTI:  0.07 m                            Systemic Diam: 2.00 cm  Weston Brass MD Electronically signed by Weston Brass MD Signature Date/Time: 05/04/2023/4:54:39 PM    Final    DG Chest 2 View  Result Date: 05/03/2023 CLINICAL DATA:  Shortness of breath with lower extremity edema EXAM: CHEST - 2 VIEW COMPARISON:  Radiograph 07/04/2021 FINDINGS: Stable enlargement of the cardiomediastinal silhouette. Aortic atherosclerotic calcification. Left chest wall pacemaker. Large hiatal hernia containing intrathoracic stomach and colon. Elevated left hemidiaphragm. Left greater than right basilar atelectasis. No definite pleural effusion no pneumothorax. IMPRESSION: 1. Large hiatal hernia with intrathoracic stomach and colon. 2. Bibasilar atelectasis. 3. Cardiomegaly.  Electronically Signed   By: Minerva Fester M.D.   On: 05/03/2023 22:40    Microbiology: No results found for this or any previous visit (from the past 240 hour(s)).   Labs: Basic Metabolic Panel: Recent Labs  Lab 05/03/23 2225 05/04/23 0842 05/05/23 0125 05/06/23 0137  NA 137 136 137 135  K 5.0 4.3 3.9 3.7  CL 102 100 95* 98  CO2 24 26 28 28   GLUCOSE 164* 153* 117* 149*  BUN 23 21 26* 32*  CREATININE 1.66* 1.47* 1.88* 1.59*  CALCIUM 8.5* 8.7* 8.7* 8.5*  MG  --  1.9  --  2.0   Liver Function Tests: No results for input(s): "AST", "ALT", "ALKPHOS", "BILITOT", "PROT", "ALBUMIN" in the last 168 hours. No results for input(s): "LIPASE", "AMYLASE" in the last 168 hours. No results for input(s): "AMMONIA" in the last 168 hours. CBC: Recent Labs  Lab 05/03/23 2225 05/04/23 0842 05/06/23 0137  WBC 5.7 6.2 6.5  HGB 11.7* 12.0 11.5*  HCT 37.6 38.2 35.9*  MCV 108.4* 108.2* 105.3*  PLT 207 192 200   Cardiac Enzymes: No results for input(s): "CKTOTAL", "CKMB", "CKMBINDEX", "TROPONINI" in the last 168 hours. BNP: BNP (last 3 results) Recent Labs    05/03/23 2225  BNP 638.3*    ProBNP (last 3 results) No results for input(s): "PROBNP" in the last 8760 hours.  CBG: No results for input(s): "GLUCAP" in the last 168 hours.     Signed:  Ramiro Harvest MD.  [facility] 05/06/2023, 10:50 AM

## 2023-05-14 DIAGNOSIS — Z681 Body mass index (BMI) 19 or less, adult: Secondary | ICD-10-CM | POA: Diagnosis not present

## 2023-05-14 DIAGNOSIS — N183 Chronic kidney disease, stage 3 unspecified: Secondary | ICD-10-CM | POA: Diagnosis not present

## 2023-05-14 DIAGNOSIS — I5030 Unspecified diastolic (congestive) heart failure: Secondary | ICD-10-CM | POA: Diagnosis not present

## 2023-05-14 DIAGNOSIS — I480 Paroxysmal atrial fibrillation: Secondary | ICD-10-CM | POA: Diagnosis not present

## 2023-05-15 LAB — BMP8: EGFR: 25

## 2023-05-21 ENCOUNTER — Encounter (HOSPITAL_COMMUNITY): Payer: Self-pay | Admitting: Internal Medicine

## 2023-05-21 ENCOUNTER — Ambulatory Visit (HOSPITAL_COMMUNITY)
Admission: RE | Admit: 2023-05-21 | Discharge: 2023-05-21 | Disposition: A | Discharge status: Home / Self Care | Payer: Medicare PPO | Source: Ambulatory Visit | Attending: Internal Medicine | Admitting: Internal Medicine

## 2023-05-21 VITALS — BP 110/62 | HR 83 | Ht 64.0 in | Wt 105.8 lb

## 2023-05-21 DIAGNOSIS — I4892 Unspecified atrial flutter: Secondary | ICD-10-CM | POA: Insufficient documentation

## 2023-05-21 DIAGNOSIS — I480 Paroxysmal atrial fibrillation: Secondary | ICD-10-CM

## 2023-05-21 DIAGNOSIS — I509 Heart failure, unspecified: Secondary | ICD-10-CM | POA: Diagnosis not present

## 2023-05-21 DIAGNOSIS — K219 Gastro-esophageal reflux disease without esophagitis: Secondary | ICD-10-CM | POA: Diagnosis not present

## 2023-05-21 DIAGNOSIS — I110 Hypertensive heart disease with heart failure: Secondary | ICD-10-CM | POA: Diagnosis not present

## 2023-05-21 DIAGNOSIS — Z7901 Long term (current) use of anticoagulants: Secondary | ICD-10-CM | POA: Insufficient documentation

## 2023-05-21 DIAGNOSIS — D6869 Other thrombophilia: Secondary | ICD-10-CM | POA: Diagnosis not present

## 2023-05-21 DIAGNOSIS — I4819 Other persistent atrial fibrillation: Secondary | ICD-10-CM | POA: Diagnosis not present

## 2023-05-21 LAB — BASIC METABOLIC PANEL
Anion gap: 11 (ref 5–15)
BUN: 34 mg/dL — ABNORMAL HIGH (ref 8–23)
CO2: 24 mmol/L (ref 22–32)
Calcium: 8.8 mg/dL — ABNORMAL LOW (ref 8.9–10.3)
Chloride: 105 mmol/L (ref 98–111)
Creatinine, Ser: 2.01 mg/dL — ABNORMAL HIGH (ref 0.44–1.00)
GFR, Estimated: 24 mL/min — ABNORMAL LOW (ref >60)
Glucose, Bld: 115 mg/dL — ABNORMAL HIGH (ref 70–99)
Potassium: 4.7 mmol/L (ref 3.5–5.1)
Sodium: 140 mmol/L (ref 135–145)

## 2023-05-21 LAB — CBC
HCT: 40 % (ref 36.0–46.0)
Hemoglobin: 12.6 g/dL (ref 12.0–15.0)
MCH: 34.2 pg — ABNORMAL HIGH (ref 26.0–34.0)
MCHC: 31.5 g/dL (ref 30.0–36.0)
MCV: 108.7 fL — ABNORMAL HIGH (ref 80.0–100.0)
Platelets: 227 10*3/uL (ref 150–400)
RBC: 3.68 MIL/uL — ABNORMAL LOW (ref 3.87–5.11)
RDW: 12.8 % (ref 11.5–15.5)
WBC: 6.7 10*3/uL (ref 4.0–10.5)
nRBC: 0 % (ref 0.0–0.2)

## 2023-05-21 NOTE — Patient Instructions (Addendum)
Cardioversion scheduled ZOX:WRUEAVW, July 16th   - Arrive at the [facility] and go to admitting at 7am   - Do not eat or drink anything after midnight the night prior to your procedure.   - Take all your morning medication (except diabetic medications) with a sip of water prior to arrival.  - You will not be able to drive home after your procedure.    - Do NOT miss any doses of your blood thinner - if you should miss a dose please notify our office immediately.   - If you feel as if you go back into normal rhythm prior to scheduled cardioversion, please notify our office immediately.   If your procedure is canceled in the cardioversion suite you will be charged a cancellation fee.

## 2023-05-21 NOTE — Progress Notes (Signed)
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 Recently admitted 6/2-23 for atrial flutter with RVR and tentatively scheduled for DCCV on 05/29/23. Review of notes show it is felt that Anne Anthony is not a good long-term candidate for anticoagulation due to history of frequent falls. Could consider amiodarone if necessary per [name]. Also not a candidate for Watchman device either. Anne Anthony is on Eliquis 2.5 mg BID for a CHADS2VASC score of 5.  On evaluation today, Anne Anthony is currently in atrial flutter. Anne Anthony feels weak and tired when out of rhythm. No missed doses of Eliquis.   Today, Anne Anthony denies symptoms of palpitations, chest pain, shortness of breath, orthopnea, PND, lower extremity edema, dizziness, presyncope, syncope, snoring, daytime somnolence, bleeding, or neurologic sequela. The Anne Anthony is tolerating medications without difficulties and is otherwise without complaint today.    Anne Anthony has a BMI of Body mass index is 18.16 kg/m.Marland Kitchen Filed Weights   05/21/23 1347  Weight: 48 kg    Current Outpatient Medications  Medication Sig Dispense Refill   acetaminophen (TYLENOL) 325 MG tablet Take 1-2 tablets (325-650 mg total) by mouth every 4 (four) hours as needed for fever, headache or mild pain.     apixaban (ELIQUIS) 2.5 MG TABS tablet Take 1 tablet (2.5 mg total) by mouth 2 (two) times daily. 60 tablet 1   dexlansoprazole (DEXILANT) 60 MG capsule Take 1 capsule (60 mg total) by mouth daily. 20 capsule 0   escitalopram (LEXAPRO) 10 MG tablet Take 5  mg by mouth daily.  3   furosemide (LASIX) 40 MG tablet Take 1 tablet (40 mg total) by mouth daily. 30 tablet 1   gabapentin (NEURONTIN) 600 MG tablet Take 0.5 tablets (300 mg total) by mouth 2 (two) times daily.     LUMIGAN 0.01 % SOLN Place 1 drop into both eyes at bedtime.     meclizine (ANTIVERT) 25 MG tablet Take 25 mg by mouth every 6 (six) hours as needed for dizziness.     metoprolol tartrate (LOPRESSOR) 50 MG tablet Take 1 tablet (50 mg total) by mouth 2 (two) times daily. 60 tablet 1   potassium chloride SA (KLOR-CON M) 20 MEQ tablet Take 1 tablet (20 mEq total) by mouth daily. 30 tablet 1   simvastatin (ZOCOR) 20 MG tablet Take 20 mg by mouth in the morning.     trimethoprim (TRIMPEX) 100 MG tablet TAKE 1 TABLET BY MOUTH EVERYDAY AT BEDTIME (Anne Anthony taking differently: Take 100 mg by mouth at bedtime.) 90 tablet 3   No current facility-administered medications for this encounter.    Atrial Fibrillation Management history:  Previous antiarrhythmic drugs: None Previous cardioversions: None - scheduled for 05/29/23 Previous ablations: None Anticoagulation history: Eliquis 2.5 mg BID   ROS- All systems are reviewed and negative except as per the HPI above.  Physical Exam: BP 110/62   Pulse 83   Ht 5\' 4"  (1.626 m)   Wt 48 kg   BMI 18.16 kg/m   GEN: Well nourished, well developed in no acute distress NECK: No JVD; No carotid bruits CARDIAC: Irregularly irregular rate and rhythm, no murmurs,  rubs, gallops RESPIRATORY:  Clear to auscultation without rales, wheezing or rhonchi  ABDOMEN: Soft, non-tender, non-distended EXTREMITIES:  No edema; No deformity   EKG today demonstrates  Vent. rate 83 BPM PR interval * ms QRS duration 90 ms QT/QTcB 366/430 ms P-R-T axes 195 125 66  Suspect arm lead reversal, interpretation assumes no reversal Atrial flutter with variable A-V block with occasional ventricular-paced complexes Possible Right ventricular hypertrophy Lateral  infarct , age undetermined Abnormal ECG When compared with ECG of 03-May-2023 22:22, PREVIOUS ECG IS PRESENT  Echo 05/04/23 demonstrated   1. Left ventricular ejection fraction, by estimation, is 50%. The left  ventricle has low normal function. The left ventricle has no regional wall  motion abnormalities. Left ventricular diastolic parameters are  indeterminate.   2. Right ventricular systolic function is mildly reduced. The right  ventricular size is moderately enlarged. There is normal pulmonary artery  systolic pressure.   3. Left atrial size was moderately dilated.   4. Right atrial size was moderately dilated.   5. The mitral valve is degenerative. Trivial mitral valve regurgitation.  No evidence of mitral stenosis.   6. The aortic valve is grossly normal. Aortic valve regurgitation is not  visualized. No aortic stenosis is present.   7. The inferior vena cava is normal in size with greater than 50%  respiratory variability, suggesting right atrial pressure of 3 mmHg.   ASSESSMENT & PLAN CHA2DS2-VASc Score = 5  The Anne Anthony's score is based upon: CHF History: 1 HTN History: 1 Diabetes History: 0 Stroke History: 0 Vascular Disease History: 0 Age Score: 2 Gender Score: 1       ASSESSMENT AND PLAN: Persistent Atrial Fibrillation (ICD10:  I48.19) / atrial flutter  The Anne Anthony's CHA2DS2-VASc score is 5, indicating a 7.2% annual risk of stroke.    Anne Anthony is in rate controlled atrial flutter. Will proceed with DCCV as scheduled. F/u 1-2 weeks after DCCV.   Repeat Bmet and CBC today. Can consider amiodarone as AAD option if has ERAF.   Secondary Hypercoagulable State (ICD10:  D68.69) The Anne Anthony is at significant risk for stroke/thromboembolism based upon her CHA2DS2-VASc Score of 5.  Continue Apixaban (Eliquis).   No missed doses. Daughter does agree that long term Anne Anthony is not ideal candidate for anticoagulation due to falls.   Follow up 1-2 weeks after  DCCV.   Lake Bells, PA-C  [facility] [address] [place], [place] [address] [phone]

## 2023-05-21 NOTE — H&P (View-Only) (Signed)
 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 Recently admitted 6/2-23 for atrial flutter with RVR and tentatively scheduled for DCCV on 05/29/23. Review of notes show it is felt that patient is not a good long-term candidate for anticoagulation due to history of frequent falls. Could consider amiodarone if necessary per [name]. Also not a candidate for Watchman device either. Patient is on Eliquis 2.5 mg BID for a CHADS2VASC score of 5.  On evaluation today, she is currently in atrial flutter. She feels weak and tired when out of rhythm. No missed doses of Eliquis.   Today, she denies symptoms of palpitations, chest pain, shortness of breath, orthopnea, PND, lower extremity edema, dizziness, presyncope, syncope, snoring, daytime somnolence, bleeding, or neurologic sequela. The patient is tolerating medications without difficulties and is otherwise without complaint today.    she has a BMI of Body mass index is 18.16 kg/m.Marland Kitchen Filed Weights   05/21/23 1347  Weight: 48 kg    Current Outpatient Medications  Medication Sig Dispense Refill   acetaminophen (TYLENOL) 325 MG tablet Take 1-2 tablets (325-650 mg total) by mouth every 4 (four) hours as needed for fever, headache or mild pain.     apixaban (ELIQUIS) 2.5 MG TABS tablet Take 1 tablet (2.5 mg total) by mouth 2 (two) times daily. 60 tablet 1   dexlansoprazole (DEXILANT) 60 MG capsule Take 1 capsule (60 mg total) by mouth daily. 20 capsule 0   escitalopram (LEXAPRO) 10 MG tablet Take 5  mg by mouth daily.  3   furosemide (LASIX) 40 MG tablet Take 1 tablet (40 mg total) by mouth daily. 30 tablet 1   gabapentin (NEURONTIN) 600 MG tablet Take 0.5 tablets (300 mg total) by mouth 2 (two) times daily.     LUMIGAN 0.01 % SOLN Place 1 drop into both eyes at bedtime.     meclizine (ANTIVERT) 25 MG tablet Take 25 mg by mouth every 6 (six) hours as needed for dizziness.     metoprolol tartrate (LOPRESSOR) 50 MG tablet Take 1 tablet (50 mg total) by mouth 2 (two) times daily. 60 tablet 1   potassium chloride SA (KLOR-CON M) 20 MEQ tablet Take 1 tablet (20 mEq total) by mouth daily. 30 tablet 1   simvastatin (ZOCOR) 20 MG tablet Take 20 mg by mouth in the morning.     trimethoprim (TRIMPEX) 100 MG tablet TAKE 1 TABLET BY MOUTH EVERYDAY AT BEDTIME (Patient taking differently: Take 100 mg by mouth at bedtime.) 90 tablet 3   No current facility-administered medications for this encounter.    Atrial Fibrillation Management history:  Previous antiarrhythmic drugs: None Previous cardioversions: None - scheduled for 05/29/23 Previous ablations: None Anticoagulation history: Eliquis 2.5 mg BID   ROS- All systems are reviewed and negative except as per the HPI above.  Physical Exam: BP 110/62   Pulse 83   Ht 5\' 4"  (1.626 m)   Wt 48 kg   BMI 18.16 kg/m   GEN: Well nourished, well developed in no acute distress NECK: No JVD; No carotid bruits CARDIAC: Irregularly irregular rate and rhythm, no murmurs,  rubs, gallops RESPIRATORY:  Clear to auscultation without rales, wheezing or rhonchi  ABDOMEN: Soft, non-tender, non-distended EXTREMITIES:  No edema; No deformity   EKG today demonstrates  Vent. rate 83 BPM PR interval * ms QRS duration 90 ms QT/QTcB 366/430 ms P-R-T axes 195 125 66  Suspect arm lead reversal, interpretation assumes no reversal Atrial flutter with variable A-V block with occasional ventricular-paced complexes Possible Right ventricular hypertrophy Lateral  infarct , age undetermined Abnormal ECG When compared with ECG of 03-May-2023 22:22, PREVIOUS ECG IS PRESENT  Echo 05/04/23 demonstrated   1. Left ventricular ejection fraction, by estimation, is 50%. The left  ventricle has low normal function. The left ventricle has no regional wall  motion abnormalities. Left ventricular diastolic parameters are  indeterminate.   2. Right ventricular systolic function is mildly reduced. The right  ventricular size is moderately enlarged. There is normal pulmonary artery  systolic pressure.   3. Left atrial size was moderately dilated.   4. Right atrial size was moderately dilated.   5. The mitral valve is degenerative. Trivial mitral valve regurgitation.  No evidence of mitral stenosis.   6. The aortic valve is grossly normal. Aortic valve regurgitation is not  visualized. No aortic stenosis is present.   7. The inferior vena cava is normal in size with greater than 50%  respiratory variability, suggesting right atrial pressure of 3 mmHg.   ASSESSMENT & PLAN CHA2DS2-VASc Score = 5  The patient's score is based upon: CHF History: 1 HTN History: 1 Diabetes History: 0 Stroke History: 0 Vascular Disease History: 0 Age Score: 2 Gender Score: 1       ASSESSMENT AND PLAN: Persistent Atrial Fibrillation (ICD10:  I48.19) / atrial flutter  The patient's CHA2DS2-VASc score is 5, indicating a 7.2% annual risk of stroke.    She is in rate controlled atrial flutter. Will proceed with DCCV as scheduled. F/u 1-2 weeks after DCCV.   Repeat Bmet and CBC today. Can consider amiodarone as AAD option if has ERAF.   Secondary Hypercoagulable State (ICD10:  D68.69) The patient is at significant risk for stroke/thromboembolism based upon her CHA2DS2-VASc Score of 5.  Continue Apixaban (Eliquis).   No missed doses. Daughter does agree that long term patient is not ideal candidate for anticoagulation due to falls.   Follow up 1-2 weeks after  DCCV.   Lake Bells, PA-C  [facility] [address] [place], [place] [address] [phone]

## 2023-05-22 DIAGNOSIS — H401132 Primary open-angle glaucoma, bilateral, moderate stage: Secondary | ICD-10-CM | POA: Diagnosis not present

## 2023-05-28 NOTE — Pre-Procedure Instructions (Signed)
Spoke to patient on phone regarding procedure tomorrow. Instructed to arrive at 0645; not to eat nor drink after midnight; confirmed patient has a ride home and has a responsible adult to stay with her for 24 hours after the procedure. Confirmed no missed doses of Eliquis,instructed to take tomorrow morning with a sip of water; to take BP med in the morning with a sip of water.

## 2023-05-28 NOTE — Anesthesia Preprocedure Evaluation (Signed)
Anesthesia Evaluation  Patient identified by MRN, date of birth, ID band Patient awake    Reviewed: Allergy & Precautions, NPO status , Patient's Chart, lab work & pertinent test results  Airway Mallampati: II  TM Distance: >3 FB Neck ROM: Full    Dental  (+) Edentulous Upper, Dental Advisory Given, Edentulous Lower   Pulmonary neg pulmonary ROS   breath sounds clear to auscultation + decreased breath sounds      Cardiovascular hypertension, Pt. on home beta blockers and Pt. on medications +CHF  + pacemaker  Rhythm:Irregular Rate:Normal  Echo 04/2023  1. Left ventricular ejection fraction, by estimation, is 50%. The left ventricle has low normal function. The left ventricle has no regional wall motion abnormalities. Left ventricular diastolic parameters are indeterminate.   2. Right ventricular systolic function is mildly reduced. The right ventricular size is moderately enlarged. There is normal pulmonary artery systolic pressure.   3. Left atrial size was moderately dilated.   4. Right atrial size was moderately dilated.   5. The mitral valve is degenerative. Trivial mitral valve regurgitation. No evidence of mitral stenosis.   6. The aortic valve is grossly normal. Aortic valve regurgitation is not visualized. No aortic stenosis is present.   7. The inferior vena cava is normal in size with greater than 50% respiratory variability, suggesting right atrial pressure of 3 mmHg.    Pacer Interrogated 10/2014 [employer], DDDR. 94% AP-VS, 5% AS-VS, 1% AP-VP  Echo 06/2014 - Left ventricle: The cavity size was normal. Wall thickness wasnormal. Systolic function was normal. The estimated ejectionfraction was in the range of 50% to 55%. Wall motion was normal;there were no regional wall motion abnormalities. Leftventricular diastolic function parameters were normal. - Left atrium: The atrium was mildly dilated. - Pericardium,  extracardiac: A trivial pericardial effusion wasidentified.   Neuro/Psych  PSYCHIATRIC DISORDERS  Depression    negative neurological ROS     GI/Hepatic Neg liver ROS, hiatal hernia,GERD  ,,  Endo/Other  negative endocrine ROS    Renal/GU Renal disease     Musculoskeletal negative musculoskeletal ROS (+)    Abdominal   Peds  Hematology  (+) Blood dyscrasia, anemia   Anesthesia Other Findings   Reproductive/Obstetrics negative OB ROS                             Anesthesia Physical Anesthesia Plan  ASA: 3  Anesthesia Plan: General   Post-op Pain Management: Minimal or no pain anticipated   Induction: Intravenous  PONV Risk Score and Plan: 3 and Treatment may vary due to age or medical condition, Propofol infusion and TIVA  Airway Management Planned: Natural Airway  Additional Equipment:   Intra-op Plan:   Post-operative Plan:   Informed Consent: I have reviewed the patients History and Physical, chart, labs and discussed the procedure including the risks, benefits and alternatives for the proposed anesthesia with the patient or authorized representative who has indicated his/her understanding and acceptance.     Dental advisory given  Plan Discussed with: CRNA  Anesthesia Plan Comments:         Anesthesia Quick Evaluation

## 2023-05-29 ENCOUNTER — Encounter (HOSPITAL_COMMUNITY)
Admission: RE | Disposition: A | Discharge status: Home / Self Care | Payer: Self-pay | Source: Home / Self Care | Attending: Cardiovascular Disease

## 2023-05-29 ENCOUNTER — Encounter (HOSPITAL_COMMUNITY): Payer: Self-pay | Admitting: Cardiovascular Disease

## 2023-05-29 ENCOUNTER — Ambulatory Visit (HOSPITAL_COMMUNITY)
Admission: RE | Admit: 2023-05-29 | Discharge: 2023-05-29 | Disposition: A | Discharge status: Home / Self Care | Payer: Medicare PPO | Attending: Cardiovascular Disease | Admitting: Cardiovascular Disease

## 2023-05-29 ENCOUNTER — Ambulatory Visit (HOSPITAL_BASED_OUTPATIENT_CLINIC_OR_DEPARTMENT_OTHER): Payer: Medicare PPO | Admitting: Anesthesiology

## 2023-05-29 ENCOUNTER — Ambulatory Visit (HOSPITAL_COMMUNITY): Payer: Self-pay | Admitting: Anesthesiology

## 2023-05-29 ENCOUNTER — Other Ambulatory Visit: Payer: Self-pay

## 2023-05-29 DIAGNOSIS — K449 Diaphragmatic hernia without obstruction or gangrene: Secondary | ICD-10-CM | POA: Insufficient documentation

## 2023-05-29 DIAGNOSIS — D6869 Other thrombophilia: Secondary | ICD-10-CM | POA: Insufficient documentation

## 2023-05-29 DIAGNOSIS — Z950 Presence of cardiac pacemaker: Secondary | ICD-10-CM | POA: Diagnosis not present

## 2023-05-29 DIAGNOSIS — Z7901 Long term (current) use of anticoagulants: Secondary | ICD-10-CM | POA: Diagnosis not present

## 2023-05-29 DIAGNOSIS — I4819 Other persistent atrial fibrillation: Secondary | ICD-10-CM | POA: Diagnosis not present

## 2023-05-29 DIAGNOSIS — I130 Hypertensive heart and chronic kidney disease with heart failure and stage 1 through stage 4 chronic kidney disease, or unspecified chronic kidney disease: Secondary | ICD-10-CM

## 2023-05-29 DIAGNOSIS — N1831 Chronic kidney disease, stage 3a: Secondary | ICD-10-CM | POA: Insufficient documentation

## 2023-05-29 DIAGNOSIS — I4892 Unspecified atrial flutter: Secondary | ICD-10-CM | POA: Insufficient documentation

## 2023-05-29 DIAGNOSIS — I495 Sick sinus syndrome: Secondary | ICD-10-CM | POA: Insufficient documentation

## 2023-05-29 DIAGNOSIS — E785 Hyperlipidemia, unspecified: Secondary | ICD-10-CM | POA: Insufficient documentation

## 2023-05-29 DIAGNOSIS — I5032 Chronic diastolic (congestive) heart failure: Secondary | ICD-10-CM | POA: Diagnosis not present

## 2023-05-29 DIAGNOSIS — N189 Chronic kidney disease, unspecified: Secondary | ICD-10-CM

## 2023-05-29 DIAGNOSIS — K219 Gastro-esophageal reflux disease without esophagitis: Secondary | ICD-10-CM | POA: Insufficient documentation

## 2023-05-29 DIAGNOSIS — I509 Heart failure, unspecified: Secondary | ICD-10-CM | POA: Diagnosis not present

## 2023-05-29 HISTORY — PX: CARDIOVERSION: SHX1299

## 2023-05-29 SURGERY — CARDIOVERSION
Anesthesia: General

## 2023-05-29 MED ORDER — SODIUM CHLORIDE 0.9 % IV SOLN: INTRAVENOUS | Status: DC

## 2023-05-29 MED ORDER — LIDOCAINE 2% (20 MG/ML) 5 ML SYRINGE
INTRAMUSCULAR | Status: DC | PRN
  Administered 2023-05-29: 50 mg via INTRAVENOUS

## 2023-05-29 MED ORDER — PROPOFOL 10 MG/ML IV BOLUS
INTRAVENOUS | Status: DC | PRN
  Administered 2023-05-29: 40 mg via INTRAVENOUS

## 2023-05-29 SURGICAL SUPPLY — 1 items: ELECT DEFIB PAD ADLT CADENCE (PAD) ×1 IMPLANT

## 2023-05-29 NOTE — Interval H&P Note (Signed)
History and Physical Interval Note:  05/29/2023 7:36 AM  Anne Anthony  has presented today for surgery, with the diagnosis of afib.  The various methods of treatment have been discussed with the patient and family. After consideration of risks, benefits and other options for treatment, the patient has consented to  Procedure(s): CARDIOVERSION ([name]) as a surgical intervention.  The patient's history has been reviewed, patient examined, no change in status, stable for surgery.  I have reviewed the patient's chart and labs.  Questions were answered to the patient's satisfaction.     Beatric Fulop

## 2023-05-29 NOTE — Op Note (Signed)
Procedure: Electrical Cardioversion Indications:  Atrial Fibrillation  Procedure Details:  Consent: Risks of procedure as well as the alternatives and risks of each were explained to the (patient/caregiver).  Consent for procedure obtained.  Time Out: Verified patient identification, verified procedure, site/side was marked, verified correct patient position, special equipment/implants available, medications/allergies/relevent history reviewed, required imaging and test results available.  Performed  Patient placed on cardiac monitor, pulse oximetry, supplemental oxygen as necessary.  Sedation given:  propofol 50 mg IV Dr. Renold Don Pacer pads placed anterior and posterior chest.  Cardioverted 1 time(s).  Cardioversion with synchronized biphasic 120J shock.  Evaluation: Findings: Post procedure EKG shows:  Atrial paced, ventricular sensed rhythm Complications: None Patient did tolerate procedure well.   Comprehensive device interrogation shows normal pacemaker function after the cardioversion.  Time Spent Directly with the Patient:  30 minutes   Anne Anthony 05/29/2023, 7:55 AM

## 2023-05-29 NOTE — Transfer of Care (Signed)
Immediate Anesthesia Transfer of Care Note  Patient: Anne Anthony  Procedure(s) Performed: CARDIOVERSION  Patient Location: Cath Lab  Anesthesia Type:General  Level of Consciousness: drowsy and patient cooperative  Airway & Oxygen Therapy: Patient Spontanous Breathing and Patient connected to nasal cannula oxygen  Post-op Assessment: Report given to RN and Post -op Vital signs reviewed and stable  Post vital signs: Reviewed and stable  Last Vitals:  Vitals Value Taken Time  BP    Temp    Pulse 77 05/29/23 0747  Resp 22 05/29/23 0747  SpO2 97 % 05/29/23 0747    Last Pain:  Vitals:   05/29/23 0659  TempSrc:   PainSc: 0-No pain         Complications: No notable events documented.

## 2023-05-30 ENCOUNTER — Encounter (HOSPITAL_COMMUNITY): Payer: Self-pay | Admitting: Cardiovascular Disease

## 2023-05-30 NOTE — Anesthesia Postprocedure Evaluation (Signed)
Anesthesia Post Note  Patient: Anne Anthony  Procedure(s) Performed: CARDIOVERSION     Patient location during evaluation: Cath Lab Anesthesia Type: General Level of consciousness: awake and alert Pain management: pain level controlled Vital Signs Assessment: post-procedure vital signs reviewed and stable Respiratory status: spontaneous breathing Cardiovascular status: stable Anesthetic complications: no   No notable events documented.  Last Vitals:  Vitals:   05/29/23 0810 05/29/23 0815  BP: 99/77   Pulse: 70 70  Resp: 18 18  Temp:    SpO2: 100% 100%    Last Pain:  Vitals:   05/29/23 0755  TempSrc: Temporal  PainSc:                  [name]

## 2023-06-07 DIAGNOSIS — I480 Paroxysmal atrial fibrillation: Secondary | ICD-10-CM | POA: Diagnosis not present

## 2023-06-07 DIAGNOSIS — I5030 Unspecified diastolic (congestive) heart failure: Secondary | ICD-10-CM | POA: Diagnosis not present

## 2023-06-07 DIAGNOSIS — N289 Disorder of kidney and ureter, unspecified: Secondary | ICD-10-CM | POA: Diagnosis not present

## 2023-06-12 ENCOUNTER — Encounter (HOSPITAL_COMMUNITY): Payer: Self-pay | Admitting: Internal Medicine

## 2023-06-12 ENCOUNTER — Ambulatory Visit (HOSPITAL_COMMUNITY)
Admission: RE | Admit: 2023-06-12 | Discharge: 2023-06-12 | Disposition: A | Discharge status: Home / Self Care | Payer: Medicare PPO | Source: Ambulatory Visit | Attending: Internal Medicine | Admitting: Internal Medicine

## 2023-06-12 VITALS — BP 114/68 | HR 87 | Ht 64.0 in | Wt 105.4 lb

## 2023-06-12 DIAGNOSIS — N1831 Chronic kidney disease, stage 3a: Secondary | ICD-10-CM | POA: Insufficient documentation

## 2023-06-12 DIAGNOSIS — I4819 Other persistent atrial fibrillation: Secondary | ICD-10-CM | POA: Diagnosis not present

## 2023-06-12 DIAGNOSIS — Z79899 Other long term (current) drug therapy: Secondary | ICD-10-CM | POA: Insufficient documentation

## 2023-06-12 DIAGNOSIS — I480 Paroxysmal atrial fibrillation: Secondary | ICD-10-CM | POA: Diagnosis not present

## 2023-06-12 DIAGNOSIS — D6869 Other thrombophilia: Secondary | ICD-10-CM | POA: Insufficient documentation

## 2023-06-12 DIAGNOSIS — I4892 Unspecified atrial flutter: Secondary | ICD-10-CM

## 2023-06-12 DIAGNOSIS — Z7901 Long term (current) use of anticoagulants: Secondary | ICD-10-CM | POA: Insufficient documentation

## 2023-06-12 DIAGNOSIS — E785 Hyperlipidemia, unspecified: Secondary | ICD-10-CM | POA: Diagnosis not present

## 2023-06-12 DIAGNOSIS — I129 Hypertensive chronic kidney disease with stage 1 through stage 4 chronic kidney disease, or unspecified chronic kidney disease: Secondary | ICD-10-CM | POA: Insufficient documentation

## 2023-06-12 MED ORDER — AMIODARONE HCL 200 MG PO TABS: ORAL_TABLET | ORAL | 2 refills | Status: DC

## 2023-06-12 NOTE — Progress Notes (Signed)
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 female with a history of chronic diastolic heart failure, reduced systolic LV function, diaphragmatic hernia, GERD, HTN, CKD stage IIIa, dyslipidemia, tachybradycardia syndrome s/p pacemaker implantation, and persistent atrial fibrillation who presents for consultation in the [facility]. Recently admitted 6/2-23 for atrial flutter with RVR and tentatively scheduled for DCCV on 05/29/23. Review of notes show it is felt that patient is not a good long-term candidate for anticoagulation due to history of frequent falls. Could consider amiodarone if necessary per [name]. Also not a candidate for Watchman device either. Patient is on Eliquis 2.5 mg BID for a CHADS2VASC score of 5.  On evaluation today, she is currently in atrial flutter. She feels weak and tired when out of rhythm. No missed doses of Eliquis.   On follow up 06/12/23, she is currently in rate controlled atrial flutter. She is s/p successful DCCV on 05/29/23. She has not missed any doses of Eliquis.  Today, she denies symptoms of palpitations, chest pain, shortness of breath, orthopnea, PND, lower extremity edema, dizziness, presyncope, syncope, snoring, daytime somnolence, bleeding, or neurologic sequela. The patient is tolerating medications without difficulties and is otherwise without complaint today.    she has a BMI of Body mass index is 18.09 kg/m.Marland Kitchen Filed Weights   06/12/23 1322  Weight: 47.8 kg     Current Outpatient Medications  Medication Sig Dispense Refill   acetaminophen (TYLENOL) 325 MG tablet Take 1-2 tablets (325-650 mg total) by mouth every 4 (four) hours as needed for fever, headache or mild pain.     apixaban (ELIQUIS) 2.5 MG TABS tablet Take 1 tablet (2.5 mg total) by mouth 2 (two) times daily. 60  tablet 1   dexlansoprazole (DEXILANT) 60 MG capsule Take 1 capsule (60 mg total) by mouth daily. 20 capsule 0   escitalopram (LEXAPRO) 10 MG tablet Take 5 mg by mouth daily.  3   furosemide (LASIX) 40 MG tablet Take 1 tablet (40 mg total) by mouth daily. 30 tablet 1   gabapentin (NEURONTIN) 600 MG tablet Take 0.5 tablets (300 mg total) by mouth 2 (two) times daily.     LUMIGAN 0.01 % SOLN Place 1 drop into both eyes at bedtime.     meclizine (ANTIVERT) 25 MG tablet Take 25 mg by mouth every 6 (six) hours as needed for dizziness.     metoprolol tartrate (LOPRESSOR) 50 MG tablet Take 1 tablet (50 mg total) by mouth 2 (two) times daily. 60 tablet 1   potassium chloride SA (KLOR-CON M) 20 MEQ tablet Take 1 tablet (20 mEq total) by mouth daily. 30 tablet 1   simvastatin (ZOCOR) 20 MG tablet Take 20 mg by mouth in the morning.     trimethoprim (TRIMPEX) 100 MG tablet TAKE 1 TABLET BY MOUTH EVERYDAY AT BEDTIME (Patient taking differently: Take 100 mg by mouth at bedtime.) 90 tablet 3   No current facility-administered medications for this encounter.    Atrial Fibrillation Management history:  Previous antiarrhythmic drugs: None Previous cardioversions: 05/29/23 Previous ablations: None Anticoagulation history: Eliquis 2.5 mg BID   ROS- All systems are reviewed and negative except as per the HPI above.  Physical Exam: Ht 5\' 4"  (1.626 m)   Wt 47.8 kg   BMI 18.09 kg/m   GEN- The patient is well appearing, alert  and oriented x 3 today.   Neck - no JVD or carotid bruit noted Lungs- Clear to ausculation bilaterally, normal work of breathing Heart- Irregular rate and rhythm, no murmurs, rubs or gallops, PMI not laterally displaced Extremities- no clubbing, cyanosis, or edema Skin - no rash or ecchymosis noted  EKG today demonstrates  Vent. rate 87 BPM PR interval * ms QRS duration 86 ms QT/QTcB 350/421 ms P-R-T axes * 3 92 Atrial flutter with variable A-V block with premature ventricular  or aberrantly conducted complexes Abnormal ECG When compared with ECG of 29-May-2023 07:51, PREVIOUS ECG IS PRESENT  Echo 05/04/23 demonstrated   1. Left ventricular ejection fraction, by estimation, is 50%. The left  ventricle has low normal function. The left ventricle has no regional wall  motion abnormalities. Left ventricular diastolic parameters are  indeterminate.   2. Right ventricular systolic function is mildly reduced. The right  ventricular size is moderately enlarged. There is normal pulmonary artery  systolic pressure.   3. Left atrial size was moderately dilated.   4. Right atrial size was moderately dilated.   5. The mitral valve is degenerative. Trivial mitral valve regurgitation.  No evidence of mitral stenosis.   6. The aortic valve is grossly normal. Aortic valve regurgitation is not  visualized. No aortic stenosis is present.   7. The inferior vena cava is normal in size with greater than 50%  respiratory variability, suggesting right atrial pressure of 3 mmHg.   ASSESSMENT & PLAN CHA2DS2-VASc Score = 5  The patient's score is based upon: CHF History: 1 HTN History: 1 Diabetes History: 0 Stroke History: 0 Vascular Disease History: 0 Age Score: 2 Gender Score: 1       ASSESSMENT AND PLAN: Persistent Atrial Fibrillation (ICD10:  I48.19) / atrial flutter  The patient's CHA2DS2-VASc score is 5, indicating a 7.2% annual risk of stroke.   S/p successful DCCV on 05/29/23.   She is currently in atrial flutter. After discussion with patient and daughter, will begin amiodarone load 200 mg BID x 2 weeks, then 200 mg daily. Recheck ECG in 2-3 weeks.   Tikosyn is not indicated due to CrCl estime of 16 mL/min (from 05/21/23 Bmet).   Secondary Hypercoagulable State (ICD10:  D68.69) The patient is at significant risk for stroke/thromboembolism based upon her CHA2DS2-VASc Score of 5.  Continue Apixaban (Eliquis).   No missed doses. Daughter does agree that long term  patient is not ideal candidate for anticoagulation due to falls.   Repeat ECG in 2-3 weeks.    Lake Bells, PA-C  [facility] [address] [place], [place] [address] [phone]

## 2023-06-20 ENCOUNTER — Other Ambulatory Visit: Payer: Self-pay

## 2023-06-20 ENCOUNTER — Emergency Department (HOSPITAL_COMMUNITY)
Admission: EM | Admit: 2023-06-20 | Discharge: 2023-06-20 | Disposition: A | Discharge status: Home / Self Care | Payer: Medicare PPO | Attending: Student | Admitting: Student

## 2023-06-20 ENCOUNTER — Emergency Department (HOSPITAL_COMMUNITY): Payer: Medicare PPO

## 2023-06-20 DIAGNOSIS — S0990XA Unspecified injury of head, initial encounter: Secondary | ICD-10-CM

## 2023-06-20 DIAGNOSIS — M5030 Other cervical disc degeneration, unspecified cervical region: Secondary | ICD-10-CM | POA: Diagnosis not present

## 2023-06-20 DIAGNOSIS — M4802 Spinal stenosis, cervical region: Secondary | ICD-10-CM | POA: Diagnosis not present

## 2023-06-20 DIAGNOSIS — W01198A Fall on same level from slipping, tripping and stumbling with subsequent striking against other object, initial encounter: Secondary | ICD-10-CM | POA: Insufficient documentation

## 2023-06-20 DIAGNOSIS — S0181XA Laceration without foreign body of other part of head, initial encounter: Secondary | ICD-10-CM | POA: Diagnosis not present

## 2023-06-20 DIAGNOSIS — Z7901 Long term (current) use of anticoagulants: Secondary | ICD-10-CM | POA: Diagnosis not present

## 2023-06-20 DIAGNOSIS — Z79899 Other long term (current) drug therapy: Secondary | ICD-10-CM | POA: Insufficient documentation

## 2023-06-20 DIAGNOSIS — S199XXA Unspecified injury of neck, initial encounter: Secondary | ICD-10-CM | POA: Diagnosis not present

## 2023-06-20 DIAGNOSIS — Y92512 Supermarket, store or market as the place of occurrence of the external cause: Secondary | ICD-10-CM | POA: Diagnosis not present

## 2023-06-20 DIAGNOSIS — I10 Essential (primary) hypertension: Secondary | ICD-10-CM | POA: Diagnosis not present

## 2023-06-20 DIAGNOSIS — R220 Localized swelling, mass and lump, head: Secondary | ICD-10-CM | POA: Diagnosis not present

## 2023-06-20 MED ORDER — ACETAMINOPHEN 325 MG PO TABS
650.0000 mg | ORAL_TABLET | Freq: Once | ORAL | Status: AC
  Administered 2023-06-20: 650 mg via ORAL
  Filled 2023-06-20: qty 2

## 2023-06-20 MED ORDER — DOUBLE ANTIBIOTIC 500-10000 UNIT/GM EX OINT
TOPICAL_OINTMENT | Freq: Once | CUTANEOUS | Status: AC
  Administered 2023-06-20: 1 via TOPICAL
  Filled 2023-06-20: qty 1

## 2023-06-20 MED ORDER — LIDOCAINE HCL (PF) 1 % IJ SOLN
30.0000 mL | Freq: Once | INTRAMUSCULAR | Status: AC
  Administered 2023-06-20: 30 mL
  Filled 2023-06-20: qty 30
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
## 2023-06-20 NOTE — ED Notes (Signed)
Suture area cleaned and ointment applied. Tefla dressing and gauze used to apply pressure to site per provider

## 2023-06-20 NOTE — Discharge Instructions (Addendum)
Pleasure taking care of you today.  Your head CT and CT spine CT were reassuring.  You have a hematoma to your forehead and a laceration.  Be repaired laceration with sutures.  Keep this clean and dry and you can apply antibiotic when daily.  Have the sutures removed about 5 days.  If you develop redness, fever drainage or increased pain come back for reevaluation.  Come back if you have severe headache, vomiting, confusion or other worsening symptoms as well.

## 2023-06-20 NOTE — ED Triage Notes (Signed)
Pt tripped fell at grocery store. Pt has c-collar in place.

## 2023-06-29 ENCOUNTER — Ambulatory Visit
Admission: RE | Admit: 2023-06-29 | Discharge: 2023-06-29 | Disposition: A | Discharge status: Home / Self Care | Payer: Medicare PPO | Source: Ambulatory Visit | Attending: Family Medicine | Admitting: Family Medicine

## 2023-06-29 VITALS — BP 108/61 | HR 88 | Temp 97.8°F | Resp 16

## 2023-06-29 DIAGNOSIS — Z4802 Encounter for removal of sutures: Secondary | ICD-10-CM

## 2023-06-29 NOTE — Discharge Instructions (Signed)
Continue to keep the area clean and dry. May continue use of ice to help with continued swelling. May apply Neosporin or another topical antibiotic ointment to the area while the area continues to heal. Follow-up as needed.

## 2023-06-29 NOTE — ED Triage Notes (Signed)
Pt presents to UC for a suture removal.    Was supposed to get them removed after 5 days but did not due to bleeding.   Pt denies any pain or fever.

## 2023-06-29 NOTE — ED Notes (Signed)
Applied a small guaze with mupirocin ointment to patients head as she is on blood thinners. Advised patient she can take it off when she gets home providing there is no blood.

## 2023-06-29 NOTE — ED Provider Notes (Signed)
[facility]    CSN: [id number] Arrival date & time: 06/29/23  1243      History   Chief Complaint Chief Complaint  Patient presents with   Suture / Staple Removal    Entered by patient    HPI Anne Anthony is a 87 y.o. female.   The history is provided by the patient.   The patient presents for suture removal after a head injury suffered on 06/20/2023.  Patient had 7 sutures placed to the right forehead on 06/20/2023.  Patient denies fever, chills, foul-smelling drainage, increased redness, increased swelling, or erythema.  Patient continues to have a small hematoma underneath the laceration.  Past Medical History:  Diagnosis Date   Atrial flutter (HCC)    successful radiofrequency ablation 2007   Diverticula of colon    Family history of colon cancer    brother   Hiatal hernia    Hyperlipidemia    Hypertension    Pacemaker    Paroxysmal atrial fibrillation (HCC)    Severe protein-calorie malnutrition (HCC) 11/17/2014   Tachycardia-bradycardia syndrome (HCC)    permament pacermaker, dual chamber Medtronic on 06/2011   Tubular adenoma     Patient Active Problem List   Diagnosis Date Noted   Hypercoagulable state due to persistent atrial fibrillation (HCC) 05/21/2023   Atrial flutter (HCC) 05/06/2023   New onset of congestive heart failure (HCC) 05/04/2023   Depression 05/04/2023   Peripheral neuropathy 05/04/2023   Acute kidney injury superimposed on chronic kidney disease (HCC) 05/04/2023   AKI (acute kidney injury) (HCC) 05/04/2023   Supratherapeutic INR    SAH (subarachnoid hemorrhage) (HCC) 01/31/2018   Pleural effusion on left 01/31/2018   Restrictive lung disease 05/24/2017   Restrictive ventilatory defect 04/28/2016   Nasal congestion 12/01/2014   Postoperative anemia due to acute blood loss 11/23/2014   Protein-calorie malnutrition, severe (HCC) 11/17/2014   Severe protein-calorie malnutrition (HCC) 11/17/2014   Anticoagulated on Coumadin     Left displaced femoral neck fracture (HCC)    Hip fracture (HCC) 11/14/2014   Closed left hip fracture (HCC) 11/14/2014   Back pain 05/14/2014   Paroxysmal atrial fibrillation with RVR (HCC)    Tachycardia-bradycardia syndrome (HCC)    Essential hypertension    Dyslipidemia    Pacemaker    Persistent atrial fibrillation (HCC) 01/19/2013   GERD without esophagitis 08/17/2010    Past Surgical History:  Procedure Laterality Date   ABDOMINAL SURGERY     CARDIOVERSION [name] 05/29/2023   Procedure: CARDIOVERSION;  Surgeon: Thurmon Fair, MD;  Location: [facility];  Service: Cardiovascular;  Laterality: [name];   COLONOSCOPY  08/17/2010   Dr. Jena Gauss- internal hemorrhoids, o/w normal rectum, L sided diverticula, tubular adenoma   ESOPHAGOGASTRODUODENOSCOPY  10/502011   Dr. Jena Gauss- huge diaphragmatic/hiatial hernia, fundal gland polyps not manipulated o/w noral appearing gastric mucosa, patent pylorus, normal D1 & D2   HIATAL HERNIA REPAIR  2005   HIP ARTHROPLASTY Left 11/16/2014   Procedure: ARTHROPLASTY BIPOLAR HIP;  Surgeon: Shelda Pal, MD;  Location: [facility];  Service: Orthopedics;  Laterality: Left;   PARTIAL HYSTERECTOMY     permament pacemaker  06/2011   dual chamber , Medtronic Adapta serial # [id number]  last checked 02/05/2013   RADIOFREQUENCY ABLATION  06/28/2006    OB History   No obstetric history on file.      Home Medications    Prior to Admission medications   Medication Sig Start Date End Date Taking? Authorizing Provider  acetaminophen (TYLENOL) 325 MG tablet Take 1-2 tablets (325-650 mg total) by mouth every 4 (four) hours as needed for fever, headache or mild pain. 11/18/14   Lanney Gins, PA-C  amiodarone (PACERONE) 200 MG tablet Take 1 tablet by mouth twice daily x 2 weeks then 200 mg by mouth daily 06/12/23   Eustace Pen, PA-C  apixaban (ELIQUIS) 2.5 MG TABS tablet Take 1 tablet (2.5 mg total) by mouth 2 (two) times daily. 05/06/23   Rodolph Bong,  MD  dexlansoprazole (DEXILANT) 60 MG capsule Take 1 capsule (60 mg total) by mouth daily. 09/14/17   Croitoru, Mihai, MD  escitalopram (LEXAPRO) 10 MG tablet Take 5 mg by mouth daily. 09/06/18   [provider]  furosemide (LASIX) 40 MG tablet Take 1 tablet (40 mg total) by mouth daily. 05/07/23   Rodolph Bong, MD  gabapentin (NEURONTIN) 600 MG tablet Take 0.5 tablets (300 mg total) by mouth 2 (two) times daily. 05/06/23   Rodolph Bong, MD  LUMIGAN 0.01 % SOLN Place 1 drop into both eyes at bedtime. 04/16/22   [provider]  meclizine (ANTIVERT) 25 MG tablet Take 25 mg by mouth every 6 (six) hours as needed for dizziness. 06/16/21   [provider]  metoprolol tartrate (LOPRESSOR) 50 MG tablet Take 1 tablet (50 mg total) by mouth 2 (two) times daily. 05/06/23   Rodolph Bong, MD  potassium chloride SA (KLOR-CON M) 20 MEQ tablet Take 1 tablet (20 mEq total) by mouth daily. 05/06/23   Rodolph Bong, MD  simvastatin (ZOCOR) 20 MG tablet Take 20 mg by mouth in the morning.    [provider]  trimethoprim (TRIMPEX) 100 MG tablet TAKE 1 TABLET BY MOUTH EVERYDAY AT BEDTIME Patient taking differently: Take 100 mg by mouth at bedtime. 02/06/23   McKenzie, Mardene Celeste, MD    Family History Family History  Problem Relation Age of Onset   Diabetes Mother    Heart disease Mother     Social History Social History   Tobacco Use   Smoking status: Never   Smokeless tobacco: Never  Vaping Use   Vaping status: Never Used  Substance Use Topics   Alcohol use: No   Drug use: No     Allergies   Morphine and codeine   Review of Systems Review of Systems Per HPI  Physical Exam Triage Vital Signs ED Triage Vitals  Encounter Vitals Group     BP 06/29/23 1252 108/61     Systolic BP Percentile --      Diastolic BP Percentile --      Pulse Rate 06/29/23 1252 88     Resp 06/29/23 1252 16     Temp 06/29/23 1252 97.8 F (36.6 C)     Temp Source  06/29/23 1252 Oral     SpO2 06/29/23 1252 92 %     Weight --      Height --      Head Circumference --      Peak Flow --      Pain Score 06/29/23 1255 0     Pain Loc --      Pain Education --      Exclude from Growth Chart --    No data found.  Updated Vital Signs BP 108/61 (BP Location: Right Arm)   Pulse 88   Temp 97.8 F (36.6 C) (Oral)   Resp 16   SpO2 92%   Visual Acuity Right Eye Distance:  Left Eye Distance:   Bilateral Distance:    Right Eye Near:   Left Eye Near:    Bilateral Near:     Physical Exam Vitals and nursing note reviewed.  Constitutional:      General: She is not in acute distress.    Appearance: Normal appearance.  Eyes:     Extraocular Movements: Extraocular movements intact.     Pupils: Pupils are equal, round, and reactive to light.  Pulmonary:     Effort: Pulmonary effort is normal.  Musculoskeletal:     Cervical back: Normal range of motion.  Skin:    General: Skin is warm and dry.       Neurological:     General: No focal deficit present.     Mental Status: She is alert and oriented to person, place, and time.  Psychiatric:        Mood and Affect: Mood normal.        Behavior: Behavior normal.      UC Treatments / Results  Labs (all labs ordered are listed, but only abnormal results are displayed) Labs Reviewed - No data to display  EKG   Radiology No results found.  Procedures Procedures (including critical care time)  Medications Ordered in UC Medications - No data to display  Initial Impression / Assessment and Plan / UC Course  I have reviewed the triage vital signs and the nursing notes.  Pertinent labs & imaging results that were available during my care of the patient were reviewed by me and considered in my medical decision making (see chart for details).  The patient is well-appearing, she is in no acute distress, vital signs are stable.  7 sutures were removed from the forehead laceration.  Patient  tolerated well.  Patient advised to keep the area clean and dry, continue to apply ice, and over-the-counter Tylenol for pain or discomfort.  Patient's family verbalized understanding, all questions were answered.  Patient stable for discharge.   Final Clinical Impressions(s) / UC Diagnoses   Final diagnoses:  Visit for suture removal     Discharge Instructions      Continue to keep the area clean and dry. May continue use of ice to help with continued swelling. May apply Neosporin or another topical antibiotic ointment to the area while the area continues to heal. Follow-up as needed.     ED Prescriptions   None    PDMP not reviewed this encounter.   Abran Cantor, NP 06/29/23 2019

## 2023-07-03 ENCOUNTER — Ambulatory Visit (HOSPITAL_COMMUNITY)
Admission: RE | Admit: 2023-07-03 | Discharge: 2023-07-03 | Disposition: A | Discharge status: Home / Self Care | Payer: Medicare PPO | Source: Ambulatory Visit | Attending: Internal Medicine | Admitting: Internal Medicine

## 2023-07-03 DIAGNOSIS — I480 Paroxysmal atrial fibrillation: Secondary | ICD-10-CM | POA: Diagnosis not present

## 2023-07-03 DIAGNOSIS — Z79899 Other long term (current) drug therapy: Secondary | ICD-10-CM | POA: Diagnosis not present

## 2023-07-03 DIAGNOSIS — R9431 Abnormal electrocardiogram [ECG] [EKG]: Secondary | ICD-10-CM | POA: Insufficient documentation

## 2023-07-03 MED ORDER — AMIODARONE HCL 200 MG PO TABS: 200.0000 mg | ORAL_TABLET | Freq: Every day | ORAL | 1 refills | Status: DC

## 2023-07-03 NOTE — Progress Notes (Signed)
Patient began amiodarone load on 06/12/23. She is here today for 2 week ECG recheck.   ECG today shows  Vent. rate 76 BPM PR interval 240 ms QRS duration 150 ms QT/QTcB 444/499 ms P-R-T axes * -15 115 Atrial-paced rhythm with prolonged AV conduction Left ventricular hypertrophy with QRS widening and repolarization abnormality ( R in aVL , Cornell product ) Abnormal ECG When compared with ECG of 12-Jun-2023 13:37, PREVIOUS ECG IS PRESENT   Pt will follow up as scheduled with [name] in November. F/u Afib clinic February.

## 2023-07-06 ENCOUNTER — Other Ambulatory Visit (HOSPITAL_COMMUNITY): Payer: Self-pay | Admitting: *Deleted

## 2023-07-06 MED ORDER — APIXABAN 2.5 MG PO TABS: 2.5000 mg | ORAL_TABLET | Freq: Two times a day (BID) | ORAL | 6 refills | Status: DC

## 2023-07-09 ENCOUNTER — Telehealth: Payer: Self-pay | Admitting: Cardiovascular Disease

## 2023-07-09 MED ORDER — POTASSIUM CHLORIDE CRYS ER 20 MEQ PO TBCR: 20.0000 meq | EXTENDED_RELEASE_TABLET | Freq: Every day | ORAL | 3 refills | Status: DC

## 2023-07-09 MED ORDER — FUROSEMIDE 40 MG PO TABS: 40.0000 mg | ORAL_TABLET | Freq: Every day | ORAL | 3 refills | Status: DC

## 2023-07-09 NOTE — Telephone Encounter (Signed)
Refill for Lasix & Potassium have been sent to [facility].

## 2023-07-09 NOTE — Telephone Encounter (Signed)
*  STAT* If patient is at the pharmacy, call can be transferred to refill team.   1. Which medications need to be refilled? (please list name of each medication and dose if known) furosemide (LASIX) 40 MG tablet    potassium chloride SA (KLOR-CON M) 20 MEQ tablet   2. Would you like to learn more about the convenience, safety, & potential cost savings by using the [facility]?  Yes  3. Are you open to using the [facility] (Type [facility]. Yes  4. Which pharmacy/location (including street and city if local pharmacy) is medication to be sent to? [id number] - [place],  - [address]    5. Do they need a 30 day or 90 day supply? 30 Day Supply  Pt is currently out medications.

## 2023-08-27 ENCOUNTER — Telehealth: Payer: Self-pay | Admitting: Physician Assistant

## 2023-08-27 ENCOUNTER — Telehealth: Payer: Self-pay | Admitting: Cardiovascular Disease

## 2023-08-27 DIAGNOSIS — I480 Paroxysmal atrial fibrillation: Secondary | ICD-10-CM

## 2023-08-27 MED ORDER — METOPROLOL TARTRATE 50 MG PO TABS
50.0000 mg | ORAL_TABLET | Freq: Two times a day (BID) | ORAL | 3 refills | Status: DC
Start: 2023-08-27 — End: 2023-08-28

## 2023-08-27 NOTE — Telephone Encounter (Signed)
Pt daughter called stating she has contacted our office three times to get a refill of metoprolol and has not had a call back. I have refilled that medication.

## 2023-08-27 NOTE — Telephone Encounter (Signed)
*  STAT* If patient is at the pharmacy, call can be transferred to refill team.   1. Which medications need to be refilled? (please list name of each medication and dose if known) metoprolol tartrate (LOPRESSOR) 50 MG tablet  2. Which pharmacy/location (including street and city if local pharmacy) is medication to be sent to? [id number] - [place], [place] - [address]  3. Do they need a 30 day or 90 day supply?   90 day supply  Patient's daughter states patient is completely out of medication.

## 2023-08-27 NOTE — Telephone Encounter (Signed)
Daughter stated patient has been out of this medication since Friday, 10/11.

## 2023-08-28 ENCOUNTER — Other Ambulatory Visit: Payer: Self-pay

## 2023-08-28 DIAGNOSIS — I480 Paroxysmal atrial fibrillation: Secondary | ICD-10-CM

## 2023-08-28 MED ORDER — METOPROLOL TARTRATE 50 MG PO TABS
50.0000 mg | ORAL_TABLET | Freq: Two times a day (BID) | ORAL | 1 refills | Status: DC
Start: 2023-08-28 — End: 2023-10-04

## 2023-09-30 ENCOUNTER — Emergency Department (HOSPITAL_COMMUNITY): Payer: Medicare PPO

## 2023-09-30 ENCOUNTER — Other Ambulatory Visit: Payer: Self-pay

## 2023-09-30 ENCOUNTER — Emergency Department (HOSPITAL_COMMUNITY)
Admission: EM | Admit: 2023-09-30 | Discharge: 2023-09-30 | Disposition: A | Discharge status: Home / Self Care | Payer: Medicare PPO | Attending: Emergency Medicine | Admitting: Emergency Medicine

## 2023-09-30 ENCOUNTER — Encounter (HOSPITAL_COMMUNITY): Payer: Self-pay

## 2023-09-30 DIAGNOSIS — Z950 Presence of cardiac pacemaker: Secondary | ICD-10-CM | POA: Diagnosis not present

## 2023-09-30 DIAGNOSIS — W19XXXA Unspecified fall, initial encounter: Secondary | ICD-10-CM

## 2023-09-30 DIAGNOSIS — Z7901 Long term (current) use of anticoagulants: Secondary | ICD-10-CM | POA: Diagnosis not present

## 2023-09-30 DIAGNOSIS — D1809 Hemangioma of other sites: Secondary | ICD-10-CM | POA: Diagnosis not present

## 2023-09-30 DIAGNOSIS — M25512 Pain in left shoulder: Secondary | ICD-10-CM | POA: Diagnosis not present

## 2023-09-30 DIAGNOSIS — S199XXA Unspecified injury of neck, initial encounter: Secondary | ICD-10-CM | POA: Diagnosis not present

## 2023-09-30 DIAGNOSIS — Z79899 Other long term (current) drug therapy: Secondary | ICD-10-CM | POA: Insufficient documentation

## 2023-09-30 DIAGNOSIS — W182XXA Fall in (into) shower or empty bathtub, initial encounter: Secondary | ICD-10-CM | POA: Insufficient documentation

## 2023-09-30 DIAGNOSIS — S0003XA Contusion of scalp, initial encounter: Secondary | ICD-10-CM | POA: Diagnosis not present

## 2023-09-30 DIAGNOSIS — I10 Essential (primary) hypertension: Secondary | ICD-10-CM | POA: Insufficient documentation

## 2023-09-30 DIAGNOSIS — S0990XA Unspecified injury of head, initial encounter: Secondary | ICD-10-CM | POA: Diagnosis not present

## 2023-09-30 DIAGNOSIS — M25519 Pain in unspecified shoulder: Secondary | ICD-10-CM | POA: Diagnosis not present

## 2023-09-30 DIAGNOSIS — G238 Other specified degenerative diseases of basal ganglia: Secondary | ICD-10-CM | POA: Diagnosis not present

## 2023-09-30 DIAGNOSIS — R58 Hemorrhage, not elsewhere classified: Secondary | ICD-10-CM | POA: Diagnosis not present

## 2023-09-30 DIAGNOSIS — D1802 Hemangioma of intracranial structures: Secondary | ICD-10-CM | POA: Diagnosis not present

## 2023-09-30 DIAGNOSIS — J9811 Atelectasis: Secondary | ICD-10-CM | POA: Diagnosis not present

## 2023-09-30 DIAGNOSIS — I4891 Unspecified atrial fibrillation: Secondary | ICD-10-CM | POA: Diagnosis not present

## 2023-09-30 NOTE — ED Provider Notes (Signed)
[facility] Provider Note   CSN: [id number] Arrival date & time: 09/30/23  6440     History  Chief Complaint  Patient presents with   Marletta Lor    Anne Anthony is a 87 y.o. female.   Fall  Patient presents after fall.  On anticoagulation for A-fib.  Reportedly fell in the bathtub.  Complaining of pain in the back of her head and left shoulder.  No loss conscious.  No chest or abdominal pain.    Past Medical History:  Diagnosis Date   Atrial flutter (HCC)    successful radiofrequency ablation 2007   Diverticula of colon    Family history of colon cancer    brother   Hiatal hernia    Hyperlipidemia    Hypertension    Pacemaker    Paroxysmal atrial fibrillation (HCC)    Severe protein-calorie malnutrition (HCC) 11/17/2014   Tachycardia-bradycardia syndrome (HCC)    permament pacermaker, dual chamber Medtronic on 06/2011   Tubular adenoma     Home Medications Prior to Admission medications   Medication Sig Start Date End Date Taking? Authorizing Provider  acetaminophen (TYLENOL) 325 MG tablet Take 1-2 tablets (325-650 mg total) by mouth every 4 (four) hours as needed for fever, headache or mild pain. 11/18/14   Lanney Gins, PA-C  amiodarone (PACERONE) 200 MG tablet Take 1 tablet (200 mg total) by mouth daily. 07/03/23   Eustace Pen, PA-C  apixaban (ELIQUIS) 2.5 MG TABS tablet Take 1 tablet (2.5 mg total) by mouth 2 (two) times daily. 07/06/23   Eustace Pen, PA-C  dexlansoprazole (DEXILANT) 60 MG capsule Take 1 capsule (60 mg total) by mouth daily. 09/14/17   Croitoru, Mihai, MD  escitalopram (LEXAPRO) 10 MG tablet Take 5 mg by mouth daily. 09/06/18   [provider]  furosemide (LASIX) 40 MG tablet Take 1 tablet (40 mg total) by mouth daily. 07/09/23   Croitoru, Mihai, MD  gabapentin (NEURONTIN) 600 MG tablet Take 0.5 tablets (300 mg total) by mouth 2 (two) times daily. 05/06/23   Rodolph Bong, MD  LUMIGAN  0.01 % SOLN Place 1 drop into both eyes at bedtime. 04/16/22   [provider]  meclizine (ANTIVERT) 25 MG tablet Take 25 mg by mouth every 6 (six) hours as needed for dizziness. 06/16/21   [provider]  metoprolol tartrate (LOPRESSOR) 50 MG tablet Take 1 tablet (50 mg total) by mouth 2 (two) times daily. 08/28/23   Croitoru, Mihai, MD  potassium chloride SA (KLOR-CON M) 20 MEQ tablet Take 1 tablet (20 mEq total) by mouth daily. 07/09/23   Croitoru, Mihai, MD  simvastatin (ZOCOR) 20 MG tablet Take 20 mg by mouth in the morning.    [provider]  trimethoprim (TRIMPEX) 100 MG tablet TAKE 1 TABLET BY MOUTH EVERYDAY AT BEDTIME Patient taking differently: Take 100 mg by mouth at bedtime. 02/06/23   McKenzie, Mardene Celeste, MD      Allergies    Morphine and codeine    Review of Systems   Review of Systems  Physical Exam Updated Vital Signs BP 130/63   Pulse 70   Temp 98.1 F (36.7 C) (Oral)   Resp 17   Ht 5\' 4"  (1.626 m)   Wt 49.7 kg   SpO2 96%   BMI 18.80 kg/m  Physical Exam Vitals reviewed.  HENT:     Head:     Comments: Occipital hematoma.  Mild oozing but no  frank laceration. Eyes:     Pupils: Pupils are equal, round, and reactive to light.  Chest:     Chest wall: No tenderness.  Abdominal:     Tenderness: There is no abdominal tenderness.  Musculoskeletal:        General: Tenderness present.     Cervical back: Neck supple. No tenderness.     Comments: Mild tenderness over the left trapezius area.  Good range of motion of left shoulder.  No tenderness over lateral shoulder.  Skin:    Capillary Refill: Capillary refill takes less than 2 seconds.  Neurological:     Mental Status: Anne Anthony is alert and oriented to person, place, and time.     ED Results / Procedures / Treatments   Labs (all labs ordered are listed, but only abnormal results are displayed) Labs Reviewed - No data to display  EKG None  Radiology DG Chest Portable 1 View  Result  Date: 09/30/2023 CLINICAL DATA:  Recent fall.  Atrial fibrillation. EXAM: PORTABLE CHEST 1 VIEW COMPARISON:  05/03/2023 FINDINGS: Large hiatal or diaphragmatic hernia is seen containing stomach and bowel loops, without change. Left basilar atelectasis is again seen. No evidence of pulmonary consolidation. Heart size is stable. Permanent pacemaker remains in appropriate position. IMPRESSION: Large hiatal or diaphragmatic hernia, without change. Left basilar atelectasis. Electronically Signed   By: Danae Orleans M.D.   On: 09/30/2023 10:16   CT Head Wo Contrast  Result Date: 09/30/2023 CLINICAL DATA:  Fall getting into tub with posterior head injury. Neck and shoulder pain. EXAM: CT HEAD WITHOUT CONTRAST CT CERVICAL SPINE WITHOUT CONTRAST TECHNIQUE: Multidetector CT imaging of the head and cervical spine was performed following the standard protocol without intravenous contrast. Multiplanar CT image reconstructions of the cervical spine were also generated. RADIATION DOSE REDUCTION: This exam was performed according to the departmental dose-optimization program which includes automated exposure control, adjustment of the mA and/or kV according to patient size and/or use of iterative reconstruction technique. COMPARISON:  06/20/2023 FINDINGS: CT HEAD FINDINGS Brain: No evidence of acute infarction, hemorrhage, hydrocephalus, extra-axial collection or mass lesion/mass effect. Mild cerebral volume loss for age. Mineralization in the bilateral deep cerebellum and basal ganglia, likely senile. Vascular: No hyperdense vessel or unexpected calcification. Skull: Posterior scalp laceration on the left. No acute fracture or opaque foreign body. Sinuses/Orbits: No evidence of injury CT CERVICAL SPINE FINDINGS Alignment: No traumatic malalignment Skull base and vertebrae: No acute fracture. No primary bone lesion or focal pathologic process. Hemangioma in the T1 body. Subjective generalized osteopenia. Soft tissues and  spinal canal: No prevertebral fluid or swelling. No visible canal hematoma. Disc levels:  Generalized degenerative endplate and facet spurring. Upper chest: Clear apical lungs. IMPRESSION: No evidence of acute intracranial or cervical spine injury. Electronically Signed   By: Tiburcio Pea M.D.   On: 09/30/2023 10:09   CT Cervical Spine Wo Contrast  Result Date: 09/30/2023 CLINICAL DATA:  Fall getting into tub with posterior head injury. Neck and shoulder pain. EXAM: CT HEAD WITHOUT CONTRAST CT CERVICAL SPINE WITHOUT CONTRAST TECHNIQUE: Multidetector CT imaging of the head and cervical spine was performed following the standard protocol without intravenous contrast. Multiplanar CT image reconstructions of the cervical spine were also generated. RADIATION DOSE REDUCTION: This exam was performed according to the departmental dose-optimization program which includes automated exposure control, adjustment of the mA and/or kV according to patient size and/or use of iterative reconstruction technique. COMPARISON:  06/20/2023 FINDINGS: CT HEAD FINDINGS Brain: No evidence of acute  infarction, hemorrhage, hydrocephalus, extra-axial collection or mass lesion/mass effect. Mild cerebral volume loss for age. Mineralization in the bilateral deep cerebellum and basal ganglia, likely senile. Vascular: No hyperdense vessel or unexpected calcification. Skull: Posterior scalp laceration on the left. No acute fracture or opaque foreign body. Sinuses/Orbits: No evidence of injury CT CERVICAL SPINE FINDINGS Alignment: No traumatic malalignment Skull base and vertebrae: No acute fracture. No primary bone lesion or focal pathologic process. Hemangioma in the T1 body. Subjective generalized osteopenia. Soft tissues and spinal canal: No prevertebral fluid or swelling. No visible canal hematoma. Disc levels:  Generalized degenerative endplate and facet spurring. Upper chest: Clear apical lungs. IMPRESSION: No evidence of acute  intracranial or cervical spine injury. Electronically Signed   By: Tiburcio Pea M.D.   On: 09/30/2023 10:09    Procedures Procedures    Medications Ordered in ED Medications - No data to display  ED Course/ Medical Decision Making/ A&P                                 Medical Decision Making Amount and/or Complexity of Data Reviewed Radiology: ordered.   Patient with fall.  Hit head.  On anticoagulation.  Has hematoma but no frank laceration.  Head CT and cervical spine CT done reassuring.  Also x-ray done.  Has tenderness over left upper shoulder/trapezius but not over actual shoulder.  Good range of motion.  Do not think any shoulder x-ray.  Chest x-ray reassuring.  Discussed with family members will discharge home.        Final Clinical Impression(s) / ED Diagnoses Final diagnoses:  Fall, initial encounter  Scalp hematoma, initial encounter    Rx / DC Orders ED Discharge Orders     None         Benjiman Core, MD 09/30/23 1117

## 2023-09-30 NOTE — ED Triage Notes (Signed)
Pt BIB ems from a fall getting into the bathtub. Pt hit the posterior part of her head. Golf ball size hematoma noted with some blood. Pt does take blood thinner for a-fib. Per ems pt was complaint of neck and left shoulder pain, when assessed during triage pt denies pain.

## 2023-10-04 ENCOUNTER — Ambulatory Visit: Payer: Medicare PPO | Attending: Cardiovascular Disease | Admitting: Cardiovascular Disease

## 2023-10-04 ENCOUNTER — Encounter: Payer: Self-pay | Admitting: Cardiovascular Disease

## 2023-10-04 VITALS — BP 94/50 | HR 72 | Ht 64.0 in | Wt 101.8 lb

## 2023-10-04 DIAGNOSIS — I10 Essential (primary) hypertension: Secondary | ICD-10-CM

## 2023-10-04 DIAGNOSIS — Z5181 Encounter for therapeutic drug level monitoring: Secondary | ICD-10-CM | POA: Diagnosis not present

## 2023-10-04 DIAGNOSIS — E7800 Pure hypercholesterolemia, unspecified: Secondary | ICD-10-CM

## 2023-10-04 DIAGNOSIS — I4819 Other persistent atrial fibrillation: Secondary | ICD-10-CM

## 2023-10-04 DIAGNOSIS — Z79899 Other long term (current) drug therapy: Secondary | ICD-10-CM | POA: Diagnosis not present

## 2023-10-04 DIAGNOSIS — E43 Unspecified severe protein-calorie malnutrition: Secondary | ICD-10-CM

## 2023-10-04 DIAGNOSIS — I495 Sick sinus syndrome: Secondary | ICD-10-CM

## 2023-10-04 DIAGNOSIS — R942 Abnormal results of pulmonary function studies: Secondary | ICD-10-CM | POA: Diagnosis not present

## 2023-10-04 DIAGNOSIS — I5032 Chronic diastolic (congestive) heart failure: Secondary | ICD-10-CM | POA: Diagnosis not present

## 2023-10-04 DIAGNOSIS — Z950 Presence of cardiac pacemaker: Secondary | ICD-10-CM | POA: Diagnosis not present

## 2023-10-04 DIAGNOSIS — I480 Paroxysmal atrial fibrillation: Secondary | ICD-10-CM | POA: Diagnosis not present

## 2023-10-04 DIAGNOSIS — R296 Repeated falls: Secondary | ICD-10-CM

## 2023-10-04 MED ORDER — METOPROLOL SUCCINATE ER 50 MG PO TB24: 50.0000 mg | ORAL_TABLET | Freq: Every day | ORAL | 3 refills | Status: DC

## 2023-10-04 MED ORDER — POTASSIUM CHLORIDE CRYS ER 20 MEQ PO TBCR: EXTENDED_RELEASE_TABLET | ORAL | 3 refills | Status: DC

## 2023-10-04 MED ORDER — PRAVASTATIN SODIUM 40 MG PO TABS: 40.0000 mg | ORAL_TABLET | Freq: Every evening | ORAL | 3 refills | Status: DC

## 2023-10-04 MED ORDER — FUROSEMIDE 40 MG PO TABS: ORAL_TABLET | ORAL | 3 refills | Status: DC

## 2023-10-04 NOTE — Progress Notes (Signed)
Patient ID: Anne Anthony, female   DOB: Jan 09, 1936, 87 y.o.   MRN: [id number]    Visit [facility] cardiology Office Note    Date:  10/06/2023   ID:  Anne Anthony, DOB [birth year], MRN [id number]  PCP:  [name], MD  Cardiologist:   Thurmon Fair, MD   Chief Complaint  Patient presents with   Pacemaker Check     History of Present Illness:  Anne Anthony is a 87 y.o. female with long-standing paroxysmal atrial fibrillation and symptomatic sinus bradycardia, dual chamber Medtronic pacemaker implanted in 2012, hypertension and hyperlipidemia returns in follow-up.  Generally doing well although she has lost some more weight and her BMI is down to 17.47.  She denies any problems with palpitations, dizziness or syncope, but her blood pressure is low at 94/50.  She denies orthopnea, PND, lower extremity edema.  She has not had falls or bleeding problems.  She was hospitalized with heart failure decompensation due to atrial fibrillation with RVR in June 2024 and was difficult to rate control.  She has not been treated with chronic anticoagulation due to her frequent falls.  Eliquis 2.5 mg twice daily was started and use for 3 weeks after which she underwent electrical cardioversion for atrial fibrillation on 05/29/2023.  She returned to the A-fib clinic on 06/12/2023 and was in rate controlled atrial flutter, so she was started on amiodarone.  At her follow-up visit on 07/03/2023 she was in atrial paced, ventricular sensed rhythm.  We subsequently stopped her anticoagulant due to her high risk of falls.  Sure enough, she had emergency room visits for falls on 06/20/2023 and 09/30/2023.  She had significant head impact on the fall a week ago, thankfully there was no evidence of intracranial bleeding and she was no longer on anticoagulation.  She has not had any recurrence of heart failure or any symptomatic atrial fibrillation since then.  There has been no worsening of her shortness of breath 1  year treatment with AVR:.  Pacemaker interrogation performed by me in the clinic today shows normal device function.  She has an estimated 2 years left on the current generator and lead parameters including pacing thresholds are all stable and in good range.  She has 99.6% atrial pacing and only 0.4% ventricular pacing.  She has had a very low prevalence of atrial fibrillation (essentially a single 12.5-hour episode that occurred 06/12/2023 for an overall burden of 0.4%).  Ventricular rate control was adequate.    Amiodarone was discontinued several years ago due to worries about restrictive changes on pulmonary function tests.  She has a history of remote successful ablation for atrial flutter, but then in 2015 had atrial fibrillation with rapid ventricular response when she underwent surgical repair of a paraesophageal hernia.  Her pacemaker did not show any subsequent episodes of atrial fibrillation until May 22, 2022 when she had an 81-hour long episode.  She has little in the way of structural heart disease to predispose for atrial fibrillation recurrence.  Chest CT performed  July 2017 shows a large hiatal hernia with herniation of the splenic flexure of the colon and several small bowel loops into the chest. She does have bilateral compressive atelectasis in both lung bases. Pulmonary function tests 2017 show significant restrictive ventilatory defect with FVC that is 50% of predicted and a commensurate reduction in FEV1 and diffusion capacity . Her chest x-ray last performed in December did not show focal infiltrates but did confirm the presence of a  large hiatal hernia with intrathoracic stomach gas.   Past Medical History:  Diagnosis Date   Atrial flutter (HCC)    successful radiofrequency ablation 2007   Diverticula of colon    Family history of colon cancer    brother   Hiatal hernia    Hyperlipidemia    Hypertension    Pacemaker    Paroxysmal atrial fibrillation (HCC)    Severe  protein-calorie malnutrition (HCC) 11/17/2014   Tachycardia-bradycardia syndrome (HCC)    permament pacermaker, dual chamber Medtronic on 06/2011   Tubular adenoma     Past Surgical History:  Procedure Laterality Date   ABDOMINAL SURGERY     CARDIOVERSION [name] 05/29/2023   Procedure: CARDIOVERSION;  Surgeon: Thurmon Fair, MD;  Location: [facility];  Service: Cardiovascular;  Laterality: [name];   COLONOSCOPY  08/17/2010   Dr. Jena Gauss- internal hemorrhoids, o/w normal rectum, L sided diverticula, tubular adenoma   ESOPHAGOGASTRODUODENOSCOPY  10/502011   Dr. Jena Gauss- huge diaphragmatic/hiatial hernia, fundal gland polyps not manipulated o/w noral appearing gastric mucosa, patent pylorus, normal D1 & D2   HIATAL HERNIA REPAIR  2005   HIP ARTHROPLASTY Left 11/16/2014   Procedure: ARTHROPLASTY BIPOLAR HIP;  Surgeon: Shelda Pal, MD;  Location: [facility];  Service: Orthopedics;  Laterality: Left;   PARTIAL HYSTERECTOMY     permament pacemaker  06/2011   dual chamber , Medtronic Adapta serial # [id number]  last checked 02/05/2013   RADIOFREQUENCY ABLATION  06/28/2006    Outpatient Medications Prior to Visit  Medication Sig Dispense Refill   acetaminophen (TYLENOL) 325 MG tablet Take 1-2 tablets (325-650 mg total) by mouth every 4 (four) hours as needed for fever, headache or mild pain.     amiodarone (PACERONE) 200 MG tablet Take 1 tablet (200 mg total) by mouth daily. 90 tablet 1   dexlansoprazole (DEXILANT) 60 MG capsule Take 1 capsule (60 mg total) by mouth daily. 20 capsule 0   escitalopram (LEXAPRO) 10 MG tablet Take 5 mg by mouth daily.  3   gabapentin (NEURONTIN) 600 MG tablet Take 0.5 tablets (300 mg total) by mouth 2 (two) times daily.     LUMIGAN 0.01 % SOLN Place 1 drop into both eyes at bedtime.     promethazine (PHENERGAN) 25 MG tablet Take 25 mg by mouth 2 (two) times daily as needed.     trimethoprim (TRIMPEX) 100 MG tablet TAKE 1 TABLET BY MOUTH EVERYDAY AT BEDTIME (Patient  taking differently: Take 100 mg by mouth at bedtime.) 90 tablet 3   furosemide (LASIX) 40 MG tablet Take 1 tablet (40 mg total) by mouth daily. 90 tablet 3   metoprolol tartrate (LOPRESSOR) 50 MG tablet Take 1 tablet (50 mg total) by mouth 2 (two) times daily. 180 tablet 1   potassium chloride SA (KLOR-CON M) 20 MEQ tablet Take 1 tablet (20 mEq total) by mouth daily. 90 tablet 3   simvastatin (ZOCOR) 20 MG tablet Take 20 mg by mouth in the morning.     meclizine (ANTIVERT) 25 MG tablet Take 25 mg by mouth every 6 (six) hours as needed for dizziness. (Patient not taking: Reported on 10/04/2023)     apixaban (ELIQUIS) 2.5 MG TABS tablet Take 1 tablet (2.5 mg total) by mouth 2 (two) times daily. (Patient not taking: Reported on 10/04/2023) 60 tablet 6   No facility-administered medications prior to visit.     Allergies:   Morphine and codeine   Social History   Socioeconomic History   Marital  status: Widowed    Spouse name: Not on file   Number of children: 4   Years of education: Not on file   Highest education level: Not on file  Occupational History   Not on file  Tobacco Use   Smoking status: Never   Smokeless tobacco: Never  Vaping Use   Vaping status: Never Used  Substance and Sexual Activity   Alcohol use: No   Drug use: No   Sexual activity: Not on file  Other Topics Concern   Not on file  Social History Narrative   Not on file   Social Determinants of Health   Financial Resource Strain: Not on file  Food Insecurity: No Food Insecurity (05/05/2023)   Hunger Vital Sign    Worried About Running Out of Food in the Last Year: Never true    Ran Out of Food in the Last Year: Never true  Transportation Needs: No Transportation Needs (05/05/2023)   PRAPARE - Administrator, Civil Service (Medical): No    Lack of Transportation (Non-Medical): No  Physical Activity: Not on file  Stress: Not on file  Social Connections: Unknown (03/27/2022)   [facility]   Social Network    Social Network: Not on file     Family History:  The patient's family history includes Diabetes in her mother; Heart disease in her mother.   ROS:   Please see the history of present illness.    ROS All other systems are reviewed and are negative   PHYSICAL EXAM:   VS:  BP (!) 94/50 (BP Location: Left Arm, Patient Position: Sitting, Cuff Size: Normal)   Pulse 72   Ht 5\' 4"  (1.626 m)   Wt 101 lb 12.8 oz (46.2 kg)   SpO2 93%   BMI 17.47 kg/m      General: Alert, oriented x3, no distress, very thin, appears elderly and frail.  Healthy left subclavian pacemaker site. Head: no evidence of trauma, PERRL, EOMI, no exophtalmos or lid lag, no myxedema, no xanthelasma; normal ears, nose and oropharynx Neck: normal jugular venous pulsations and no hepatojugular reflux; brisk carotid pulses without delay and no carotid bruits Chest: clear to auscultation, no signs of consolidation by percussion or palpation, normal fremitus, symmetrical and full respiratory excursions Cardiovascular: normal position and quality of the apical impulse, regular rhythm, normal first and second heart sounds, no murmurs, rubs or gallops Abdomen: no tenderness or distention, no masses by palpation, no abnormal pulsatility or arterial bruits, normal bowel sounds, no hepatosplenomegaly Extremities: no clubbing, cyanosis or edema; 2+ radial, ulnar and brachial pulses bilaterally; 2+ right femoral, posterior tibial and dorsalis pedis pulses; 2+ left femoral, posterior tibial and dorsalis pedis pulses; no subclavian or femoral bruits Neurological: grossly nonfocal Psych: Normal mood and affect'   Wt Readings from Last 3 Encounters:  10/04/23 101 lb 12.8 oz (46.2 kg)  09/30/23 109 lb 8 oz (49.7 kg)  06/20/23 105 lb 6.1 oz (47.8 kg)      Studies/Labs Reviewed:   Comprehensive pacemaker check performed by me in the office today including testing of atrial and ventricular lead  sensing/impedance/pacing thresholds  EKG:    EKG Interpretation Date/Time:  Thursday October 04 2023 11:16:32 EST Ventricular Rate:  72 PR Interval:  238 QRS Duration:  140 QT Interval:  438 QTC Calculation: 479 R Axis:   -43  Text Interpretation: Atrial-paced rhythm with prolonged AV conduction Left axis deviation Left bundle branch block When  compared with ECG of 03-Jul-2023 14:18, No significant change since last tracing Confirmed by Lollie Gunner [phone]) on 10/04/2023 11:18:05 AM          BMET    Component Value Date/Time   NA 140 05/21/2023 1417   K 4.7 05/21/2023 1417   CL 105 05/21/2023 1417   CO2 24 05/21/2023 1417   GLUCOSE 115 (H) 05/21/2023 1417   BUN 34 (H) 05/21/2023 1417   CREATININE 2.01 (H) 05/21/2023 1417   CALCIUM 8.8 (L) 05/21/2023 1417   GFRNONAA 24 (L) 05/21/2023 1417   GFRAA >60 01/31/2018 0359   Lipid Panel  No results found for: "CHOL", "TRIG", "HDL", "CHOLHDL", "VLDL", "LDLCALC", "LDLDIRECT", "LABVLDL"   12/21/2021 Cholesterol 142, HDL 56, LDL 68, triglycerides 95 Hemoglobin 12.9, creatinine 1.18, potassium 4.7, ALT of 9.0, TSH 1.28  05/01/2022 Hemoglobin A1c 6.3%  12/06/2022 Cholesterol 149, HDL 59, LDL 69, triglycerides 117 Hemoglobin A1c 6.7% Creatinine 1.27, potassium 4.3, ALT 10, TSH 1.63  05/04/2023 Cholesterol 100, HDL 50, LDL 32, triglycerides 91 hemoglobin A1c 6.8%, ALT 11, TSH 0.691, hemoglobin 12.6  06/08/2023 Creatinine 1.52, potassium 4.9  ASSESSMENT:    1. Paroxysmal atrial fibrillation with RVR (HCC)   2. Encounter for monitoring amiodarone therapy   3. Chronic diastolic heart failure (HCC)   4. Essential hypertension   5. Tachycardia-bradycardia syndrome (HCC)   6. Pacemaker   7. Hypercholesterolemia   8. Restrictive ventilatory defect   9. Protein-calorie malnutrition, severe (HCC)   10. Falls frequently       PLAN:  In order of problems listed above:  AFib: Continues to have infrequent, but  somewhat lengthy episodes of paroxysmal atrial fibrillation or an overall very low prevalence of arrhythmia.  She has not had any atrial fibrillation since July, almost 4 months.  Very high risk of bleeding with history of subarachnoid hemorrhage and frequent falls.  She has never experienced embolic TIA or stroke.  Continue to believe that the risk of bleeding with anticoagulation in this elderly, frail malnourished patient exceeds the potential benefit.  Amiodarone was suspected of causing lung abnormalities in the past, but that may have actually been artifactual due to her large diaphragmatic hernia.  A Watchman procedure is not particularly appealing due to her age and the presence of a large hiatal hernia which would impede TEE monitoring during the implantation procedure.  CHADSVasc 4-5  (age 41, gender, HTN, +/- preDM). Amiodarone: No evidence of worsening lung function since starting this medication.  Note drug interaction with simvastatin.  Note normal TSH and liver function tests in June 2024. CHF: Has really only had heart failure problems when she was in persistent atrial arrhythmia with RVR, such as occurred in June 2024. HTN: Excessively well-controlled.  Also notes increased creatinine levels.  Will reduce her metoprolol dose (changed to metoprolol succinate 50 mg once daily).  Will also reduce her furosemide and potassium supplement to just 3 days a week. SSS: No symptoms of bradycardia or chronotropic incompetence.  She is not pacemaker dependent, virtually 100% atrial paced, ventricular sensed. PM: Does not perform remote downloads.  Bring her back to the office in 6 months. HLP: All lipid parameters in desirable range, in fact a very low LDL cholesterol level.  Will switch to pravastatin due to lower risk of drug interactions. Restrictive ventilatory defect: Since she is very sedentary she has no complaints of dyspnea.  She has a very large diaphragmatic hernia but probably explains this  (amiodarone was stopped based on abnormal PFTs,  but was probably not the culprit).  I would not entirely exclude the use of amiodarone in the future, but currently this is unnecessary. Malnutrition: BMI is now barely over 17. Frequent falls: Seems to be due to general weakness, not arrhythmia or clear neurological abnormality.  No recent falls   Medication Adjustments/Labs and Tests Ordered: Current medicines are reviewed at length with the patient today.  Concerns regarding medicines are outlined above.  Medication changes, Labs and Tests ordered today are listed in the Patient Instructions below. Patient Instructions  Medication Instructions:  STOP METOPROLOL TARTRATE STOP SIMVASTATIN  START METOPROLOL SUCCINATE (TOPROL XL) 50 MG DAILY START PRAVASTATIN 40 MG EVERY NIGHT  TAKE FUROSEMIDE 40 MG AND POTASSIUM 20 MEQ EVERY MONDAY, WEDNESDAY, AND FRIDAY *If you need a refill on your cardiac medications before your next appointment, please call your pharmacy*  Follow-Up: At [facility], you and your health needs are our priority.  As part of our continuing mission to provide you with exceptional heart care, we have created designated Provider Care Teams.  These Care Teams include your primary Cardiologist (physician) and Advanced Practice Providers (APPs -  Physician Assistants and Nurse Practitioners) who all work together to provide you with the care you need, when you need it.  We recommend signing up for the patient portal called "MyChart".  Sign up information is provided on this After Visit Summary.  MyChart is used to connect with patients for Virtual Visits (Telemedicine).  Patients are able to view lab/test results, encounter notes, upcoming appointments, etc.  Non-urgent messages can be sent to your provider as well.   To learn more about what you can do with MyChart, go to [email or web address].    Your next appointment:   1 year(s)  Provider:   Thurmon Fair, MD          Signed, Thurmon Fair, MD  10/06/2023 5:19 PM    [facility] [address] [place], [place], [place]  [address] Phone: [phone]; Fax: [phone]

## 2023-10-04 NOTE — Patient Instructions (Signed)
Medication Instructions:  STOP METOPROLOL TARTRATE STOP SIMVASTATIN  START METOPROLOL SUCCINATE (TOPROL XL) 50 MG DAILY START PRAVASTATIN 40 MG EVERY NIGHT  TAKE FUROSEMIDE 40 MG AND POTASSIUM 20 MEQ EVERY MONDAY, WEDNESDAY, AND FRIDAY *If you need a refill on your cardiac medications before your next appointment, please call your pharmacy*  Follow-Up: At [facility], you and your health needs are our priority.  As part of our continuing mission to provide you with exceptional heart care, we have created designated Provider Care Teams.  These Care Teams include your primary Cardiologist (physician) and Advanced Practice Providers (APPs -  Physician Assistants and Nurse Practitioners) who all work together to provide you with the care you need, when you need it.  We recommend signing up for the patient portal called "MyChart".  Sign up information is provided on this After Visit Summary.  MyChart is used to connect with patients for Virtual Visits (Telemedicine).  Patients are able to view lab/test results, encounter notes, upcoming appointments, etc.  Non-urgent messages can be sent to your provider as well.   To learn more about what you can do with MyChart, go to [email or web address].    Your next appointment:   1 year(s)  Provider:   Thurmon Fair, MD

## 2023-10-06 ENCOUNTER — Encounter: Payer: Self-pay | Admitting: Cardiovascular Disease

## 2023-11-13 DIAGNOSIS — Z681 Body mass index (BMI) 19 or less, adult: Secondary | ICD-10-CM | POA: Diagnosis not present

## 2023-11-13 DIAGNOSIS — E1140 Type 2 diabetes mellitus with diabetic neuropathy, unspecified: Secondary | ICD-10-CM | POA: Diagnosis not present

## 2023-11-13 DIAGNOSIS — N183 Chronic kidney disease, stage 3 unspecified: Secondary | ICD-10-CM | POA: Diagnosis not present

## 2023-11-27 DIAGNOSIS — D485 Neoplasm of uncertain behavior of skin: Secondary | ICD-10-CM | POA: Diagnosis not present

## 2023-11-27 DIAGNOSIS — L570 Actinic keratosis: Secondary | ICD-10-CM | POA: Diagnosis not present

## 2023-11-27 DIAGNOSIS — D0471 Carcinoma in situ of skin of right lower limb, including hip: Secondary | ICD-10-CM | POA: Diagnosis not present

## 2023-12-04 DIAGNOSIS — H401132 Primary open-angle glaucoma, bilateral, moderate stage: Secondary | ICD-10-CM | POA: Diagnosis not present

## 2023-12-04 DIAGNOSIS — Z961 Presence of intraocular lens: Secondary | ICD-10-CM | POA: Diagnosis not present

## 2023-12-13 DIAGNOSIS — C44722 Squamous cell carcinoma of skin of right lower limb, including hip: Secondary | ICD-10-CM | POA: Diagnosis not present

## 2024-01-03 ENCOUNTER — Ambulatory Visit (HOSPITAL_COMMUNITY): Payer: Medicare PPO | Admitting: Internal Medicine

## 2024-01-14 ENCOUNTER — Ambulatory Visit (HOSPITAL_COMMUNITY)
Admission: RE | Admit: 2024-01-14 | Discharge: 2024-01-14 | Disposition: A | Discharge status: Home / Self Care | Payer: Medicare PPO | Source: Ambulatory Visit | Attending: Internal Medicine | Admitting: Internal Medicine

## 2024-01-14 VITALS — BP 110/54 | HR 72 | Ht 64.0 in | Wt 103.0 lb

## 2024-01-14 DIAGNOSIS — Z5181 Encounter for therapeutic drug level monitoring: Secondary | ICD-10-CM | POA: Diagnosis not present

## 2024-01-14 DIAGNOSIS — I4819 Other persistent atrial fibrillation: Secondary | ICD-10-CM | POA: Insufficient documentation

## 2024-01-14 DIAGNOSIS — I4892 Unspecified atrial flutter: Secondary | ICD-10-CM | POA: Insufficient documentation

## 2024-01-14 DIAGNOSIS — Z7901 Long term (current) use of anticoagulants: Secondary | ICD-10-CM | POA: Insufficient documentation

## 2024-01-14 DIAGNOSIS — D6869 Other thrombophilia: Secondary | ICD-10-CM | POA: Insufficient documentation

## 2024-01-14 DIAGNOSIS — Z79899 Other long term (current) drug therapy: Secondary | ICD-10-CM | POA: Insufficient documentation

## 2024-01-14 DIAGNOSIS — I447 Left bundle-branch block, unspecified: Secondary | ICD-10-CM | POA: Insufficient documentation

## 2024-01-14 LAB — COMPREHENSIVE METABOLIC PANEL
ALT: 20 U/L (ref 0–44)
AST: 33 U/L (ref 15–41)
Albumin: 3.5 g/dL (ref 3.5–5.0)
Alkaline Phosphatase: 71 U/L (ref 38–126)
Anion gap: 10 (ref 5–15)
BUN: 17 mg/dL (ref 8–23)
CO2: 29 mmol/L (ref 22–32)
Calcium: 8.7 mg/dL — ABNORMAL LOW (ref 8.9–10.3)
Chloride: 101 mmol/L (ref 98–111)
Creatinine, Ser: 1.78 mg/dL — ABNORMAL HIGH (ref 0.44–1.00)
GFR, Estimated: 28 mL/min — ABNORMAL LOW (ref >60)
Glucose, Bld: 126 mg/dL — ABNORMAL HIGH (ref 70–99)
Potassium: 4.7 mmol/L (ref 3.5–5.1)
Sodium: 140 mmol/L (ref 135–145)
Total Bilirubin: 0.7 mg/dL (ref 0.0–1.2)
Total Protein: 6.3 g/dL — ABNORMAL LOW (ref 6.5–8.1)

## 2024-01-14 LAB — TSH: TSH: 0.755 u[IU]/mL (ref 0.350–4.500)

## 2024-01-14 NOTE — Progress Notes (Signed)
 Primary Care Physician: [name], MD Primary Cardiologist: Thurmon Fair, MD Electrophysiologist: None     Referring Physician: Dr. Roswell Miners is a 88 y.o. female with a history of chronic diastolic heart failure, reduced systolic LV function, diaphragmatic hernia, GERD, HTN, CKD stage IIIa, dyslipidemia, tachybradycardia syndrome s/p pacemaker implantation, and persistent atrial fibrillation who presents for consultation in the [facility]. Recently admitted 6/2-23 for atrial flutter with RVR and tentatively scheduled for DCCV on 05/29/23. Review of notes show it is felt that patient is not a good long-term candidate for anticoagulation due to history of frequent falls. Could consider amiodarone if necessary per [name]. Also not a candidate for Watchman device either. Patient is on Eliquis 2.5 mg BID for a CHADS2VASC score of 5.  On evaluation today, she is currently in atrial flutter. She feels weak and tired when out of rhythm. No missed doses of Eliquis.   On follow up 06/12/23, she is currently in rate controlled atrial flutter. She is s/p successful DCCV on 05/29/23. She has not missed any doses of Eliquis.  On follow up 01/14/24, she is here for amiodarone surveillance. She is currently in AV paced rhythm. She takes amiodarone 200 mg daily. She has not had any episodes of heart fluttering or racing. She is not on OAC due to high risk of falls.   Today, she denies symptoms of chest pain, shortness of breath, orthopnea, PND, lower extremity edema, dizziness, presyncope, syncope, snoring, daytime somnolence, bleeding, or neurologic sequela. The patient is tolerating medications without difficulties and is otherwise without complaint today.    she has a BMI of Body mass index is 17.68 kg/m.Marland Kitchen Filed Weights   01/14/24 1407  Weight: 46.7 kg      Current Outpatient Medications  Medication Sig Dispense Refill   acetaminophen  (TYLENOL) 325 MG tablet Take 1-2 tablets (325-650 mg total) by mouth every 4 (four) hours as needed for fever, headache or mild pain. (Patient taking differently: Take 650 mg by mouth as needed for fever, headache or mild pain (pain score 1-3).)     amiodarone (PACERONE) 200 MG tablet Take 1 tablet (200 mg total) by mouth daily. 90 tablet 1   dexlansoprazole (DEXILANT) 60 MG capsule Take 1 capsule (60 mg total) by mouth daily. 20 capsule 0   furosemide (LASIX) 40 MG tablet Take 40 mg every Monday, Wednesday, and Friday 45 tablet 3   gabapentin (NEURONTIN) 600 MG tablet Take 0.5 tablets (300 mg total) by mouth 2 (two) times daily.     LUMIGAN 0.01 % SOLN Place 1 drop into both eyes at bedtime.     meclizine (ANTIVERT) 25 MG tablet Take 25 mg by mouth every 6 (six) hours as needed for dizziness.     metoprolol succinate (TOPROL XL) 50 MG 24 hr tablet Take 1 tablet (50 mg total) by mouth daily. 90 tablet 3   potassium chloride SA (KLOR-CON M) 20 MEQ tablet Take 20 mg every Monday, Wednesday, and Friday along with your Furosemide 45 tablet 3   pravastatin (PRAVACHOL) 40 MG tablet Take 1 tablet (40 mg total) by mouth at bedtime. 90 tablet 3   promethazine (PHENERGAN) 25 MG tablet Take 25 mg by mouth 2 (two) times daily as needed.     RABEprazole (ACIPHEX) 20 MG tablet Take 20 mg by mouth daily.     trimethoprim (TRIMPEX) 100 MG tablet TAKE 1 TABLET BY MOUTH EVERYDAY AT BEDTIME (Patient  taking differently: Take 100 mg by mouth at bedtime.) 90 tablet 3   No current facility-administered medications for this encounter.    Atrial Fibrillation Management history:  Previous antiarrhythmic drugs: amiodarone Previous cardioversions: 05/29/23 Previous ablations: None Anticoagulation history: Eliquis 2.5 mg BID   ROS- All systems are reviewed and negative except as per the HPI above.  Physical Exam: BP (!) 110/54   Pulse 72   Ht 5\' 4"  (1.626 m)   Wt 46.7 kg   BMI 17.68 kg/m   GEN- The patient is  well appearing, alert and oriented x 3 today.   Neck - no JVD or carotid bruit noted Lungs- Clear to ausculation bilaterally, normal work of breathing Heart- Regular rate and rhythm, no murmurs, rubs or gallops, PMI not laterally displaced Extremities- no clubbing, cyanosis, or edema Skin - no rash or ecchymosis noted   EKG today demonstrates  Vent. rate 72 BPM PR interval 258 ms QRS duration 152 ms QT/QTcB 462/505 ms P-R-T axes * -71 90 Atrial-paced rhythm with prolonged AV conduction Left axis deviation Left bundle branch block Abnormal ECG When compared with ECG of 04-Oct-2023 11:16, PREVIOUS ECG IS PRESENT  Echo 05/04/23 demonstrated   1. Left ventricular ejection fraction, by estimation, is 50%. The left  ventricle has low normal function. The left ventricle has no regional wall  motion abnormalities. Left ventricular diastolic parameters are  indeterminate.   2. Right ventricular systolic function is mildly reduced. The right  ventricular size is moderately enlarged. There is normal pulmonary artery  systolic pressure.   3. Left atrial size was moderately dilated.   4. Right atrial size was moderately dilated.   5. The mitral valve is degenerative. Trivial mitral valve regurgitation.  No evidence of mitral stenosis.   6. The aortic valve is grossly normal. Aortic valve regurgitation is not  visualized. No aortic stenosis is present.   7. The inferior vena cava is normal in size with greater than 50%  respiratory variability, suggesting right atrial pressure of 3 mmHg.   ASSESSMENT & PLAN CHA2DS2-VASc Score = 5  The patient's score is based upon: CHF History: 1 HTN History: 1 Diabetes History: 0 Stroke History: 0 Vascular Disease History: 0 Age Score: 2 Gender Score: 1       ASSESSMENT AND PLAN: Persistent Atrial Fibrillation (ICD10:  I48.19) / atrial flutter  The patient's CHA2DS2-VASc score is 5, indicating a 7.2% annual risk of stroke.   S/p successful  DCCV on 05/29/23.   She is currently in AV paced rhythm.  High risk medication monitoring (ICD10: R7229428) Patient requires ongoing monitoring for anti-arrhythmic medication which has the potential to cause life threatening arrhythmias or AV block. Qtc stable. 471 ms corrected. Continue amiodarone 200 mg daily. Cmet and TSH drawn today.   Secondary Hypercoagulable State (ICD10:  D68.69) The patient is at significant risk for stroke/thromboembolism based upon her CHA2DS2-VASc Score of 5.  Patient's family stopped OAC due to risk of frequent falls.      Follow up 6 months for amiodarone surveillance.   Lake Bells, PA-C  [facility] [address] [place], [place] [address] [phone]

## 2024-01-22 ENCOUNTER — Other Ambulatory Visit (HOSPITAL_COMMUNITY): Payer: Self-pay | Admitting: Internal Medicine

## 2024-01-24 DIAGNOSIS — N289 Disorder of kidney and ureter, unspecified: Secondary | ICD-10-CM | POA: Diagnosis not present

## 2024-01-24 DIAGNOSIS — E7849 Other hyperlipidemia: Secondary | ICD-10-CM | POA: Diagnosis not present

## 2024-01-24 DIAGNOSIS — E1140 Type 2 diabetes mellitus with diabetic neuropathy, unspecified: Secondary | ICD-10-CM | POA: Diagnosis not present

## 2024-01-24 DIAGNOSIS — Z681 Body mass index (BMI) 19 or less, adult: Secondary | ICD-10-CM | POA: Diagnosis not present

## 2024-01-24 DIAGNOSIS — E782 Mixed hyperlipidemia: Secondary | ICD-10-CM | POA: Diagnosis not present

## 2024-01-24 DIAGNOSIS — E538 Deficiency of other specified B group vitamins: Secondary | ICD-10-CM | POA: Diagnosis not present

## 2024-01-24 DIAGNOSIS — I5030 Unspecified diastolic (congestive) heart failure: Secondary | ICD-10-CM | POA: Diagnosis not present

## 2024-01-24 DIAGNOSIS — I495 Sick sinus syndrome: Secondary | ICD-10-CM | POA: Diagnosis not present

## 2024-02-11 ENCOUNTER — Emergency Department (HOSPITAL_BASED_OUTPATIENT_CLINIC_OR_DEPARTMENT_OTHER)

## 2024-02-11 ENCOUNTER — Other Ambulatory Visit: Payer: Self-pay

## 2024-02-11 ENCOUNTER — Emergency Department (HOSPITAL_BASED_OUTPATIENT_CLINIC_OR_DEPARTMENT_OTHER)
Admission: EM | Admit: 2024-02-11 | Discharge: 2024-02-11 | Disposition: A | Discharge status: Home / Self Care | Attending: Emergency Medicine | Admitting: Emergency Medicine

## 2024-02-11 ENCOUNTER — Encounter (HOSPITAL_BASED_OUTPATIENT_CLINIC_OR_DEPARTMENT_OTHER): Payer: Self-pay

## 2024-02-11 DIAGNOSIS — R531 Weakness: Secondary | ICD-10-CM | POA: Insufficient documentation

## 2024-02-11 DIAGNOSIS — S0990XA Unspecified injury of head, initial encounter: Secondary | ICD-10-CM | POA: Diagnosis not present

## 2024-02-11 DIAGNOSIS — I10 Essential (primary) hypertension: Secondary | ICD-10-CM | POA: Diagnosis not present

## 2024-02-11 DIAGNOSIS — Z950 Presence of cardiac pacemaker: Secondary | ICD-10-CM | POA: Insufficient documentation

## 2024-02-11 LAB — URINALYSIS, ROUTINE W REFLEX MICROSCOPIC
Bacteria, UA: NONE SEEN
Bilirubin Urine: NEGATIVE
Glucose, UA: NEGATIVE mg/dL
Hgb urine dipstick: NEGATIVE
Ketones, ur: NEGATIVE mg/dL
Nitrite: NEGATIVE
Protein, ur: NEGATIVE mg/dL
Specific Gravity, Urine: 1.014 (ref 1.005–1.030)
pH: 6.5 (ref 5.0–8.0)

## 2024-02-11 LAB — COMPREHENSIVE METABOLIC PANEL WITH GFR
ALT: 28 U/L (ref 0–44)
AST: 43 U/L — ABNORMAL HIGH (ref 15–41)
Albumin: 4.1 g/dL (ref 3.5–5.0)
Alkaline Phosphatase: 73 U/L (ref 38–126)
Anion gap: 7 (ref 5–15)
BUN: 34 mg/dL — ABNORMAL HIGH (ref 8–23)
CO2: 31 mmol/L (ref 22–32)
Calcium: 8.7 mg/dL — ABNORMAL LOW (ref 8.9–10.3)
Chloride: 103 mmol/L (ref 98–111)
Creatinine, Ser: 1.88 mg/dL — ABNORMAL HIGH (ref 0.44–1.00)
GFR, Estimated: 26 mL/min — ABNORMAL LOW (ref >60)
Glucose, Bld: 131 mg/dL — ABNORMAL HIGH (ref 70–99)
Potassium: 3.9 mmol/L (ref 3.5–5.1)
Sodium: 141 mmol/L (ref 135–145)
Total Bilirubin: 0.6 mg/dL (ref 0.0–1.2)
Total Protein: 6.7 g/dL (ref 6.5–8.1)

## 2024-02-11 LAB — CBC WITH DIFFERENTIAL/PLATELET
Abs Immature Granulocytes: 0.03 10*3/uL (ref 0.00–0.07)
Basophils Absolute: 0.1 10*3/uL (ref 0.0–0.1)
Basophils Relative: 2 %
Eosinophils Absolute: 0.2 10*3/uL (ref 0.0–0.5)
Eosinophils Relative: 4 %
HCT: 38.5 % (ref 36.0–46.0)
Hemoglobin: 12.4 g/dL (ref 12.0–15.0)
Immature Granulocytes: 1 %
Lymphocytes Relative: 25 %
Lymphs Abs: 1.4 10*3/uL (ref 0.7–4.0)
MCH: 35.4 pg — ABNORMAL HIGH (ref 26.0–34.0)
MCHC: 32.2 g/dL (ref 30.0–36.0)
MCV: 110 fL — ABNORMAL HIGH (ref 80.0–100.0)
Monocytes Absolute: 0.5 10*3/uL (ref 0.1–1.0)
Monocytes Relative: 10 %
Neutro Abs: 3.2 10*3/uL (ref 1.7–7.7)
Neutrophils Relative %: 58 %
Platelets: 181 10*3/uL (ref 150–400)
RBC: 3.5 MIL/uL — ABNORMAL LOW (ref 3.87–5.11)
RDW: 13.2 % (ref 11.5–15.5)
WBC: 5.4 10*3/uL (ref 4.0–10.5)
nRBC: 0 % (ref 0.0–0.2)

## 2024-02-11 MED ORDER — SODIUM CHLORIDE 0.9 % IV BOLUS
500.0000 mL | Freq: Once | INTRAVENOUS | Status: AC
  Administered 2024-02-11: 500 mL via INTRAVENOUS

## 2024-02-11 NOTE — ED Triage Notes (Signed)
 Pt w daughter, advises that pt has "started jerking, like her arm would fly out, shaking in her hands, but then she says her whole body is doing it." Reports decreased appetite, energy "for awhile- in the last month."   Appt Friday for neurologist.

## 2024-02-11 NOTE — Discharge Instructions (Signed)
 You were seen in the emerged from today with weakness and mild dehydration.  Please continue to drink plenty of fluids.  Discuss further with your primary care doctor regarding home health and other assist devices at home.  You may also consider taking your family upon their offered to have you live with them if falling becomes a major issue. Please keep your Neurology appointment later this week.

## 2024-02-11 NOTE — ED Provider Notes (Signed)
 Emergency Department Provider Note   I have reviewed the triage vital signs and the nursing notes.   HISTORY  Chief Complaint Fatigue and Tremors   HPI Anne Anthony is a 88 y.o. female with past history reviewed below presents emergency department with weakness, poor appetite, intermittent spells of leg tremors in the upper extremities.  Patient has had several days to a week of fatigue and poor appetite.  Daughter at bedside states she did have a fall at home several weeks ago but did not seek care in the ED.  She has not been vomiting or more confused just seems tired.  She also intermittently will have spasm like contractions of her unilateral upper extremities.  Occasionally will be left but also right.  Family has also noticed a more constant resting tremor in the hands and she has been referred to neurology with an appointment on Friday.  No rhythmic, constant movements.  No obvious seizure activity.   Past Medical History:  Diagnosis Date   Atrial flutter (HCC)    successful radiofrequency ablation 2007   Diverticula of colon    Family history of colon cancer    brother   Hiatal hernia    Hyperlipidemia    Hypertension    Pacemaker    Paroxysmal atrial fibrillation (HCC)    Severe protein-calorie malnutrition (HCC) 11/17/2014   Tachycardia-bradycardia syndrome (HCC)    permament pacermaker, dual chamber Medtronic on 06/2011   Tubular adenoma     Review of Systems  Constitutional: No fever/chills Cardiovascular: Denies chest pain. Respiratory: Denies shortness of breath. Gastrointestinal: No abdominal pain.  No nausea, no vomiting.  Skin: Negative for rash. Neurological: Negative for headaches, focal weakness or numbness. Positive spasms of extremities.   ____________________________________________   PHYSICAL EXAM:  VITAL SIGNS: ED Triage Vitals [02/11/24 1004]  Encounter Vitals Group     BP 108/60     Pulse Rate 70     Resp 16     Temp 98.1 F (36.7  C)     Temp Source Oral     SpO2 98 %   Constitutional: Alert and oriented. Well appearing and in no acute distress. Eyes: Conjunctivae are normal.  Head: Atraumatic. Nose: No congestion/rhinnorhea. Mouth/Throat: Mucous membranes are moist.  Neck: No stridor.   Cardiovascular: Normal rate, regular rhythm. Good peripheral circulation. Grossly normal heart sounds.   Respiratory: Normal respiratory effort.  No retractions. Lungs CTAB. Gastrointestinal: Soft and nontender. No distention.  Musculoskeletal: No lower extremity tenderness nor edema. No gross deformities of extremities. Neurologic:  Normal speech and language. No gross focal neurologic deficits are appreciated. No visible tremor or movements concerning for partial seizure.  Skin:  Skin is warm, dry and intact. No rash noted.   ____________________________________________   LABS (all labs ordered are listed, but only abnormal results are displayed)  Labs Reviewed  COMPREHENSIVE METABOLIC PANEL WITH GFR - Abnormal; Notable for the following components:      Result Value   Glucose, Bld 131 (*)    BUN 34 (*)    Creatinine, Ser 1.88 (*)    Calcium 8.7 (*)    AST 43 (*)    GFR, Estimated 26 (*)    All other components within normal limits  CBC WITH DIFFERENTIAL/PLATELET - Abnormal; Notable for the following components:   RBC 3.50 (*)    MCV 110.0 (*)    MCH 35.4 (*)    All other components within normal limits  URINALYSIS, ROUTINE W REFLEX MICROSCOPIC -  Abnormal; Notable for the following components:   Leukocytes,Ua SMALL (*)    All other components within normal limits      ____________________________________________  RADIOLOGY  CT Head Wo Contrast Result Date: 02/11/2024 CLINICAL DATA:  Minor head trauma. EXAM: CT HEAD WITHOUT CONTRAST TECHNIQUE: Contiguous axial images were obtained from the base of the skull through the vertex without intravenous contrast. RADIATION DOSE REDUCTION: This exam was performed  according to the departmental dose-optimization program which includes automated exposure control, adjustment of the mA and/or kV according to patient size and/or use of iterative reconstruction technique. COMPARISON:  09/30/23 FINDINGS: Brain: No evidence of acute infarction, hemorrhage, hydrocephalus, extra-axial collection or mass lesion/mass effect. Generalized brain atrophy. Symmetric mineralization in the deep cerebellum and deep gray nuclei bilaterally, likely senile. Vascular: No hyperdense vessel or unexpected calcification. Skull: Normal. Negative for fracture or focal lesion. Sinuses/Orbits: No acute finding. IMPRESSION: No acute or reversible finding. Electronically Signed   By: Tiburcio Pea M.D.   On: 02/11/2024 12:02    ____________________________________________   PROCEDURES  Procedure(s) performed:   Procedures  None ____________________________________________   INITIAL IMPRESSION / ASSESSMENT AND PLAN / ED COURSE  Pertinent labs & imaging results that were available during my care of the patient were reviewed by me and considered in my medical decision making (see chart for details).   This patient is Presenting for Evaluation of fatigue, which does require a range of treatment options, and is a complaint that involves a high risk of morbidity and mortality.  The Differential Diagnoses includes but is not exclusive to alcohol, illicit or prescription medications, intracranial pathology such as stroke, intracerebral hemorrhage, fever or infectious causes including sepsis, hypoxemia, uremia, trauma, endocrine related disorders such as diabetes, hypoglycemia, thyroid-related diseases, etc.   Critical Interventions-    Medications  sodium chloride 0.9 % bolus 500 mL (500 mLs Intravenous New Bag/Given 02/11/24 1116)    Reassessment after intervention: symptoms improved.    I did obtain Additional Historical Information from daughter at bedside.   Clinical  Laboratory Tests Ordered, included CBC with fluid EMEA.  Creatinine 1.88 similar to prior values.  Radiologic Tests Ordered, included CT head. I independently interpreted the images and agree with radiology interpretation.   Cardiac Monitor Tracing which shows NSR.    Social Determinants of Health Risk patient lives alone with heavy family intervention.  Medical Decision Making: Summary:  The patient presents to the emergency department with fatigue, poor appetite, recent fall.  Also with some intermittent tremors which seem more like spasm like episodes of the mainly upper extremities.  I do not appreciate any activity on my exam here that makes me think she is having a partial seizure currently.  She has neurology follow-up later this week.  Plan for screening blood work, IV fluids, reassess.  Reevaluation with update and discussion with patient.  She is looking well and neuro intact.    Patient's presentation is most consistent with acute presentation with potential threat to life or bodily function.   Disposition: discharge  ____________________________________________  FINAL CLINICAL IMPRESSION(S) / ED DIAGNOSES  Final diagnoses:  Weakness    Note:  This document was prepared using Dragon voice recognition software and may include unintentional dictation errors.  Alona Bene, MD, [facility] Emergency Medicine    Demarious Kapur, Arlyss Repress, MD 02/11/24 [phone]

## 2024-02-15 DIAGNOSIS — Z1330 Encounter for screening examination for mental health and behavioral disorders, unspecified: Secondary | ICD-10-CM | POA: Diagnosis not present

## 2024-02-15 DIAGNOSIS — R253 Fasciculation: Secondary | ICD-10-CM | POA: Diagnosis not present

## 2024-02-18 ENCOUNTER — Telehealth: Payer: Self-pay

## 2024-02-18 DIAGNOSIS — S0990XA Unspecified injury of head, initial encounter: Secondary | ICD-10-CM | POA: Diagnosis not present

## 2024-02-18 DIAGNOSIS — R059 Cough, unspecified: Secondary | ICD-10-CM | POA: Diagnosis not present

## 2024-02-18 DIAGNOSIS — J189 Pneumonia, unspecified organism: Secondary | ICD-10-CM | POA: Diagnosis not present

## 2024-02-18 DIAGNOSIS — R296 Repeated falls: Secondary | ICD-10-CM | POA: Diagnosis not present

## 2024-02-18 DIAGNOSIS — R443 Hallucinations, unspecified: Secondary | ICD-10-CM | POA: Diagnosis not present

## 2024-02-18 DIAGNOSIS — Z87891 Personal history of nicotine dependence: Secondary | ICD-10-CM | POA: Diagnosis not present

## 2024-02-18 DIAGNOSIS — R627 Adult failure to thrive: Secondary | ICD-10-CM | POA: Diagnosis not present

## 2024-02-18 DIAGNOSIS — G9341 Metabolic encephalopathy: Secondary | ICD-10-CM | POA: Diagnosis not present

## 2024-02-18 DIAGNOSIS — R410 Disorientation, unspecified: Secondary | ICD-10-CM | POA: Diagnosis not present

## 2024-02-18 DIAGNOSIS — I5032 Chronic diastolic (congestive) heart failure: Secondary | ICD-10-CM | POA: Diagnosis not present

## 2024-02-18 DIAGNOSIS — Z515 Encounter for palliative care: Secondary | ICD-10-CM | POA: Diagnosis not present

## 2024-02-18 DIAGNOSIS — R52 Pain, unspecified: Secondary | ICD-10-CM | POA: Diagnosis not present

## 2024-02-18 DIAGNOSIS — Z7401 Bed confinement status: Secondary | ICD-10-CM | POA: Diagnosis not present

## 2024-02-18 DIAGNOSIS — G253 Myoclonus: Secondary | ICD-10-CM | POA: Diagnosis not present

## 2024-02-18 DIAGNOSIS — S2232XD Fracture of one rib, left side, subsequent encounter for fracture with routine healing: Secondary | ICD-10-CM | POA: Diagnosis not present

## 2024-02-18 DIAGNOSIS — Z681 Body mass index (BMI) 19 or less, adult: Secondary | ICD-10-CM | POA: Diagnosis not present

## 2024-02-18 DIAGNOSIS — J9601 Acute respiratory failure with hypoxia: Secondary | ICD-10-CM | POA: Diagnosis not present

## 2024-02-18 DIAGNOSIS — G9340 Encephalopathy, unspecified: Secondary | ICD-10-CM | POA: Diagnosis not present

## 2024-02-18 DIAGNOSIS — L97529 Non-pressure chronic ulcer of other part of left foot with unspecified severity: Secondary | ICD-10-CM | POA: Diagnosis not present

## 2024-02-18 DIAGNOSIS — Z66 Do not resuscitate: Secondary | ICD-10-CM | POA: Diagnosis not present

## 2024-02-18 DIAGNOSIS — I5020 Unspecified systolic (congestive) heart failure: Secondary | ICD-10-CM | POA: Diagnosis not present

## 2024-02-18 DIAGNOSIS — Y95 Nosocomial condition: Secondary | ICD-10-CM | POA: Diagnosis not present

## 2024-02-18 DIAGNOSIS — L97429 Non-pressure chronic ulcer of left heel and midfoot with unspecified severity: Secondary | ICD-10-CM | POA: Diagnosis not present

## 2024-02-18 DIAGNOSIS — Z7189 Other specified counseling: Secondary | ICD-10-CM | POA: Diagnosis not present

## 2024-02-18 DIAGNOSIS — J90 Pleural effusion, not elsewhere classified: Secondary | ICD-10-CM | POA: Diagnosis not present

## 2024-02-18 DIAGNOSIS — K449 Diaphragmatic hernia without obstruction or gangrene: Secondary | ICD-10-CM | POA: Diagnosis not present

## 2024-02-18 DIAGNOSIS — I130 Hypertensive heart and chronic kidney disease with heart failure and stage 1 through stage 4 chronic kidney disease, or unspecified chronic kidney disease: Secondary | ICD-10-CM | POA: Diagnosis not present

## 2024-02-18 DIAGNOSIS — J158 Pneumonia due to other specified bacteria: Secondary | ICD-10-CM | POA: Diagnosis not present

## 2024-02-18 DIAGNOSIS — R531 Weakness: Secondary | ICD-10-CM | POA: Diagnosis not present

## 2024-02-18 DIAGNOSIS — R54 Age-related physical debility: Secondary | ICD-10-CM | POA: Diagnosis not present

## 2024-02-18 DIAGNOSIS — R918 Other nonspecific abnormal finding of lung field: Secondary | ICD-10-CM | POA: Diagnosis not present

## 2024-02-18 DIAGNOSIS — E440 Moderate protein-calorie malnutrition: Secondary | ICD-10-CM | POA: Diagnosis not present

## 2024-02-18 DIAGNOSIS — N1832 Chronic kidney disease, stage 3b: Secondary | ICD-10-CM | POA: Diagnosis not present

## 2024-02-18 DIAGNOSIS — R1310 Dysphagia, unspecified: Secondary | ICD-10-CM | POA: Diagnosis not present

## 2024-02-18 DIAGNOSIS — R0602 Shortness of breath: Secondary | ICD-10-CM | POA: Diagnosis not present

## 2024-02-18 NOTE — Telephone Encounter (Signed)
 Pt daughter wanted [place] to fax a copy of the pt pacemaker and leads information. I told her [facility] can fax [place] a clearance form for her pacemaker information. I also gave her [employer] tech support number to order a new pacemaker id card.

## 2024-02-20 ENCOUNTER — Telehealth: Payer: Self-pay | Admitting: Emergency Medicine

## 2024-02-20 NOTE — Telephone Encounter (Signed)
 Faxed Requested Cardiology Order Form

## 2024-02-22 ENCOUNTER — Telehealth: Payer: Self-pay | Admitting: Cardiovascular Disease

## 2024-02-22 NOTE — Telephone Encounter (Signed)
 Patient identification verified by 2 forms. Marilynn Rail, RN    Called and spoke to patients daughter vickie  Relayed provider message below  Vickie verbalized understanding, no questions at this time

## 2024-02-22 NOTE — Telephone Encounter (Signed)
 Daughter calling to speak with the nurse, patient is in the hospital at [facility]. States that the situation is bad. Please advise

## 2024-02-22 NOTE — Telephone Encounter (Signed)
 Patient identification verified by 2 forms. Marilynn Rail, RN    Called and spoke to patients daughter Ellis Savage states:   -patient is admitted at [facility]   -patient unable to get MRI due to pace maker being old   -patient unable to get CT with contrast due to kidney function   -would like to know if [name] has recommendations on how imaging can be completed   -concerned nothing is being done with patients care and she is declining daily   -will try have patient transferred to [place]  Informed Chip Boer: -while patient admitted care would be coordinated by providers in hospital  -message sent to Dr. Heath Lark verbalized understanding, no questions at this time

## 2024-02-22 NOTE — Telephone Encounter (Signed)
 I read through the notes that are available in care everywhere. I am not sure what part of the body they are trying to image.  Unfortunately she cannot have MRI.  If they are trying to image the brain or the chest, CT without contrast can be performed and is often enough to answer most questions.  I am sorry Anne Anthony is not doing well.  Please give the family my best wishes.

## 2024-03-07 DIAGNOSIS — I5030 Unspecified diastolic (congestive) heart failure: Secondary | ICD-10-CM | POA: Diagnosis not present

## 2024-03-07 DIAGNOSIS — E538 Deficiency of other specified B group vitamins: Secondary | ICD-10-CM | POA: Diagnosis not present

## 2024-03-07 DIAGNOSIS — J189 Pneumonia, unspecified organism: Secondary | ICD-10-CM | POA: Diagnosis not present

## 2024-03-10 DIAGNOSIS — S81811D Laceration without foreign body, right lower leg, subsequent encounter: Secondary | ICD-10-CM | POA: Diagnosis not present

## 2024-03-10 DIAGNOSIS — G9341 Metabolic encephalopathy: Secondary | ICD-10-CM | POA: Diagnosis not present

## 2024-03-10 DIAGNOSIS — L89152 Pressure ulcer of sacral region, stage 2: Secondary | ICD-10-CM | POA: Diagnosis not present

## 2024-03-10 DIAGNOSIS — N183 Chronic kidney disease, stage 3 unspecified: Secondary | ICD-10-CM | POA: Diagnosis not present

## 2024-03-10 DIAGNOSIS — L89622 Pressure ulcer of left heel, stage 2: Secondary | ICD-10-CM | POA: Diagnosis not present

## 2024-03-10 DIAGNOSIS — S81812D Laceration without foreign body, left lower leg, subsequent encounter: Secondary | ICD-10-CM | POA: Diagnosis not present

## 2024-03-10 DIAGNOSIS — J189 Pneumonia, unspecified organism: Secondary | ICD-10-CM | POA: Diagnosis not present

## 2024-03-10 DIAGNOSIS — I509 Heart failure, unspecified: Secondary | ICD-10-CM | POA: Diagnosis not present

## 2024-03-10 DIAGNOSIS — L89892 Pressure ulcer of other site, stage 2: Secondary | ICD-10-CM | POA: Diagnosis not present

## 2024-03-11 DIAGNOSIS — J189 Pneumonia, unspecified organism: Secondary | ICD-10-CM | POA: Diagnosis not present

## 2024-03-11 DIAGNOSIS — G9341 Metabolic encephalopathy: Secondary | ICD-10-CM | POA: Diagnosis not present

## 2024-03-11 DIAGNOSIS — S81811D Laceration without foreign body, right lower leg, subsequent encounter: Secondary | ICD-10-CM | POA: Diagnosis not present

## 2024-03-11 DIAGNOSIS — N183 Chronic kidney disease, stage 3 unspecified: Secondary | ICD-10-CM | POA: Diagnosis not present

## 2024-03-11 DIAGNOSIS — I509 Heart failure, unspecified: Secondary | ICD-10-CM | POA: Diagnosis not present

## 2024-03-11 DIAGNOSIS — L89622 Pressure ulcer of left heel, stage 2: Secondary | ICD-10-CM | POA: Diagnosis not present

## 2024-03-11 DIAGNOSIS — L89892 Pressure ulcer of other site, stage 2: Secondary | ICD-10-CM | POA: Diagnosis not present

## 2024-03-11 DIAGNOSIS — S81812D Laceration without foreign body, left lower leg, subsequent encounter: Secondary | ICD-10-CM | POA: Diagnosis not present

## 2024-03-11 DIAGNOSIS — L89152 Pressure ulcer of sacral region, stage 2: Secondary | ICD-10-CM | POA: Diagnosis not present

## 2024-03-14 DIAGNOSIS — L89892 Pressure ulcer of other site, stage 2: Secondary | ICD-10-CM | POA: Diagnosis not present

## 2024-03-14 DIAGNOSIS — L89152 Pressure ulcer of sacral region, stage 2: Secondary | ICD-10-CM | POA: Diagnosis not present

## 2024-03-14 DIAGNOSIS — L89622 Pressure ulcer of left heel, stage 2: Secondary | ICD-10-CM | POA: Diagnosis not present

## 2024-03-14 DIAGNOSIS — S81812D Laceration without foreign body, left lower leg, subsequent encounter: Secondary | ICD-10-CM | POA: Diagnosis not present

## 2024-03-14 DIAGNOSIS — I509 Heart failure, unspecified: Secondary | ICD-10-CM | POA: Diagnosis not present

## 2024-03-14 DIAGNOSIS — S81811D Laceration without foreign body, right lower leg, subsequent encounter: Secondary | ICD-10-CM | POA: Diagnosis not present

## 2024-03-14 DIAGNOSIS — G9341 Metabolic encephalopathy: Secondary | ICD-10-CM | POA: Diagnosis not present

## 2024-03-14 DIAGNOSIS — N183 Chronic kidney disease, stage 3 unspecified: Secondary | ICD-10-CM | POA: Diagnosis not present

## 2024-03-14 DIAGNOSIS — J189 Pneumonia, unspecified organism: Secondary | ICD-10-CM | POA: Diagnosis not present

## 2024-03-17 DIAGNOSIS — I5032 Chronic diastolic (congestive) heart failure: Secondary | ICD-10-CM | POA: Diagnosis not present

## 2024-03-17 DIAGNOSIS — L89622 Pressure ulcer of left heel, stage 2: Secondary | ICD-10-CM | POA: Diagnosis not present

## 2024-03-17 DIAGNOSIS — S81811D Laceration without foreign body, right lower leg, subsequent encounter: Secondary | ICD-10-CM | POA: Diagnosis not present

## 2024-03-17 DIAGNOSIS — L89152 Pressure ulcer of sacral region, stage 2: Secondary | ICD-10-CM | POA: Diagnosis not present

## 2024-03-17 DIAGNOSIS — Z515 Encounter for palliative care: Secondary | ICD-10-CM | POA: Diagnosis not present

## 2024-03-17 DIAGNOSIS — I10 Essential (primary) hypertension: Secondary | ICD-10-CM | POA: Diagnosis not present

## 2024-03-17 DIAGNOSIS — N183 Chronic kidney disease, stage 3 unspecified: Secondary | ICD-10-CM | POA: Diagnosis not present

## 2024-03-17 DIAGNOSIS — G9341 Metabolic encephalopathy: Secondary | ICD-10-CM | POA: Diagnosis not present

## 2024-03-17 DIAGNOSIS — J189 Pneumonia, unspecified organism: Secondary | ICD-10-CM | POA: Diagnosis not present

## 2024-03-17 DIAGNOSIS — R630 Anorexia: Secondary | ICD-10-CM | POA: Diagnosis not present

## 2024-03-17 DIAGNOSIS — Z7409 Other reduced mobility: Secondary | ICD-10-CM | POA: Diagnosis not present

## 2024-03-17 DIAGNOSIS — I509 Heart failure, unspecified: Secondary | ICD-10-CM | POA: Diagnosis not present

## 2024-03-17 DIAGNOSIS — S81812D Laceration without foreign body, left lower leg, subsequent encounter: Secondary | ICD-10-CM | POA: Diagnosis not present

## 2024-03-17 DIAGNOSIS — R5383 Other fatigue: Secondary | ICD-10-CM | POA: Diagnosis not present

## 2024-03-17 DIAGNOSIS — M6281 Muscle weakness (generalized): Secondary | ICD-10-CM | POA: Diagnosis not present

## 2024-03-17 DIAGNOSIS — R634 Abnormal weight loss: Secondary | ICD-10-CM | POA: Diagnosis not present

## 2024-03-17 DIAGNOSIS — L89892 Pressure ulcer of other site, stage 2: Secondary | ICD-10-CM | POA: Diagnosis not present

## 2024-03-18 ENCOUNTER — Telehealth: Payer: Self-pay | Admitting: Cardiovascular Disease

## 2024-03-18 DIAGNOSIS — L89622 Pressure ulcer of left heel, stage 2: Secondary | ICD-10-CM | POA: Diagnosis not present

## 2024-03-18 DIAGNOSIS — L89152 Pressure ulcer of sacral region, stage 2: Secondary | ICD-10-CM | POA: Diagnosis not present

## 2024-03-18 DIAGNOSIS — S81811D Laceration without foreign body, right lower leg, subsequent encounter: Secondary | ICD-10-CM | POA: Diagnosis not present

## 2024-03-18 DIAGNOSIS — G9341 Metabolic encephalopathy: Secondary | ICD-10-CM | POA: Diagnosis not present

## 2024-03-18 DIAGNOSIS — I509 Heart failure, unspecified: Secondary | ICD-10-CM | POA: Diagnosis not present

## 2024-03-18 DIAGNOSIS — L89892 Pressure ulcer of other site, stage 2: Secondary | ICD-10-CM | POA: Diagnosis not present

## 2024-03-18 DIAGNOSIS — J189 Pneumonia, unspecified organism: Secondary | ICD-10-CM | POA: Diagnosis not present

## 2024-03-18 DIAGNOSIS — N183 Chronic kidney disease, stage 3 unspecified: Secondary | ICD-10-CM | POA: Diagnosis not present

## 2024-03-18 DIAGNOSIS — S81812D Laceration without foreign body, left lower leg, subsequent encounter: Secondary | ICD-10-CM | POA: Diagnosis not present

## 2024-03-18 NOTE — Telephone Encounter (Signed)
 Returned call to [place] who states she has already spoke with Dr Tilman Fonder office (PCP on file) and they have discussed the Amiodarone  and are going to reach out to patient's family and make a decision on continuing or stopping. Anne Anthony states she has no questions at this time and will call us  back if arise.

## 2024-03-18 NOTE — Telephone Encounter (Signed)
 Pt c/o medication issue:  1. Name of Medication:   amiodarone  (PACERONE ) 200 MG tablet    2. How are you currently taking this medication (dosage and times per day)? Discontinued   3. Are you having a reaction (difficulty breathing--STAT)? No   4. What is your medication issue? Claryce Cruel, pt's [facility] nurse, called in stating pt was in the hospital at [facility] in [place] and they discontinued her amiodarone . She asked if pt needs to be on anything else. Please advise.

## 2024-03-19 DIAGNOSIS — I509 Heart failure, unspecified: Secondary | ICD-10-CM | POA: Diagnosis not present

## 2024-03-19 DIAGNOSIS — L89622 Pressure ulcer of left heel, stage 2: Secondary | ICD-10-CM | POA: Diagnosis not present

## 2024-03-19 DIAGNOSIS — N183 Chronic kidney disease, stage 3 unspecified: Secondary | ICD-10-CM | POA: Diagnosis not present

## 2024-03-19 DIAGNOSIS — J189 Pneumonia, unspecified organism: Secondary | ICD-10-CM | POA: Diagnosis not present

## 2024-03-19 DIAGNOSIS — S81811D Laceration without foreign body, right lower leg, subsequent encounter: Secondary | ICD-10-CM | POA: Diagnosis not present

## 2024-03-19 DIAGNOSIS — L89892 Pressure ulcer of other site, stage 2: Secondary | ICD-10-CM | POA: Diagnosis not present

## 2024-03-19 DIAGNOSIS — S81812D Laceration without foreign body, left lower leg, subsequent encounter: Secondary | ICD-10-CM | POA: Diagnosis not present

## 2024-03-19 DIAGNOSIS — G9341 Metabolic encephalopathy: Secondary | ICD-10-CM | POA: Diagnosis not present

## 2024-03-19 DIAGNOSIS — L89152 Pressure ulcer of sacral region, stage 2: Secondary | ICD-10-CM | POA: Diagnosis not present

## 2024-03-21 DIAGNOSIS — I509 Heart failure, unspecified: Secondary | ICD-10-CM | POA: Diagnosis not present

## 2024-03-21 DIAGNOSIS — N183 Chronic kidney disease, stage 3 unspecified: Secondary | ICD-10-CM | POA: Diagnosis not present

## 2024-03-21 DIAGNOSIS — L89892 Pressure ulcer of other site, stage 2: Secondary | ICD-10-CM | POA: Diagnosis not present

## 2024-03-21 DIAGNOSIS — L89622 Pressure ulcer of left heel, stage 2: Secondary | ICD-10-CM | POA: Diagnosis not present

## 2024-03-21 DIAGNOSIS — S81812D Laceration without foreign body, left lower leg, subsequent encounter: Secondary | ICD-10-CM | POA: Diagnosis not present

## 2024-03-21 DIAGNOSIS — S81811D Laceration without foreign body, right lower leg, subsequent encounter: Secondary | ICD-10-CM | POA: Diagnosis not present

## 2024-03-21 DIAGNOSIS — L89152 Pressure ulcer of sacral region, stage 2: Secondary | ICD-10-CM | POA: Diagnosis not present

## 2024-03-21 DIAGNOSIS — G9341 Metabolic encephalopathy: Secondary | ICD-10-CM | POA: Diagnosis not present

## 2024-03-21 DIAGNOSIS — J189 Pneumonia, unspecified organism: Secondary | ICD-10-CM | POA: Diagnosis not present

## 2024-03-24 DIAGNOSIS — I509 Heart failure, unspecified: Secondary | ICD-10-CM | POA: Diagnosis not present

## 2024-03-24 DIAGNOSIS — L89622 Pressure ulcer of left heel, stage 2: Secondary | ICD-10-CM | POA: Diagnosis not present

## 2024-03-24 DIAGNOSIS — S81812D Laceration without foreign body, left lower leg, subsequent encounter: Secondary | ICD-10-CM | POA: Diagnosis not present

## 2024-03-24 DIAGNOSIS — N183 Chronic kidney disease, stage 3 unspecified: Secondary | ICD-10-CM | POA: Diagnosis not present

## 2024-03-24 DIAGNOSIS — G9341 Metabolic encephalopathy: Secondary | ICD-10-CM | POA: Diagnosis not present

## 2024-03-24 DIAGNOSIS — S81811D Laceration without foreign body, right lower leg, subsequent encounter: Secondary | ICD-10-CM | POA: Diagnosis not present

## 2024-03-24 DIAGNOSIS — L89152 Pressure ulcer of sacral region, stage 2: Secondary | ICD-10-CM | POA: Diagnosis not present

## 2024-03-24 DIAGNOSIS — L89892 Pressure ulcer of other site, stage 2: Secondary | ICD-10-CM | POA: Diagnosis not present

## 2024-03-24 DIAGNOSIS — J189 Pneumonia, unspecified organism: Secondary | ICD-10-CM | POA: Diagnosis not present

## 2024-03-25 ENCOUNTER — Telehealth: Payer: Self-pay | Admitting: Cardiovascular Disease

## 2024-03-25 DIAGNOSIS — J189 Pneumonia, unspecified organism: Secondary | ICD-10-CM | POA: Diagnosis not present

## 2024-03-25 DIAGNOSIS — I509 Heart failure, unspecified: Secondary | ICD-10-CM | POA: Diagnosis not present

## 2024-03-25 DIAGNOSIS — L89892 Pressure ulcer of other site, stage 2: Secondary | ICD-10-CM | POA: Diagnosis not present

## 2024-03-25 DIAGNOSIS — L89622 Pressure ulcer of left heel, stage 2: Secondary | ICD-10-CM | POA: Diagnosis not present

## 2024-03-25 DIAGNOSIS — N183 Chronic kidney disease, stage 3 unspecified: Secondary | ICD-10-CM | POA: Diagnosis not present

## 2024-03-25 DIAGNOSIS — S81812D Laceration without foreign body, left lower leg, subsequent encounter: Secondary | ICD-10-CM | POA: Diagnosis not present

## 2024-03-25 DIAGNOSIS — L89152 Pressure ulcer of sacral region, stage 2: Secondary | ICD-10-CM | POA: Diagnosis not present

## 2024-03-25 DIAGNOSIS — S81811D Laceration without foreign body, right lower leg, subsequent encounter: Secondary | ICD-10-CM | POA: Diagnosis not present

## 2024-03-25 DIAGNOSIS — G9341 Metabolic encephalopathy: Secondary | ICD-10-CM | POA: Diagnosis not present

## 2024-03-25 NOTE — Telephone Encounter (Signed)
 I looked through the [facility] records and cannot explain why amiodarone  was stopped. For the time being I would have her stay off amiodarone . Is she able to travel for an office visit? She is due a pacemaker check and this has been done in the office every 6 months, does not do downloads.

## 2024-03-25 NOTE — Telephone Encounter (Signed)
 Pt c/o medication issue:  1. Name of Medication:   amiodarone  (PACERONE ) 200 MG tablet    2. How are you currently taking this medication (dosage and times per day)? As written   3. Are you having a reaction (difficulty breathing--STAT)? No   4. What is your medication issue? Pt daughter called in asking if Dr. Alvis Ba wants her to start back on this medication. She states they stopped it when she was in the hospital.

## 2024-03-25 NOTE — Telephone Encounter (Signed)
 Left voicemail to return call to office

## 2024-03-25 NOTE — Telephone Encounter (Signed)
 Pt returning call, requesting cb

## 2024-03-28 DIAGNOSIS — G9341 Metabolic encephalopathy: Secondary | ICD-10-CM | POA: Diagnosis not present

## 2024-03-28 DIAGNOSIS — I509 Heart failure, unspecified: Secondary | ICD-10-CM | POA: Diagnosis not present

## 2024-03-28 DIAGNOSIS — S81811D Laceration without foreign body, right lower leg, subsequent encounter: Secondary | ICD-10-CM | POA: Diagnosis not present

## 2024-03-28 DIAGNOSIS — L89622 Pressure ulcer of left heel, stage 2: Secondary | ICD-10-CM | POA: Diagnosis not present

## 2024-03-28 DIAGNOSIS — L89892 Pressure ulcer of other site, stage 2: Secondary | ICD-10-CM | POA: Diagnosis not present

## 2024-03-28 DIAGNOSIS — S81812D Laceration without foreign body, left lower leg, subsequent encounter: Secondary | ICD-10-CM | POA: Diagnosis not present

## 2024-03-28 DIAGNOSIS — J189 Pneumonia, unspecified organism: Secondary | ICD-10-CM | POA: Diagnosis not present

## 2024-03-28 DIAGNOSIS — L89152 Pressure ulcer of sacral region, stage 2: Secondary | ICD-10-CM | POA: Diagnosis not present

## 2024-03-28 DIAGNOSIS — N183 Chronic kidney disease, stage 3 unspecified: Secondary | ICD-10-CM | POA: Diagnosis not present

## 2024-03-29 DIAGNOSIS — R531 Weakness: Secondary | ICD-10-CM | POA: Diagnosis not present

## 2024-04-01 DIAGNOSIS — L89152 Pressure ulcer of sacral region, stage 2: Secondary | ICD-10-CM | POA: Diagnosis not present

## 2024-04-01 DIAGNOSIS — N183 Chronic kidney disease, stage 3 unspecified: Secondary | ICD-10-CM | POA: Diagnosis not present

## 2024-04-01 DIAGNOSIS — I509 Heart failure, unspecified: Secondary | ICD-10-CM | POA: Diagnosis not present

## 2024-04-01 DIAGNOSIS — S81812D Laceration without foreign body, left lower leg, subsequent encounter: Secondary | ICD-10-CM | POA: Diagnosis not present

## 2024-04-01 DIAGNOSIS — J189 Pneumonia, unspecified organism: Secondary | ICD-10-CM | POA: Diagnosis not present

## 2024-04-01 DIAGNOSIS — G9341 Metabolic encephalopathy: Secondary | ICD-10-CM | POA: Diagnosis not present

## 2024-04-01 DIAGNOSIS — S81811D Laceration without foreign body, right lower leg, subsequent encounter: Secondary | ICD-10-CM | POA: Diagnosis not present

## 2024-04-01 DIAGNOSIS — L89622 Pressure ulcer of left heel, stage 2: Secondary | ICD-10-CM | POA: Diagnosis not present

## 2024-04-01 DIAGNOSIS — L89892 Pressure ulcer of other site, stage 2: Secondary | ICD-10-CM | POA: Diagnosis not present

## 2024-04-02 DIAGNOSIS — S81811D Laceration without foreign body, right lower leg, subsequent encounter: Secondary | ICD-10-CM | POA: Diagnosis not present

## 2024-04-02 DIAGNOSIS — L89892 Pressure ulcer of other site, stage 2: Secondary | ICD-10-CM | POA: Diagnosis not present

## 2024-04-02 DIAGNOSIS — N183 Chronic kidney disease, stage 3 unspecified: Secondary | ICD-10-CM | POA: Diagnosis not present

## 2024-04-02 DIAGNOSIS — J189 Pneumonia, unspecified organism: Secondary | ICD-10-CM | POA: Diagnosis not present

## 2024-04-02 DIAGNOSIS — I509 Heart failure, unspecified: Secondary | ICD-10-CM | POA: Diagnosis not present

## 2024-04-02 DIAGNOSIS — G9341 Metabolic encephalopathy: Secondary | ICD-10-CM | POA: Diagnosis not present

## 2024-04-02 DIAGNOSIS — L89152 Pressure ulcer of sacral region, stage 2: Secondary | ICD-10-CM | POA: Diagnosis not present

## 2024-04-02 DIAGNOSIS — L89622 Pressure ulcer of left heel, stage 2: Secondary | ICD-10-CM | POA: Diagnosis not present

## 2024-04-02 DIAGNOSIS — S81812D Laceration without foreign body, left lower leg, subsequent encounter: Secondary | ICD-10-CM | POA: Diagnosis not present

## 2024-04-03 DIAGNOSIS — G9341 Metabolic encephalopathy: Secondary | ICD-10-CM | POA: Diagnosis not present

## 2024-04-03 DIAGNOSIS — L89152 Pressure ulcer of sacral region, stage 2: Secondary | ICD-10-CM | POA: Diagnosis not present

## 2024-04-03 DIAGNOSIS — L89622 Pressure ulcer of left heel, stage 2: Secondary | ICD-10-CM | POA: Diagnosis not present

## 2024-04-03 DIAGNOSIS — J189 Pneumonia, unspecified organism: Secondary | ICD-10-CM | POA: Diagnosis not present

## 2024-04-03 DIAGNOSIS — S81811D Laceration without foreign body, right lower leg, subsequent encounter: Secondary | ICD-10-CM | POA: Diagnosis not present

## 2024-04-03 DIAGNOSIS — I509 Heart failure, unspecified: Secondary | ICD-10-CM | POA: Diagnosis not present

## 2024-04-03 DIAGNOSIS — L89892 Pressure ulcer of other site, stage 2: Secondary | ICD-10-CM | POA: Diagnosis not present

## 2024-04-03 DIAGNOSIS — N183 Chronic kidney disease, stage 3 unspecified: Secondary | ICD-10-CM | POA: Diagnosis not present

## 2024-04-03 DIAGNOSIS — S81812D Laceration without foreign body, left lower leg, subsequent encounter: Secondary | ICD-10-CM | POA: Diagnosis not present

## 2024-04-03 NOTE — Telephone Encounter (Signed)
 Spoke with pt daughter, aware of dr croitoru's recommendations. Follow up scheduled in July per daughters request.

## 2024-04-08 ENCOUNTER — Other Ambulatory Visit: Payer: Self-pay

## 2024-04-08 MED ORDER — METOPROLOL SUCCINATE ER 50 MG PO TB24: 50.0000 mg | ORAL_TABLET | Freq: Every day | ORAL | 1 refills | Status: DC

## 2024-04-09 ENCOUNTER — Emergency Department (HOSPITAL_COMMUNITY)

## 2024-04-09 ENCOUNTER — Other Ambulatory Visit: Payer: Self-pay

## 2024-04-09 ENCOUNTER — Encounter (HOSPITAL_COMMUNITY): Payer: Self-pay | Admitting: Emergency Medicine

## 2024-04-09 ENCOUNTER — Inpatient Hospital Stay (HOSPITAL_COMMUNITY)

## 2024-04-09 ENCOUNTER — Inpatient Hospital Stay (HOSPITAL_COMMUNITY)
Admission: EM | Admit: 2024-04-09 | Discharge: 2024-04-12 | DRG: 592 | Disposition: A | Discharge status: Hospice | Attending: Internal Medicine | Admitting: Internal Medicine

## 2024-04-09 DIAGNOSIS — Z79899 Other long term (current) drug therapy: Secondary | ICD-10-CM

## 2024-04-09 DIAGNOSIS — I10 Essential (primary) hypertension: Secondary | ICD-10-CM

## 2024-04-09 DIAGNOSIS — Z90711 Acquired absence of uterus with remaining cervical stump: Secondary | ICD-10-CM

## 2024-04-09 DIAGNOSIS — L89512 Pressure ulcer of right ankle, stage 2: Secondary | ICD-10-CM | POA: Diagnosis not present

## 2024-04-09 DIAGNOSIS — L89622 Pressure ulcer of left heel, stage 2: Secondary | ICD-10-CM | POA: Diagnosis not present

## 2024-04-09 DIAGNOSIS — L89893 Pressure ulcer of other site, stage 3: Secondary | ICD-10-CM | POA: Diagnosis not present

## 2024-04-09 DIAGNOSIS — R64 Cachexia: Secondary | ICD-10-CM | POA: Diagnosis not present

## 2024-04-09 DIAGNOSIS — Z751 Person awaiting admission to adequate facility elsewhere: Secondary | ICD-10-CM

## 2024-04-09 DIAGNOSIS — J984 Other disorders of lung: Secondary | ICD-10-CM | POA: Diagnosis present

## 2024-04-09 DIAGNOSIS — M868X8 Other osteomyelitis, other site: Secondary | ICD-10-CM | POA: Diagnosis present

## 2024-04-09 DIAGNOSIS — Z950 Presence of cardiac pacemaker: Secondary | ICD-10-CM | POA: Diagnosis not present

## 2024-04-09 DIAGNOSIS — L89152 Pressure ulcer of sacral region, stage 2: Secondary | ICD-10-CM | POA: Diagnosis not present

## 2024-04-09 DIAGNOSIS — Z681 Body mass index (BMI) 19 or less, adult: Secondary | ICD-10-CM | POA: Diagnosis not present

## 2024-04-09 DIAGNOSIS — I130 Hypertensive heart and chronic kidney disease with heart failure and stage 1 through stage 4 chronic kidney disease, or unspecified chronic kidney disease: Secondary | ICD-10-CM | POA: Diagnosis not present

## 2024-04-09 DIAGNOSIS — Z833 Family history of diabetes mellitus: Secondary | ICD-10-CM

## 2024-04-09 DIAGNOSIS — I700 Atherosclerosis of aorta: Secondary | ICD-10-CM | POA: Diagnosis not present

## 2024-04-09 DIAGNOSIS — Z8249 Family history of ischemic heart disease and other diseases of the circulatory system: Secondary | ICD-10-CM

## 2024-04-09 DIAGNOSIS — J9811 Atelectasis: Secondary | ICD-10-CM | POA: Diagnosis not present

## 2024-04-09 DIAGNOSIS — Z743 Need for continuous supervision: Secondary | ICD-10-CM | POA: Diagnosis not present

## 2024-04-09 DIAGNOSIS — R54 Age-related physical debility: Secondary | ICD-10-CM | POA: Diagnosis present

## 2024-04-09 DIAGNOSIS — R627 Adult failure to thrive: Secondary | ICD-10-CM | POA: Diagnosis present

## 2024-04-09 DIAGNOSIS — I4819 Other persistent atrial fibrillation: Secondary | ICD-10-CM | POA: Diagnosis present

## 2024-04-09 DIAGNOSIS — L89159 Pressure ulcer of sacral region, unspecified stage: Secondary | ICD-10-CM | POA: Diagnosis not present

## 2024-04-09 DIAGNOSIS — S81802A Unspecified open wound, left lower leg, initial encounter: Secondary | ICD-10-CM | POA: Diagnosis not present

## 2024-04-09 DIAGNOSIS — Z800 Family history of malignant neoplasm of digestive organs: Secondary | ICD-10-CM

## 2024-04-09 DIAGNOSIS — L89150 Pressure ulcer of sacral region, unstageable: Secondary | ICD-10-CM

## 2024-04-09 DIAGNOSIS — R531 Weakness: Secondary | ICD-10-CM | POA: Diagnosis not present

## 2024-04-09 DIAGNOSIS — Z7409 Other reduced mobility: Secondary | ICD-10-CM | POA: Diagnosis present

## 2024-04-09 DIAGNOSIS — L89892 Pressure ulcer of other site, stage 2: Secondary | ICD-10-CM | POA: Diagnosis not present

## 2024-04-09 DIAGNOSIS — I509 Heart failure, unspecified: Secondary | ICD-10-CM | POA: Diagnosis not present

## 2024-04-09 DIAGNOSIS — L89154 Pressure ulcer of sacral region, stage 4: Principal | ICD-10-CM | POA: Diagnosis present

## 2024-04-09 DIAGNOSIS — Z96642 Presence of left artificial hip joint: Secondary | ICD-10-CM | POA: Diagnosis present

## 2024-04-09 DIAGNOSIS — I495 Sick sinus syndrome: Secondary | ICD-10-CM | POA: Diagnosis present

## 2024-04-09 DIAGNOSIS — R000 Tachycardia, unspecified: Secondary | ICD-10-CM | POA: Diagnosis not present

## 2024-04-09 DIAGNOSIS — E785 Hyperlipidemia, unspecified: Secondary | ICD-10-CM | POA: Diagnosis present

## 2024-04-09 DIAGNOSIS — R0902 Hypoxemia: Secondary | ICD-10-CM | POA: Diagnosis not present

## 2024-04-09 DIAGNOSIS — S31000A Unspecified open wound of lower back and pelvis without penetration into retroperitoneum, initial encounter: Secondary | ICD-10-CM | POA: Diagnosis present

## 2024-04-09 DIAGNOSIS — I959 Hypotension, unspecified: Secondary | ICD-10-CM | POA: Diagnosis not present

## 2024-04-09 DIAGNOSIS — I5032 Chronic diastolic (congestive) heart failure: Secondary | ICD-10-CM | POA: Diagnosis present

## 2024-04-09 DIAGNOSIS — L89616 Pressure-induced deep tissue damage of right heel: Secondary | ICD-10-CM | POA: Diagnosis present

## 2024-04-09 DIAGNOSIS — N189 Chronic kidney disease, unspecified: Secondary | ICD-10-CM | POA: Diagnosis present

## 2024-04-09 DIAGNOSIS — Z66 Do not resuscitate: Secondary | ICD-10-CM | POA: Diagnosis not present

## 2024-04-09 DIAGNOSIS — I501 Left ventricular failure: Secondary | ICD-10-CM | POA: Diagnosis not present

## 2024-04-09 DIAGNOSIS — F32A Depression, unspecified: Secondary | ICD-10-CM | POA: Diagnosis present

## 2024-04-09 DIAGNOSIS — S81812D Laceration without foreign body, left lower leg, subsequent encounter: Secondary | ICD-10-CM | POA: Diagnosis not present

## 2024-04-09 DIAGNOSIS — N183 Chronic kidney disease, stage 3 unspecified: Secondary | ICD-10-CM | POA: Diagnosis not present

## 2024-04-09 DIAGNOSIS — J189 Pneumonia, unspecified organism: Secondary | ICD-10-CM | POA: Diagnosis not present

## 2024-04-09 DIAGNOSIS — U071 COVID-19: Secondary | ICD-10-CM

## 2024-04-09 DIAGNOSIS — G9341 Metabolic encephalopathy: Secondary | ICD-10-CM | POA: Diagnosis not present

## 2024-04-09 DIAGNOSIS — R0689 Other abnormalities of breathing: Secondary | ICD-10-CM | POA: Diagnosis not present

## 2024-04-09 DIAGNOSIS — S81811D Laceration without foreign body, right lower leg, subsequent encounter: Secondary | ICD-10-CM | POA: Diagnosis not present

## 2024-04-09 DIAGNOSIS — N390 Urinary tract infection, site not specified: Secondary | ICD-10-CM | POA: Diagnosis not present

## 2024-04-09 LAB — CBC WITH DIFFERENTIAL/PLATELET
Abs Immature Granulocytes: 0.06 10*3/uL (ref 0.00–0.07)
Basophils Absolute: 0 10*3/uL (ref 0.0–0.1)
Basophils Relative: 0 %
Eosinophils Absolute: 0.1 10*3/uL (ref 0.0–0.5)
Eosinophils Relative: 1 %
HCT: 39 % (ref 36.0–46.0)
Hemoglobin: 12.3 g/dL (ref 12.0–15.0)
Immature Granulocytes: 1 %
Lymphocytes Relative: 21 %
Lymphs Abs: 1.7 10*3/uL (ref 0.7–4.0)
MCH: 33.8 pg (ref 26.0–34.0)
MCHC: 31.5 g/dL (ref 30.0–36.0)
MCV: 107.1 fL — ABNORMAL HIGH (ref 80.0–100.0)
Monocytes Absolute: 0.5 10*3/uL (ref 0.1–1.0)
Monocytes Relative: 6 %
Neutro Abs: 5.4 10*3/uL (ref 1.7–7.7)
Neutrophils Relative %: 71 %
Platelets: 177 10*3/uL (ref 150–400)
RBC: 3.64 MIL/uL — ABNORMAL LOW (ref 3.87–5.11)
RDW: 12.9 % (ref 11.5–15.5)
WBC: 7.7 10*3/uL (ref 4.0–10.5)
nRBC: 0 % (ref 0.0–0.2)

## 2024-04-09 LAB — COMPREHENSIVE METABOLIC PANEL WITH GFR
ALT: 33 U/L (ref 0–44)
AST: 59 U/L — ABNORMAL HIGH (ref 15–41)
Albumin: 3 g/dL — ABNORMAL LOW (ref 3.5–5.0)
Alkaline Phosphatase: 98 U/L (ref 38–126)
Anion gap: 14 (ref 5–15)
BUN: 23 mg/dL (ref 8–23)
CO2: 27 mmol/L (ref 22–32)
Calcium: 9 mg/dL (ref 8.9–10.3)
Chloride: 96 mmol/L — ABNORMAL LOW (ref 98–111)
Creatinine, Ser: 1.25 mg/dL — ABNORMAL HIGH (ref 0.44–1.00)
GFR, Estimated: 42 mL/min — ABNORMAL LOW (ref >60)
Glucose, Bld: 120 mg/dL — ABNORMAL HIGH (ref 70–99)
Potassium: 4.7 mmol/L (ref 3.5–5.1)
Sodium: 137 mmol/L (ref 135–145)
Total Bilirubin: 1.2 mg/dL (ref 0.0–1.2)
Total Protein: 7 g/dL (ref 6.5–8.1)

## 2024-04-09 LAB — URINALYSIS, W/ REFLEX TO CULTURE (INFECTION SUSPECTED)
Bacteria, UA: NONE SEEN
Bilirubin Urine: NEGATIVE
Glucose, UA: NEGATIVE mg/dL
Hgb urine dipstick: NEGATIVE
Ketones, ur: 5 mg/dL — AB
Leukocytes,Ua: NEGATIVE
Nitrite: NEGATIVE
Protein, ur: NEGATIVE mg/dL
Specific Gravity, Urine: 1.013 (ref 1.005–1.030)
pH: 5 (ref 5.0–8.0)

## 2024-04-09 LAB — LACTIC ACID, PLASMA
Lactic Acid, Venous: 1.3 mmol/L (ref 0.5–1.9)
Lactic Acid, Venous: 1.4 mmol/L (ref 0.5–1.9)

## 2024-04-09 LAB — RESP PANEL BY RT-PCR (RSV, FLU A&B, COVID)  RVPGX2
Influenza A by PCR: NEGATIVE
Influenza B by PCR: NEGATIVE
Resp Syncytial Virus by PCR: NEGATIVE
SARS Coronavirus 2 by RT PCR: POSITIVE — AB

## 2024-04-09 MED ORDER — ACETAMINOPHEN 650 MG RE SUPP: 650.0000 mg | Freq: Four times a day (QID) | RECTAL | Status: DC | PRN

## 2024-04-09 MED ORDER — ENSURE PLUS HIGH PROTEIN PO LIQD
237.0000 mL | Freq: Two times a day (BID) | ORAL | Status: DC
  Administered 2024-04-10 – 2024-04-12 (×5): 237 mL via ORAL

## 2024-04-09 MED ORDER — NIRMATRELVIR/RITONAVIR (PAXLOVID) TABLET (RENAL DOSING)
2.0000 | ORAL_TABLET | Freq: Two times a day (BID) | ORAL | Status: DC
  Administered 2024-04-09 – 2024-04-10 (×3): 2 via ORAL
  Filled 2024-04-09: qty 20

## 2024-04-09 MED ORDER — PANTOPRAZOLE SODIUM 40 MG PO TBEC
40.0000 mg | DELAYED_RELEASE_TABLET | Freq: Every day | ORAL | Status: DC
  Administered 2024-04-10 – 2024-04-12 (×3): 40 mg via ORAL
  Filled 2024-04-09 (×3): qty 1

## 2024-04-09 MED ORDER — SODIUM CHLORIDE 0.9 % IV SOLN
2.0000 g | Freq: Once | INTRAVENOUS | Status: AC
  Administered 2024-04-09: 2 g via INTRAVENOUS
  Filled 2024-04-09: qty 12.5

## 2024-04-09 MED ORDER — SODIUM CHLORIDE 0.9 % IV SOLN
2.0000 g | INTRAVENOUS | Status: DC
  Administered 2024-04-10 – 2024-04-11 (×2): 2 g via INTRAVENOUS
  Filled 2024-04-09 (×2): qty 20

## 2024-04-09 MED ORDER — LACTATED RINGERS IV BOLUS (SEPSIS)
500.0000 mL | Freq: Once | INTRAVENOUS | Status: AC
  Administered 2024-04-09: 500 mL via INTRAVENOUS

## 2024-04-09 MED ORDER — VANCOMYCIN HCL 750 MG/150ML IV SOLN
750.0000 mg | INTRAVENOUS | Status: DC
  Administered 2024-04-11: 750 mg via INTRAVENOUS
  Filled 2024-04-09 (×2): qty 150

## 2024-04-09 MED ORDER — POLYETHYLENE GLYCOL 3350 17 G PO PACK: 17.0000 g | PACK | Freq: Every day | ORAL | Status: DC | PRN

## 2024-04-09 MED ORDER — PREGABALIN 50 MG PO CAPS
100.0000 mg | ORAL_CAPSULE | Freq: Two times a day (BID) | ORAL | Status: DC
  Administered 2024-04-09 – 2024-04-12 (×6): 100 mg via ORAL
  Filled 2024-04-09 (×6): qty 2

## 2024-04-09 MED ORDER — ACETAMINOPHEN 325 MG PO TABS
650.0000 mg | ORAL_TABLET | Freq: Four times a day (QID) | ORAL | Status: DC | PRN
  Administered 2024-04-10 – 2024-04-12 (×2): 650 mg via ORAL
  Filled 2024-04-09 (×2): qty 2

## 2024-04-09 MED ORDER — PRAMIPEXOLE DIHYDROCHLORIDE 0.25 MG PO TABS
0.2500 mg | ORAL_TABLET | Freq: Every day | ORAL | Status: DC
  Administered 2024-04-09 – 2024-04-11 (×3): 0.25 mg via ORAL
  Filled 2024-04-09 (×4): qty 1

## 2024-04-09 MED ORDER — LACTATED RINGERS IV BOLUS (SEPSIS)
1000.0000 mL | Freq: Once | INTRAVENOUS | Status: AC
  Administered 2024-04-09: 1000 mL via INTRAVENOUS

## 2024-04-09 MED ORDER — LACTATED RINGERS IV SOLN: INTRAVENOUS | Status: DC

## 2024-04-09 MED ORDER — METRONIDAZOLE 500 MG/100ML IV SOLN
500.0000 mg | Freq: Once | INTRAVENOUS | Status: AC
  Administered 2024-04-09: 500 mg via INTRAVENOUS
  Filled 2024-04-09: qty 100

## 2024-04-09 MED ORDER — DAKINS (1/4 STRENGTH) 0.125 % EX SOLN
Freq: Every day | CUTANEOUS | Status: AC
  Filled 2024-04-09: qty 473

## 2024-04-09 MED ORDER — ENOXAPARIN SODIUM 30 MG/0.3ML IJ SOSY
30.0000 mg | PREFILLED_SYRINGE | INTRAMUSCULAR | Status: DC
  Administered 2024-04-09 – 2024-04-11 (×3): 30 mg via SUBCUTANEOUS
  Filled 2024-04-09 (×3): qty 0.3

## 2024-04-09 MED ORDER — VANCOMYCIN HCL IN DEXTROSE 1-5 GM/200ML-% IV SOLN
1000.0000 mg | Freq: Once | INTRAVENOUS | Status: AC
  Administered 2024-04-09: 1000 mg via INTRAVENOUS
  Filled 2024-04-09 (×2): qty 200

## 2024-04-09 NOTE — ED Provider Notes (Signed)
  EMERGENCY DEPARTMENT AT [facility] Provider Note   CSN: [id number] Arrival date & time: 04/09/24  1253     History  Chief Complaint  Patient presents with   Weakness    Anne Anthony is a 88 y.o. female.  88 year old female with a history of atrial fibrillation, tachybradycardia syndrome status post Medtronic pacemaker, hypertension, hyperlipidemia, and CHF who presents emergency department due to weakness.  History obtained per patient's daughters.  Reports that over the past 4 days has been weaker than usual.  Unable to get out of bed.  Lives at home with them.  Does have a sacral wound that has had some foul-smelling discharge from it as well.  Also was diagnosed with a urinary tract infection on Friday but has not yet gotten the antibiotics.  I think that her oxygen saturations have been low today so decided to bring her in.  No fever, cough, or shortness of breath.        Home Medications Prior to Admission medications   Medication Sig Start Date End Date Taking? Authorizing Provider  acetaminophen  (TYLENOL ) 325 MG tablet Take 1-2 tablets (325-650 mg total) by mouth every 4 (four) hours as needed for fever, headache or mild pain. Patient taking differently: Take 650 mg by mouth as needed for fever, headache or mild pain (pain score 1-3). 11/18/14  Yes Babish, Zoila Hines, PA-C  dronabinol (MARINOL) 2.5 MG capsule Take 2.5 mg by mouth as needed (chemo-induced nausea/vomiting, appetite stimulant). 03/17/24  Yes [provider]  furosemide  (LASIX ) 40 MG tablet Take 40 mg every Monday, Wednesday, and Friday 10/04/23  Yes Croitoru, Mihai, MD  LUMIGAN 0.01 % SOLN Place 1 drop into both eyes at bedtime. 04/16/22  Yes [provider]  meclizine  (ANTIVERT ) 25 MG tablet Take 25 mg by mouth every 6 (six) hours as needed for dizziness. 06/16/21  Yes [provider]  metoprolol  succinate (TOPROL -XL) 25 MG 24 hr tablet Take 25 mg by mouth daily. 02/29/24   Yes [provider]  potassium chloride  SA (KLOR-CON  M) 20 MEQ tablet Take 20 mg every Monday, Wednesday, and Friday along with your Furosemide  10/04/23  Yes Croitoru, Mihai, MD  pramipexole (MIRAPEX) 0.25 MG tablet Take 0.25 mg by mouth at bedtime. 02/15/24  Yes [provider]  pravastatin  (PRAVACHOL ) 40 MG tablet Take 1 tablet (40 mg total) by mouth at bedtime. 10/04/23  Yes Croitoru, Mihai, MD  pregabalin (LYRICA) 100 MG capsule Take 100 mg by mouth 2 (two) times daily. 01/24/24  Yes [provider]  promethazine  (PHENERGAN ) 25 MG tablet Take 25 mg by mouth 2 (two) times daily as needed for nausea or vomiting. 06/20/23  Yes [provider]  RABEprazole (ACIPHEX) 20 MG tablet Take 20 mg by mouth daily. 11/27/23  Yes [provider]  SANTYL 250 UNIT/GM ointment Apply 1 Application topically daily. 04/03/24  Yes [provider]  Vitamin D , Ergocalciferol , (DRISDOL) 1.25 MG (50000 UNIT) CAPS capsule Take 50,000 Units by mouth every 7 (seven) days. 01/25/24  Yes [provider]      Allergies    Morphine  and codeine    Review of Systems   Review of Systems  Physical Exam Updated Vital Signs BP 108/62 (BP Location: Left Arm)   Pulse 83   Temp 98 F (36.7 C)   Resp 16   Ht 5\' 4"  (1.626 m)   Wt 41.9 kg   SpO2 94%   BMI 15.86 kg/m  Physical Exam Vitals and  nursing note reviewed.  Constitutional:      General: She is not in acute distress.    Appearance: She is well-developed.     Comments: Alert and oriented x 3  HENT:     Head: Normocephalic and atraumatic.     Right Ear: External ear normal.     Left Ear: External ear normal.     Nose: Nose normal.  Eyes:     Extraocular Movements: Extraocular movements intact.     Conjunctiva/sclera: Conjunctivae normal.     Pupils: Pupils are equal, round, and reactive to light.  Cardiovascular:     Rate and Rhythm: Normal rate and regular rhythm.     Heart sounds: No murmur  heard. Pulmonary:     Effort: Pulmonary effort is normal. No respiratory distress.     Breath sounds: Normal breath sounds.     Comments: Satting well on room air Musculoskeletal:     Cervical back: Normal range of motion and neck supple.     Right lower leg: No edema.     Left lower leg: No edema.     Comments: Sacral ulcer see image below.  Also has ulceration on the medial aspect of her left leg.  No significant ulcerations of her heels.  Skin:    General: Skin is warm and dry.  Neurological:     Mental Status: She is alert and oriented to person, place, and time. Mental status is at baseline.  Psychiatric:        Mood and Affect: Mood normal.     Sacral wound:  ED Results / Procedures / Treatments   Labs (all labs ordered are listed, but only abnormal results are displayed) Labs Reviewed  RESP PANEL BY RT-PCR (RSV, FLU A&B, COVID)  RVPGX2 - Abnormal; Notable for the following components:      Result Value   SARS Coronavirus 2 by RT PCR POSITIVE (*)    All other components within normal limits  COMPREHENSIVE METABOLIC PANEL WITH GFR - Abnormal; Notable for the following components:   Chloride 96 (*)    Glucose, Bld 120 (*)    Creatinine, Ser 1.25 (*)    Albumin 3.0 (*)    AST 59 (*)    GFR, Estimated 42 (*)    All other components within normal limits  CBC WITH DIFFERENTIAL/PLATELET - Abnormal; Notable for the following components:   RBC 3.64 (*)    MCV 107.1 (*)    All other components within normal limits  URINALYSIS, W/ REFLEX TO CULTURE (INFECTION SUSPECTED) - Abnormal; Notable for the following components:   Ketones, ur 5 (*)    All other components within normal limits  CULTURE, BLOOD (ROUTINE X 2)  CULTURE, BLOOD (ROUTINE X 2)  LACTIC ACID, PLASMA  LACTIC ACID, PLASMA  BASIC METABOLIC PANEL WITH GFR  CBC    EKG EKG Interpretation Date/Time:  Wednesday Apr 09 2024 13:05:12 EDT Ventricular Rate:  80 PR Interval:  54 QRS Duration:  168 QT  Interval:  469 QTC Calculation: 542 R Axis:   -29  Text Interpretation: AV sequential or dual chamber electronic pacemaker  Confirmed by Shyrl Doyne [phone]) on 04/09/2024 1:39:06 PM  Radiology DG Sacrum/Coccyx Result Date: 04/09/2024 CLINICAL DATA:  [id number] Decubitus ulcer of sacral region [id number] EXAM: SACRUM AND COCCYX - 2+ VIEW COMPARISON:  Right x-ray pelvis 11/16/2014 FINDINGS: Limited evaluation due to overlapping osseous structures and overlying soft tissues. There is no evidence of fracture or other focal bone lesions. Total  left hip arthroplasty. Atherosclerotic plaque. IMPRESSION: Limited evaluation due to overlapping osseous structures and overlying soft tissues. Total left hip arthroplasty. Electronically Signed   By: Morgane  Naveau M.D.   On: 04/09/2024 19:52   DG Chest Port 1 View Result Date: 04/09/2024 CLINICAL DATA:  Concern for sepsis.  Weakness. EXAM: PORTABLE CHEST 1 VIEW COMPARISON:  09/30/2023. FINDINGS: Similar large hiatal or diaphragmatic hernia is again noted containing and thoracic stomach and loops of bowel. Stable cardiomediastinal contours. Stable left chest wall pacemaker. Aortic atherosclerosis. Left greater than right bibasilar atelectasis. No focal consolidation, sizeable pleural effusion, or pneumothorax. Diffuse osseous demineralization. No acute osseous abnormality. IMPRESSION: 1. Similar large hiatal or diaphragmatic hernia. 2. Left greater than right bibasilar atelectasis. Electronically Signed   By: Mannie Seek M.D.   On: 04/09/2024 15:26    Procedures Procedures    Medications Ordered in ED Medications  sodium hypochlorite (DAKIN'S 1/4 STRENGTH) topical solution ( Irrigation Given 04/09/24 1830)  lactated ringers  infusion ( Intravenous Rate/Dose Change 04/09/24 1955)  cefTRIAXone (ROCEPHIN) 2 g in sodium chloride  0.9 % 100 mL IVPB (has no administration in time range)  pramipexole (MIRAPEX) tablet 0.25 mg (0.25 mg Oral Given 04/09/24 2203)   pregabalin (LYRICA) capsule 100 mg (100 mg Oral Given 04/09/24 2149)  pantoprazole  (PROTONIX ) EC tablet 40 mg (has no administration in time range)  enoxaparin  (LOVENOX ) injection 30 mg (30 mg Subcutaneous Given 04/09/24 2148)  acetaminophen  (TYLENOL ) tablet 650 mg (has no administration in time range)    Or  acetaminophen  (TYLENOL ) suppository 650 mg (has no administration in time range)  polyethylene glycol (MIRALAX  / GLYCOLAX ) packet 17 g (has no administration in time range)  nirmatrelvir/ritonavir (renal dosing) (PAXLOVID) 2 tablet (2 tablets Oral Given 04/09/24 2203)  feeding supplement (ENSURE PLUS HIGH PROTEIN) liquid 237 mL (has no administration in time range)  vancomycin (VANCOREADY) IVPB 750 mg/150 mL (has no administration in time range)  lactated ringers  bolus 1,000 mL (0 mLs Intravenous Stopped 04/09/24 1430)    And  lactated ringers  bolus 500 mL (0 mLs Intravenous Stopped 04/09/24 1513)  ceFEPIme (MAXIPIME) 2 g in sodium chloride  0.9 % 100 mL IVPB (0 g Intravenous Stopped 04/09/24 1513)  metroNIDAZOLE (FLAGYL) IVPB 500 mg (0 mg Intravenous Stopped 04/09/24 1514)  vancomycin (VANCOCIN) IVPB 1000 mg/200 mL premix (1,000 mg Intravenous New Bag/Given 04/09/24 1514)    ED Course/ Medical Decision Making/ A&P Clinical Course as of 04/09/24 2240  Wed Apr 09, 2024  1424 Discussed with Dr. Larrie Po regarding the sacral wound and evaluation for debridement.  Recommends having wound care nursing see the patient first and then they will reach out to him if sharp debridement is necessary. [RP]  1542 Dr Quintella Buck from hospitalist to admit the patient. [RP]    Clinical Course User Index [RP] Ninetta Basket, MD                                 Medical Decision Making Amount and/or Complexity of Data Reviewed Labs: ordered. Radiology: ordered.  Risk Prescription drug management. Decision regarding hospitalization.   MOIRA UMHOLTZ is a 88 y.o. female with comorbidities that  complicate the patient evaluation including atrial fibrillation, tachybradycardia syndrome status post Medtronic pacemaker, hypertension, hyperlipidemia, and CHF who presents emergency department due to weakness.   Initial Ddx:  Pneumonia, URI, UTI, infected sacral wound  MDM/Course:  Patient presents emergency department with generalized weakness.  Also has some  foul-smelling discharge from her sacral wound as well as low oxygen saturations at home.  On exam is hypotensive, otherwise not in acute distress.  Does have some drainage from her sacral ulcer.  Was started on broad-spectrum antibiotics and given IV fluid bolus due to concerns for possible septic shock.  Her lactic acid was WNL however.  She was found to have COVID.  Chest x-ray without pneumonia.  Admitted to hospitalist for her COVID as well as her sacral wound.   This patient presents to the ED for concern of complaints listed in HPI, this involves an extensive number of treatment options, and is a complaint that carries with it a high risk of complications and morbidity. Disposition including potential need for admission considered.   Dispo: Admit to Floor  Additional history obtained from daughter Records reviewed Outpatient Clinic Notes The following labs were independently interpreted: Urinalysis and show no acute abnormality I independently reviewed the following imaging with scope of interpretation limited to determining acute life threatening conditions related to emergency care: Chest x-ray and agree with the radiologist interpretation with the following exceptions: none I personally reviewed and interpreted cardiac monitoring: normal sinus rhythm  I personally reviewed and interpreted the pt's EKG: see above for interpretation  I have reviewed the patients home medications and made adjustments as needed Consults: Hospitalist and General surgery Social Determinants of health:  Geriatric  Portions of this note were  generated with Scientist, clinical (histocompatibility and immunogenetics). Dictation errors may occur despite best attempts at proofreading.     Final Clinical Impression(s) / ED Diagnoses Final diagnoses:  Generalized weakness  COVID  Wound of sacral region, initial encounter    Rx / DC Orders ED Discharge Orders     None         Ninetta Basket, MD 04/09/24 2240

## 2024-04-09 NOTE — Consult Note (Signed)
 WOC Nurse Consult Note: Reason for Consult:Unstageable pressure injury to sacrum.  Awaiting surgical consult.  Wound type: unstageable pressure injury Pressure Injury POA: Yes Measurement: to be obtained on RN assessment and placed in flow sheet Wound bed:100% devitalized tissue.  Periwound:erythema Dressing procedure/placement/frequency: Cleanse sacral wound with NS and pat dry. Apply Dakins moist gauze x 3 days to soften slough. Awaiting surgical consult.  Will not follow at this time.  Please re-consult if needed.  Branda Cain MSN, RN, FNP-BC CWON Wound, Ostomy, Continence Nurse Outpatient [facility] [phone] Pager [phone]

## 2024-04-09 NOTE — ED Triage Notes (Signed)
 From home. Pt c/o of weakness x 4 days.

## 2024-04-09 NOTE — H&P (Signed)
 History and Physical    Anne Anthony ZOX:[id number] DOB: Oct 20, 1936 DOA: 04/09/2024  PCP: Minus Amel, MD   Patient coming from: Home  I have personally briefly reviewed patient's old medical records in [facility]  Chief Complaint: Weakness  HPI: Anne Anthony is a 88 y.o. female with medical history significant for atrial flutter, hypertension, diastolic CHF, tachybradycardia syndrome with pacemaker, CKD, restrictive lung disease, depression. Patient was brought to the ED with reports of progressive weakness, and wound to her lower back.  Patient was hospitalized at [facility]- 4/9 to 4/17 for acute metabolic encephalopathy secondary to pneumonia.  She had jerking movements during hospitalization, neurology was consulted, EEG was completed but Keppra was not explicitly recommended.  To follow-up with neurology as outpatient.  Weakness started just prior to hospitalization at [facility], and has progressed.  Prior to hospitalization, patient lived alone, was able to ambulate, now she is lies in bed, requiring transfer.  She had a pressure ulcer that started during that hospitalization.  Family reports wound has been draining for a while, but appears deeper and back, with a foul smell.  Due to reports of mild confusion, patient had UA done- 5/23 as outpatient, was diagnosed with UTI, due to the holiday, antibiotics were not prescribed.  Patient and family deny dysuria or change in urinary habits. Family reports onset of cough here in the ED.  No fevers at home.  No difficulty breathing. At baseline she has no memory problems, and mental status is intact.  ED Course: Temperature 98.3.  Heart rate 70s.  Respirate rate 17-20.  Blood pressure 83-101.  O2 sats greater than 94% on room air. COVID test positive. Lactic acid 1.3 > 1.4. WBC 7.7. Chest x-ray-left greater than right bibasilar atelectasis. UA not suggestive of UTI. Blood Cultures obtained. 1.5 L bolus given with  improvement in blood pressure, 150 cc/h's LR fluids started. Vanc, cefepime  and metronidazole  started.  Review of Systems: As per HPI all other systems reviewed and negative.  Past Medical History:  Diagnosis Date   Atrial flutter (HCC)    successful radiofrequency ablation 2007   Diverticula of colon    Family history of colon cancer    brother   Hiatal hernia    Hyperlipidemia    Hypertension    Pacemaker    Paroxysmal atrial fibrillation (HCC)    Severe protein-calorie malnutrition (HCC) 11/17/2014   Tachycardia-bradycardia syndrome (HCC)    permament pacermaker, dual chamber Medtronic on 06/2011   Tubular adenoma     Past Surgical History:  Procedure Laterality Date   ABDOMINAL SURGERY     CARDIOVERSION [name] 05/29/2023   Procedure: CARDIOVERSION;  Surgeon: Luana Rumple, MD;  Location: [facility] INVASIVE CV LAB;  Service: Cardiovascular;  Laterality: [name];   COLONOSCOPY  08/17/2010   Dr. Riley Cheadle- internal hemorrhoids, o/w normal rectum, L sided diverticula, tubular adenoma   ESOPHAGOGASTRODUODENOSCOPY  10/502011   Dr. Riley Cheadle- huge diaphragmatic/hiatial hernia, fundal gland polyps not manipulated o/w noral appearing gastric mucosa, patent pylorus, normal D1 & D2   HIATAL HERNIA REPAIR  2005   HIP ARTHROPLASTY Left 11/16/2014   Procedure: ARTHROPLASTY BIPOLAR HIP;  Surgeon: Bevin Bucks, MD;  Location: [facility] ORS;  Service: Orthopedics;  Laterality: Left;   PARTIAL HYSTERECTOMY     permament pacemaker  06/2011   dual chamber , Medtronic Adapta serial # [id number]  last checked 02/05/2013   RADIOFREQUENCY ABLATION  06/28/2006     reports that she has never smoked. She has never  used smokeless tobacco. She reports that she does not drink alcohol and does not use drugs.  Allergies  Allergen Reactions   Morphine  And Codeine Other (See Comments)    Hallucination    Family History  Problem Relation Age of Onset   Diabetes Mother    Heart disease Mother     Prior to Admission  medications   Medication Sig Start Date End Date Taking? Authorizing Provider  acetaminophen  (TYLENOL ) 325 MG tablet Take 1-2 tablets (325-650 mg total) by mouth every 4 (four) hours as needed for fever, headache or mild pain. Patient taking differently: Take 650 mg by mouth as needed for fever, headache or mild pain (pain score 1-3). 11/18/14  Yes Babish, Zoila Hines, PA-C  dronabinol (MARINOL) 2.5 MG capsule Take 2.5 mg by mouth as needed (chemo-induced nausea/vomiting, appetite stimulant). 03/17/24  Yes [provider]  furosemide  (LASIX ) 40 MG tablet Take 40 mg every Monday, Wednesday, and Friday 10/04/23  Yes Croitoru, Mihai, MD  LUMIGAN 0.01 % SOLN Place 1 drop into both eyes at bedtime. 04/16/22  Yes [provider]  meclizine  (ANTIVERT ) 25 MG tablet Take 25 mg by mouth every 6 (six) hours as needed for dizziness. 06/16/21  Yes [provider]  metoprolol  succinate (TOPROL -XL) 25 MG 24 hr tablet Take 25 mg by mouth daily. 02/29/24  Yes [provider]  potassium chloride  SA (KLOR-CON  M) 20 MEQ tablet Take 20 mg every Monday, Wednesday, and Friday along with your Furosemide  10/04/23  Yes Croitoru, Mihai, MD  pramipexole (MIRAPEX) 0.25 MG tablet Take 0.25 mg by mouth at bedtime. 02/15/24  Yes [provider]  pravastatin  (PRAVACHOL ) 40 MG tablet Take 1 tablet (40 mg total) by mouth at bedtime. 10/04/23  Yes Croitoru, Mihai, MD  pregabalin (LYRICA) 100 MG capsule Take 100 mg by mouth 2 (two) times daily. 01/24/24  Yes [provider]  promethazine  (PHENERGAN ) 25 MG tablet Take 25 mg by mouth 2 (two) times daily as needed for nausea or vomiting. 06/20/23  Yes [provider]  RABEprazole (ACIPHEX) 20 MG tablet Take 20 mg by mouth daily. 11/27/23  Yes [provider]  SANTYL 250 UNIT/GM ointment Apply 1 Application topically daily. 04/03/24  Yes [provider]  Vitamin D , Ergocalciferol , (DRISDOL) 1.25 MG (50000 UNIT) CAPS capsule Take  50,000 Units by mouth every 7 (seven) days. 01/25/24  Yes [provider]    Physical Exam: Vitals:   04/09/24 1315 04/09/24 1319 04/09/24 1330  BP: (!) 83/46  (!) 101/49  Pulse: 74    Resp: 18  17  Temp:  98.3 F (36.8 C)   TempSrc:  Rectal   SpO2: 94%      Constitutional: Frail, calm, comfortable Vitals:   04/09/24 1315 04/09/24 1319 04/09/24 1330  BP: (!) 83/46  (!) 101/49  Pulse: 74    Resp: 18  17  Temp:  98.3 F (36.8 C)   TempSrc:  Rectal   SpO2: 94%     Eyes: PERRL, lids and conjunctivae normal ENMT: Mucous membranes are dry. Neck: normal, supple, no masses, no thyromegaly Respiratory: clear to auscultation bilaterally, no wheezing, no crackles. Normal respiratory effort. No accessory muscle use.  Cardiovascular: Regular rate and rhythm, no murmurs / rubs / gallops. No extremity edema.  Extremities warm. Abdomen: no tenderness, no masses palpated. No hepatosplenomegaly. Bowel sounds positive.  Musculoskeletal: no clubbing / cyanosis. No joint deformity upper and lower extremities.  Skin: Sacral decubitus ulcer.   See pictures. Neurologic: No facial  asymmetry, moving extremity spontaneously, speech fluent. Psychiatric: Normal judgment and insight.  Awake and alert.  Rare and occasional confused speech.  Otherwise answers questions appropriately.     Labs on Admission: I have personally reviewed following labs and imaging studies  CBC: Recent Labs  Lab 04/09/24 1310  WBC 7.7  NEUTROABS 5.4  HGB 12.3  HCT 39.0  MCV 107.1*  PLT 177   Basic Metabolic Panel: Recent Labs  Lab 04/09/24 1310  NA 137  K 4.7  CL 96*  CO2 27  GLUCOSE 120*  BUN 23  CREATININE 1.25*  CALCIUM 9.0   GFR: CrCl cannot be calculated (Unknown ideal weight.). Liver Function Tests: Recent Labs  Lab 04/09/24 1310  AST 59*  ALT 33  ALKPHOS 98  BILITOT 1.2  PROT 7.0  ALBUMIN 3.0*   Urine analysis:    Component Value Date/Time   COLORURINE YELLOW 04/09/2024 1407    APPEARANCEUR CLEAR 04/09/2024 1407   LABSPEC 1.013 04/09/2024 1407   PHURINE 5.0 04/09/2024 1407   GLUCOSEU NEGATIVE 04/09/2024 1407   HGBUR NEGATIVE 04/09/2024 1407   BILIRUBINUR NEGATIVE 04/09/2024 1407   KETONESUR 5 (A) 04/09/2024 1407   PROTEINUR NEGATIVE 04/09/2024 1407   UROBILINOGEN 1.0 02/23/2015 2005   NITRITE NEGATIVE 04/09/2024 1407   LEUKOCYTESUR NEGATIVE 04/09/2024 1407    Radiological Exams on Admission: DG Chest Port 1 View Result Date: 04/09/2024 CLINICAL DATA:  Concern for sepsis.  Weakness. EXAM: PORTABLE CHEST 1 VIEW COMPARISON:  09/30/2023. FINDINGS: Similar large hiatal or diaphragmatic hernia is again noted containing and thoracic stomach and loops of bowel. Stable cardiomediastinal contours. Stable left chest wall pacemaker. Aortic atherosclerosis. Left greater than right bibasilar atelectasis. No focal consolidation, sizeable pleural effusion, or pneumothorax. Diffuse osseous demineralization. No acute osseous abnormality. IMPRESSION: 1. Similar large hiatal or diaphragmatic hernia. 2. Left greater than right bibasilar atelectasis. Electronically Signed   By: Mannie Seek M.D.   On: 04/09/2024 15:26   EKG: Independently reviewed.  Paced Rhythm.  Rate 80.  No significant change from prior.  Assessment/Plan Principal Problem:   Decubitus ulcer of sacral region Active Problems:   COVID-19 virus infection   Generalized weakness   Persistent atrial fibrillation (HCC)   Tachycardia-bradycardia syndrome (HCC)   Essential hypertension   Pacemaker   Restrictive lung disease   Depression    Assessment and Plan: * Decubitus ulcer of sacral region Onset of ulcer about 6 weeks ago, ongoing drainage, appears to have a black/necrotic eschar, with mild surrounding erythema-acute versus chronic.  Afebrile without leukocytosis.  Normal lactic acid.  Not septic. -Obtain sacral x-ray -General Surgery consult in a.m. if debridement would be beneficial -Wound Care  consulted, see recs -Continue with IV ceftriaxone and vancomycin - F/u blood cultures Ordered in ED  Generalized weakness Currently positive for COVID.  Likely 2/2 to increasing frailty and debility from advanced age.  Has decubitus ulcer from reduced mobility.  UA not suggestive of UTI. -PT eval.  COVID-19 virus infection Family reports coughing that started here in the ED, otherwise asymptomatic.  No neck weakness.  Chest x-ray with bibasilar atelectasis.  O2 sats 94 to 96% on room air. -Paxlovid  Essential hypertension Blood pressure down to 83 on arrival to the ED, improved with fluids.  1.5 L bolus given. -Continue fluids at reduced rate 75 cc/h for 15 hours -Hold metoprolol  25mg  daily and IV Lasix  40 mg 3 times weekly.    DVT prophylaxis: Lovenox  Code Status: DNR-Universal DNR form at bedside. Family Communication:  Daughters Antoine Bathe and an Engineer, building services at bedside. Disposition Plan: ~ 2 days Consults called: Gen Surg Admission status: Inpt tele  I certify that at the point of admission it is my clinical judgment that the patient will require inpatient hospital care spanning beyond 2 midnights from the point of admission due to high intensity of service, high risk for further deterioration and high frequency of surveillance required.   Author: Pati Bonine, MD 04/09/2024 6:09 PM  For on call review [email or web address].

## 2024-04-09 NOTE — Progress Notes (Signed)
 Pt arrived to room via stretcher from ED. Pt moved to bed by staff. Pt oriented to person only, pleasant and cooperative. Pt with multiple pressure wounds noted on body (see LDA). Pt bathed due to large amount of thick,flaky skin all over body and drainage from some of the wounds. Pt's daughters in with pt, advised of all wounds noted. Daughters were unaware of wound on left lower shin area, states this is new.  Daughter's oriented to room and safety procedures. Call bell within reach. Bed in low position, bed alarm on for safety. Advised will return to complete wound care as ordered. Daughter's state understanding.

## 2024-04-09 NOTE — Assessment & Plan Note (Signed)
 Currently positive for COVID.  Likely 2/2 to increasing frailty and debility from advanced age.  Has decubitus ulcer from reduced mobility.  UA not suggestive of UTI. -PT eval.

## 2024-04-09 NOTE — Assessment & Plan Note (Signed)
 Blood pressure down to 83 on arrival to the ED, improved with fluids.  1.5 L bolus given. -Continue fluids at reduced rate 75 cc/h for 15 hours -Hold metoprolol  25mg  daily and IV Lasix  40 mg 3 times weekly.

## 2024-04-09 NOTE — Progress Notes (Signed)
 Elink is following code sepsis.

## 2024-04-09 NOTE — Consult Note (Addendum)
 Pharmacy Antibiotic Note  ASSESSMENT: 88 y.o. female with PMH including CKD, recent pneumonia requiring hospitalization in April is presenting with sepsis in setting of sacral ulcer. Pharmacy has been consulted to manage vancomycin  dosing. Renal function appears to be at baseline. Patient is also COVID(+) via PCR.  Patient measurements: Height: 5\' 4"  (162.6 cm) Weight: 41.9 kg (92 lb 6 oz) IBW/kg (Calculated) : 54.7  Vital signs: Temp: 98 F (36.7 C) (05/28 2000) Temp Source: Axillary (05/28 1641) BP: 108/62 (05/28 2000) Pulse Rate: 83 (05/28 2000) Recent Labs  Lab 04/09/24 1310  WBC 7.7  CREATININE 1.25*   Estimated Creatinine Clearance: 21.4 mL/min (A) (by C-G formula based on SCr of 1.25 mg/dL (H)).  Allergies: Allergies  Allergen Reactions   Morphine  And Codeine Other (See Comments)    Hallucination    Antimicrobials this admission: Cefepime  5/28 x 1 Metronidazole  5/28 x 1 Ceftriaxone  5/29 >>  Vancomycin  5/28 >>  Dose adjustments this admission: [name]  Microbiology results: 5/28 BCx: in process 5/27 Respiratory panel PCR: COVID(+)  PLAN: Administer vancomycin  1000mg  IV x 1 as a loading dose, followed by vancomycin  750mg  IV q48H thereafter eAUC 499, Cmax 30, Cmin 14 Scr 1.25, TBW, Vd 0.72 L/kg Follow up culture results to assess for antibiotic optimization. Monitor renal function to assess for any necessary antibiotic dosing changes.   Thank you for allowing pharmacy to be a part of this patient's care.  Will M. Alva Jewels, PharmD Clinical Pharmacist 04/09/2024 8:29 PM

## 2024-04-09 NOTE — Assessment & Plan Note (Addendum)
 Onset of ulcer about 6 weeks ago, ongoing drainage, appears to have a black/necrotic eschar, with mild surrounding erythema-acute versus chronic.  Afebrile without leukocytosis.  Normal lactic acid.  Not septic. -Obtain sacral x-ray -General Surgery consult in a.m. if debridement would be beneficial -Wound Care consulted, see recs -Continue with IV ceftriaxone and vancomycin - F/u blood cultures Ordered in ED

## 2024-04-09 NOTE — Assessment & Plan Note (Signed)
 Family reports coughing that started here in the ED, otherwise asymptomatic.  No neck weakness.  Chest x-ray with bibasilar atelectasis.  O2 sats 94 to 96% on room air. -Paxlovid

## 2024-04-09 NOTE — TOC Initial Note (Signed)
 Transition of Care [facility]) - Initial/Assessment Note    Patient Details  Name: Anne Anthony MRN: [id number] Date of Birth: 03-21-1936  Transition of [facility]) CM/SW Contact:    Geraldina Klinefelter, RN Phone Number: 04/09/2024, 10:36 PM  Clinical Narrative:                 Pt from home and plans to return there. Currently living c/daughter. Family is capable and supportive. One of pt's daughters is a retired Lawyer. Pt has hosp bed, Hoyer lift, bsc, wc. She now qualifies for a pressure relieving mattress for the hospital bed. [employer] to deliver. Pt is active c/[facility] RN and PT, and [facility]. Will need transportation home at dc. Daughter states that prior to pneumonia a couple of months ago, pt was independent and lived alone.   Expected Discharge Plan: Home w Home Health Services Barriers to Discharge: Continued Medical Work up  Patient Goals and CMS Choice Patient states their goals for this hospitalization and ongoing recovery are:: Strengthening; Return home.  Expected Discharge Plan and Services In-house Referral: Clinical Social Work Discharge Planning Services: CM Consult   Living arrangements for the past 2 months: Single Family Home     DME Agency: [employer]   HH Agency: [facility]   Prior Living Arrangements/Services Living arrangements for the past 2 months: Single Family Home Lives with:: Adult Children, Self Patient language and need for interpreter reviewed:: Yes Do you feel safe going back to the place where you live?: Yes      Need for Family Participation in Patient Care: Yes (Comment) Care giver support system in place?: Yes (comment) Current home services: Home PT, Home RN Criminal Activity/Legal Involvement Pertinent to Current Situation/Hospitalization: No - Comment as needed  Activities of Daily Living   ADL Screening (condition at time of admission) Independently performs ADLs?: No Does the patient have a NEW difficulty with  bathing/dressing/toileting/self-feeding that is expected to last >3 days?: No Does the patient have a NEW difficulty with getting in/out of bed, walking, or climbing stairs that is expected to last >3 days?: No Does the patient have a NEW difficulty with communication that is expected to last >3 days?: No Is the patient deaf or have difficulty hearing?: No Does the patient have difficulty seeing, even when wearing glasses/contacts?: No Does the patient have difficulty concentrating, remembering, or making decisions?: Yes  Permission Sought/Granted Permission sought to share information with : Case Manager, Family Supports Permission granted to share information with : Yes, Verbal Permission Granted  Emotional Assessment   Attitude/Demeanor/Rapport: Engaged Affect (typically observed): Calm Orientation: : Oriented to Self, Oriented to Place, Oriented to Situation, Oriented to  Time Alcohol / Substance Use: Not Applicable Psych Involvement: No (comment)  Admission diagnosis:  Decubitus ulcer of sacral region [L89.159] Patient Active Problem List   Diagnosis Date Noted   Decubitus ulcer of sacral region 04/09/2024   COVID-19 virus infection 04/09/2024   Generalized weakness 04/09/2024   Hypercoagulable state due to persistent atrial fibrillation (HCC) 05/21/2023   Atrial flutter (HCC) 05/06/2023   New onset of congestive heart failure (HCC) 05/04/2023   Depression 05/04/2023   Peripheral neuropathy 05/04/2023   Acute kidney injury superimposed on chronic kidney disease (HCC) 05/04/2023   Supratherapeutic INR    SAH (subarachnoid hemorrhage) (HCC) 01/31/2018   Pleural effusion on left 01/31/2018   Restrictive lung disease 05/24/2017   Restrictive ventilatory defect 04/28/2016   Nasal congestion 12/01/2014   Postoperative anemia due  to acute blood loss 11/23/2014   Protein-calorie malnutrition, severe (HCC) 11/17/2014   Severe protein-calorie malnutrition (HCC) 11/17/2014    Anticoagulated on Coumadin     Left displaced femoral neck fracture (HCC)    Hip fracture (HCC) 11/14/2014   Closed left hip fracture (HCC) 11/14/2014   Back pain 05/14/2014   Paroxysmal atrial fibrillation with RVR (HCC)    Tachycardia-bradycardia syndrome (HCC)    Essential hypertension    Dyslipidemia    Pacemaker    Persistent atrial fibrillation (HCC) 01/19/2013   Encounter for monitoring amiodarone  therapy 01/19/2013   GERD without esophagitis 08/17/2010   PCP:  Minus Amel, MD Pharmacy:   [facility] [phone] - [place], [place] - [address] [place] [address] Phone: [phone] Fax: [phone]  Social Drivers of Health (SDOH) Social History: SDOH Screenings   Food Insecurity: No Food Insecurity (04/09/2024)  Housing: Low Risk  (04/09/2024)  Transportation Needs: No Transportation Needs (04/09/2024)  Utilities: Not At Risk (04/09/2024)  Financial Resource Strain: Patient Declined (02/15/2024)   Received from [facility]  Social Connections: Unknown (04/09/2024)  Stress: No Stress Concern Present (02/20/2024)   Received from [facility]  Tobacco Use: Low Risk  (04/09/2024)   SDOH Interventions:   Readmission Risk Interventions

## 2024-04-10 ENCOUNTER — Other Ambulatory Visit: Payer: Self-pay | Admitting: Urology

## 2024-04-10 DIAGNOSIS — L89150 Pressure ulcer of sacral region, unstageable: Secondary | ICD-10-CM | POA: Diagnosis not present

## 2024-04-10 DIAGNOSIS — L89154 Pressure ulcer of sacral region, stage 4: Secondary | ICD-10-CM | POA: Diagnosis not present

## 2024-04-10 LAB — CBC
HCT: 28.5 % — ABNORMAL LOW (ref 36.0–46.0)
Hemoglobin: 9.4 g/dL — ABNORMAL LOW (ref 12.0–15.0)
MCH: 34.9 pg — ABNORMAL HIGH (ref 26.0–34.0)
MCHC: 33 g/dL (ref 30.0–36.0)
MCV: 105.9 fL — ABNORMAL HIGH (ref 80.0–100.0)
Platelets: 145 10*3/uL — ABNORMAL LOW (ref 150–400)
RBC: 2.69 MIL/uL — ABNORMAL LOW (ref 3.87–5.11)
RDW: 12.7 % (ref 11.5–15.5)
WBC: 6.8 10*3/uL (ref 4.0–10.5)
nRBC: 0 % (ref 0.0–0.2)

## 2024-04-10 LAB — BASIC METABOLIC PANEL WITH GFR
Anion gap: 10 (ref 5–15)
BUN: 17 mg/dL (ref 8–23)
CO2: 27 mmol/L (ref 22–32)
Calcium: 8.1 mg/dL — ABNORMAL LOW (ref 8.9–10.3)
Chloride: 99 mmol/L (ref 98–111)
Creatinine, Ser: 0.96 mg/dL (ref 0.44–1.00)
GFR, Estimated: 58 mL/min — ABNORMAL LOW (ref >60)
Glucose, Bld: 72 mg/dL (ref 70–99)
Potassium: 3.6 mmol/L (ref 3.5–5.1)
Sodium: 136 mmol/L (ref 135–145)

## 2024-04-10 MED ORDER — LACTATED RINGERS IV BOLUS
500.0000 mL | Freq: Once | INTRAVENOUS | Status: AC
  Administered 2024-04-10: 500 mL via INTRAVENOUS

## 2024-04-10 MED ORDER — OXYCODONE HCL 5 MG PO TABS
5.0000 mg | ORAL_TABLET | ORAL | Status: DC | PRN
  Administered 2024-04-10 – 2024-04-12 (×3): 5 mg via ORAL
  Filled 2024-04-10 (×3): qty 1

## 2024-04-10 NOTE — Evaluation (Signed)
 Physical Therapy Evaluation Patient Details Name: Anne Anthony MRN: [id number] DOB: 28-Jun-1936 Today's Date: 04/10/2024  History of Present Illness  EIMY PLAZA is a 88 y.o. female with medical history significant for atrial flutter, hypertension, diastolic CHF, tachybradycardia syndrome with pacemaker, CKD, restrictive lung disease, depression.  Patient was brought to the ED with reports of progressive weakness, and wound to her lower back.     Patient was hospitalized at [facility]- 4/9 to 4/17 for acute metabolic encephalopathy secondary to pneumonia.  She had jerking movements during hospitalization, neurology was consulted, EEG was completed but Keppra was not explicitly recommended.  To follow-up with neurology as outpatient.     Weakness started just prior to hospitalization at [facility], and has progressed.  Prior to hospitalization, patient lived alone, was able to ambulate, now she is lies in bed, requiring transfer.  She had a pressure ulcer that started during that hospitalization.  Family reports wound has been draining for a while, but appears deeper and back, with a foul smell.  Due to reports of mild confusion, patient had UA done- 5/23 as outpatient, was diagnosed with UTI, due to the holiday, antibiotics were not prescribed.  Patient and family deny dysuria or change in urinary habits.  Family reports onset of cough here in the ED.  No fevers at home.  No difficulty breathing.  At baseline she has no memory problems, and mental status is intact.   Clinical Impression  Patient presents with sacral wound and dressing to bottom, bilateral heel cord contractures/stiffness, demonstrates slow labored movement for sitting up at bedside, had to be lifted to avoid shearing over sacral wound, tolerated standing and taking a couple of side steps before having to sit due to poor standing balance, weakness and bloody drainage from sacral wound. Patient put back to bed due to bleeding from  sacral wound and unit secretary notified to inform patient nurse. Patient will benefit from continued skilled physical therapy in hospital and recommended venue below to increase strength, balance, endurance for safe ADLs and gait.          If plan is discharge home, recommend the following: A lot of help with bathing/dressing/bathroom;A lot of help with walking and/or transfers;Help with stairs or ramp for entrance;Assistance with cooking/housework   Can travel by private vehicle   No    Equipment Recommendations None recommended by PT  Recommendations for Other Services       Functional Status Assessment Patient has had a recent decline in their functional status and demonstrates the ability to make significant improvements in function in a reasonable and predictable amount of time.     Precautions / Restrictions Precautions Precautions: Fall Recall of Precautions/Restrictions: Impaired Precaution/Restrictions Comments: pressure sore over sacrum Restrictions Weight Bearing Restrictions Per Provider Order: No      Mobility  Bed Mobility Overal bed mobility: Needs Assistance Bed Mobility: Supine to Sit, Sit to Supine     Supine to sit: Mod assist, Max assist Sit to supine: Mod assist, Max assist   General bed mobility comments: slow labored movement with c/o increased pain over buttocks, sacrum when sitting    Transfers Overall transfer level: Needs assistance Equipment used: Rolling walker (2 wheels) Transfers: Sit to/from Stand Sit to Stand: Mod assist           General transfer comment: unsteady labored movement    Ambulation/Gait Ambulation/Gait assistance: Mod assist, Max assist Gait Distance (Feet): 3 Feet Assistive device: Rolling walker (2 wheels) Gait Pattern/deviations:  Decreased step length - right, Decreased step length - left, Decreased stride length, Shuffle Gait velocity: slow     General Gait Details: limited to a few shuffling side steps  due to BLE weakness, poor standing balance  Stairs            Wheelchair Mobility     Tilt Bed    Modified Rankin (Stroke Patients Only)       Balance Overall balance assessment: Needs assistance Sitting-balance support: Feet supported, No upper extremity supported Sitting balance-Leahy Scale: Poor Sitting balance - Comments: fair/poor seated at EOB   Standing balance support: Reliant on assistive device for balance, Bilateral upper extremity supported Standing balance-Leahy Scale: Poor Standing balance comment: using RW                             Pertinent Vitals/Pain Pain Assessment Pain Assessment: Faces Faces Pain Scale: Hurts little more Pain Location: over sacral area with pressure Pain Descriptors / Indicators: Sore, Guarding, Grimacing Pain Intervention(s): Limited activity within patient's tolerance, Monitored during session, Repositioned    Home Living Family/patient expects to be discharged to:: Private residence Living Arrangements: Children Available Help at Discharge: Family;Available PRN/intermittently Type of Home: House Home Access: Level entry       Home Layout: One level Home Equipment: Agricultural consultant (2 wheels);Rollator (4 wheels);BSC/3in1;Shower seat;Grab bars - tub/shower;Grab bars - toilet      Prior Function Prior Level of Function : Needs assist       Physical Assist : Mobility (physical);ADLs (physical) Mobility (physical): Bed mobility;Transfers;Gait;Stairs   Mobility Comments: most recently bed bound most of time and assisted transfers to chair, was ambulating without AD less than a year ago ADLs Comments: Assisted by family     Extremity/Trunk Assessment   Upper Extremity Assessment Upper Extremity Assessment: Generalized weakness    Lower Extremity Assessment Lower Extremity Assessment: Generalized weakness    Cervical / Trunk Assessment Cervical / Trunk Assessment: Kyphotic  Communication    Communication Communication: No apparent difficulties    Cognition Arousal: Alert Behavior During Therapy: WFL for tasks assessed/performed   PT - Cognitive impairments: No apparent impairments                         Following commands: Impaired Following commands impaired: Follows one step commands with increased time     Cueing Cueing Techniques: Verbal cues, Tactile cues     General Comments      Exercises     Assessment/Plan    PT Assessment Patient needs continued PT services  PT Problem List Decreased strength;Decreased activity tolerance;Decreased balance;Decreased mobility;Decreased range of motion       PT Treatment Interventions DME instruction;Gait training;Functional mobility training;Therapeutic activities;Therapeutic exercise;Balance training;Patient/family education    PT Goals (Current goals can be found in the Care Plan section)  Acute Rehab PT Goals Patient Stated Goal: return home with family to assist PT Goal Formulation: With patient/family Time For Goal Achievement: 04/24/24 Potential to Achieve Goals: Fair    Frequency Min 3X/week     Co-evaluation               AM-PAC PT "6 Clicks" Mobility  Outcome Measure Help needed turning from your back to your side while in a flat bed without using bedrails?: A Lot Help needed moving from lying on your back to sitting on the side of a flat bed without using bedrails?: A Lot  Help needed moving to and from a bed to a chair (including a wheelchair)?: A Lot Help needed standing up from a chair using your arms (e.g., wheelchair or bedside chair)?: A Lot Help needed to walk in hospital room?: A Lot Help needed climbing 3-5 steps with a railing? : Total 6 Click Score: 11    End of Session   Activity Tolerance: Patient tolerated treatment well;Patient limited by fatigue Patient left: in bed;with call bell/phone within reach;with family/visitor present Nurse Communication: Mobility  status PT Visit Diagnosis: Unsteadiness on feet (R26.81);Other abnormalities of gait and mobility (R26.89);Muscle weakness (generalized) (M62.81)    Time: 1010-1040 PT Time Calculation (min) (ACUTE ONLY): 30 min   Charges:   PT Evaluation $PT Eval Moderate Complexity: 1 Mod PT Treatments $Therapeutic Activity: 23-37 mins PT General Charges $$ ACUTE PT VISIT: 1 Visit         1:57 PM, 04/10/24 [name], MPT Physical Therapist with [facility] [phone] office [phone] mobile phone

## 2024-04-10 NOTE — Hospital Course (Signed)
 Anne Anthony is a 88 y.o. female with medical history significant for atrial flutter, hypertension, diastolic CHF, tachybradycardia syndrome with pacemaker, CKD, restrictive lung disease, depression brought into the hospital with progressive weakness and wound to her lower back.  Of note patient was recently admitted to [facility] hospital between [phone] for acute metabolic encephalopathy secondary to pneumonia.  Had involuntary movements at the time and EEG was done with plans for outpatient neurology follow-up.  Weakness had started prior to hospitalization at [facility] and has been progressively getting worse.  Recently has been lying in bed requiring assistance with transfers but at baseline patient used to live alone and was able to ambulate by herself.  Has had a pressure ulcer that was noted during hospitalization.  It has been draining some recently.  In the ED patient was afebrile.  Patient was tested positive for COVID.  Lactic acid was normal.  Chest x-ray showed left greater than right bibasilar atelectasis.  Urinalysis not suggestive of UTI.  Blood cultures were obtained and patient received IV fluids.  Initially patient was started on vancomycin cefepime and metronidazole as well..   Assessment and Plan: * Decubitus ulcer of sacral region Onset of ulcer around 6 weeks ago with black necrotic eschar.  Afebrile without leukocytosis.  X-ray of the sacrum without any obvious osseous abnormality.  Check general surgery.  Wound care has seen the patient and has unstageable pressure injury of the sacrum.Continue with IV vancomycin, ceftriaxone and metronidazole follow blood cultures. - F/u blood cultures Ordered in ED   Generalized weakness Likely from COVID infection.  Will get PT OT evaluation.   COVID-19 virus infection Family reports coughing that started here in the ED, otherwise asymptomatic.  Chest x-ray with bibasilar atelectasis.  O2 sats 94 to 96% on room air.  On Paxlovid    Essential  hypertension metoprolol  25mg  daily and IV Lasix  40 mg 3 times weekly.

## 2024-04-10 NOTE — Progress Notes (Addendum)
 PROGRESS NOTE  Anne Anthony UYQ:[id number] DOB: 06/09/1936 DOA: 04/09/2024 PCP: Minus Amel, MD   LOS: 1 day   Brief narrative:  Anne Anthony is a 88 y.o. female with medical history significant for atrial flutter, hypertension, diastolic CHF, tachybradycardia syndrome with pacemaker, CKD, restrictive lung disease, depression brought into the hospital with progressive weakness and wound to her lower back.  Of note patient was recently admitted to [facility] hospital between [phone] for acute metabolic encephalopathy secondary to pneumonia.  Had involuntary movements at the time and EEG was done with plans for outpatient neurology follow-up.  Weakness had started prior to hospitalization at [facility] and has been progressively getting worse.  Recently has been lying in bed requiring assistance with transfers but at baseline patient used to live alone and was able to ambulate by herself.  Has had a pressure ulcer that was noted during hospitalization.  It has been draining some recently.  In the ED patient was afebrile.  Patient was tested positive for COVID.  Lactic acid was normal.  Chest x-ray showed left greater than right bibasilar atelectasis.  Urinalysis not suggestive of UTI.  Blood cultures were obtained and patient received IV fluids.  Initially patient was started on vancomycin cefepime and metronidazole as well..   Assessment/Plan: Principal Problem:   Decubitus ulcer of sacral region Active Problems:   COVID-19 virus infection   Generalized weakness   Persistent atrial fibrillation (HCC)   Tachycardia-bradycardia syndrome (HCC)   Essential hypertension   Pacemaker   Restrictive lung disease   Depression  * Decubitus ulcer of sacral region, unstageable Onset of ulcer around 6 weeks ago with black necrotic eschar.  Afebrile without leukocytosis.  X-ray of the sacrum without any obvious osseous abnormality.  Check general surgery with possible need for debridement..  Wound care has  seen the patient and has unstageable pressure injury of the sacrum.Continue with IV vancomycin, ceftriaxone. follow blood cultures.  Generalized weakness Likely from COVID infection.  Will get PT OT evaluation.  He was getting home health PT and lives with her daughter.   COVID-19 virus infection Family reports coughing that started here in the ED, otherwise asymptomatic.  Chest x-ray with bibasilar atelectasis.  O2 sats 94 to 96% on room air.  On Paxlovid   Essential hypertension Currently borderline low blood pressures.  Will continue to hold antihypertensive medications.  Patient is on Lasix  every Monday Wednesday Friday and metoprolol  25 mg daily at home.    DVT prophylaxis: enoxaparin  (LOVENOX ) injection 30 mg Start: 04/09/24 2200   Disposition: Uncertain at this time.  Will get PT OT evaluation.  Status is: Inpatient Remains inpatient appropriate because: Sacral ulceration, IV antibiotic, generalized weakness with possible need for rehabilitation.   Code Status:     Code Status: Limited: Do not attempt resuscitation (DNR) -DNR-LIMITED -Do Not Intubate/DNI   Family Communication: Spoke with 2 daughters at bedside  Consultants: General Surgery  Procedures: None yet  Anti-infectives: Vancomycin Rocephin and Paxlovid  Anti-infectives (From admission, onward)    Start     Dose/Rate Route Frequency Ordered Stop   04/11/24 1000  vancomycin (VANCOREADY) IVPB 750 mg/150 mL        750 mg 150 mL/hr over 60 Minutes Intravenous Every 48 hours 04/09/24 2039     04/10/24 1200  cefTRIAXone (ROCEPHIN) 2 g in sodium chloride  0.9 % 100 mL IVPB        2 g 200 mL/hr over 30 Minutes Intravenous Every 24 hours 04/09/24 1911  04/09/24 2200  nirmatrelvir/ritonavir (renal dosing) (PAXLOVID) 2 tablet        2 tablet Oral 2 times daily 04/09/24 1808 04/14/24 2159   04/09/24 1345  ceFEPIme (MAXIPIME) 2 g in sodium chloride  0.9 % 100 mL IVPB        2 g 200 mL/hr over 30 Minutes Intravenous   Once 04/09/24 1331 04/09/24 1513   04/09/24 1345  metroNIDAZOLE (FLAGYL) IVPB 500 mg        500 mg 100 mL/hr over 60 Minutes Intravenous  Once 04/09/24 1331 04/09/24 1514   04/09/24 1345  vancomycin (VANCOCIN) IVPB 1000 mg/200 mL premix        1,000 mg 200 mL/hr over 60 Minutes Intravenous  Once 04/09/24 1331 04/09/24 1614      Subjective: Today, patient was seen and examined at bedside.  Patient feels okay.  Denies any overt pain but has mild cough as per the family.  Objective: Vitals:   04/10/24 0442 04/10/24 0515  BP: (!) 98/45 (!) 100/45  Pulse: 72   Resp: 19   Temp: 98.5 F (36.9 C)   SpO2: 94%     Intake/Output Summary (Last 24 hours) at 04/10/2024 0910 Last data filed at 04/10/2024 0527 Gross per 24 hour  Intake 2980 ml  Output 1050 ml  Net 1930 ml   Filed Weights   04/09/24 2000  Weight: 41.9 kg   Body mass index is 15.86 kg/m.   Physical Exam:  GENERAL: Patient is alert awake and Communicative.  Oriented to place and person.  Thinly built. HENT: No scleral pallor or icterus. Pupils equally reactive to light. Oral mucosa is moist NECK: is supple, no gross swelling noted. CHEST:   Diminished breath sounds bilaterally. CVS: S1 and S2 heard, no murmur. Regular rate and rhythm.  ABDOMEN: Soft, non-tender, bowel sounds are present. EXTREMITIES: No edema. CNS: Cranial nerves are intact.  Moves all extremities. SKIN: warm and dry without rashes.  Unstageable sacral ulceration present on admission.  Ulcerations over the heel buttocks and legs as in the picture.        Data Review: I have personally reviewed the following laboratory data and studies,  CBC: Recent Labs  Lab 04/09/24 1310 04/10/24 0459  WBC 7.7 6.8  NEUTROABS 5.4  --   HGB 12.3 9.4*  HCT 39.0 28.5*  MCV 107.1* 105.9*  PLT 177 145*   Basic Metabolic Panel: Recent Labs  Lab 04/09/24 1310 04/10/24 0459  NA 137 136  K 4.7 3.6  CL 96* 99  CO2 27 27  GLUCOSE 120* 72  BUN 23 17   CREATININE 1.25* 0.96  CALCIUM 9.0 8.1*   Liver Function Tests: Recent Labs  Lab 04/09/24 1310  AST 59*  ALT 33  ALKPHOS 98  BILITOT 1.2  PROT 7.0  ALBUMIN 3.0*   No results for input(s): "LIPASE", "AMYLASE" in the last 168 hours. No results for input(s): "AMMONIA" in the last 168 hours. Cardiac Enzymes: No results for input(s): "CKTOTAL", "CKMB", "CKMBINDEX", "TROPONINI" in the last 168 hours. BNP (last 3 results) Recent Labs    05/03/23 2225  BNP 638.3*    ProBNP (last 3 results) No results for input(s): "PROBNP" in the last 8760 hours.  CBG: No results for input(s): "GLUCAP" in the last 168 hours. Recent Results (from the past 240 hours)  Blood Culture (routine x 2)     Status: None (Preliminary result)   Collection Time: 04/09/24  1:10 PM   Specimen: BLOOD  Result Value  Ref Range Status   Specimen Description BLOOD RIGHT ARM  Final   Special Requests   Final    BOTTLES DRAWN AEROBIC AND ANAEROBIC Blood Culture results may not be optimal due to an inadequate volume of blood received in culture bottles   Culture   Final    NO GROWTH < 24 HOURS Performed at [facility], [address] [place], [place] [address]    Report Status PENDING  Incomplete  Blood Culture (routine x 2)     Status: None (Preliminary result)   Collection Time: 04/09/24  1:13 PM   Specimen: BLOOD  Result Value Ref Range Status   Specimen Description BLOOD LEFT ARM  Final   Special Requests   Final    BOTTLES DRAWN AEROBIC AND ANAEROBIC Blood Culture adequate volume   Culture   Final    NO GROWTH < 24 HOURS Performed at [facility], [address] [place], [place] [address]    Report Status PENDING  Incomplete  Resp panel by RT-PCR (RSV, Flu A&B, Covid) Anterior Nasal Swab     Status: Abnormal   Collection Time: 04/09/24  1:54 PM   Specimen: Anterior Nasal Swab  Result Value Ref Range Status   SARS Coronavirus 2 by RT PCR POSITIVE (A) NEGATIVE Final    Comment: (NOTE) SARS-CoV-2  target nucleic acids are DETECTED.  The SARS-CoV-2 RNA is generally detectable in upper respiratory specimens during the acute phase of infection. Positive results are indicative of the presence of the identified virus, but do not rule out bacterial infection or co-infection with other pathogens not detected by the test. Clinical correlation with patient history and other diagnostic information is necessary to determine patient infection status. The expected result is Negative.  Fact Sheet for Patients: BloggerCourse.com  Fact Sheet for Healthcare Providers: SeriousBroker.it  This test is not yet approved or cleared by the [place]  FDA and  has been authorized for detection and/or diagnosis of SARS-CoV-2 by FDA under an Emergency Use Authorization (EUA).  This EUA will remain in effect (meaning this test can be used) for the duration of  the COVID-19 declaration under Section 564(b)(1) of the A ct, 21 U.S.C. section 360bbb-3(b)(1), unless the authorization is terminated or revoked sooner.     Influenza A by PCR NEGATIVE NEGATIVE Final   Influenza B by PCR NEGATIVE NEGATIVE Final    Comment: (NOTE) The Xpert Xpress SARS-CoV-2/FLU/RSV plus assay is intended as an aid in the diagnosis of influenza from Nasopharyngeal swab specimens and should not be used as a sole basis for treatment. Nasal washings and aspirates are unacceptable for Xpert Xpress SARS-CoV-2/FLU/RSV testing.  Fact Sheet for Patients: BloggerCourse.com  Fact Sheet for Healthcare Providers: SeriousBroker.it  This test is not yet approved or cleared by the [place]  FDA and has been authorized for detection and/or diagnosis of SARS-CoV-2 by FDA under an Emergency Use Authorization (EUA). This EUA will remain in effect (meaning this test can be used) for the duration of the COVID-19 declaration under Section  564(b)(1) of the Act, 21 U.S.C. section 360bbb-3(b)(1), unless the authorization is terminated or revoked.     Resp Syncytial Virus by PCR NEGATIVE NEGATIVE Final    Comment: (NOTE) Fact Sheet for Patients: BloggerCourse.com  Fact Sheet for Healthcare Providers: SeriousBroker.it  This test is not yet approved or cleared by the [place]  FDA and has been authorized for detection and/or diagnosis of SARS-CoV-2 by FDA under an Emergency Use Authorization (EUA). This EUA will remain in  effect (meaning this test can be used) for the duration of the COVID-19 declaration under Section 564(b)(1) of the Act, 21 U.S.C. section 360bbb-3(b)(1), unless the authorization is terminated or revoked.  Performed at [facility], [address], [place], [place] [address]      Studies: DG Sacrum/Coccyx Result Date: 04/09/2024 CLINICAL DATA:  191478 Decubitus ulcer of sacral region [id number] EXAM: SACRUM AND COCCYX - 2+ VIEW COMPARISON:  Right x-ray pelvis 11/16/2014 FINDINGS: Limited evaluation due to overlapping osseous structures and overlying soft tissues. There is no evidence of fracture or other focal bone lesions. Total left hip arthroplasty. Atherosclerotic plaque. IMPRESSION: Limited evaluation due to overlapping osseous structures and overlying soft tissues. Total left hip arthroplasty. Electronically Signed   By: Morgane  Naveau M.D.   On: 04/09/2024 19:52   DG Chest Port 1 View Result Date: 04/09/2024 CLINICAL DATA:  Concern for sepsis.  Weakness. EXAM: PORTABLE CHEST 1 VIEW COMPARISON:  09/30/2023. FINDINGS: Similar large hiatal or diaphragmatic hernia is again noted containing and thoracic stomach and loops of bowel. Stable cardiomediastinal contours. Stable left chest wall pacemaker. Aortic atherosclerosis. Left greater than right bibasilar atelectasis. No focal consolidation, sizeable pleural effusion, or pneumothorax. Diffuse osseous  demineralization. No acute osseous abnormality. IMPRESSION: 1. Similar large hiatal or diaphragmatic hernia. 2. Left greater than right bibasilar atelectasis. Electronically Signed   By: Mannie Seek M.D.   On: 04/09/2024 15:26      Rosena Conradi, MD  [facility] 04/10/2024  If 7PM-7AM, please contact night-coverage

## 2024-04-10 NOTE — TOC Progression Note (Signed)
 Transition of Care [facility]) - Progression Note    Patient Details  Name: Anne Anthony MRN: [id number] Date of Birth: [birth year]  [facility] CM/SW Contact  [place] Lax, [place] Phone Number: 04/10/2024, 11:08 AM  Clinical Narrative:    CSW updated that PT is recommending SNF for pt. CSW spoke with pts daughter who is at bedside, they are now agreeable to SNF placement. [facility] SNF [facility]. CSW explained that referral can be sent out locally for review with first choice known. Family is agreeable. CSW to send out referral. TOC signing off.   Expected Discharge Plan: Home w Home Health Services Barriers to Discharge: Continued Medical Work up  Expected Discharge Plan and Services In-house Referral: Clinical Social Work Discharge Planning Services: CM Consult   Living arrangements for the past 2 months: Single Family Home                   DME Agency: [employer]         HH Agency: [facility]         Social Determinants of Health (SDOH) Interventions SDOH Screenings   Food Insecurity: No Food Insecurity (04/09/2024)  Housing: Low Risk  (04/09/2024)  Transportation Needs: No Transportation Needs (04/09/2024)  Utilities: Not At Risk (04/09/2024)  Financial Resource Strain: Patient Declined (02/15/2024)   Received from [facility]  Social Connections: Unknown (04/09/2024)  Stress: No Stress Concern Present (02/20/2024)   Received from [facility]  Tobacco Use: Low Risk  (04/09/2024)    Readmission Risk Interventions    04/09/2024    9:59 PM  Readmission Risk Prevention Plan  Post Dischage Appt Complete  Medication Screening Complete  Transportation Screening Complete

## 2024-04-10 NOTE — Consult Note (Signed)
 Reason for Consult: Sacral decubitus ulcer Referring Physician: Dr. [name] is an 88 y.o. female.  HPI: Patient is an 88 year old white female who was found on admission to have a sacral decubitus ulcer.  There apparently started approximately 6 weeks ago.  The initial x-rays of the sacrum were negative for any osseous involvement.  Surgery has been asked to debride the eschar.  Past Medical History:  Diagnosis Date   Atrial flutter (HCC)    successful radiofrequency ablation 2007   Diverticula of colon    Family history of colon cancer    brother   Hiatal hernia    Hyperlipidemia    Hypertension    Pacemaker    Paroxysmal atrial fibrillation (HCC)    Severe protein-calorie malnutrition (HCC) 11/17/2014   Tachycardia-bradycardia syndrome (HCC)    permament pacermaker, dual chamber Medtronic on 06/2011   Tubular adenoma     Past Surgical History:  Procedure Laterality Date   ABDOMINAL SURGERY     CARDIOVERSION [name] 05/29/2023   Procedure: CARDIOVERSION;  Surgeon: Luana Rumple, MD;  Location: [facility];  Service: Cardiovascular;  Laterality: [name];   COLONOSCOPY  08/17/2010   Dr. Riley Cheadle- internal hemorrhoids, o/w normal rectum, L sided diverticula, tubular adenoma   ESOPHAGOGASTRODUODENOSCOPY  10/502011   Dr. Riley Cheadle- huge diaphragmatic/hiatial hernia, fundal gland polyps not manipulated o/w noral appearing gastric mucosa, patent pylorus, normal D1 & D2   HIATAL HERNIA REPAIR  2005   HIP ARTHROPLASTY Left 11/16/2014   Procedure: ARTHROPLASTY BIPOLAR HIP;  Surgeon: Bevin Bucks, MD;  Location: [facility];  Service: Orthopedics;  Laterality: Left;   PARTIAL HYSTERECTOMY     permament pacemaker  06/2011   dual chamber , Medtronic Adapta serial # [id number]  last checked 02/05/2013   RADIOFREQUENCY ABLATION  06/28/2006    Family History  Problem Relation Age of Onset   Diabetes Mother    Heart disease Mother     Social History:  reports that she has never  smoked. She has never used smokeless tobacco. She reports that she does not drink alcohol and does not use drugs.  Allergies:  Allergies  Allergen Reactions   Morphine  And Codeine Other (See Comments)    Hallucination    Medications: I have reviewed the patient's current medications. Prior to Admission:  Medications Prior to Admission  Medication Sig Dispense Refill Last Dose/Taking   acetaminophen  (TYLENOL ) 325 MG tablet Take 1-2 tablets (325-650 mg total) by mouth every 4 (four) hours as needed for fever, headache or mild pain. (Patient taking differently: Take 650 mg by mouth as needed for fever, headache or mild pain (pain score 1-3).)   04/08/2024 Morning   dronabinol (MARINOL) 2.5 MG capsule Take 2.5 mg by mouth as needed (chemo-induced nausea/vomiting, appetite stimulant).   Unknown   furosemide  (LASIX ) 40 MG tablet Take 40 mg every Monday, Wednesday, and Friday 45 tablet 3 04/09/2024 Morning   LUMIGAN 0.01 % SOLN Place 1 drop into both eyes at bedtime.   04/08/2024 Bedtime   meclizine  (ANTIVERT ) 25 MG tablet Take 25 mg by mouth every 6 (six) hours as needed for dizziness.   Unknown   metoprolol  succinate (TOPROL -XL) 25 MG 24 hr tablet Take 25 mg by mouth daily.   04/09/2024 Morning   potassium chloride  SA (KLOR-CON  M) 20 MEQ tablet Take 20 mg every Monday, Wednesday, and Friday along with your Furosemide  45 tablet 3 04/09/2024 Morning   pramipexole (MIRAPEX) 0.25 MG tablet Take 0.25 mg by  mouth at bedtime.   04/08/2024 Bedtime   pravastatin  (PRAVACHOL ) 40 MG tablet Take 1 tablet (40 mg total) by mouth at bedtime. 90 tablet 3 04/08/2024 Bedtime   pregabalin (LYRICA) 100 MG capsule Take 100 mg by mouth 2 (two) times daily.   04/09/2024 Morning   promethazine  (PHENERGAN ) 25 MG tablet Take 25 mg by mouth 2 (two) times daily as needed for nausea or vomiting.   Unknown   RABEprazole (ACIPHEX) 20 MG tablet Take 20 mg by mouth daily.   04/09/2024 Morning   SANTYL 250 UNIT/GM ointment Apply 1  Application topically daily.   04/09/2024 Morning   Vitamin D , Ergocalciferol , (DRISDOL) 1.25 MG (50000 UNIT) CAPS capsule Take 50,000 Units by mouth every 7 (seven) days.   04/08/2024 Morning    Results for orders placed or performed during the hospital encounter of 04/09/24 (from the past 48 hours)  Lactic acid, plasma     Status: None   Collection Time: 04/09/24  1:10 PM  Result Value Ref Range   Lactic Acid, Venous 1.3 0.5 - 1.9 mmol/L    Comment: Performed at [facility], [address] [place], [place] [address]  Comprehensive metabolic panel     Status: Abnormal   Collection Time: 04/09/24  1:10 PM  Result Value Ref Range   Sodium 137 135 - 145 mmol/L   Potassium 4.7 3.5 - 5.1 mmol/L   Chloride 96 (L) 98 - 111 mmol/L   CO2 27 22 - 32 mmol/L   Glucose, Bld 120 (H) 70 - 99 mg/dL    Comment: Glucose reference range applies only to samples taken after fasting for at least 8 hours.   BUN 23 8 - 23 mg/dL   Creatinine, Ser 6.04 (H) 0.44 - 1.00 mg/dL   Calcium 9.0 8.9 - 54.0 mg/dL   Total Protein 7.0 6.5 - 8.1 g/dL   Albumin 3.0 (L) 3.5 - 5.0 g/dL   AST 59 (H) 15 - 41 U/L   ALT 33 0 - 44 U/L   Alkaline Phosphatase 98 38 - 126 U/L   Total Bilirubin 1.2 0.0 - 1.2 mg/dL   GFR, Estimated 42 (L) >60 mL/min    Comment: (NOTE) Calculated using the CKD-EPI Creatinine Equation (2021)    Anion gap 14 5 - 15    Comment: Performed at [facility], [address], [place], [place] [address]  CBC with Differential     Status: Abnormal   Collection Time: 04/09/24  1:10 PM  Result Value Ref Range   WBC 7.7 4.0 - 10.5 K/uL   RBC 3.64 (L) 3.87 - 5.11 MIL/uL   Hemoglobin 12.3 12.0 - 15.0 g/dL   HCT 14.7 82.9 - 56.2 %   MCV 107.1 (H) 80.0 - 100.0 fL   MCH 33.8 26.0 - 34.0 pg   MCHC 31.5 30.0 - 36.0 g/dL   RDW 13.0 86.5 - 78.4 %   Platelets 177 150 - 400 K/uL   nRBC 0.0 0.0 - 0.2 %   Neutrophils Relative % 71 %   Neutro Abs 5.4 1.7 - 7.7 K/uL   Lymphocytes Relative 21 %   Lymphs Abs 1.7 0.7  - 4.0 K/uL   Monocytes Relative 6 %   Monocytes Absolute 0.5 0.1 - 1.0 K/uL   Eosinophils Relative 1 %   Eosinophils Absolute 0.1 0.0 - 0.5 K/uL   Basophils Relative 0 %   Basophils Absolute 0.0 0.0 - 0.1 K/uL   Immature Granulocytes 1 %   Abs Immature  Granulocytes 0.06 0.00 - 0.07 K/uL    Comment: Performed at [facility], [address], [place], [place] [address]  Blood Culture (routine x 2)     Status: None (Preliminary result)   Collection Time: 04/09/24  1:10 PM   Specimen: BLOOD  Result Value Ref Range   Specimen Description BLOOD RIGHT ARM    Special Requests      BOTTLES DRAWN AEROBIC AND ANAEROBIC Blood Culture results may not be optimal due to an inadequate volume of blood received in culture bottles   Culture      NO GROWTH < 24 HOURS Performed at [facility], [address] [place], [place] [address]    Report Status PENDING   Blood Culture (routine x 2)     Status: None (Preliminary result)   Collection Time: 04/09/24  1:13 PM   Specimen: BLOOD  Result Value Ref Range   Specimen Description BLOOD LEFT ARM    Special Requests      BOTTLES DRAWN AEROBIC AND ANAEROBIC Blood Culture adequate volume   Culture      NO GROWTH < 24 HOURS Performed at [facility], [address], [place], [place] [address]    Report Status PENDING   Resp panel by RT-PCR (RSV, Flu A&B, Covid) Anterior Nasal Swab     Status: Abnormal   Collection Time: 04/09/24  1:54 PM   Specimen: Anterior Nasal Swab  Result Value Ref Range   SARS Coronavirus 2 by RT PCR POSITIVE (A) NEGATIVE    Comment: (NOTE) SARS-CoV-2 target nucleic acids are DETECTED.  The SARS-CoV-2 RNA is generally detectable in upper respiratory specimens during the acute phase of infection. Positive results are indicative of the presence of the identified virus, but do not rule out bacterial infection or co-infection with other pathogens not detected by the test. Clinical correlation with patient history and other  diagnostic information is necessary to determine patient infection status. The expected result is Negative.  Fact Sheet for Patients: BloggerCourse.com  Fact Sheet for Healthcare Providers: SeriousBroker.it  This test is not yet approved or cleared by the [place]  FDA and  has been authorized for detection and/or diagnosis of SARS-CoV-2 by FDA under an Emergency Use Authorization (EUA).  This EUA will remain in effect (meaning this test can be used) for the duration of  the COVID-19 declaration under Section 564(b)(1) of the A ct, 21 U.S.C. section 360bbb-3(b)(1), unless the authorization is terminated or revoked sooner.     Influenza A by PCR NEGATIVE NEGATIVE   Influenza B by PCR NEGATIVE NEGATIVE    Comment: (NOTE) The Xpert Xpress SARS-CoV-2/FLU/RSV plus assay is intended as an aid in the diagnosis of influenza from Nasopharyngeal swab specimens and should not be used as a sole basis for treatment. Nasal washings and aspirates are unacceptable for Xpert Xpress SARS-CoV-2/FLU/RSV testing.  Fact Sheet for Patients: BloggerCourse.com  Fact Sheet for Healthcare Providers: SeriousBroker.it  This test is not yet approved or cleared by the [place]  FDA and has been authorized for detection and/or diagnosis of SARS-CoV-2 by FDA under an Emergency Use Authorization (EUA). This EUA will remain in effect (meaning this test can be used) for the duration of the COVID-19 declaration under Section 564(b)(1) of the Act, 21 U.S.C. section 360bbb-3(b)(1), unless the authorization is terminated or revoked.     Resp Syncytial Virus by PCR NEGATIVE NEGATIVE    Comment: (NOTE) Fact Sheet for Patients: BloggerCourse.com  Fact Sheet for Healthcare Providers: SeriousBroker.it  This test  is not yet approved or cleared by the [place] and has been authorized for detection and/or diagnosis of SARS-CoV-2 by FDA under an Emergency Use Authorization (EUA). This EUA will remain in effect (meaning this test can be used) for the duration of the COVID-19 declaration under Section 564(b)(1) of the Act, 21 U.S.C. section 360bbb-3(b)(1), unless the authorization is terminated or revoked.  Performed at [facility], [address], [place], [place] [address]   Urinalysis, w/ Reflex to Culture (Infection Suspected) -Urine, Catheterized     Status: Abnormal   Collection Time: 04/09/24  2:07 PM  Result Value Ref Range   Specimen Source URINE, CATHETERIZED    Color, Urine YELLOW YELLOW   APPearance CLEAR CLEAR   Specific Gravity, Urine 1.013 1.005 - 1.030   pH 5.0 5.0 - 8.0   Glucose, UA NEGATIVE NEGATIVE mg/dL   Hgb urine dipstick NEGATIVE NEGATIVE   Bilirubin Urine NEGATIVE NEGATIVE   Ketones, ur 5 (A) NEGATIVE mg/dL   Protein, ur NEGATIVE NEGATIVE mg/dL   Nitrite NEGATIVE NEGATIVE   Leukocytes,Ua NEGATIVE NEGATIVE   RBC / HPF 0-5 0 - 5 RBC/hpf   WBC, UA 0-5 0 - 5 WBC/hpf    Comment:        Reflex urine culture not performed if WBC <=10, OR if Squamous epithelial cells >5. If Squamous epithelial cells >5 suggest recollection.    Bacteria, UA NONE SEEN NONE SEEN   Squamous Epithelial / HPF 0-5 0 - 5 /HPF   Hyaline Casts, UA PRESENT     Comment: Performed at [facility], [address], [place], [place] [address]  Lactic acid, plasma     Status: None   Collection Time: 04/09/24  3:20 PM  Result Value Ref Range   Lactic Acid, Venous 1.4 0.5 - 1.9 mmol/L    Comment: Performed at [facility], [address] [place], [place] [address]  Basic metabolic panel     Status: Abnormal   Collection Time: 04/10/24  4:59 AM  Result Value Ref Range   Sodium 136 135 - 145 mmol/L   Potassium 3.6 3.5 - 5.1 mmol/L   Chloride 99 98 - 111 mmol/L   CO2 27 22 - 32 mmol/L   Glucose, Bld 72 70 - 99 mg/dL    Comment: Glucose  reference range applies only to samples taken after fasting for at least 8 hours.   BUN 17 8 - 23 mg/dL   Creatinine, Ser 9.52 0.44 - 1.00 mg/dL   Calcium 8.1 (L) 8.9 - 10.3 mg/dL   GFR, Estimated 58 (L) >60 mL/min    Comment: (NOTE) Calculated using the CKD-EPI Creatinine Equation (2021)    Anion gap 10 5 - 15    Comment: Performed at [facility] [place] [facility], [address] [place], [place] [address]  CBC     Status: Abnormal   Collection Time: 04/10/24  4:59 AM  Result Value Ref Range   WBC 6.8 4.0 - 10.5 K/uL   RBC 2.69 (L) 3.87 - 5.11 MIL/uL   Hemoglobin 9.4 (L) 12.0 - 15.0 g/dL   HCT 44.0 (L) 10.2 - 72.5 %   MCV 105.9 (H) 80.0 - 100.0 fL   MCH 34.9 (H) 26.0 - 34.0 pg   MCHC 33.0 30.0 - 36.0 g/dL   RDW 36.6 44.0 - 34.7 %   Platelets 145 (L) 150 - 400 K/uL   nRBC 0.0 0.0 - 0.2 %    Comment: Performed at [facility], [address] [place], [place] [address]    DG Sacrum/Coccyx Result Date: 04/09/2024 CLINICAL DATA:  [id number] Decubitus ulcer of sacral region 245036 EXAM: SACRUM AND COCCYX - 2+ VIEW COMPARISON:  Right x-ray pelvis 11/16/2014 FINDINGS: Limited evaluation due to overlapping osseous structures and overlying soft tissues. There is no evidence of fracture or other focal bone lesions. Total left hip arthroplasty. Atherosclerotic plaque. IMPRESSION: Limited evaluation due to overlapping osseous structures and overlying soft tissues. Total left hip arthroplasty. Electronically Signed   By: Morgane  Naveau M.D.   On: 04/09/2024 19:52   DG Chest Port 1 View Result Date: 04/09/2024 CLINICAL DATA:  Concern for sepsis.  Weakness. EXAM: PORTABLE CHEST 1 VIEW COMPARISON:  09/30/2023. FINDINGS: Similar large hiatal or diaphragmatic hernia is again noted containing and thoracic stomach and loops of bowel. Stable cardiomediastinal contours. Stable left chest wall pacemaker. Aortic atherosclerosis. Left greater than right bibasilar atelectasis. No focal consolidation, sizeable pleural effusion, or  pneumothorax. Diffuse osseous demineralization. No acute osseous abnormality. IMPRESSION: 1. Similar large hiatal or diaphragmatic hernia. 2. Left greater than right bibasilar atelectasis. Electronically Signed   By: Mannie Seek M.D.   On: 04/09/2024 15:26    ROS:  Review of systems not obtained due to patient factors.  Blood pressure (!) 100/45, pulse 72, temperature 98.5 F (36.9 C), temperature source Oral, resp. rate 19, height 5\' 4"  (1.626 m), weight 41.9 kg, SpO2 94%. Physical Exam: Cachectic white female in no acute distress Skin examination.  A 4 to 5 cm oval decubitus ulcer with a soft black eschar is present over the sacrum.  No purulent drainage present.  I sharply debrided the eschar and necrotic tissue with a scalpel.  This did go down to the bone.  Patient tolerated the procedure well.  A dressing was then applied.  Assessment/Plan: Impression: Stage IV sacral decubitus ulcer Plan: Continue dressing changes as prescribed.  Please call us  if we can be of further assistance.  Anne Anthony 04/10/2024, 9:56 AM

## 2024-04-10 NOTE — NC FL2 (Addendum)
 [place]  MEDICAID FL2 LEVEL OF CARE FORM     IDENTIFICATION  Patient Name: Anne Anthony Birthdate: 12-30-1935 Sex: female Admission Date (Current Location): 04/09/2024  [facility] and [place] Number:  [employer] and Address:  [facility],  [address] [name] [address]      Provider Number: [id number]  Attending Physician Name and Address:  Rosena Conradi, MD  Relative Name and Phone Number:       Current Level of Care: Hospital Recommended Level of Care: Skilled Nursing Facility Prior Approval Number:    Date Approved/Denied:   PASRR Number:  [id number]  Discharge Plan: SNF    Current Diagnoses: Patient Active Problem List   Diagnosis Date Noted   Decubitus ulcer of sacral region, stage 4 (HCC) 04/10/2024   Decubitus ulcer of sacral region 04/09/2024   COVID-19 virus infection 04/09/2024   Generalized weakness 04/09/2024   Hypercoagulable state due to persistent atrial fibrillation (HCC) 05/21/2023   Atrial flutter (HCC) 05/06/2023   New onset of congestive heart failure (HCC) 05/04/2023   Depression 05/04/2023   Peripheral neuropathy 05/04/2023   Acute kidney injury superimposed on chronic kidney disease (HCC) 05/04/2023   Supratherapeutic INR    SAH (subarachnoid hemorrhage) (HCC) 01/31/2018   Pleural effusion on left 01/31/2018   Restrictive lung disease 05/24/2017   Restrictive ventilatory defect 04/28/2016   Nasal congestion 12/01/2014   Postoperative anemia due to acute blood loss 11/23/2014   Protein-calorie malnutrition, severe (HCC) 11/17/2014   Severe protein-calorie malnutrition (HCC) 11/17/2014   Anticoagulated on Coumadin     Left displaced femoral neck fracture (HCC)    Hip fracture (HCC) 11/14/2014   Closed left hip fracture (HCC) 11/14/2014   Back pain 05/14/2014   Paroxysmal atrial fibrillation with RVR (HCC)    Tachycardia-bradycardia syndrome (HCC)    Essential hypertension    Dyslipidemia    Pacemaker     Persistent atrial fibrillation (HCC) 01/19/2013   Encounter for monitoring amiodarone  therapy 01/19/2013   GERD without esophagitis 08/17/2010    Orientation RESPIRATION BLADDER Height & Weight     Self  Normal Incontinent Weight: 92 lb 6 oz (41.9 kg) Height:  5\' 4"  (162.6 cm)  BEHAVIORAL SYMPTOMS/MOOD NEUROLOGICAL BOWEL NUTRITION STATUS      Incontinent Diet (Regular)  AMBULATORY STATUS COMMUNICATION OF NEEDS Skin   Extensive Assist Verbally Other (Comment) (medial sacrum unstageable, multiple pressure injuries (heels, pretibial left medial stage 3, foot anterior stage 2, ankle anterior stage 2, lateral left stage 2))                       Personal Care Assistance Level of Assistance  Bathing, Feeding, Dressing Bathing Assistance: Maximum assistance Feeding assistance: Limited assistance Dressing Assistance: Maximum assistance     Functional Limitations Info  Sight, Hearing, Speech Sight Info: Impaired Hearing Info: Adequate Speech Info: Adequate    SPECIAL CARE FACTORS FREQUENCY  PT (By licensed PT), OT (By licensed OT)     PT Frequency: 5 times weekly OT Frequency: 5 times weekly            Contractures Contractures Info: Not present    Additional Factors Info  Code Status, Allergies Code Status Info: DNR-Limited Allergies Info: Morphine  and codeine           Current Medications (04/10/2024):  This is the current hospital active medication list Current Facility-Administered Medications  Medication Dose Route Frequency Provider Last Rate Last Admin   acetaminophen  (TYLENOL ) tablet 650  mg  650 mg Oral Q6H PRN Emokpae, Ejiroghene E, MD   650 mg at 04/10/24 1059   Or   acetaminophen  (TYLENOL ) suppository 650 mg  650 mg Rectal Q6H PRN Emokpae, Ejiroghene E, MD       cefTRIAXone (ROCEPHIN) 2 g in sodium chloride  0.9 % 100 mL IVPB  2 g Intravenous Q24H Emokpae, Ejiroghene E, MD       enoxaparin  (LOVENOX ) injection 30 mg  30 mg Subcutaneous Q24H Emokpae,  Ejiroghene E, MD   30 mg at 04/09/24 2148   feeding supplement (ENSURE PLUS HIGH PROTEIN) liquid 237 mL  237 mL Oral BID BM Emokpae, Ejiroghene E, MD   237 mL at 04/10/24 1008   nirmatrelvir/ritonavir (renal dosing) (PAXLOVID) 2 tablet  2 tablet Oral BID Emokpae, Ejiroghene E, MD   2 tablet at 04/10/24 0817   pantoprazole  (PROTONIX ) EC tablet 40 mg  40 mg Oral Daily Emokpae, Ejiroghene E, MD   40 mg at 04/10/24 6045   polyethylene glycol (MIRALAX  / GLYCOLAX ) packet 17 g  17 g Oral Daily PRN Emokpae, Ejiroghene E, MD       pramipexole (MIRAPEX) tablet 0.25 mg  0.25 mg Oral QHS Emokpae, Ejiroghene E, MD   0.25 mg at 04/09/24 2203   pregabalin (LYRICA) capsule 100 mg  100 mg Oral BID Emokpae, Ejiroghene E, MD   100 mg at 04/10/24 0808   sodium hypochlorite (DAKIN'S 1/4 STRENGTH) topical solution   Irrigation Daily Emokpae, Ejiroghene E, MD   Given at 04/09/24 1830   [START ON 04/11/2024] vancomycin (VANCOREADY) IVPB 750 mg/150 mL  750 mg Intravenous Q48H Madelynn Schilder, [facility]         Discharge Medications: Please see discharge summary for a list of discharge medications.  Relevant Imaging Results:  Relevant Lab Results:   Additional Information SSN: [id number] [place]

## 2024-04-10 NOTE — Plan of Care (Signed)

## 2024-04-10 NOTE — Plan of Care (Signed)
  Problem: Acute Rehab PT Goals(only PT should resolve) Goal: Pt Will Go Supine/Side To Sit Outcome: Progressing Flowsheets (Taken 04/10/2024 1359) Pt will go Supine/Side to Sit:  with minimal assist  with moderate assist Goal: Patient Will Transfer Sit To/From Stand Outcome: Progressing Flowsheets (Taken 04/10/2024 1359) Patient will transfer sit to/from stand:  with minimal assist  with moderate assist Goal: Pt Will Transfer Bed To Chair/Chair To Bed Outcome: Progressing Flowsheets (Taken 04/10/2024 1359) Pt will Transfer Bed to Chair/Chair to Bed:  with min assist  with mod assist Goal: Pt Will Ambulate Outcome: Progressing Flowsheets (Taken 04/10/2024 1359) Pt will Ambulate:  15 feet  with moderate assist  with rolling walker   1:59 PM, 04/10/24 [name], MPT Physical Therapist with [facility] [phone] office [phone] mobile phone

## 2024-04-11 DIAGNOSIS — L89159 Pressure ulcer of sacral region, unspecified stage: Secondary | ICD-10-CM

## 2024-04-11 DIAGNOSIS — L89150 Pressure ulcer of sacral region, unstageable: Secondary | ICD-10-CM | POA: Diagnosis not present

## 2024-04-11 LAB — MAGNESIUM: Magnesium: 1.5 mg/dL — ABNORMAL LOW (ref 1.7–2.4)

## 2024-04-11 LAB — BASIC METABOLIC PANEL WITH GFR
Anion gap: 11 (ref 5–15)
BUN: 15 mg/dL (ref 8–23)
CO2: 30 mmol/L (ref 22–32)
Calcium: 8.1 mg/dL — ABNORMAL LOW (ref 8.9–10.3)
Chloride: 96 mmol/L — ABNORMAL LOW (ref 98–111)
Creatinine, Ser: 1.09 mg/dL — ABNORMAL HIGH (ref 0.44–1.00)
GFR, Estimated: 49 mL/min — ABNORMAL LOW (ref >60)
Glucose, Bld: 62 mg/dL — ABNORMAL LOW (ref 70–99)
Potassium: 3.8 mmol/L (ref 3.5–5.1)
Sodium: 137 mmol/L (ref 135–145)

## 2024-04-11 LAB — CBC
HCT: 27.2 % — ABNORMAL LOW (ref 36.0–46.0)
Hemoglobin: 8.8 g/dL — ABNORMAL LOW (ref 12.0–15.0)
MCH: 34.8 pg — ABNORMAL HIGH (ref 26.0–34.0)
MCHC: 32.4 g/dL (ref 30.0–36.0)
MCV: 107.5 fL — ABNORMAL HIGH (ref 80.0–100.0)
Platelets: 142 10*3/uL — ABNORMAL LOW (ref 150–400)
RBC: 2.53 MIL/uL — ABNORMAL LOW (ref 3.87–5.11)
RDW: 12.9 % (ref 11.5–15.5)
WBC: 6.7 10*3/uL (ref 4.0–10.5)
nRBC: 0 % (ref 0.0–0.2)

## 2024-04-11 NOTE — Plan of Care (Signed)
  Problem: Education: Goal: Knowledge of risk factors and measures for prevention of condition will improve Outcome: Not Progressing   Problem: Respiratory: Goal: Complications related to the disease process, condition or treatment will be avoided or minimized Outcome: Not Progressing   Problem: Health Behavior/Discharge Planning: Goal: Ability to manage health-related needs will improve Outcome: Not Progressing   Problem: Clinical Measurements: Goal: Ability to maintain clinical measurements within normal limits will improve Outcome: Not Progressing

## 2024-04-11 NOTE — Progress Notes (Signed)
    [facility]    Date of Admission:  04/09/2024   Total days of antibiotics         Day 3   ID: Anne Anthony is a 88 y.o. female with  Principal Problem:   Decubitus ulcer of sacral region Active Problems:   Persistent atrial fibrillation (HCC)   Tachycardia-bradycardia syndrome (HCC)   Essential hypertension   Pacemaker   Restrictive lung disease   Depression   COVID-19 virus infection   Generalized weakness   Decubitus ulcer of sacral region, stage 4 (HCC)    HPI:  88yo F with HTN, CKD, restrictive lung disease who is admitted for progressive weakness, worsening wounds and pressure ulcers. They have just started in the last 6 wk. Initial evaluation shows sacral eschar that was debrided by surgery. She remains afebrile. Blood cx ngtd. ID asked to give preliminary treatment regimen for deep tissue infection, possibly early osteomyelitis.   Medications:   enoxaparin  (LOVENOX ) injection  30 mg Subcutaneous Q24H   feeding supplement  237 mL Oral BID BM   pantoprazole   40 mg Oral Daily   pramipexole  0.25 mg Oral QHS   pregabalin  100 mg Oral BID    Objective: Vital signs in last 24 hours: Temp:  [97.6 F (36.4 C)-98.2 F (36.8 C)] 97.6 F (36.4 C) (05/30 1528) Pulse Rate:  [71-73] 73 (05/30 1528) Resp:  [14-17] 17 (05/30 1300) BP: (81-118)/(44-53) 103/46 (05/30 1528) SpO2:  [90 %-96 %] 90 % (05/30 1528)  Reviewed photos in chart Did not physically see patient  Lab Results Recent Labs    04/10/24 0459 04/11/24 0825  WBC 6.8 6.7  HGB 9.4* 8.8*  HCT 28.5* 27.2*  NA 136 137  K 3.6 3.8  CL 99 96*  CO2 27 30  BUN 17 15  CREATININE 0.96 1.09*   Liver Panel Recent Labs    04/09/24 1310  PROT 7.0  ALBUMIN 3.0*  AST 59*  ALT 33  ALKPHOS 98  BILITOT 1.2    Microbiology: reviewed Studies/Results: DG Sacrum/Coccyx Result Date: 04/09/2024 CLINICAL DATA:  [id number] Decubitus ulcer of sacral region 245036 EXAM: SACRUM AND COCCYX  - 2+ VIEW COMPARISON:  Right x-ray pelvis 11/16/2014 FINDINGS: Limited evaluation due to overlapping osseous structures and overlying soft tissues. There is no evidence of fracture or other focal bone lesions. Total left hip arthroplasty. Atherosclerotic plaque. IMPRESSION: Limited evaluation due to overlapping osseous structures and overlying soft tissues. Total left hip arthroplasty. Electronically Signed   By: Morgane  Naveau M.D.   On: 04/09/2024 19:52     Assessment/Plan: Sacral pressure ulcer wound/possible early osteomyelitis s/p debridement. Recommend to discharge of 14 days of amox/clav 875mg  bid plus 10 days of linezolid 600mg  po bid 2. Continue with wound care instructions to help minimize non viable tissue 3. Continue with offload/rotation and increasing nutritional intake to help with healing 4. Would recommend she is referred to wound clinic to help with serial debridement  This is peer-to-peer recommendation  [facility] Pager: [phone]  04/11/2024, 6:13 PM

## 2024-04-11 NOTE — Progress Notes (Signed)
 PROGRESS NOTE    Anne Anthony  ZOX:[id number] DOB: [birth year] DOA: 04/09/2024 PCP: Minus Amel, MD   Brief Narrative:    Anne Anthony is a 88 y.o. female with medical history significant for atrial flutter, hypertension, diastolic CHF, tachybradycardia syndrome with pacemaker, CKD, restrictive lung disease, depression brought into the hospital with progressive weakness and wound to her lower back.  Of note patient was recently admitted to [facility] hospital between [phone] for acute metabolic encephalopathy secondary to pneumonia.  Had involuntary movements at the time and EEG was done with plans for outpatient neurology follow-up.  Weakness had started prior to hospitalization at [facility] and has been progressively getting worse.  Recently has been lying in bed requiring assistance with transfers but at baseline patient used to live alone and was able to ambulate by herself.  Has had a pressure ulcer that was noted during hospitalization.  It has been draining some recently.  In the ED patient was afebrile.  Patient was tested positive for COVID.  Lactic acid was normal.  Chest x-ray showed left greater than right bibasilar atelectasis.  Urinalysis not suggestive of UTI.  Blood cultures were obtained and patient received IV fluids.  Patient underwent bedside debridement of the unstageable sacral ulcer on 5/29.  ID recommending potential treatment for osteomyelitis with recommendations pending.  Patient also awaiting SNF placement.  Assessment & Plan:   Principal Problem:   Decubitus ulcer of sacral region Active Problems:   COVID-19 virus infection   Generalized weakness   Persistent atrial fibrillation (HCC)   Tachycardia-bradycardia syndrome (HCC)   Essential hypertension   Pacemaker   Restrictive lung disease   Depression   Decubitus ulcer of sacral region, stage 4 (HCC)  Assessment and Plan:  Decubitus ulcer of sacral region, unstageable Onset of ulcer around 6 weeks ago with  black necrotic eschar.  Afebrile without leukocytosis.  X-ray of the sacrum without any obvious osseous abnormality.  Check general surgery with possible need for debridement..  Wound care has seen the patient and has unstageable pressure injury of the sacrum.Continue with IV vancomycin, ceftriaxone. follow blood cultures.   Generalized weakness Likely from COVID infection.   - PT/OT recommending SNF placement   COVID-19 virus infection Family reports coughing that started here in the ED, otherwise asymptomatic.  Chest x-ray with bibasilar atelectasis.  O2 sats 94 to 96% on room air.   - Paxlovid now discontinued 5/30   Essential hypertension Currently borderline low blood pressures.  Will continue to hold antihypertensive medications.  Patient is on Lasix  every Monday Wednesday Friday and metoprolol  25 mg daily at home.    DVT prophylaxis:Lovenox  Code Status: DNR Family Communication: Daughter at bedside 5/30 Disposition Plan: Further evaluation per ID pending and SNF placement. Status is: Inpatient Remains inpatient appropriate because: Need for IV medications.   Skin Assessment:  I have examined the patient's skin and I agree with the wound assessment as performed by the wound care RN as outlined below:  Pressure Injury 04/09/24 Sacrum Medial Unstageable - Full thickness tissue loss in which the base of the injury is covered by slough (yellow, tan, gray, green or brown) and/or eschar (tan, brown or black) in the wound bed. 6cm x 4 cm x 1cm - eschar at ce (Active)  04/09/24 1700  Location: Sacrum  Location Orientation: Medial  Staging: Unstageable - Full thickness tissue loss in which the base of the injury is covered by slough (yellow, tan, gray, green or brown) and/or eschar (tan,  brown or black) in the wound bed.  Wound Description (Comments): 6cm x 4 cm x 1cm - eschar at center of wound, slough from all edges to center eschar.  Present on Admission: Yes  Dressing Type Foam -  Lift dressing to assess site every shift 04/10/24 2224     Pressure Injury 04/09/24 Ischial tuberosity Right Stage 2 -  Partial thickness loss of dermis presenting as a shallow open injury with a red, pink wound bed without slough. 1cm x 1 cm x 0 cm - pink base (Active)  04/09/24 1700  Location: Ischial tuberosity  Location Orientation: Right  Staging: Stage 2 -  Partial thickness loss of dermis presenting as a shallow open injury with a red, pink wound bed without slough.  Wound Description (Comments): 1cm x 1 cm x 0 cm - pink base  Present on Admission: Yes  Dressing Type Foam - Lift dressing to assess site every shift 04/10/24 2224     Pressure Injury 04/09/24 Ischial tuberosity Left Stage 2 -  Partial thickness loss of dermis presenting as a shallow open injury with a red, pink wound bed without slough. 1cm x 1cm x 0 cm - pink wound base with scant serous drainage (Active)  04/09/24 1700  Location: Ischial tuberosity  Location Orientation: Left  Staging: Stage 2 -  Partial thickness loss of dermis presenting as a shallow open injury with a red, pink wound bed without slough.  Wound Description (Comments): 1cm x 1cm x 0 cm - pink wound base with scant serous drainage  Present on Admission: Yes  Dressing Type Foam - Lift dressing to assess site every shift 04/10/24 2224     Pressure Injury Heel Left Stage 2 -  Partial thickness loss of dermis presenting as a shallow open injury with a red, pink wound bed without slough. 2cm x 3 cm x 0 cm - pink base, no drainage noted (Active)     Location: Heel  Location Orientation: Left  Staging: Stage 2 -  Partial thickness loss of dermis presenting as a shallow open injury with a red, pink wound bed without slough.  Wound Description (Comments): 2cm x 3 cm x 0 cm - pink base, no drainage noted  Present on Admission: Yes  Dressing Type Foam - Lift dressing to assess site every shift 04/09/24 2000     Pressure Injury Heel Posterior;Right Deep Tissue  Pressure Injury - Purple or maroon localized area of discolored intact skin or blood-filled blister due to damage of underlying soft tissue from pressure and/or shear. Two wounds - lateral (2cm x 2cm x 0 c (Active)     Location: Heel  Location Orientation: Posterior;Right  Staging: Deep Tissue Pressure Injury - Purple or maroon localized area of discolored intact skin or blood-filled blister due to damage of underlying soft tissue from pressure and/or shear.  Wound Description (Comments): Two wounds - lateral (2cm x 2cm x 0 cm) and medial (2cm x 1cm x 0 cm) heel edges -  Present on Admission: Yes  Dressing Type Foam - Lift dressing to assess site every shift 04/09/24 2000     Pressure Injury 04/09/24 Pretibial Left;Medial Stage 3 -  Full thickness tissue loss. Subcutaneous fat may be visible but bone, tendon or muscle are NOT exposed. 4cm x 4cm x 2.5 cm - open wound- moderate bloody drainage-no odor (Active)  04/09/24 1700  Location: Pretibial  Location Orientation: Left;Medial  Staging: Stage 3 -  Full thickness tissue loss. Subcutaneous fat may be  visible but bone, tendon or muscle are NOT exposed.  Wound Description (Comments): 4cm x 4cm x 2.5 cm - open wound- moderate bloody drainage-no odor  Present on Admission: Yes  Dressing Type Foam - Lift dressing to assess site every shift 04/10/24 2224     Pressure Injury 04/09/24 Foot Anterior;Left Stage 2 -  Partial thickness loss of dermis presenting as a shallow open injury with a red, pink wound bed without slough. 2cm x 1cm x 0cm - scabbed on top (Active)  04/09/24 1700  Location: Foot  Location Orientation: Anterior;Left  Staging: Stage 2 -  Partial thickness loss of dermis presenting as a shallow open injury with a red, pink wound bed without slough.  Wound Description (Comments): 2cm x 1cm x 0cm - scabbed on top  Present on Admission: Yes  Dressing Type Foam - Lift dressing to assess site every shift 04/09/24 2000     Pressure Injury  04/09/24 Ankle Anterior;Right Stage 2 -  Partial thickness loss of dermis presenting as a shallow open injury with a red, pink wound bed without slough. 1cm x 1cm x0 cm (Active)  04/09/24 1700  Location: Ankle  Location Orientation: Anterior;Right  Staging: Stage 2 -  Partial thickness loss of dermis presenting as a shallow open injury with a red, pink wound bed without slough.  Wound Description (Comments): 1cm x 1cm x0 cm  Present on Admission: Yes     Pressure Injury 04/09/24 Leg Left;Lateral Stage 2 -  Partial thickness loss of dermis presenting as a shallow open injury with a red, pink wound bed without slough. 3 separate areas - each stg 2 - minimal serosanguinous drainage, periwound area pink (Active)  04/09/24 1700  Location: Leg  Location Orientation: Left;Lateral  Staging: Stage 2 -  Partial thickness loss of dermis presenting as a shallow open injury with a red, pink wound bed without slough.  Wound Description (Comments): 3 separate areas - each stg 2 - minimal serosanguinous drainage, periwound area pink  Present on Admission: Yes  Dressing Type Foam - Lift dressing to assess site every shift 04/09/24 2000    Consultants:  ID  Procedures:  Bedside debridement 5/29  Antimicrobials:  Anti-infectives (From admission, onward)    Start     Dose/Rate Route Frequency Ordered Stop   04/11/24 1000  vancomycin (VANCOREADY) IVPB 750 mg/150 mL        750 mg 150 mL/hr over 60 Minutes Intravenous Every 48 hours 04/09/24 2039     04/10/24 1200  cefTRIAXone (ROCEPHIN) 2 g in sodium chloride  0.9 % 100 mL IVPB        2 g 200 mL/hr over 30 Minutes Intravenous Every 24 hours 04/09/24 1911     04/09/24 2200  nirmatrelvir/ritonavir (renal dosing) (PAXLOVID) 2 tablet  Status:  Discontinued        2 tablet Oral 2 times daily 04/09/24 1808 04/11/24 0821   04/09/24 1345  ceFEPIme (MAXIPIME) 2 g in sodium chloride  0.9 % 100 mL IVPB        2 g 200 mL/hr over 30 Minutes Intravenous  Once  04/09/24 1331 04/09/24 1513   04/09/24 1345  metroNIDAZOLE (FLAGYL) IVPB 500 mg        500 mg 100 mL/hr over 60 Minutes Intravenous  Once 04/09/24 1331 04/09/24 1514   04/09/24 1345  vancomycin (VANCOCIN) IVPB 1000 mg/200 mL premix        1,000 mg 200 mL/hr over 60 Minutes Intravenous  Once 04/09/24 1331 04/09/24 1614  Subjective: Patient seen and evaluated today with no new acute complaints or concerns. No acute concerns or events noted overnight.  She continues to have poor appetite and therefore poor oral intake.  Denies any pain.  Objective: Vitals:   04/10/24 2013 04/11/24 0435 04/11/24 1300 04/11/24 1400  BP: (!) 81/44 (!) 118/53  (!) 91/49  Pulse: 71 72  71  Resp: 14 15 17    Temp: 97.6 F (36.4 C) 98.2 F (36.8 C)  97.7 F (36.5 C)  TempSrc: Oral Oral Oral Oral  SpO2: 96% 94%  92%  Weight:      Height:        Intake/Output Summary (Last 24 hours) at 04/11/2024 1441 Last data filed at 04/11/2024 1227 Gross per 24 hour  Intake 360 ml  Output 600 ml  Net -240 ml   Filed Weights   04/09/24 2000  Weight: 41.9 kg    Examination:  General exam: Appears calm and comfortable, appears frail Respiratory system: Clear to auscultation. Respiratory effort normal. Cardiovascular system: S1 & S2 heard, RRR.  Gastrointestinal system: Abdomen is soft Central nervous system: Alert and awake Extremities: No edema Skin: No significant lesions noted Psychiatry: Flat affect.    Data Reviewed: I have personally reviewed following labs and imaging studies  CBC: Recent Labs  Lab 04/09/24 1310 04/10/24 0459 04/11/24 0825  WBC 7.7 6.8 6.7  NEUTROABS 5.4  --   --   HGB 12.3 9.4* 8.8*  HCT 39.0 28.5* 27.2*  MCV 107.1* 105.9* 107.5*  PLT 177 145* 142*   Basic Metabolic Panel: Recent Labs  Lab 04/09/24 1310 04/10/24 0459 04/11/24 0825  NA 137 136 137  K 4.7 3.6 3.8  CL 96* 99 96*  CO2 27 27 30   GLUCOSE 120* 72 62*  BUN 23 17 15   CREATININE 1.25* 0.96 1.09*   CALCIUM 9.0 8.1* 8.1*  MG  --   --  1.5*   GFR: Estimated Creatinine Clearance: 24.5 mL/min (A) (by C-G formula based on SCr of 1.09 mg/dL (H)). Liver Function Tests: Recent Labs  Lab 04/09/24 1310  AST 59*  ALT 33  ALKPHOS 98  BILITOT 1.2  PROT 7.0  ALBUMIN 3.0*   No results for input(s): "LIPASE", "AMYLASE" in the last 168 hours. No results for input(s): "AMMONIA" in the last 168 hours. Coagulation Profile: No results for input(s): "INR", "PROTIME" in the last 168 hours. Cardiac Enzymes: No results for input(s): "CKTOTAL", "CKMB", "CKMBINDEX", "TROPONINI" in the last 168 hours. BNP (last 3 results) No results for input(s): "PROBNP" in the last 8760 hours. HbA1C: No results for input(s): "HGBA1C" in the last 72 hours. CBG: No results for input(s): "GLUCAP" in the last 168 hours. Lipid Profile: No results for input(s): "CHOL", "HDL", "LDLCALC", "TRIG", "CHOLHDL", "LDLDIRECT" in the last 72 hours. Thyroid  Function Tests: No results for input(s): "TSH", "T4TOTAL", "FREET4", "T3FREE", "THYROIDAB" in the last 72 hours. Anemia Panel: No results for input(s): "VITAMINB12", "FOLATE", "FERRITIN", "TIBC", "IRON", "RETICCTPCT" in the last 72 hours. Sepsis Labs: Recent Labs  Lab 04/09/24 1310 04/09/24 1520  LATICACIDVEN 1.3 1.4    Recent Results (from the past 240 hours)  Blood Culture (routine x 2)     Status: None (Preliminary result)   Collection Time: 04/09/24  1:10 PM   Specimen: BLOOD  Result Value Ref Range Status   Specimen Description BLOOD RIGHT ARM  Final   Special Requests   Final    BOTTLES DRAWN AEROBIC AND ANAEROBIC Blood Culture results may not be  optimal due to an inadequate volume of blood received in culture bottles   Culture   Final    NO GROWTH < 24 HOURS Performed at [facility], [address], [place], [place] [address]    Report Status PENDING  Incomplete  Blood Culture (routine x 2)     Status: None (Preliminary result)   Collection Time:  04/09/24  1:13 PM   Specimen: BLOOD  Result Value Ref Range Status   Specimen Description BLOOD LEFT ARM  Final   Special Requests   Final    BOTTLES DRAWN AEROBIC AND ANAEROBIC Blood Culture adequate volume   Culture   Final    NO GROWTH < 24 HOURS Performed at [facility], [address], [place], [place] [address]    Report Status PENDING  Incomplete  Resp panel by RT-PCR (RSV, Flu A&B, Covid) Anterior Nasal Swab     Status: Abnormal   Collection Time: 04/09/24  1:54 PM   Specimen: Anterior Nasal Swab  Result Value Ref Range Status   SARS Coronavirus 2 by RT PCR POSITIVE (A) NEGATIVE Final    Comment: (NOTE) SARS-CoV-2 target nucleic acids are DETECTED.  The SARS-CoV-2 RNA is generally detectable in upper respiratory specimens during the acute phase of infection. Positive results are indicative of the presence of the identified virus, but do not rule out bacterial infection or co-infection with other pathogens not detected by the test. Clinical correlation with patient history and other diagnostic information is necessary to determine patient infection status. The expected result is Negative.  Fact Sheet for Patients: BloggerCourse.com  Fact Sheet for Healthcare Providers: SeriousBroker.it  This test is not yet approved or cleared by the [place]  FDA and  has been authorized for detection and/or diagnosis of SARS-CoV-2 by FDA under an Emergency Use Authorization (EUA).  This EUA will remain in effect (meaning this test can be used) for the duration of  the COVID-19 declaration under Section 564(b)(1) of the A ct, 21 U.S.C. section 360bbb-3(b)(1), unless the authorization is terminated or revoked sooner.     Influenza A by PCR NEGATIVE NEGATIVE Final   Influenza B by PCR NEGATIVE NEGATIVE Final    Comment: (NOTE) The Xpert Xpress SARS-CoV-2/FLU/RSV plus assay is intended as an aid in the diagnosis of influenza from  Nasopharyngeal swab specimens and should not be used as a sole basis for treatment. Nasal washings and aspirates are unacceptable for Xpert Xpress SARS-CoV-2/FLU/RSV testing.  Fact Sheet for Patients: BloggerCourse.com  Fact Sheet for Healthcare Providers: SeriousBroker.it  This test is not yet approved or cleared by the [place]  FDA and has been authorized for detection and/or diagnosis of SARS-CoV-2 by FDA under an Emergency Use Authorization (EUA). This EUA will remain in effect (meaning this test can be used) for the duration of the COVID-19 declaration under Section 564(b)(1) of the Act, 21 U.S.C. section 360bbb-3(b)(1), unless the authorization is terminated or revoked.     Resp Syncytial Virus by PCR NEGATIVE NEGATIVE Final    Comment: (NOTE) Fact Sheet for Patients: BloggerCourse.com  Fact Sheet for Healthcare Providers: SeriousBroker.it  This test is not yet approved or cleared by the [place]  FDA and has been authorized for detection and/or diagnosis of SARS-CoV-2 by FDA under an Emergency Use Authorization (EUA). This EUA will remain in effect (meaning this test can be used) for the duration of the COVID-19 declaration under Section 564(b)(1) of the Act, 21 U.S.C. section 360bbb-3(b)(1), unless the authorization is terminated or revoked.  Performed  at [facility], [address] [place], [place] [address]          Radiology Studies: DG Sacrum/Coccyx Result Date: 04/09/2024 CLINICAL DATA:  [id number] Decubitus ulcer of sacral region 245036 EXAM: SACRUM AND COCCYX - 2+ VIEW COMPARISON:  Right x-ray pelvis 11/16/2014 FINDINGS: Limited evaluation due to overlapping osseous structures and overlying soft tissues. There is no evidence of fracture or other focal bone lesions. Total left hip arthroplasty. Atherosclerotic plaque. IMPRESSION: Limited evaluation due to  overlapping osseous structures and overlying soft tissues. Total left hip arthroplasty. Electronically Signed   By: Morgane  Naveau M.D.   On: 04/09/2024 19:52        Scheduled Meds:  enoxaparin  (LOVENOX ) injection  30 mg Subcutaneous Q24H   feeding supplement  237 mL Oral BID BM   pantoprazole   40 mg Oral Daily   pramipexole   0.25 mg Oral QHS   pregabalin   100 mg Oral BID   Continuous Infusions:  cefTRIAXone  (ROCEPHIN )  IV 2 g (04/11/24 1143)   vancomycin  750 mg (04/11/24 0842)     LOS: 2 days    Time spent: 55 minutes    Bertha Earwood D Mason Sole, DO [facility]  If 7PM-7AM, please contact night-coverage [email or web address] 04/11/2024, 2:41 PM

## 2024-04-11 NOTE — Care Management Important Message (Signed)
 Important Message  Patient Details  Name: Anne Anthony MRN: [id number] Date of Birth: 07/05/1936   Important Message Given:  Yes - Medicare IM (reviewed letter with daughter Frankey Isle in room)     Steffan Caniglia L Gordan Grell 04/11/2024, 2:08 PM

## 2024-04-11 NOTE — Progress Notes (Signed)
 Patient medicated early per patient and POA request as she sleeps so deeply she becomes aspiration risk during night

## 2024-04-11 NOTE — Progress Notes (Signed)
 Physical Therapy Treatment Patient Details Name: Anne Anthony MRN: [id number] DOB: Mar 17, 1936 Today's Date: 04/11/2024   History of Present Illness Anne Anthony is a 88 y.o. female with medical history significant for atrial flutter, hypertension, diastolic CHF, tachybradycardia syndrome with pacemaker, CKD, restrictive lung disease, depression.  Patient was brought to the ED with reports of progressive weakness, and wound to her lower back.     Patient was hospitalized at [facility]- 4/9 to 4/17 for acute metabolic encephalopathy secondary to pneumonia.  She had jerking movements during hospitalization, neurology was consulted, EEG was completed but Keppra was not explicitly recommended.  To follow-up with neurology as outpatient.     Weakness started just prior to hospitalization at [facility], and has progressed.  Prior to hospitalization, patient lived alone, was able to ambulate, now she is lies in bed, requiring transfer.  She had a pressure ulcer that started during that hospitalization.  Family reports wound has been draining for a while, but appears deeper and back, with a foul smell.  Due to reports of mild confusion, patient had UA done- 5/23 as outpatient, was diagnosed with UTI, due to the holiday, antibiotics were not prescribed.  Patient and family deny dysuria or change in urinary habits.  Family reports onset of cough here in the ED.  No fevers at home.  No difficulty breathing.  At baseline she has no memory problems, and mental status is intact.    PT Comments  Patient had difficulty moving legs when rolling to side and sitting up from side lying position due to weakness and excessive left hip internal rotation, once seated able to maintain sitting balance while completing BLE exercises, unable to dorsiflex ankles due to heel cord tightness, tolerated standing with RW but limited for taking side steps due to constant posterior leaning with loss of balance.  Patient tolerated  sitting up in chair after therapy with daughter present. Patient will benefit from continued skilled physical therapy in hospital and recommended venue below to increase strength, balance, endurance for safe ADLs and gait.      If plan is discharge home, recommend the following: A lot of help with bathing/dressing/bathroom;A lot of help with walking and/or transfers;Help with stairs or ramp for entrance;Assistance with cooking/housework   Can travel by private vehicle     No  Equipment Recommendations  None recommended by PT    Recommendations for Other Services       Precautions / Restrictions Precautions Precautions: Fall Recall of Precautions/Restrictions: Impaired Precaution/Restrictions Comments: pressure sore over sacrum Restrictions Weight Bearing Restrictions Per Provider Order: No     Mobility  Bed Mobility   Bed Mobility: Rolling, Sidelying to Sit Rolling: Mod assist Sidelying to sit: Mod assist, Max assist       General bed mobility comments: increased time, labored movement    Transfers Overall transfer level: Needs assistance Equipment used: Rolling walker (2 wheels) Transfers: Sit to/from Stand, Bed to chair/wheelchair/BSC Sit to Stand: Mod assist   Step pivot transfers: Mod assist, Max assist       General transfer comment: poor standing balance, buckling of knees, posterior leaning    Ambulation/Gait Ambulation/Gait assistance: Max assist Gait Distance (Feet): 3 Feet Assistive device: Rolling walker (2 wheels) Gait Pattern/deviations: Decreased step length - right, Decreased step length - left, Decreased stride length, Shuffle, Knees buckling Gait velocity: slow     General Gait Details: limited to a few side steps with constant posterior leaning mostly due to bilateral heel cord  contractures   Stairs             Wheelchair Mobility     Tilt Bed    Modified Rankin (Stroke Patients Only)       Balance Overall balance  assessment: Needs assistance Sitting-balance support: Feet supported, No upper extremity supported Sitting balance-Leahy Scale: Poor Sitting balance - Comments: fair/poor seated at EOB   Standing balance support: Reliant on assistive device for balance, Bilateral upper extremity supported Standing balance-Leahy Scale: Poor Standing balance comment: using RW                            Communication Communication Factors Affecting Communication: Difficulty expressing self  Cognition Arousal: Alert Behavior During Therapy: WFL for tasks assessed/performed   PT - Cognitive impairments: No apparent impairments                         Following commands: Impaired Following commands impaired: Follows one step commands with increased time    Cueing Cueing Techniques: Verbal cues, Tactile cues  Exercises General Exercises - Lower Extremity Long Arc Quad: Seated, AROM, Strengthening, Both, 10 reps Hip Flexion/Marching: Seated, AROM, Strengthening, Both, 10 reps Heel Raises: Seated, AROM, Strengthening, Both, 10 reps    General Comments        Pertinent Vitals/Pain Pain Assessment Pain Assessment: Faces Faces Pain Scale: Hurts little more Pain Location: over sacral area with pressure Pain Descriptors / Indicators: Sore, Guarding, Grimacing Pain Intervention(s): Limited activity within patient's tolerance, Monitored during session, Repositioned    Home Living                          Prior Function            PT Goals (current goals can now be found in the care plan section) Acute Rehab PT Goals Patient Stated Goal: return home with family to assist PT Goal Formulation: With patient/family Time For Goal Achievement: 04/24/24 Potential to Achieve Goals: Fair Progress towards PT goals: Progressing toward goals    Frequency    Min 3X/week      PT Plan      Co-evaluation              AM-PAC PT "6 Clicks" Mobility   Outcome  Measure  Help needed turning from your back to your side while in a flat bed without using bedrails?: A Lot Help needed moving from lying on your back to sitting on the side of a flat bed without using bedrails?: A Lot Help needed moving to and from a bed to a chair (including a wheelchair)?: A Lot Help needed standing up from a chair using your arms (e.g., wheelchair or bedside chair)?: A Lot Help needed to walk in hospital room?: A Lot Help needed climbing 3-5 steps with a railing? : Total 6 Click Score: 11    End of Session   Activity Tolerance: Patient tolerated treatment well;Patient limited by fatigue Patient left: in chair;with call bell/phone within reach;with family/visitor present Nurse Communication: Mobility status PT Visit Diagnosis: Unsteadiness on feet (R26.81);Other abnormalities of gait and mobility (R26.89);Muscle weakness (generalized) (M62.81)     Time: 1610-9604 PT Time Calculation (min) (ACUTE ONLY): 32 min  Charges:    $Therapeutic Exercise: 8-22 mins $Therapeutic Activity: 8-22 mins PT General Charges $$ ACUTE PT VISIT: 1 Visit  2:36 PM, 04/11/24 [name], MPT Physical Therapist with [facility] [phone] office [phone] mobile phone

## 2024-04-12 DIAGNOSIS — S31000A Unspecified open wound of lower back and pelvis without penetration into retroperitoneum, initial encounter: Secondary | ICD-10-CM | POA: Diagnosis not present

## 2024-04-12 MED ORDER — MORPHINE SULFATE (CONCENTRATE) 20 MG/ML PO SOLN: 10.0000 mg | ORAL | 0 refills | Status: DC | PRN

## 2024-04-12 MED ORDER — LINEZOLID 600 MG PO TABS: 600.0000 mg | ORAL_TABLET | Freq: Two times a day (BID) | ORAL | 0 refills | Status: AC

## 2024-04-12 MED ORDER — AMOXICILLIN-POT CLAVULANATE 875-125 MG PO TABS: 1.0000 | ORAL_TABLET | Freq: Two times a day (BID) | ORAL | 0 refills | Status: AC

## 2024-04-12 MED ORDER — LORAZEPAM 2 MG/ML PO CONC: 1.0000 mg | ORAL | 0 refills | Status: DC | PRN

## 2024-04-12 NOTE — Discharge Summary (Signed)
 Physician Discharge Summary  Anne Anthony:[id number] DOB: 09-29-1936 DOA: 04/09/2024  PCP: Minus Amel, MD  Admit date: 04/09/2024  Discharge date: 04/12/2024  Admitted From: Home  Disposition: Home with hospice  Recommendations for Outpatient Follow-up:  Follow up with hospice agency Remain on Augmentin and linezolid as prescribed per ID recommendations for possible sacral osteomyelitis Morphine  and Ativan  concentrated solutions provided for hospice care Continue other medications as noted below  Home Health: With hospice services  Equipment/Devices: Requires air mattress  Discharge Condition:Stable  CODE STATUS: DNR  Diet recommendation: Pleasure feeds  Brief/Interim Summary: Anne Anthony is a 88 y.o. female with medical history significant for atrial flutter, hypertension, diastolic CHF, tachybradycardia syndrome with pacemaker, CKD, restrictive lung disease, depression brought into the hospital with progressive weakness and wound to her lower back.  Of note patient was recently admitted to [facility] hospital between [id number] for acute metabolic encephalopathy secondary to pneumonia.  Had involuntary movements at the time and EEG was done with plans for outpatient neurology follow-up.  Weakness had started prior to hospitalization at [facility] and has been progressively getting worse.  Recently has been lying in bed requiring assistance with transfers but at baseline patient used to live alone and was able to ambulate by herself.  Has had a pressure ulcer that was noted during hospitalization.  It has been draining some recently.  In the ED patient was afebrile.  Patient was tested positive for COVID.  Lactic acid was normal.  Chest x-ray showed left greater than right bibasilar atelectasis.  Urinalysis not suggestive of UTI.  Blood cultures were obtained and patient received IV fluids.  Patient underwent bedside debridement of the unstageable sacral ulcer on 5/29.  ID recommending  treatment of possible osteomyelitis with Augmentin as well as linezolid which have been prescribed.  Family members are now electing for home hospice services and this has been arranged.  No other acute events or concerns noted during this admission.  Discharge Diagnoses:  Principal Problem:   Sacral wound Active Problems:   COVID-19 virus infection   Generalized weakness   Persistent atrial fibrillation (HCC)   Tachycardia-bradycardia syndrome (HCC)   Essential hypertension   Pacemaker   Restrictive lung disease   Depression   Decubitus ulcer of sacral region, stage 4 (HCC)  Principal discharge diagnosis: Sacral pressure ulcer with possible early osteomyelitis status post debridement.  COVID-19 infection with generalized weakness.  Failure to thrive.  Discharge Instructions  Discharge Instructions     Diet - low sodium heart healthy   Complete by: As directed    Discharge wound care:   Complete by: As directed    Cleanse sacral wound with NS and pat dry. Apply Dakins moist gauze x 3 days to soften slough. Daily.   Increase activity slowly   Complete by: As directed       Allergies as of 04/12/2024       Reactions   Morphine  And Codeine Other (See Comments)   Hallucination        Medication List     STOP taking these medications    furosemide  40 MG tablet Commonly known as: LASIX    metoprolol  succinate 25 MG 24 hr tablet Commonly known as: TOPROL -XL   potassium chloride  SA 20 MEQ tablet Commonly known as: KLOR-CON  M   pravastatin  40 MG tablet Commonly known as: PRAVACHOL    trimethoprim  100 MG tablet Commonly known as: TRIMPEX        TAKE these medications    acetaminophen   325 MG tablet Commonly known as: TYLENOL  Take 1-2 tablets (325-650 mg total) by mouth every 4 (four) hours as needed for fever, headache or mild pain. What changed:  how much to take when to take this   amoxicillin-clavulanate 875-125 MG tablet Commonly known as:  AUGMENTIN Take 1 tablet by mouth 2 (two) times daily for 14 days.   dronabinol 2.5 MG capsule Commonly known as: MARINOL Take 2.5 mg by mouth as needed (chemo-induced nausea/vomiting, appetite stimulant).   linezolid 600 MG tablet Commonly known as: ZYVOX Take 1 tablet (600 mg total) by mouth 2 (two) times daily for 10 days.   LORazepam  2 MG/ML concentrated solution Commonly known as: ATIVAN  Take 0.5 mLs (1 mg total) by mouth every 4 (four) hours as needed for anxiety.   Lumigan 0.01 % Soln Generic drug: bimatoprost Place 1 drop into both eyes at bedtime.   meclizine  25 MG tablet Commonly known as: ANTIVERT  Take 25 mg by mouth every 6 (six) hours as needed for dizziness.   morphine  20 MG/ML concentrated solution Commonly known as: ROXANOL Take 0.5 mLs (10 mg total) by mouth every 2 (two) hours as needed for severe pain (pain score 7-10), breakthrough pain, anxiety or shortness of breath.   pramipexole  0.25 MG tablet Commonly known as: MIRAPEX  Take 0.25 mg by mouth at bedtime.   pregabalin  100 MG capsule Commonly known as: LYRICA  Take 100 mg by mouth 2 (two) times daily.   promethazine  25 MG tablet Commonly known as: PHENERGAN  Take 25 mg by mouth 2 (two) times daily as needed for nausea or vomiting.   RABEprazole 20 MG tablet Commonly known as: ACIPHEX Take 20 mg by mouth daily.   Santyl 250 UNIT/GM ointment Generic drug: collagenase Apply 1 Application topically daily.   Vitamin D  (Ergocalciferol ) 1.25 MG (50000 UNIT) Caps capsule Commonly known as: DRISDOL Take 50,000 Units by mouth every 7 (seven) days.               Durable Medical Equipment  (From admission, onward)           Start     Ordered   04/12/24 1020  For home use only DME Air overlay mattress  Once        04/12/24 1019              Discharge Care Instructions  (From admission, onward)           Start     Ordered   04/12/24 0000  Discharge wound care:       Comments:  Cleanse sacral wound with NS and pat dry. Apply Dakins moist gauze x 3 days to soften slough. Daily.   04/12/24 1040            Follow-up Information     Minus Amel, MD .   Specialty: Family Medicine Contact information: [address] [place] [address] [phone]                Allergies  Allergen Reactions   Morphine  And Codeine Other (See Comments)    Hallucination    Consultations: ID General Surgery   Procedures/Studies: DG Sacrum/Coccyx Result Date: 04/09/2024 CLINICAL DATA:  [id number] Decubitus ulcer of sacral region [id number] EXAM: SACRUM AND COCCYX - 2+ VIEW COMPARISON:  Right x-ray pelvis 11/16/2014 FINDINGS: Limited evaluation due to overlapping osseous structures and overlying soft tissues. There is no evidence of fracture or other focal bone lesions. Total left hip arthroplasty. Atherosclerotic plaque. IMPRESSION: Limited evaluation due  to overlapping osseous structures and overlying soft tissues. Total left hip arthroplasty. Electronically Signed   By: Morgane  Naveau M.D.   On: 04/09/2024 19:52   DG Chest Port 1 View Result Date: 04/09/2024 CLINICAL DATA:  Concern for sepsis.  Weakness. EXAM: PORTABLE CHEST 1 VIEW COMPARISON:  09/30/2023. FINDINGS: Similar large hiatal or diaphragmatic hernia is again noted containing and thoracic stomach and loops of bowel. Stable cardiomediastinal contours. Stable left chest wall pacemaker. Aortic atherosclerosis. Left greater than right bibasilar atelectasis. No focal consolidation, sizeable pleural effusion, or pneumothorax. Diffuse osseous demineralization. No acute osseous abnormality. IMPRESSION: 1. Similar large hiatal or diaphragmatic hernia. 2. Left greater than right bibasilar atelectasis. Electronically Signed   By: Mannie Seek M.D.   On: 04/09/2024 15:26     Discharge Exam: Vitals:   04/11/24 2350 04/12/24 0325  BP: (!) 98/55 (!) 112/44  Pulse: 71 72  Resp: 14 14  Temp: 98.4 F (36.9 C) 98  F (36.7 C)  SpO2: 90% 91%   Vitals:   04/11/24 1528 04/11/24 1931 04/11/24 2350 04/12/24 0325  BP: (!) 103/46 (!) 106/52 (!) 98/55 (!) 112/44  Pulse: 73 78 71 72  Resp:  15 14 14   Temp: 97.6 F (36.4 C) 98.8 F (37.1 C) 98.4 F (36.9 C) 98 F (36.7 C)  TempSrc: Oral Oral Skin Oral  SpO2: 90% 96% 90% 91%  Weight:      Height:        General: Pt is alert, awake, not in acute distress Cardiovascular: RRR, S1/S2 +, no rubs, no gallops Respiratory: CTA bilaterally, no wheezing, no rhonchi Abdominal: Soft, NT, ND, bowel sounds + Extremities: no edema, no cyanosis    The results of significant diagnostics from this hospitalization (including imaging, microbiology, ancillary and laboratory) are listed below for reference.     Microbiology: Recent Results (from the past 240 hours)  Blood Culture (routine x 2)     Status: None (Preliminary result)   Collection Time: 04/09/24  1:10 PM   Specimen: BLOOD  Result Value Ref Range Status   Specimen Description BLOOD RIGHT ARM  Final   Special Requests   Final    BOTTLES DRAWN AEROBIC AND ANAEROBIC Blood Culture results may not be optimal due to an inadequate volume of blood received in culture bottles   Culture   Final    NO GROWTH 3 DAYS Performed at [facility], [address] [place], [place] [address]    Report Status PENDING  Incomplete  Blood Culture (routine x 2)     Status: None (Preliminary result)   Collection Time: 04/09/24  1:13 PM   Specimen: BLOOD  Result Value Ref Range Status   Specimen Description BLOOD LEFT ARM  Final   Special Requests   Final    BOTTLES DRAWN AEROBIC AND ANAEROBIC Blood Culture adequate volume   Culture   Final    NO GROWTH 3 DAYS Performed at [facility], [address], [place], [place] [address]    Report Status PENDING  Incomplete  Resp panel by RT-PCR (RSV, Flu A&B, Covid) Anterior Nasal Swab     Status: Abnormal   Collection Time: 04/09/24  1:54 PM   Specimen: Anterior Nasal Swab   Result Value Ref Range Status   SARS Coronavirus 2 by RT PCR POSITIVE (A) NEGATIVE Final    Comment: (NOTE) SARS-CoV-2 target nucleic acids are DETECTED.  The SARS-CoV-2 RNA is generally detectable in upper respiratory specimens during the acute phase of infection. Positive  results are indicative of the presence of the identified virus, but do not rule out bacterial infection or co-infection with other pathogens not detected by the test. Clinical correlation with patient history and other diagnostic information is necessary to determine patient infection status. The expected result is Negative.  Fact Sheet for Patients: BloggerCourse.com  Fact Sheet for Healthcare Providers: SeriousBroker.it  This test is not yet approved or cleared by the [place]  FDA and  has been authorized for detection and/or diagnosis of SARS-CoV-2 by FDA under an Emergency Use Authorization (EUA).  This EUA will remain in effect (meaning this test can be used) for the duration of  the COVID-19 declaration under Section 564(b)(1) of the A ct, 21 U.S.C. section 360bbb-3(b)(1), unless the authorization is terminated or revoked sooner.     Influenza A by PCR NEGATIVE NEGATIVE Final   Influenza B by PCR NEGATIVE NEGATIVE Final    Comment: (NOTE) The Xpert Xpress SARS-CoV-2/FLU/RSV plus assay is intended as an aid in the diagnosis of influenza from Nasopharyngeal swab specimens and should not be used as a sole basis for treatment. Nasal washings and aspirates are unacceptable for Xpert Xpress SARS-CoV-2/FLU/RSV testing.  Fact Sheet for Patients: BloggerCourse.com  Fact Sheet for Healthcare Providers: SeriousBroker.it  This test is not yet approved or cleared by the [place]  FDA and has been authorized for detection and/or diagnosis of SARS-CoV-2 by FDA under an Emergency Use Authorization (EUA).  This EUA will remain in effect (meaning this test can be used) for the duration of the COVID-19 declaration under Section 564(b)(1) of the Act, 21 U.S.C. section 360bbb-3(b)(1), unless the authorization is terminated or revoked.     Resp Syncytial Virus by PCR NEGATIVE NEGATIVE Final    Comment: (NOTE) Fact Sheet for Patients: BloggerCourse.com  Fact Sheet for Healthcare Providers: SeriousBroker.it  This test is not yet approved or cleared by the [place]  FDA and has been authorized for detection and/or diagnosis of SARS-CoV-2 by FDA under an Emergency Use Authorization (EUA). This EUA will remain in effect (meaning this test can be used) for the duration of the COVID-19 declaration under Section 564(b)(1) of the Act, 21 U.S.C. section 360bbb-3(b)(1), unless the authorization is terminated or revoked.  Performed at [facility], [address], [place], [place] [address]      Labs: BNP (last 3 results) Recent Labs    05/03/23 2225  BNP 638.3*   Basic Metabolic Panel: Recent Labs  Lab 04/09/24 1310 04/10/24 0459 04/11/24 0825  NA 137 136 137  K 4.7 3.6 3.8  CL 96* 99 96*  CO2 27 27 30   GLUCOSE 120* 72 62*  BUN 23 17 15   CREATININE 1.25* 0.96 1.09*  CALCIUM 9.0 8.1* 8.1*  MG  --   --  1.5*   Liver Function Tests: Recent Labs  Lab 04/09/24 1310  AST 59*  ALT 33  ALKPHOS 98  BILITOT 1.2  PROT 7.0  ALBUMIN 3.0*   No results for input(s): "LIPASE", "AMYLASE" in the last 168 hours. No results for input(s): "AMMONIA" in the last 168 hours. CBC: Recent Labs  Lab 04/09/24 1310 04/10/24 0459 04/11/24 0825  WBC 7.7 6.8 6.7  NEUTROABS 5.4  --   --   HGB 12.3 9.4* 8.8*  HCT 39.0 28.5* 27.2*  MCV 107.1* 105.9* 107.5*  PLT 177 145* 142*   Cardiac Enzymes: No results for input(s): "CKTOTAL", "CKMB", "CKMBINDEX", "TROPONINI" in the last 168 hours. BNP: Invalid input(s): "POCBNP" CBG: No results for  input(s): "  GLUCAP" in the last 168 hours. D-Dimer No results for input(s): "DDIMER" in the last 72 hours. Hgb A1c No results for input(s): "HGBA1C" in the last 72 hours. Lipid Profile No results for input(s): "CHOL", "HDL", "LDLCALC", "TRIG", "CHOLHDL", "LDLDIRECT" in the last 72 hours. Thyroid  function studies No results for input(s): "TSH", "T4TOTAL", "T3FREE", "THYROIDAB" in the last 72 hours.  Invalid input(s): "FREET3" Anemia work up No results for input(s): "VITAMINB12", "FOLATE", "FERRITIN", "TIBC", "IRON", "RETICCTPCT" in the last 72 hours. Urinalysis    Component Value Date/Time   COLORURINE YELLOW 04/09/2024 1407   APPEARANCEUR CLEAR 04/09/2024 1407   LABSPEC 1.013 04/09/2024 1407   PHURINE 5.0 04/09/2024 1407   GLUCOSEU NEGATIVE 04/09/2024 1407   HGBUR NEGATIVE 04/09/2024 1407   BILIRUBINUR NEGATIVE 04/09/2024 1407   KETONESUR 5 (A) 04/09/2024 1407   PROTEINUR NEGATIVE 04/09/2024 1407   UROBILINOGEN 1.0 02/23/2015 2005   NITRITE NEGATIVE 04/09/2024 1407   LEUKOCYTESUR NEGATIVE 04/09/2024 1407   Sepsis Labs Recent Labs  Lab 04/09/24 1310 04/10/24 0459 04/11/24 0825  WBC 7.7 6.8 6.7   Microbiology Recent Results (from the past 240 hours)  Blood Culture (routine x 2)     Status: None (Preliminary result)   Collection Time: 04/09/24  1:10 PM   Specimen: BLOOD  Result Value Ref Range Status   Specimen Description BLOOD RIGHT ARM  Final   Special Requests   Final    BOTTLES DRAWN AEROBIC AND ANAEROBIC Blood Culture results may not be optimal due to an inadequate volume of blood received in culture bottles   Culture   Final    NO GROWTH 3 DAYS Performed at [facility], [address] [place], [place] [address]    Report Status PENDING  Incomplete  Blood Culture (routine x 2)     Status: None (Preliminary result)   Collection Time: 04/09/24  1:13 PM   Specimen: BLOOD  Result Value Ref Range Status   Specimen Description BLOOD LEFT ARM  Final   Special  Requests   Final    BOTTLES DRAWN AEROBIC AND ANAEROBIC Blood Culture adequate volume   Culture   Final    NO GROWTH 3 DAYS Performed at [facility], [address], [place], [place] [address]    Report Status PENDING  Incomplete  Resp panel by RT-PCR (RSV, Flu A&B, Covid) Anterior Nasal Swab     Status: Abnormal   Collection Time: 04/09/24  1:54 PM   Specimen: Anterior Nasal Swab  Result Value Ref Range Status   SARS Coronavirus 2 by RT PCR POSITIVE (A) NEGATIVE Final    Comment: (NOTE) SARS-CoV-2 target nucleic acids are DETECTED.  The SARS-CoV-2 RNA is generally detectable in upper respiratory specimens during the acute phase of infection. Positive results are indicative of the presence of the identified virus, but do not rule out bacterial infection or co-infection with other pathogens not detected by the test. Clinical correlation with patient history and other diagnostic information is necessary to determine patient infection status. The expected result is Negative.  Fact Sheet for Patients: BloggerCourse.com  Fact Sheet for Healthcare Providers: SeriousBroker.it  This test is not yet approved or cleared by the [place]  FDA and  has been authorized for detection and/or diagnosis of SARS-CoV-2 by FDA under an Emergency Use Authorization (EUA).  This EUA will remain in effect (meaning this test can be used) for the duration of  the COVID-19 declaration under Section 564(b)(1) of the A ct, 21 U.S.C. section 360bbb-3(b)(1), unless the authorization  is terminated or revoked sooner.     Influenza A by PCR NEGATIVE NEGATIVE Final   Influenza B by PCR NEGATIVE NEGATIVE Final    Comment: (NOTE) The Xpert Xpress SARS-CoV-2/FLU/RSV plus assay is intended as an aid in the diagnosis of influenza from Nasopharyngeal swab specimens and should not be used as a sole basis for treatment. Nasal washings and aspirates are  unacceptable for Xpert Xpress SARS-CoV-2/FLU/RSV testing.  Fact Sheet for Patients: BloggerCourse.com  Fact Sheet for Healthcare Providers: SeriousBroker.it  This test is not yet approved or cleared by the [place]  FDA and has been authorized for detection and/or diagnosis of SARS-CoV-2 by FDA under an Emergency Use Authorization (EUA). This EUA will remain in effect (meaning this test can be used) for the duration of the COVID-19 declaration under Section 564(b)(1) of the Act, 21 U.S.C. section 360bbb-3(b)(1), unless the authorization is terminated or revoked.     Resp Syncytial Virus by PCR NEGATIVE NEGATIVE Final    Comment: (NOTE) Fact Sheet for Patients: BloggerCourse.com  Fact Sheet for Healthcare Providers: SeriousBroker.it  This test is not yet approved or cleared by the [place]  FDA and has been authorized for detection and/or diagnosis of SARS-CoV-2 by FDA under an Emergency Use Authorization (EUA). This EUA will remain in effect (meaning this test can be used) for the duration of the COVID-19 declaration under Section 564(b)(1) of the Act, 21 U.S.C. section 360bbb-3(b)(1), unless the authorization is terminated or revoked.  Performed at [facility], [address], [place], [place] [address]      Time coordinating discharge: 35 minutes  SIGNED:   Cornelius Dill, DO [facility] 04/12/2024, 10:59 AM  If 7PM-7AM, please contact night-coverage [email or web address]

## 2024-04-12 NOTE — Plan of Care (Signed)
  Problem: Education: Goal: Knowledge of risk factors and measures for prevention of condition will improve Outcome: Not Progressing   Problem: Education: Goal: Knowledge of General Education information will improve Description: Including pain rating scale, medication(s)/side effects and non-pharmacologic comfort measures Outcome: Not Progressing   Problem: Health Behavior/Discharge Planning: Goal: Ability to manage health-related needs will improve Outcome: Not Progressing   Problem: Clinical Measurements: Goal: Ability to maintain clinical measurements within normal limits will improve Outcome: Not Progressing

## 2024-04-12 NOTE — Progress Notes (Signed)
 Nsg Discharge Note  Admit Date:  04/09/2024 Discharge date: 04/12/2024   Anne Anthony to be D/C'd Home per MD order.  AVS completed.  Copy for chart, and copy for patient signed, and dated. Patient/caregiver able to verbalize understanding.  Discharge Medication: Allergies as of 04/12/2024       Reactions   Morphine  And Codeine Other (See Comments)   Hallucination        Medication List     STOP taking these medications    furosemide  40 MG tablet Commonly known as: LASIX    metoprolol  succinate 25 MG 24 hr tablet Commonly known as: TOPROL -XL   potassium chloride  SA 20 MEQ tablet Commonly known as: KLOR-CON  M   pravastatin  40 MG tablet Commonly known as: PRAVACHOL    trimethoprim  100 MG tablet Commonly known as: TRIMPEX        TAKE these medications    acetaminophen  325 MG tablet Commonly known as: TYLENOL  Take 1-2 tablets (325-650 mg total) by mouth every 4 (four) hours as needed for fever, headache or mild pain. What changed:  how much to take when to take this   amoxicillin-clavulanate 875-125 MG tablet Commonly known as: AUGMENTIN Take 1 tablet by mouth 2 (two) times daily for 14 days.   dronabinol 2.5 MG capsule Commonly known as: MARINOL Take 2.5 mg by mouth as needed (chemo-induced nausea/vomiting, appetite stimulant).   linezolid 600 MG tablet Commonly known as: ZYVOX Take 1 tablet (600 mg total) by mouth 2 (two) times daily for 10 days.   LORazepam  2 MG/ML concentrated solution Commonly known as: ATIVAN  Take 0.5 mLs (1 mg total) by mouth every 4 (four) hours as needed for anxiety.   Lumigan 0.01 % Soln Generic drug: bimatoprost Place 1 drop into both eyes at bedtime.   meclizine  25 MG tablet Commonly known as: ANTIVERT  Take 25 mg by mouth every 6 (six) hours as needed for dizziness.   morphine  20 MG/ML concentrated solution Commonly known as: ROXANOL Take 0.5 mLs (10 mg total) by mouth every 2 (two) hours as needed for severe pain (pain  score 7-10), breakthrough pain, anxiety or shortness of breath.   pramipexole  0.25 MG tablet Commonly known as: MIRAPEX  Take 0.25 mg by mouth at bedtime.   pregabalin  100 MG capsule Commonly known as: LYRICA  Take 100 mg by mouth 2 (two) times daily.   promethazine  25 MG tablet Commonly known as: PHENERGAN  Take 25 mg by mouth 2 (two) times daily as needed for nausea or vomiting.   RABEprazole 20 MG tablet Commonly known as: ACIPHEX Take 20 mg by mouth daily.   Santyl 250 UNIT/GM ointment Generic drug: collagenase Apply 1 Application topically daily.   Vitamin D  (Ergocalciferol ) 1.25 MG (50000 UNIT) Caps capsule Commonly known as: DRISDOL Take 50,000 Units by mouth every 7 (seven) days.               Durable Medical Equipment  (From admission, onward)           Start     Ordered   04/12/24 1020  For home use only DME Air overlay mattress  Once        04/12/24 1019              Discharge Care Instructions  (From admission, onward)           Start     Ordered   04/12/24 0000  Discharge wound care:       Comments: Cleanse sacral wound with NS and pat  dry. Apply Dakins moist gauze x 3 days to soften slough. Daily.   04/12/24 1040            Discharge Assessment: Vitals:   04/11/24 2350 04/12/24 0325  BP: (!) 98/55 (!) 112/44  Pulse: 71 72  Resp: 14 14  Temp: 98.4 F (36.9 C) 98 F (36.7 C)  SpO2: 90% 91%   Skin clean, dry and intact without evidence of skin break down, no evidence of skin tears noted. IV catheter discontinued intact. Site without signs and symptoms of complications - no redness or edema noted at insertion site, patient denies c/o pain - only slight tenderness at site.  Dressing with slight pressure applied.  D/c Instructions-Education: Discharge instructions given to patient/family with verbalized understanding. D/c education completed with patient/family including follow up instructions, medication list, d/c activities  limitations if indicated, with other d/c instructions as indicated by MD - patient able to verbalize understanding, all questions fully answered. Patient instructed to return to ED, call 911, or call MD for any changes in condition.  Patient escorted via WC, and D/C home via private auto.  Darrell Else, RN 04/12/2024 1:42 PM

## 2024-04-12 NOTE — Progress Notes (Signed)
 Pts daughters bedside and was taught how to dress pts wounds. They both expressed understanding.

## 2024-04-14 LAB — CULTURE, BLOOD (ROUTINE X 2)
Culture: NO GROWTH
Culture: NO GROWTH
Special Requests: ADEQUATE

## 2024-05-13 DEATH — deceased

## 2024-05-27 ENCOUNTER — Ambulatory Visit (HOSPITAL_BASED_OUTPATIENT_CLINIC_OR_DEPARTMENT_OTHER): Admitting: Internal Medicine

## 2024-06-12 ENCOUNTER — Ambulatory Visit: Payer: Self-pay | Admitting: Cardiovascular Disease

## 2024-07-16 ENCOUNTER — Ambulatory Visit (HOSPITAL_COMMUNITY): Admitting: Internal Medicine
